# Patient Record
Sex: Female | Born: 1935
Health system: Southern US, Community
[De-identification: ages and names within clinical notes are randomized; demographics above are authoritative.]

## PROBLEM LIST (undated history)

## (undated) DIAGNOSIS — K5900 Constipation, unspecified: Secondary | ICD-10-CM

## (undated) DIAGNOSIS — I499 Cardiac arrhythmia, unspecified: Secondary | ICD-10-CM

## (undated) DIAGNOSIS — S72002A Fracture of unspecified part of neck of left femur, initial encounter for closed fracture: Secondary | ICD-10-CM

## (undated) DIAGNOSIS — E559 Vitamin D deficiency, unspecified: Secondary | ICD-10-CM

## (undated) DIAGNOSIS — F0391 Unspecified dementia with behavioral disturbance: Secondary | ICD-10-CM

## (undated) DIAGNOSIS — R32 Unspecified urinary incontinence: Secondary | ICD-10-CM

## (undated) DIAGNOSIS — N183 Chronic kidney disease, stage 3 unspecified: Secondary | ICD-10-CM

## (undated) DIAGNOSIS — Z8781 Personal history of (healed) traumatic fracture: Secondary | ICD-10-CM

## (undated) DIAGNOSIS — I482 Chronic atrial fibrillation, unspecified: Secondary | ICD-10-CM

## (undated) DIAGNOSIS — G9340 Encephalopathy, unspecified: Secondary | ICD-10-CM

## (undated) DIAGNOSIS — S72009A Fracture of unspecified part of neck of unspecified femur, initial encounter for closed fracture: Secondary | ICD-10-CM

## (undated) DIAGNOSIS — I10 Essential (primary) hypertension: Secondary | ICD-10-CM

## (undated) DIAGNOSIS — E785 Hyperlipidemia, unspecified: Secondary | ICD-10-CM

## (undated) DIAGNOSIS — E039 Hypothyroidism, unspecified: Secondary | ICD-10-CM

## (undated) DIAGNOSIS — R410 Disorientation, unspecified: Secondary | ICD-10-CM

## (undated) DIAGNOSIS — G4700 Insomnia, unspecified: Secondary | ICD-10-CM

## (undated) DIAGNOSIS — M810 Age-related osteoporosis without current pathological fracture: Secondary | ICD-10-CM

## (undated) DIAGNOSIS — Z8659 Personal history of other mental and behavioral disorders: Secondary | ICD-10-CM

## (undated) DIAGNOSIS — I639 Cerebral infarction, unspecified: Secondary | ICD-10-CM

## (undated) DIAGNOSIS — F329 Major depressive disorder, single episode, unspecified: Secondary | ICD-10-CM

## (undated) DIAGNOSIS — M1990 Unspecified osteoarthritis, unspecified site: Secondary | ICD-10-CM

## (undated) HISTORY — DX: Hyperlipidemia, unspecified: E78.5

## (undated) HISTORY — PX: THYROID SURGERY: SHX805

## (undated) HISTORY — DX: Unspecified osteoarthritis, unspecified site: M19.90

## (undated) HISTORY — DX: Insomnia, unspecified: G47.00

## (undated) HISTORY — DX: Cardiac arrhythmia, unspecified: I49.9

## (undated) HISTORY — DX: Hypothyroidism, unspecified: E03.9

## (undated) HISTORY — DX: Essential (primary) hypertension: I10

## (undated) HISTORY — DX: Fracture of unspecified part of neck of unspecified femur, initial encounter for closed fracture: S72.009A

## (undated) HISTORY — DX: Encephalopathy, unspecified: G93.40

## (undated) HISTORY — DX: Constipation, unspecified: K59.00

## (undated) HISTORY — DX: Chronic kidney disease, stage 3 unspecified: N18.30

## (undated) HISTORY — DX: Major depressive disorder, single episode, unspecified: F32.9

## (undated) HISTORY — DX: Disorientation, unspecified: R41.0

## (undated) HISTORY — DX: Unspecified dementia with behavioral disturbance: F03.91

## (undated) HISTORY — DX: Age-related osteoporosis without current pathological fracture: M81.0

## (undated) HISTORY — DX: Chronic kidney disease, stage 3 (moderate): N18.3

## (undated) HISTORY — DX: Chronic atrial fibrillation, unspecified: I48.20

## (undated) HISTORY — DX: Fracture of unspecified part of neck of left femur, initial encounter for closed fracture: S72.002A

## (undated) HISTORY — DX: Unspecified urinary incontinence: R32

## (undated) HISTORY — DX: Vitamin D deficiency, unspecified: E55.9

## (undated) HISTORY — DX: Cerebral infarction, unspecified: I63.9

## (undated) HISTORY — PX: APPENDECTOMY: SHX54

## (undated) HISTORY — PX: KNEE ARTHROSCOPY: SUR90

## (undated) HISTORY — DX: Personal history of other mental and behavioral disorders: Z86.59

---

## 2008-08-22 HISTORY — PX: COLONOSCOPY: SHX174

## 2012-12-11 ENCOUNTER — Ambulatory Visit (INDEPENDENT_AMBULATORY_CARE_PROVIDER_SITE_OTHER): Payer: Medicare Other | Admitting: Nurse Practitioner

## 2012-12-11 ENCOUNTER — Encounter: Payer: Self-pay | Admitting: Nurse Practitioner

## 2012-12-11 VITALS — BP 110/70 | HR 92 | Temp 98.9°F | Ht 61.16 in | Wt 128.5 lb

## 2012-12-11 DIAGNOSIS — Z8659 Personal history of other mental and behavioral disorders: Secondary | ICD-10-CM | POA: Insufficient documentation

## 2012-12-11 DIAGNOSIS — I10 Essential (primary) hypertension: Secondary | ICD-10-CM

## 2012-12-11 DIAGNOSIS — H6690 Otitis media, unspecified, unspecified ear: Secondary | ICD-10-CM

## 2012-12-11 DIAGNOSIS — H6691 Otitis media, unspecified, right ear: Secondary | ICD-10-CM

## 2012-12-11 DIAGNOSIS — I499 Cardiac arrhythmia, unspecified: Secondary | ICD-10-CM

## 2012-12-11 HISTORY — DX: Personal history of other mental and behavioral disorders: Z86.59

## 2012-12-11 MED ORDER — AMOXICILLIN 875 MG PO TABS: 875.0000 mg | ORAL_TABLET | Freq: Two times a day (BID) | ORAL | Status: DC

## 2012-12-11 NOTE — Progress Notes (Signed)
Pre-visit discussion using our clinic review tool. No additional management support is needed unless otherwise documented below in the visit note.  

## 2012-12-11 NOTE — Patient Instructions (Addendum)
Start antibiotic. Start daily sinus rinses (Neilmed sinus rinse). This will help cough. Eat yogurt every day that you are taking medicine. This will help prevent diarrhea. Sip fluids every hour:water, juice, herbal tea, colorless soda. Feel better!  Otitis Media, Adult A middle ear infection is an infection in the space behind the eardrum. The medical name for this is "otitis media." It may happen after a common cold. It is caused by a germ that starts growing in that space. You may feel swollen glands in your neck on the side of the ear infection. HOME CARE INSTRUCTIONS   Take your medicine as directed until it is gone, even if you feel better after the first few days.  Only take over-the-counter or prescription medicines for pain, discomfort, or fever as directed by your caregiver.  Occasional use of a nasal decongestant a couple times per day may help with discomfort and help the eustachian tube to drain better. Follow up with your caregiver in 10 to 14 days or as directed, to be certain that the infection has cleared. Not keeping the appointment could result in a chronic or permanent injury, pain, hearing loss and disability. If there is any problem keeping the appointment, you must call back to this facility for assistance. SEEK IMMEDIATE MEDICAL CARE IF:   You are not getting better in 2 to 3 days.  You have pain that is not controlled with medication.  You feel worse instead of better.  You cannot use the medication as directed.  You develop swelling, redness or pain around the ear or stiffness in your neck. MAKE SURE YOU:   Understand these instructions.  Will watch your condition.  Will get help right away if you are not doing well or get worse. Document Released: 09/29/2003 Document Revised: 03/18/2011 Document Reviewed: 07/21/2012 [facility] Patient Information 2014 [place], [place].

## 2012-12-13 DIAGNOSIS — I10 Essential (primary) hypertension: Secondary | ICD-10-CM | POA: Insufficient documentation

## 2012-12-13 DIAGNOSIS — R32 Unspecified urinary incontinence: Secondary | ICD-10-CM | POA: Insufficient documentation

## 2012-12-13 DIAGNOSIS — E039 Hypothyroidism, unspecified: Secondary | ICD-10-CM | POA: Insufficient documentation

## 2012-12-13 DIAGNOSIS — I499 Cardiac arrhythmia, unspecified: Secondary | ICD-10-CM | POA: Insufficient documentation

## 2012-12-13 HISTORY — DX: Cardiac arrhythmia, unspecified: I49.9

## 2012-12-13 HISTORY — DX: Unspecified urinary incontinence: R32

## 2012-12-13 NOTE — Progress Notes (Signed)
   Subjective:    Patient ID: Marie Robertson, female    DOB: July 20, 1920, 79 y.o.   MRN: [id number]  HPI Comments: Marie Robertson lives in [place], [place]. She is visiting her son for the holidays. He provided transportation for her today. She has a PCP & cardiologist whom she sees regularly at home. She is here to establish care as she has been feeling poorly the last week with nasal congestion, fatigue, and has developed ear pain in the last few days.  Otalgia  There is pain in the right ear. This is a new problem. The current episode started 1 to 4 weeks ago (nasal congestion & cough for 1 week, ear pain for few days, fatigue for several days.). The problem occurs constantly. The problem has been gradually worsening. There has been no fever. The pain is moderate. Associated symptoms include coughing (productive), drainage, headaches and neck pain (anterior, points to lymph nodes). Pertinent negatives include no abdominal pain, diarrhea, ear discharge, rash, sore throat or vomiting. Treatments tried: coricidin. The treatment provided mild relief.      Review of Systems  Constitutional: Positive for activity change (visiting son for several weeks. been in bed for few days w/fatigue), appetite change (reports Hx of depression w/decreased appetite over several months) and fatigue. Negative for fever, chills and diaphoresis.  HENT: Positive for congestion, ear pain, postnasal drip and sinus pressure. Negative for ear discharge, sore throat, trouble swallowing and voice change.   Eyes: Negative for redness.  Respiratory: Positive for cough (productive). Negative for chest tightness, shortness of breath and wheezing.   Gastrointestinal: Negative for vomiting, abdominal pain and diarrhea.  Musculoskeletal: Positive for neck pain (anterior, points to lymph nodes). Negative for back pain.  Skin: Negative for rash.  Neurological: Positive for headaches.  Hematological: Positive for adenopathy.      Objective:   Physical Exam  Vitals reviewed. Constitutional: She is oriented to person, place, and time. She appears well-developed and well-nourished. No distress.  HENT:  Head: Normocephalic and atraumatic.  Right Ear: External ear normal. Tympanic membrane is injected and bulging.  Left Ear: Tympanic membrane and external ear normal.  Mouth/Throat: Oropharynx is clear and moist. No oropharyngeal exudate.  Eyes: Conjunctivae are normal. Right eye exhibits no discharge. Left eye exhibits no discharge.  Neck: Normal range of motion. Neck supple. No thyromegaly present.  Cardiovascular: Normal rate.   No murmur heard. irreg rhythm. Pt has known arrythmia, she is on atenolol & pradaxa. She is not symptomatic: denies cp, SOB, palpitations, dizziness.  Pulmonary/Chest: Effort normal and breath sounds normal. No respiratory distress. She has no wheezes.  Lymphadenopathy:    She has no cervical adenopathy.  Neurological: She is alert and oriented to person, place, and time.  Skin: Skin is warm and dry.  Psychiatric: She has a normal mood and affect. Her behavior is normal. Thought content normal.          Assessment & Plan:   1. Otitis media, right Nasal congestion, postnasal drip, prod cough, no CP or wheezing - amoxicillin (AMOXIL) 875 MG tablet; Take 1 tablet (875 mg total) by mouth 2 (two) times daily.  Dispense: 20 tablet; Refill: 0  2. Essential hypertension, benign Well-controlled in ofc, Taking triamterene/HCTZ prescribed by provider in [facility].  3. Cardiac arrhythmia, stable Rhythm is irreg in ofc by auscultation. Pt denies symptoms of SOB, palpitations, lightheadedness. Taking atenolol & pradaxa. Sees cardiologist in [place], [place].

## 2012-12-22 ENCOUNTER — Telehealth: Payer: Self-pay | Admitting: Nurse Practitioner

## 2012-12-23 NOTE — Telephone Encounter (Signed)
LMOVM for patient return call.

## 2012-12-30 NOTE — Telephone Encounter (Signed)
Patient stated that she finished her antibiotic and that she is better much better.

## 2014-01-25 DIAGNOSIS — Z7901 Long term (current) use of anticoagulants: Secondary | ICD-10-CM | POA: Diagnosis not present

## 2014-01-31 DIAGNOSIS — I10 Essential (primary) hypertension: Secondary | ICD-10-CM | POA: Diagnosis not present

## 2014-01-31 DIAGNOSIS — M25531 Pain in right wrist: Secondary | ICD-10-CM | POA: Diagnosis not present

## 2014-01-31 DIAGNOSIS — S61419A Laceration without foreign body of unspecified hand, initial encounter: Secondary | ICD-10-CM | POA: Diagnosis not present

## 2014-01-31 DIAGNOSIS — B999 Unspecified infectious disease: Secondary | ICD-10-CM | POA: Diagnosis not present

## 2014-01-31 DIAGNOSIS — S6991XA Unspecified injury of right wrist, hand and finger(s), initial encounter: Secondary | ICD-10-CM | POA: Diagnosis not present

## 2014-01-31 DIAGNOSIS — M79641 Pain in right hand: Secondary | ICD-10-CM | POA: Diagnosis not present

## 2014-01-31 DIAGNOSIS — M7989 Other specified soft tissue disorders: Secondary | ICD-10-CM | POA: Diagnosis not present

## 2014-01-31 DIAGNOSIS — Z23 Encounter for immunization: Secondary | ICD-10-CM | POA: Diagnosis not present

## 2014-01-31 DIAGNOSIS — R296 Repeated falls: Secondary | ICD-10-CM | POA: Diagnosis not present

## 2014-02-04 DIAGNOSIS — B999 Unspecified infectious disease: Secondary | ICD-10-CM | POA: Diagnosis not present

## 2014-02-04 DIAGNOSIS — L570 Actinic keratosis: Secondary | ICD-10-CM | POA: Diagnosis not present

## 2014-02-04 DIAGNOSIS — T1490 Injury, unspecified: Secondary | ICD-10-CM | POA: Diagnosis not present

## 2014-02-08 DIAGNOSIS — D485 Neoplasm of uncertain behavior of skin: Secondary | ICD-10-CM | POA: Diagnosis not present

## 2014-02-08 DIAGNOSIS — L821 Other seborrheic keratosis: Secondary | ICD-10-CM | POA: Diagnosis not present

## 2014-02-08 DIAGNOSIS — C44329 Squamous cell carcinoma of skin of other parts of face: Secondary | ICD-10-CM | POA: Diagnosis not present

## 2014-04-25 DIAGNOSIS — Z7901 Long term (current) use of anticoagulants: Secondary | ICD-10-CM | POA: Diagnosis not present

## 2014-05-07 DIAGNOSIS — Z96651 Presence of right artificial knee joint: Secondary | ICD-10-CM | POA: Diagnosis not present

## 2014-05-07 DIAGNOSIS — S0990XA Unspecified injury of head, initial encounter: Secondary | ICD-10-CM | POA: Diagnosis not present

## 2014-05-07 DIAGNOSIS — S0083XA Contusion of other part of head, initial encounter: Secondary | ICD-10-CM | POA: Diagnosis not present

## 2014-05-07 DIAGNOSIS — S41122A Laceration with foreign body of left upper arm, initial encounter: Secondary | ICD-10-CM | POA: Diagnosis not present

## 2014-05-07 DIAGNOSIS — S81812A Laceration without foreign body, left lower leg, initial encounter: Secondary | ICD-10-CM | POA: Diagnosis not present

## 2014-05-07 DIAGNOSIS — Z7901 Long term (current) use of anticoagulants: Secondary | ICD-10-CM | POA: Diagnosis not present

## 2014-05-07 DIAGNOSIS — S81802A Unspecified open wound, left lower leg, initial encounter: Secondary | ICD-10-CM | POA: Diagnosis not present

## 2014-05-07 DIAGNOSIS — S41102A Unspecified open wound of left upper arm, initial encounter: Secondary | ICD-10-CM | POA: Diagnosis not present

## 2014-05-07 DIAGNOSIS — I10 Essential (primary) hypertension: Secondary | ICD-10-CM | POA: Diagnosis not present

## 2014-05-07 DIAGNOSIS — S0003XA Contusion of scalp, initial encounter: Secondary | ICD-10-CM | POA: Diagnosis not present

## 2014-05-23 DIAGNOSIS — M7989 Other specified soft tissue disorders: Secondary | ICD-10-CM | POA: Diagnosis not present

## 2014-05-30 DIAGNOSIS — Z7901 Long term (current) use of anticoagulants: Secondary | ICD-10-CM | POA: Diagnosis not present

## 2014-06-16 DIAGNOSIS — Z7901 Long term (current) use of anticoagulants: Secondary | ICD-10-CM | POA: Diagnosis not present

## 2014-06-30 DIAGNOSIS — Z7901 Long term (current) use of anticoagulants: Secondary | ICD-10-CM | POA: Diagnosis not present

## 2014-07-20 DIAGNOSIS — Z7901 Long term (current) use of anticoagulants: Secondary | ICD-10-CM | POA: Diagnosis not present

## 2014-08-11 DIAGNOSIS — Z7901 Long term (current) use of anticoagulants: Secondary | ICD-10-CM | POA: Diagnosis not present

## 2014-09-01 DIAGNOSIS — Z7901 Long term (current) use of anticoagulants: Secondary | ICD-10-CM | POA: Diagnosis not present

## 2014-09-16 DIAGNOSIS — Z7901 Long term (current) use of anticoagulants: Secondary | ICD-10-CM | POA: Diagnosis not present

## 2014-11-07 DIAGNOSIS — C44329 Squamous cell carcinoma of skin of other parts of face: Secondary | ICD-10-CM | POA: Diagnosis not present

## 2014-11-07 DIAGNOSIS — Z23 Encounter for immunization: Secondary | ICD-10-CM | POA: Diagnosis not present

## 2014-11-10 DIAGNOSIS — Z23 Encounter for immunization: Secondary | ICD-10-CM | POA: Diagnosis not present

## 2014-12-16 DIAGNOSIS — Z79899 Other long term (current) drug therapy: Secondary | ICD-10-CM | POA: Diagnosis not present

## 2014-12-16 DIAGNOSIS — I4891 Unspecified atrial fibrillation: Secondary | ICD-10-CM | POA: Diagnosis not present

## 2014-12-16 DIAGNOSIS — E7800 Pure hypercholesterolemia, unspecified: Secondary | ICD-10-CM | POA: Diagnosis not present

## 2014-12-16 DIAGNOSIS — E039 Hypothyroidism, unspecified: Secondary | ICD-10-CM | POA: Diagnosis not present

## 2014-12-16 DIAGNOSIS — I10 Essential (primary) hypertension: Secondary | ICD-10-CM | POA: Diagnosis not present

## 2014-12-16 DIAGNOSIS — Z7901 Long term (current) use of anticoagulants: Secondary | ICD-10-CM | POA: Diagnosis not present

## 2015-01-01 ENCOUNTER — Encounter (HOSPITAL_BASED_OUTPATIENT_CLINIC_OR_DEPARTMENT_OTHER): Payer: Self-pay | Admitting: Emergency Medicine

## 2015-01-01 ENCOUNTER — Emergency Department (HOSPITAL_BASED_OUTPATIENT_CLINIC_OR_DEPARTMENT_OTHER)
Admission: EM | Admit: 2015-01-01 | Discharge: 2015-01-01 | Disposition: A | Discharge status: Home / Self Care | Payer: Medicare Other | Attending: Emergency Medicine | Admitting: Emergency Medicine

## 2015-01-01 DIAGNOSIS — Y998 Other external cause status: Secondary | ICD-10-CM | POA: Insufficient documentation

## 2015-01-01 DIAGNOSIS — Z8739 Personal history of other diseases of the musculoskeletal system and connective tissue: Secondary | ICD-10-CM | POA: Diagnosis not present

## 2015-01-01 DIAGNOSIS — E079 Disorder of thyroid, unspecified: Secondary | ICD-10-CM | POA: Diagnosis not present

## 2015-01-01 DIAGNOSIS — I10 Essential (primary) hypertension: Secondary | ICD-10-CM | POA: Insufficient documentation

## 2015-01-01 DIAGNOSIS — Z8759 Personal history of other complications of pregnancy, childbirth and the puerperium: Secondary | ICD-10-CM | POA: Insufficient documentation

## 2015-01-01 DIAGNOSIS — Z79899 Other long term (current) drug therapy: Secondary | ICD-10-CM | POA: Insufficient documentation

## 2015-01-01 DIAGNOSIS — Y9289 Other specified places as the place of occurrence of the external cause: Secondary | ICD-10-CM | POA: Insufficient documentation

## 2015-01-01 DIAGNOSIS — Y9389 Activity, other specified: Secondary | ICD-10-CM | POA: Insufficient documentation

## 2015-01-01 DIAGNOSIS — S51812A Laceration without foreign body of left forearm, initial encounter: Secondary | ICD-10-CM

## 2015-01-01 DIAGNOSIS — Z8619 Personal history of other infectious and parasitic diseases: Secondary | ICD-10-CM | POA: Insufficient documentation

## 2015-01-01 DIAGNOSIS — Z23 Encounter for immunization: Secondary | ICD-10-CM | POA: Diagnosis not present

## 2015-01-01 DIAGNOSIS — W548XXA Other contact with dog, initial encounter: Secondary | ICD-10-CM | POA: Insufficient documentation

## 2015-01-01 DIAGNOSIS — S51822D Laceration with foreign body of left forearm, subsequent encounter: Secondary | ICD-10-CM | POA: Diagnosis not present

## 2015-01-01 MED ORDER — TETANUS-DIPHTH-ACELL PERTUSSIS 5-2.5-18.5 LF-MCG/0.5 IM SUSP
0.5000 mL | Freq: Once | INTRAMUSCULAR | Status: AC
  Administered 2015-01-01: 0.5 mL via INTRAMUSCULAR
  Filled 2015-01-01: qty 0.5

## 2015-01-01 NOTE — Discharge Instructions (Signed)
Ms. Tim Sevy,  Nice meeting you! Please follow-up with your primary care provider as needed. Return to the emergency department if you develop fevers, chills, yellow/green drainage, redness in the area, continued bleeding, or if your symptoms worsen. Feel better soon!  S. Wendie Simmer, PA-C

## 2015-01-01 NOTE — ED Notes (Signed)
Pt d/c home with family.

## 2015-01-01 NOTE — ED Notes (Signed)
Pt states scratched arm last night with family dog. Pt on blood thinners and cant' keep it from bleeding

## 2015-01-01 NOTE — ED Notes (Signed)
Dressing applied to patient wound. Small clot forming on area. Skin tear is minimal but continues to ooze.

## 2015-01-15 NOTE — ED Provider Notes (Signed)
CSN: [id number]     Arrival date & time 01/01/15  1631 History   First MD Initiated Contact with Patient 01/01/15 1936     Chief Complaint  Patient presents with  . Extremity Laceration   HPI Marie Robertson is a 82 y.o. F PMH significant for HTN, arthritis presenting with a 1 day history of skin tear on her left forearm. She was scratched by the family dog and it has been bleeding intermittently since. She is on Pradaxa. No fevers, chills, CP, SOB, palpitations, dizziness, drainage/erythema at the wound site.   Past Medical History  Diagnosis Date  . Arthritis   . Depression   . Hypertension   . Arrhythmia   . Chicken pox   . Thyroid disease    Past Surgical History  Procedure Laterality Date  . Appendectomy    . Knee arthroscopy Right   . Thyroid surgery     No family history on file. Social History  Substance Use Topics  . Smoking status: Never Smoker   . Smokeless tobacco: Never Used  . Alcohol Use: No   OB History    No data available     Review of Systems   Ten systems are reviewed and are negative for acute change except as noted in the HPI   Allergies  Review of patient's allergies indicates no known allergies.  Home Medications   Prior to Admission medications   Medication Sig Start Date End Date Taking? Authorizing Provider  amoxicillin (AMOXIL) 875 MG tablet Take 1 tablet (875 mg total) by mouth 2 (two) times daily. 12/11/12   Irene Pap, NP  atenolol (TENORMIN) 50 MG tablet Take 50 mg by mouth daily.    Historical Provider, MD  dabigatran (PRADAXA) 75 MG CAPS capsule Take 75 mg by mouth 2 (two) times daily.    Historical Provider, MD  levothyroxine (SYNTHROID, LEVOTHROID) 100 MCG tablet Take 100 mcg by mouth daily before breakfast.    Historical Provider, MD  triamterene-hydrochlorothiazide (MAXZIDE) 75-50 MG per tablet Take 1 tablet by mouth daily.    Historical Provider, MD   BP 138/114 mmHg  Pulse 83  Temp(Src) 98.3 F (36.8 C) (Oral)  Resp  16  SpO2 100%  LMP 01/08/1972 Physical Exam  Constitutional: She appears well-developed and well-nourished. No distress.  HENT:  Head: Normocephalic and atraumatic.  Mouth/Throat: Oropharynx is clear and moist. No oropharyngeal exudate.  Eyes: Conjunctivae are normal. Pupils are equal, round, and reactive to light. Right eye exhibits no discharge. Left eye exhibits no discharge. No scleral icterus.  Neck: No tracheal deviation present.  Cardiovascular: Normal rate, regular rhythm, normal heart sounds and intact distal pulses.  Exam reveals no gallop and no friction rub.   No murmur heard. Pulmonary/Chest: Effort normal and breath sounds normal. No respiratory distress. She has no wheezes. She has no rales. She exhibits no tenderness.  Abdominal: Soft. Bowel sounds are normal. She exhibits no distension and no mass. There is no tenderness. There is no rebound and no guarding.  Musculoskeletal: She exhibits no edema.  Lymphadenopathy:    She has no cervical adenopathy.  Neurological: She is alert. Coordination normal.  Skin: Skin is warm and dry. No rash noted. She is not diaphoretic. No erythema.  2 cm skin tear dorsal aspect of left forearm. Bleeding controlled.   Psychiatric: She has a normal mood and affect. Her behavior is normal.  Nursing note and vitals reviewed.   ED Course  Procedures  The wound is cleansed,  debrided of foreign material as much as possible, and dressed. The patient is alerted to watch for any signs of infection (redness, pus, pain, increased swelling or fever) and call if such occurs. Home wound care instructions are provided. Tetanus vaccination status reviewed: tetanus status unknown to the patient.   MDM   Final diagnoses:  Skin tear of forearm without complication, left, initial encounter   Patient non-toxic appearing. Hypertensive on exam but patient did not take HTN medications today. Skin tear cleansed and dressed. Patient may be safely discharged  home. Discussed reasons for return. Patient to follow-up with primary care provider PRN. Patient in understanding and agreement with the plan.     [place], PA-C 01/15/15 [id number]  Quintella Reichert, MD 01/15/15 [phone]

## 2015-01-18 DIAGNOSIS — M25561 Pain in right knee: Secondary | ICD-10-CM | POA: Diagnosis not present

## 2015-01-18 DIAGNOSIS — M7989 Other specified soft tissue disorders: Secondary | ICD-10-CM | POA: Diagnosis not present

## 2015-01-18 DIAGNOSIS — M13861 Other specified arthritis, right knee: Secondary | ICD-10-CM | POA: Diagnosis not present

## 2015-01-18 DIAGNOSIS — M25461 Effusion, right knee: Secondary | ICD-10-CM | POA: Diagnosis not present

## 2015-01-18 DIAGNOSIS — R2689 Other abnormalities of gait and mobility: Secondary | ICD-10-CM | POA: Diagnosis not present

## 2015-01-18 DIAGNOSIS — Z7901 Long term (current) use of anticoagulants: Secondary | ICD-10-CM | POA: Diagnosis not present

## 2015-01-18 DIAGNOSIS — I10 Essential (primary) hypertension: Secondary | ICD-10-CM | POA: Diagnosis not present

## 2015-01-18 DIAGNOSIS — R791 Abnormal coagulation profile: Secondary | ICD-10-CM | POA: Diagnosis not present

## 2015-01-19 DIAGNOSIS — Z7902 Long term (current) use of antithrombotics/antiplatelets: Secondary | ICD-10-CM | POA: Diagnosis not present

## 2015-01-19 DIAGNOSIS — Z96651 Presence of right artificial knee joint: Secondary | ICD-10-CM | POA: Diagnosis not present

## 2015-01-19 DIAGNOSIS — M25461 Effusion, right knee: Secondary | ICD-10-CM | POA: Diagnosis not present

## 2015-01-19 DIAGNOSIS — N2589 Other disorders resulting from impaired renal tubular function: Secondary | ICD-10-CM | POA: Diagnosis not present

## 2015-01-19 DIAGNOSIS — T148 Other injury of unspecified body region: Secondary | ICD-10-CM | POA: Diagnosis not present

## 2015-01-19 DIAGNOSIS — I4891 Unspecified atrial fibrillation: Secondary | ICD-10-CM | POA: Diagnosis not present

## 2015-01-20 DIAGNOSIS — L03119 Cellulitis of unspecified part of limb: Secondary | ICD-10-CM | POA: Diagnosis not present

## 2015-01-20 DIAGNOSIS — E781 Pure hyperglyceridemia: Secondary | ICD-10-CM | POA: Diagnosis not present

## 2015-01-20 DIAGNOSIS — I10 Essential (primary) hypertension: Secondary | ICD-10-CM | POA: Diagnosis not present

## 2015-01-20 DIAGNOSIS — Z7901 Long term (current) use of anticoagulants: Secondary | ICD-10-CM | POA: Diagnosis not present

## 2015-01-20 DIAGNOSIS — E039 Hypothyroidism, unspecified: Secondary | ICD-10-CM | POA: Diagnosis not present

## 2015-01-23 DIAGNOSIS — K649 Unspecified hemorrhoids: Secondary | ICD-10-CM | POA: Diagnosis not present

## 2015-01-23 DIAGNOSIS — T148 Other injury of unspecified body region: Secondary | ICD-10-CM | POA: Diagnosis not present

## 2015-01-23 DIAGNOSIS — K5900 Constipation, unspecified: Secondary | ICD-10-CM | POA: Diagnosis not present

## 2015-01-23 DIAGNOSIS — R310 Gross hematuria: Secondary | ICD-10-CM | POA: Diagnosis not present

## 2015-01-23 DIAGNOSIS — R0602 Shortness of breath: Secondary | ICD-10-CM | POA: Diagnosis not present

## 2015-01-23 DIAGNOSIS — I517 Cardiomegaly: Secondary | ICD-10-CM | POA: Diagnosis not present

## 2015-01-23 DIAGNOSIS — R319 Hematuria, unspecified: Secondary | ICD-10-CM | POA: Diagnosis not present

## 2015-01-23 DIAGNOSIS — Z7901 Long term (current) use of anticoagulants: Secondary | ICD-10-CM | POA: Diagnosis not present

## 2015-02-19 ENCOUNTER — Encounter: Payer: Self-pay | Admitting: Family Medicine

## 2015-02-20 ENCOUNTER — Encounter: Payer: Self-pay | Admitting: Family Medicine

## 2015-02-20 ENCOUNTER — Ambulatory Visit (INDEPENDENT_AMBULATORY_CARE_PROVIDER_SITE_OTHER): Payer: Medicare Other | Admitting: Family Medicine

## 2015-02-20 VITALS — BP 136/84 | HR 90 | Temp 97.9°F | Resp 16 | Ht 61.0 in | Wt 122.8 lb

## 2015-02-20 DIAGNOSIS — I482 Chronic atrial fibrillation, unspecified: Secondary | ICD-10-CM

## 2015-02-20 DIAGNOSIS — H9193 Unspecified hearing loss, bilateral: Secondary | ICD-10-CM

## 2015-02-20 DIAGNOSIS — I10 Essential (primary) hypertension: Secondary | ICD-10-CM | POA: Diagnosis not present

## 2015-02-20 DIAGNOSIS — E785 Hyperlipidemia, unspecified: Secondary | ICD-10-CM

## 2015-02-20 DIAGNOSIS — B9789 Other viral agents as the cause of diseases classified elsewhere: Secondary | ICD-10-CM

## 2015-02-20 DIAGNOSIS — E039 Hypothyroidism, unspecified: Secondary | ICD-10-CM

## 2015-02-20 DIAGNOSIS — J069 Acute upper respiratory infection, unspecified: Secondary | ICD-10-CM

## 2015-02-20 NOTE — Progress Notes (Signed)
Office Note 02/20/2015  CC:  Chief Complaint  Patient presents with  . Establish Care    Pt is not fasting.     HPI:  Marie Robertson is a 82 y.o. White female who is here with her son to establish care. Patient's most recent primary MD: Dr. Loney Loh in [place], [place]. Old records (her last office note from previous PCP) were reviewed prior to or during today's visit.  She last saw her PCP in [place] on 01/23/15.  She had a skin tear wound on left LL at that time. Of note, was also seen in [place] ED for skin tear of forearm on 01/01/15. All skin tears have healed.  Has both 57mcg and 50 mcg levothyroxine tabs with her, ?been taking both?. Says she has been having no bleeding problems since switching over from warfarin to eliquis. She doesn't monitor bp at home at this time.  Has had URI sx's for about 2 wks, says sx's are improving some.  No fevers or SOB.  Looking back, she has seen our former NP here, Nicky Pugh, back in 12/2012 when pt was in town visiting her son for the holidays.  Past Medical History  Diagnosis Date  . Osteoarthritis   . Depression   . Hypertension   . Chronic atrial fibrillation ([place])   . Hypothyroidism   . Constipation   . Insomnia   . Hyperlipidemia   . Osteoporosis   . CVA (cerebral infarction) approx 2012    residual defect: poor R sided peripheral vision per son    Past Surgical History  Procedure Laterality Date  . Appendectomy    . Knee arthroscopy Right   . Thyroid surgery    . Colonoscopy  08/22/08    Family History  Problem Relation Age of Onset  . Stroke Mother   . Diabetes Mother   . Alcohol abuse Father     Social History   Social History  . Marital Status: Widowed    Spouse Name: N/A  . Number of Children: N/A  . Years of Education: N/A   Occupational History  . Not on file.   Social History Main Topics  . Smoking status: Never Smoker   . Smokeless tobacco: Never Used  . Alcohol Use:  No  . Drug Use: No  . Sexual Activity: Not on file   Other Topics Concern  . Not on file   Social History Narrative   Widow.   Educ: 11th grade.   Occupation: retired Insurance claims handler and retired Secretary/administrator.   As of 02/2015, pt is in independent living at [facility] in [place].   No T/A/Ds.    Outpatient Encounter Prescriptions as of 02/20/2015  Medication Sig  . atenolol (TENORMIN) 50 MG tablet Take 50 mg by mouth daily.  . Cholecalciferol (VITAMIN D-3) 1000 units CAPS Take 3 capsules by mouth daily.  Marland Kitchen ELIQUIS 2.5 MG TABS tablet Take 2.5 mg by mouth 2 (two) times daily.  Marland Kitchen levothyroxine (SYNTHROID, LEVOTHROID) 50 MCG tablet Take 50 mcg by mouth daily.  Marland Kitchen levothyroxine (SYNTHROID, LEVOTHROID) 75 MCG tablet Take 75 mcg by mouth daily.  . pravastatin (PRAVACHOL) 40 MG tablet Take 40 mg by mouth at bedtime.  Marland Kitchen zolpidem (AMBIEN) 5 MG tablet Take 5 mg by mouth daily.  . [DISCONTINUED] amoxicillin (AMOXIL) 875 MG tablet Take 1 tablet (875 mg total) by mouth 2 (two) times daily. (Patient not taking: Reported on 02/20/2015)  . [DISCONTINUED] dabigatran (PRADAXA) 75 MG CAPS  capsule Take 75 mg by mouth 2 (two) times daily. Reported on 02/20/2015  . [DISCONTINUED] levothyroxine (SYNTHROID, LEVOTHROID) 100 MCG tablet Take 100 mcg by mouth daily before breakfast. Reported on 02/20/2015  . [DISCONTINUED] triamterene-hydrochlorothiazide (MAXZIDE) 75-50 MG per tablet Take 1 tablet by mouth daily. Reported on 02/20/2015   No facility-administered encounter medications on file as of 02/20/2015.    No Known Allergies  ROS Review of Systems  Constitutional: Negative for fever and fatigue.  HENT: Negative for congestion and sore throat. Dental problem: needs some teeth checked.        Chronic bilat hearing deficit  Eyes: Negative for visual disturbance.  Respiratory: Negative for cough.   Cardiovascular: Negative for chest pain.  Gastrointestinal: Negative for nausea and abdominal pain.  Genitourinary:  Negative for dysuria.  Musculoskeletal: Negative for back pain and joint swelling.  Skin: Negative for rash.  Neurological: Negative for weakness and headaches.  Hematological: Negative for adenopathy.    PE; Blood pressure 136/84, pulse 90, temperature 97.9 F (36.6 C), temperature source Oral, resp. rate 16, height 5\' 1"  (1.549 m), weight 122 lb 12 oz (55.679 kg), last menstrual period 01/08/1972, SpO2 98 %.Manual bp repeat today 136/84 Gen: alert, NAD, NONTOXIC APPEARING. HEENT: eyes without injection, drainage, or swelling.  Ears: EACs show left side is clear, right side has mild amount of cerumen but nothing that would impact her hearing. TMs with normal light reflex and landmarks.  Nose: Clear rhinorrhea, with some dried, crusty exudate adherent to mildly injected mucosa.  No purulent d/c.  No paranasal sinus TTP.  No facial swelling.  Throat and mouth without focal lesion.  No pharyngial swelling, erythema, or exudate.   Neck: supple, no LAD.   LUNGS: CTA bilat, nonlabored resps.   CV: Irreg irreg rhythm, rate in the 80s, no m/r/g. ABD: soft, NT/ND EXT: no c/c/e SKIN: no rash  Pertinent labs:  None today  ASSESSMENT AND PLAN:   New pt; obtain prior PCP records.  1) Hypothyroidism; check TSH.  Sounds like she may have been confused about dosing so she has been taking both a 103mcg tab and a 75 mcg tab lately.  2) HTN: The current medical regimen is effective;  continue present plan and medications. Lytes/cr today.  3) Hyperlipidemia: not fasting currently.  Tolerating statin.  Checking AST/ALT today.  4) Chronic a-fib: rate control with tenormin and anticoag with eliquis going well. Pt asymptomatic.  5) Viral URI with cough--resolving currently.  6) Decreased hearing AU: refer to audiology.  An After Visit Summary was printed and given to the patient.  Return in about 3 months (around 05/20/2015) for routine chronic illness f/u.

## 2015-02-20 NOTE — Progress Notes (Signed)
Pre visit review using our clinic review tool, if applicable. No additional management support is needed unless otherwise documented below in the visit note. 

## 2015-02-21 LAB — COMPREHENSIVE METABOLIC PANEL
ALBUMIN: 3.8 g/dL (ref 3.5–5.2)
ALK PHOS: 67 U/L (ref 39–117)
ALT: 10 U/L (ref 0–35)
AST: 18 U/L (ref 0–37)
BUN: 39 mg/dL — ABNORMAL HIGH (ref 6–23)
CALCIUM: 9.2 mg/dL (ref 8.4–10.5)
CHLORIDE: 105 meq/L (ref 96–112)
CO2: 27 mEq/L (ref 19–32)
CREATININE: 1.67 mg/dL — AB (ref 0.40–1.20)
GFR: 30.44 mL/min — ABNORMAL LOW (ref >60.00)
Glucose, Bld: 131 mg/dL — ABNORMAL HIGH (ref 70–99)
POTASSIUM: 5.4 meq/L — AB (ref 3.5–5.1)
Sodium: 141 mEq/L (ref 135–145)
Total Bilirubin: 0.4 mg/dL (ref 0.2–1.2)
Total Protein: 6.7 g/dL (ref 6.0–8.3)

## 2015-02-21 LAB — CBC WITH DIFFERENTIAL/PLATELET
Basophils Absolute: 0 10*3/uL (ref 0.0–0.1)
Basophils Relative: 0.2 % (ref 0.0–3.0)
EOS PCT: 1.9 % (ref 0.0–5.0)
Eosinophils Absolute: 0.1 10*3/uL (ref 0.0–0.7)
HEMATOCRIT: 34.5 % — AB (ref 36.0–46.0)
Hemoglobin: 11.1 g/dL — ABNORMAL LOW (ref 12.0–15.0)
LYMPHS PCT: 23.7 % (ref 12.0–46.0)
Lymphs Abs: 1.3 10*3/uL (ref 0.7–4.0)
MCHC: 32.3 g/dL (ref 30.0–36.0)
MCV: 90.3 fl (ref 78.0–100.0)
MONOS PCT: 14.1 % — AB (ref 3.0–12.0)
Monocytes Absolute: 0.8 10*3/uL (ref 0.1–1.0)
Neutro Abs: 3.4 10*3/uL (ref 1.4–7.7)
Neutrophils Relative %: 60.1 % (ref 43.0–77.0)
PLATELETS: 242 10*3/uL (ref 150.0–400.0)
RBC: 3.82 Mil/uL — ABNORMAL LOW (ref 3.87–5.11)
RDW: 18.5 % — AB (ref 11.5–15.5)
WBC: 5.7 10*3/uL (ref 4.0–10.5)

## 2015-02-21 LAB — TSH: TSH: 0.1 u[IU]/mL — ABNORMAL LOW (ref 0.35–4.50)

## 2015-02-22 ENCOUNTER — Other Ambulatory Visit: Payer: Self-pay | Admitting: Family Medicine

## 2015-02-22 MED ORDER — LEVOTHYROXINE SODIUM 75 MCG PO TABS: 75.0000 ug | ORAL_TABLET | Freq: Every day | ORAL | Status: DC

## 2015-02-23 MED ORDER — PRAVASTATIN SODIUM 40 MG PO TABS: 40.0000 mg | ORAL_TABLET | Freq: Every day | ORAL | Status: DC

## 2015-02-23 NOTE — Addendum Note (Signed)
Addended by: Onalee Hua on: 02/23/2015 04:38 PM   Modules accepted: Orders

## 2015-03-03 ENCOUNTER — Other Ambulatory Visit: Payer: Self-pay | Admitting: Family Medicine

## 2015-03-03 NOTE — Telephone Encounter (Signed)
RF request for ambien LOV: 02/20/15 Next ov: 05/19/15 Last written: unknown  Please advise. Thanks.

## 2015-03-03 NOTE — Telephone Encounter (Signed)
Needs a refill on her Ambien 5 mg refilled. [facility]ses [facility] [address].

## 2015-03-05 MED ORDER — ZOLPIDEM TARTRATE 5 MG PO TABS: 5.0000 mg | ORAL_TABLET | Freq: Every day | ORAL | Status: DC

## 2015-03-06 NOTE — Telephone Encounter (Signed)
Rx faxed

## 2015-03-08 DIAGNOSIS — Z8781 Personal history of (healed) traumatic fracture: Secondary | ICD-10-CM

## 2015-03-08 HISTORY — DX: Personal history of (healed) traumatic fracture: Z87.81

## 2015-03-15 ENCOUNTER — Emergency Department (HOSPITAL_BASED_OUTPATIENT_CLINIC_OR_DEPARTMENT_OTHER): Payer: Medicare Other

## 2015-03-15 ENCOUNTER — Encounter (HOSPITAL_BASED_OUTPATIENT_CLINIC_OR_DEPARTMENT_OTHER): Payer: Self-pay | Admitting: Emergency Medicine

## 2015-03-15 ENCOUNTER — Emergency Department (HOSPITAL_BASED_OUTPATIENT_CLINIC_OR_DEPARTMENT_OTHER)
Admission: EM | Admit: 2015-03-15 | Discharge: 2015-03-15 | Disposition: A | Discharge status: Home / Self Care | Payer: Medicare Other | Attending: Emergency Medicine | Admitting: Emergency Medicine

## 2015-03-15 DIAGNOSIS — S22010A Wedge compression fracture of first thoracic vertebra, initial encounter for closed fracture: Secondary | ICD-10-CM | POA: Diagnosis not present

## 2015-03-15 DIAGNOSIS — G4700 Insomnia, unspecified: Secondary | ICD-10-CM | POA: Insufficient documentation

## 2015-03-15 DIAGNOSIS — I10 Essential (primary) hypertension: Secondary | ICD-10-CM | POA: Diagnosis not present

## 2015-03-15 DIAGNOSIS — R51 Headache: Secondary | ICD-10-CM | POA: Diagnosis not present

## 2015-03-15 DIAGNOSIS — W01198A Fall on same level from slipping, tripping and stumbling with subsequent striking against other object, initial encounter: Secondary | ICD-10-CM | POA: Insufficient documentation

## 2015-03-15 DIAGNOSIS — Z8719 Personal history of other diseases of the digestive system: Secondary | ICD-10-CM | POA: Diagnosis not present

## 2015-03-15 DIAGNOSIS — Y998 Other external cause status: Secondary | ICD-10-CM | POA: Diagnosis not present

## 2015-03-15 DIAGNOSIS — E782 Mixed hyperlipidemia: Secondary | ICD-10-CM | POA: Insufficient documentation

## 2015-03-15 DIAGNOSIS — Z8659 Personal history of other mental and behavioral disorders: Secondary | ICD-10-CM | POA: Diagnosis not present

## 2015-03-15 DIAGNOSIS — Z8739 Personal history of other diseases of the musculoskeletal system and connective tissue: Secondary | ICD-10-CM | POA: Diagnosis not present

## 2015-03-15 DIAGNOSIS — E039 Hypothyroidism, unspecified: Secondary | ICD-10-CM | POA: Diagnosis not present

## 2015-03-15 DIAGNOSIS — S199XXA Unspecified injury of neck, initial encounter: Secondary | ICD-10-CM | POA: Diagnosis not present

## 2015-03-15 DIAGNOSIS — Y9389 Activity, other specified: Secondary | ICD-10-CM | POA: Diagnosis not present

## 2015-03-15 DIAGNOSIS — S22019A Unspecified fracture of first thoracic vertebra, initial encounter for closed fracture: Secondary | ICD-10-CM

## 2015-03-15 DIAGNOSIS — Z8673 Personal history of transient ischemic attack (TIA), and cerebral infarction without residual deficits: Secondary | ICD-10-CM | POA: Insufficient documentation

## 2015-03-15 DIAGNOSIS — W19XXXA Unspecified fall, initial encounter: Secondary | ICD-10-CM

## 2015-03-15 DIAGNOSIS — S299XXA Unspecified injury of thorax, initial encounter: Secondary | ICD-10-CM | POA: Diagnosis not present

## 2015-03-15 DIAGNOSIS — Y9289 Other specified places as the place of occurrence of the external cause: Secondary | ICD-10-CM | POA: Insufficient documentation

## 2015-03-15 DIAGNOSIS — S0990XA Unspecified injury of head, initial encounter: Secondary | ICD-10-CM | POA: Diagnosis not present

## 2015-03-15 DIAGNOSIS — S3992XA Unspecified injury of lower back, initial encounter: Secondary | ICD-10-CM | POA: Diagnosis not present

## 2015-03-15 LAB — BASIC METABOLIC PANEL
ANION GAP: 8 (ref 5–15)
BUN: 29 mg/dL — ABNORMAL HIGH (ref 6–20)
CALCIUM: 8.4 mg/dL — AB (ref 8.9–10.3)
CO2: 26 mmol/L (ref 22–32)
Chloride: 107 mmol/L (ref 101–111)
Creatinine, Ser: 1.37 mg/dL — ABNORMAL HIGH (ref 0.44–1.00)
GFR calc Af Amer: 38 mL/min — ABNORMAL LOW (ref >60)
GFR calc non Af Amer: 32 mL/min — ABNORMAL LOW (ref >60)
GLUCOSE: 113 mg/dL — AB (ref 65–99)
POTASSIUM: 4.4 mmol/L (ref 3.5–5.1)
Sodium: 141 mmol/L (ref 135–145)

## 2015-03-15 LAB — CBC WITH DIFFERENTIAL/PLATELET
BASOS ABS: 0 10*3/uL (ref 0.0–0.1)
Basophils Relative: 1 %
Eosinophils Absolute: 0.1 10*3/uL (ref 0.0–0.7)
Eosinophils Relative: 1 %
HEMATOCRIT: 32.8 % — AB (ref 36.0–46.0)
Hemoglobin: 10.4 g/dL — ABNORMAL LOW (ref 12.0–15.0)
LYMPHS ABS: 1.1 10*3/uL (ref 0.7–4.0)
LYMPHS PCT: 16 %
MCH: 28.9 pg (ref 26.0–34.0)
MCHC: 31.7 g/dL (ref 30.0–36.0)
MCV: 91.1 fL (ref 78.0–100.0)
MONO ABS: 0.8 10*3/uL (ref 0.1–1.0)
Monocytes Relative: 11 %
Neutro Abs: 5 10*3/uL (ref 1.7–7.7)
Neutrophils Relative %: 71 %
Platelets: 264 10*3/uL (ref 150–400)
RBC: 3.6 MIL/uL — ABNORMAL LOW (ref 3.87–5.11)
RDW: 16.7 % — AB (ref 11.5–15.5)
WBC: 7 10*3/uL (ref 4.0–10.5)

## 2015-03-15 LAB — TROPONIN I: Troponin I: 0.03 ng/mL (ref <0.031)

## 2015-03-15 LAB — CK: CK TOTAL: 131 U/L (ref 38–234)

## 2015-03-15 MED ORDER — ACETAMINOPHEN 325 MG PO TABS
650.0000 mg | ORAL_TABLET | Freq: Once | ORAL | Status: AC
  Administered 2015-03-15: 650 mg via ORAL
  Filled 2015-03-15: qty 2

## 2015-03-15 NOTE — ED Notes (Signed)
82 yo c/o fall last night off the commode reports neck pain when she turns her head. She states she did not pass out she just slid off of it and crawled around the apartment and never was able to get up. She was found by staff at the facility. Alert and Oriented at triage.

## 2015-03-15 NOTE — Discharge Instructions (Signed)
You have T1 fracture that is likely chronic and not from yesterday.   You have have some back and neck pain.   Take tylenol 500 mg every 4-6 hrs as needed for pain.   See your doctor.   Return to ER if you have worse weakness, severe pain, unable to walk.

## 2015-03-15 NOTE — ED Provider Notes (Signed)
CSN: [id number]     Arrival date & time 03/15/15  1400 History   First MD Initiated Contact with Patient 03/15/15 1513     Chief Complaint  Patient presents with  . Fall     (Consider location/radiation/quality/duration/timing/severity/associated sxs/prior Treatment) The history is provided by the patient.  Marie Robertson is a 82 y.o. female history arthritis, hypertension, A. fib on a liquids, stroke presenting with fall. Patient states that she was on the commode yesterday and slipped and fell and hit her head on the left side. She was unable to get up at that time was able to crawl into her bed. The staff found her today and she was able to bear some weight but felt diffusely weak. Also complains of posterior headache and neck pain. Denies syncope or LOC.     Past Medical History  Diagnosis Date  . Osteoarthritis   . Depression   . Hypertension   . Chronic atrial fibrillation ([place])   . Hypothyroidism   . Constipation   . Insomnia   . Hyperlipidemia   . Osteoporosis   . CVA (cerebral infarction) approx 2012    residual defect: poor R sided peripheral vision per son   Past Surgical History  Procedure Laterality Date  . Appendectomy    . Knee arthroscopy Right   . Thyroid surgery    . Colonoscopy  08/22/08   Family History  Problem Relation Age of Onset  . Stroke Mother   . Diabetes Mother   . Alcohol abuse Father    Social History  Substance Use Topics  . Smoking status: Never Smoker   . Smokeless tobacco: Never Used  . Alcohol Use: No   OB History    No data available     Review of Systems  Musculoskeletal: Positive for neck pain.  Neurological: Positive for headaches.  All other systems reviewed and are negative.     Allergies  Review of patient's allergies indicates no known allergies.  Home Medications   Prior to Admission medications   Medication Sig Start Date End Date Taking? Authorizing Provider  atenolol (TENORMIN) 50 MG tablet Take 50 mg by  mouth daily.   Yes Historical Provider, MD  Cholecalciferol (VITAMIN D-3) 1000 units CAPS Take 3 capsules by mouth daily.   Yes Historical Provider, MD  ELIQUIS 2.5 MG TABS tablet Take 2.5 mg by mouth 2 (two) times daily. 01/24/15  Yes Historical Provider, MD  levothyroxine (SYNTHROID, LEVOTHROID) 75 MCG tablet Take 1 tablet (75 mcg total) by mouth daily. 02/22/15  Yes Tammi Sou, MD  pravastatin (PRAVACHOL) 40 MG tablet Take 1 tablet (40 mg total) by mouth at bedtime. 02/23/15  Yes Tammi Sou, MD  zolpidem (AMBIEN) 5 MG tablet Take 1 tablet (5 mg total) by mouth daily. 03/05/15  Yes Tammi Sou, MD   BP 146/89 mmHg  Pulse 79  Temp(Src) 97.6 F (36.4 C) (Oral)  Resp 17  Ht 5\' 4"  (1.626 m)  Wt 119 lb (53.978 kg)  BMI 20.42 kg/m2  SpO2 98%  LMP 01/08/1972 Physical Exam  Constitutional: She is oriented to person, place, and time.  Chronically ill   HENT:  Head: Normocephalic.  Mouth/Throat: Oropharynx is clear and moist.  No obvious scalp hematoma   Eyes: Conjunctivae are normal. Pupils are equal, round, and reactive to light.  Neck:  Mild paracervical tenderness, nl ROM neck. No obvious deformity   Cardiovascular: Normal rate, regular rhythm and normal heart sounds.   Pulmonary/Chest: Effort  normal and breath sounds normal. No respiratory distress. She has no wheezes. She has no rales.  Abdominal: Soft. Bowel sounds are normal. She exhibits no distension. There is no tenderness. There is no rebound.  Musculoskeletal: Normal range of motion.  Pelvis stable, nl ROM bilateral hips. No obvious spinal tenderness   Neurological: She is alert and oriented to person, place, and time.  Skin: Skin is warm and dry.  Psychiatric: She has a normal mood and affect. Her behavior is normal. Judgment and thought content normal.  Nursing note and vitals reviewed.   ED Course  Procedures (including critical care time) Labs Review Labs Reviewed  CBC WITH DIFFERENTIAL/PLATELET -  Abnormal; Notable for the following:    RBC 3.60 (*)    Hemoglobin 10.4 (*)    HCT 32.8 (*)    RDW 16.7 (*)    All other components within normal limits  BASIC METABOLIC PANEL - Abnormal; Notable for the following:    Glucose, Bld 113 (*)    BUN 29 (*)    Creatinine, Ser 1.37 (*)    Calcium 8.4 (*)    GFR calc non Af Amer 32 (*)    GFR calc Af Amer 38 (*)    All other components within normal limits  TROPONIN I  CK    Imaging Review Dg Chest 2 View  03/15/2015  CLINICAL DATA:  Fall from the commode last evening, no specific chest pain noted. EXAM: CHEST  2 VIEW COMPARISON:  None. FINDINGS: Cardiac shadow is mildly enlarged. The lungs are clear. No pneumothorax is seen. No rib fractures are noted. Degenerative changes of both shoulder joints and thoracic spine are seen. IMPRESSION: No acute abnormality noted. Electronically Signed   By: Inez Catalina M.D.   On: 03/15/2015 16:01   Dg Pelvis 1-2 Views  03/15/2015  CLINICAL DATA:  Fall last night off toilet. EXAM: PELVIS - 1-2 VIEW COMPARISON:  None. FINDINGS: Mild symmetric degenerative changes in the hips. No acute bony abnormality. Specifically, no fracture, subluxation, or dislocation. Soft tissues are intact. IMPRESSION: No acute bony abnormality. Electronically Signed   By: Rolm Baptise M.D.   On: 03/15/2015 16:01   Ct Head Wo Contrast  03/15/2015  CLINICAL DATA:  Status post fall, denies loss consciousness EXAM: CT HEAD WITHOUT CONTRAST CT CERVICAL SPINE WITHOUT CONTRAST TECHNIQUE: Multidetector CT imaging of the head and cervical spine was performed following the standard protocol without intravenous contrast. Multiplanar CT image reconstructions of the cervical spine were also generated. COMPARISON:  None. FINDINGS: CT HEAD FINDINGS There is no evidence of mass effect, midline shift, or extra-axial fluid collections. There is no evidence of a space-occupying lesion or intracranial hemorrhage. There is no evidence of a cortical-based area  of acute infarction. There is an old right parietal lobe infarct with encephalomalacia. There is generalized cerebral atrophy. There is periventricular white matter low attenuation likely secondary to microangiopathy. The ventricles and sulci are appropriate for the patient's age. The basal cisterns are patent. Visualized portions of the orbits are unremarkable. The visualized portions of the paranasal sinuses and mastoid air cells are unremarkable. Cerebrovascular atherosclerotic calcifications are noted. The osseous structures are unremarkable. CT CERVICAL SPINE FINDINGS The alignment is anatomic. There is a compression fracture of the T1 vertebral body with approximately 10% height loss. There is 4 mm of anterolisthesis of C4 on C5 secondary to facet disease. The prevertebral soft tissues are normal. The intraspinal soft tissues are not fully imaged on this examination due to  poor soft tissue contrast, but there is no gross soft tissue abnormality. Degenerative disc disease with disc height loss at C5-6 and C6-7. There is osseous fusion of the posterior elements at C2-3. There is moderate bilateral facet arthropathy at C3-4. There is bilateral osseous fusion of the posterior elements at C4-5. There is bilateral facet arthropathy at C5-6, C6-7, C7-T1 and T1-2. There is a broad-based disc osteophyte complex at C6-7. The visualized portions of the lung apices demonstrate no focal abnormality. Severe enlargement of the right thyroid gland measuring 7 x 4.4 cm and rightward deviation of the trachea. Nodular in enlargement of the left thyroid gland measures 3.6 x 2.4 cm. IMPRESSION: 1. No acute intracranial pathology. 2. Compression fracture of the T1 vertebral body with approximately 10% height loss. 3. Large multinodular thyroid goiter. Electronically Signed   By: Kathreen Devoid   On: 03/15/2015 16:06   Ct Cervical Spine Wo Contrast  03/15/2015  CLINICAL DATA:  Status post fall, denies loss consciousness EXAM: CT  HEAD WITHOUT CONTRAST CT CERVICAL SPINE WITHOUT CONTRAST TECHNIQUE: Multidetector CT imaging of the head and cervical spine was performed following the standard protocol without intravenous contrast. Multiplanar CT image reconstructions of the cervical spine were also generated. COMPARISON:  None. FINDINGS: CT HEAD FINDINGS There is no evidence of mass effect, midline shift, or extra-axial fluid collections. There is no evidence of a space-occupying lesion or intracranial hemorrhage. There is no evidence of a cortical-based area of acute infarction. There is an old right parietal lobe infarct with encephalomalacia. There is generalized cerebral atrophy. There is periventricular white matter low attenuation likely secondary to microangiopathy. The ventricles and sulci are appropriate for the patient's age. The basal cisterns are patent. Visualized portions of the orbits are unremarkable. The visualized portions of the paranasal sinuses and mastoid air cells are unremarkable. Cerebrovascular atherosclerotic calcifications are noted. The osseous structures are unremarkable. CT CERVICAL SPINE FINDINGS The alignment is anatomic. There is a compression fracture of the T1 vertebral body with approximately 10% height loss. There is 4 mm of anterolisthesis of C4 on C5 secondary to facet disease. The prevertebral soft tissues are normal. The intraspinal soft tissues are not fully imaged on this examination due to poor soft tissue contrast, but there is no gross soft tissue abnormality. Degenerative disc disease with disc height loss at C5-6 and C6-7. There is osseous fusion of the posterior elements at C2-3. There is moderate bilateral facet arthropathy at C3-4. There is bilateral osseous fusion of the posterior elements at C4-5. There is bilateral facet arthropathy at C5-6, C6-7, C7-T1 and T1-2. There is a broad-based disc osteophyte complex at C6-7. The visualized portions of the lung apices demonstrate no focal  abnormality. Severe enlargement of the right thyroid gland measuring 7 x 4.4 cm and rightward deviation of the trachea. Nodular in enlargement of the left thyroid gland measures 3.6 x 2.4 cm. IMPRESSION: 1. No acute intracranial pathology. 2. Compression fracture of the T1 vertebral body with approximately 10% height loss. 3. Large multinodular thyroid goiter. Electronically Signed   By: Kathreen Devoid   On: 03/15/2015 16:06   I have personally reviewed and evaluated these images and lab results as part of my medical decision-making.   EKG Interpretation   Date/Time:  Wednesday March 15 2015 14:20:37 EST Ventricular Rate:  76 PR Interval:    QRS Duration: 145 QT Interval:  410 QTC Calculation: 461 R Axis:   97 Text Interpretation:  Atrial fibrillation Right bundle branch block No  previous ECGs available Confirmed by [facility]  MD, Jameon Deller ([id number]) on 03/15/2015  3:23:54 PM      MDM   Final diagnoses:  None   Royale Alfred is a 82 y.o. female here with fall, neck pain. Patient on eliquis. Will check labs, CT head/neck, xrays. Denies syncope.   4:45 PM Labs at baseline, CK nl. Was ambulating at her facility today. CT showed T1 fracture likely old, no acute fracture. Nonfocal neuro exam. Recommend prn tylenol. Will dc home with son.     Wandra Arthurs, MD 03/15/15 [phone]

## 2015-03-17 DIAGNOSIS — M6281 Muscle weakness (generalized): Secondary | ICD-10-CM | POA: Diagnosis not present

## 2015-03-17 DIAGNOSIS — N3946 Mixed incontinence: Secondary | ICD-10-CM | POA: Diagnosis not present

## 2015-03-17 DIAGNOSIS — Z9181 History of falling: Secondary | ICD-10-CM | POA: Diagnosis not present

## 2015-03-20 ENCOUNTER — Emergency Department (HOSPITAL_BASED_OUTPATIENT_CLINIC_OR_DEPARTMENT_OTHER): Payer: Medicare Other

## 2015-03-20 ENCOUNTER — Inpatient Hospital Stay (HOSPITAL_BASED_OUTPATIENT_CLINIC_OR_DEPARTMENT_OTHER)
Admission: EM | Admit: 2015-03-20 | Discharge: 2015-03-24 | DRG: 481 | Disposition: A | Discharge status: Skilled Nursing Facility | Payer: Medicare Other | Attending: Internal Medicine | Admitting: Internal Medicine

## 2015-03-20 ENCOUNTER — Encounter (HOSPITAL_BASED_OUTPATIENT_CLINIC_OR_DEPARTMENT_OTHER): Payer: Self-pay | Admitting: *Deleted

## 2015-03-20 DIAGNOSIS — Z79899 Other long term (current) drug therapy: Secondary | ICD-10-CM | POA: Diagnosis not present

## 2015-03-20 DIAGNOSIS — N184 Chronic kidney disease, stage 4 (severe): Secondary | ICD-10-CM | POA: Diagnosis present

## 2015-03-20 DIAGNOSIS — Z8673 Personal history of transient ischemic attack (TIA), and cerebral infarction without residual deficits: Secondary | ICD-10-CM

## 2015-03-20 DIAGNOSIS — I10 Essential (primary) hypertension: Secondary | ICD-10-CM | POA: Diagnosis not present

## 2015-03-20 DIAGNOSIS — M810 Age-related osteoporosis without current pathological fracture: Secondary | ICD-10-CM | POA: Diagnosis present

## 2015-03-20 DIAGNOSIS — S299XXA Unspecified injury of thorax, initial encounter: Secondary | ICD-10-CM | POA: Diagnosis not present

## 2015-03-20 DIAGNOSIS — N3946 Mixed incontinence: Secondary | ICD-10-CM | POA: Diagnosis not present

## 2015-03-20 DIAGNOSIS — I482 Chronic atrial fibrillation, unspecified: Secondary | ICD-10-CM | POA: Diagnosis present

## 2015-03-20 DIAGNOSIS — D649 Anemia, unspecified: Secondary | ICD-10-CM | POA: Diagnosis present

## 2015-03-20 DIAGNOSIS — Y9289 Other specified places as the place of occurrence of the external cause: Secondary | ICD-10-CM | POA: Diagnosis not present

## 2015-03-20 DIAGNOSIS — S72009A Fracture of unspecified part of neck of unspecified femur, initial encounter for closed fracture: Secondary | ICD-10-CM | POA: Diagnosis not present

## 2015-03-20 DIAGNOSIS — I129 Hypertensive chronic kidney disease with stage 1 through stage 4 chronic kidney disease, or unspecified chronic kidney disease: Secondary | ICD-10-CM | POA: Diagnosis present

## 2015-03-20 DIAGNOSIS — R55 Syncope and collapse: Secondary | ICD-10-CM | POA: Diagnosis not present

## 2015-03-20 DIAGNOSIS — E039 Hypothyroidism, unspecified: Secondary | ICD-10-CM | POA: Diagnosis present

## 2015-03-20 DIAGNOSIS — M6281 Muscle weakness (generalized): Secondary | ICD-10-CM | POA: Diagnosis not present

## 2015-03-20 DIAGNOSIS — R488 Other symbolic dysfunctions: Secondary | ICD-10-CM | POA: Diagnosis not present

## 2015-03-20 DIAGNOSIS — Z833 Family history of diabetes mellitus: Secondary | ICD-10-CM | POA: Diagnosis not present

## 2015-03-20 DIAGNOSIS — W19XXXA Unspecified fall, initial encounter: Secondary | ICD-10-CM

## 2015-03-20 DIAGNOSIS — Z9181 History of falling: Secondary | ICD-10-CM | POA: Diagnosis not present

## 2015-03-20 DIAGNOSIS — Z66 Do not resuscitate: Secondary | ICD-10-CM | POA: Diagnosis present

## 2015-03-20 DIAGNOSIS — S199XXA Unspecified injury of neck, initial encounter: Secondary | ICD-10-CM | POA: Diagnosis not present

## 2015-03-20 DIAGNOSIS — N390 Urinary tract infection, site not specified: Secondary | ICD-10-CM

## 2015-03-20 DIAGNOSIS — S72141D Displaced intertrochanteric fracture of right femur, subsequent encounter for closed fracture with routine healing: Secondary | ICD-10-CM | POA: Diagnosis not present

## 2015-03-20 DIAGNOSIS — B964 Proteus (mirabilis) (morganii) as the cause of diseases classified elsewhere: Secondary | ICD-10-CM | POA: Diagnosis present

## 2015-03-20 DIAGNOSIS — S72141A Displaced intertrochanteric fracture of right femur, initial encounter for closed fracture: Principal | ICD-10-CM | POA: Diagnosis present

## 2015-03-20 DIAGNOSIS — S72001A Fracture of unspecified part of neck of right femur, initial encounter for closed fracture: Secondary | ICD-10-CM

## 2015-03-20 DIAGNOSIS — E785 Hyperlipidemia, unspecified: Secondary | ICD-10-CM | POA: Diagnosis present

## 2015-03-20 DIAGNOSIS — T148 Other injury of unspecified body region: Secondary | ICD-10-CM | POA: Diagnosis not present

## 2015-03-20 DIAGNOSIS — W1812XA Fall from or off toilet with subsequent striking against object, initial encounter: Secondary | ICD-10-CM | POA: Diagnosis present

## 2015-03-20 DIAGNOSIS — M25551 Pain in right hip: Secondary | ICD-10-CM

## 2015-03-20 DIAGNOSIS — W010XXA Fall on same level from slipping, tripping and stumbling without subsequent striking against object, initial encounter: Secondary | ICD-10-CM | POA: Diagnosis not present

## 2015-03-20 DIAGNOSIS — S0990XA Unspecified injury of head, initial encounter: Secondary | ICD-10-CM | POA: Diagnosis not present

## 2015-03-20 DIAGNOSIS — R2689 Other abnormalities of gait and mobility: Secondary | ICD-10-CM | POA: Diagnosis not present

## 2015-03-20 DIAGNOSIS — F0391 Unspecified dementia with behavioral disturbance: Secondary | ICD-10-CM | POA: Diagnosis present

## 2015-03-20 HISTORY — DX: Fracture of unspecified part of neck of unspecified femur, initial encounter for closed fracture: S72.009A

## 2015-03-20 HISTORY — DX: Personal history of (healed) traumatic fracture: Z87.81

## 2015-03-20 LAB — URINALYSIS, ROUTINE W REFLEX MICROSCOPIC
BILIRUBIN URINE: NEGATIVE
Glucose, UA: NEGATIVE mg/dL
HGB URINE DIPSTICK: NEGATIVE
Ketones, ur: NEGATIVE mg/dL
Nitrite: POSITIVE — AB
PROTEIN: 30 mg/dL — AB
Specific Gravity, Urine: 1.017 (ref 1.005–1.030)
pH: 7.5 (ref 5.0–8.0)

## 2015-03-20 LAB — COMPREHENSIVE METABOLIC PANEL
ALBUMIN: 3.5 g/dL (ref 3.5–5.0)
ALT: 15 U/L (ref 14–54)
AST: 27 U/L (ref 15–41)
Alkaline Phosphatase: 51 U/L (ref 38–126)
Anion gap: 12 (ref 5–15)
BILIRUBIN TOTAL: 0.8 mg/dL (ref 0.3–1.2)
BUN: 37 mg/dL — AB (ref 6–20)
CALCIUM: 8.3 mg/dL — AB (ref 8.9–10.3)
CO2: 22 mmol/L (ref 22–32)
CREATININE: 1.37 mg/dL — AB (ref 0.44–1.00)
Chloride: 103 mmol/L (ref 101–111)
GFR calc Af Amer: 38 mL/min — ABNORMAL LOW (ref >60)
GFR, EST NON AFRICAN AMERICAN: 32 mL/min — AB (ref >60)
Glucose, Bld: 153 mg/dL — ABNORMAL HIGH (ref 65–99)
POTASSIUM: 4.1 mmol/L (ref 3.5–5.1)
Sodium: 137 mmol/L (ref 135–145)
TOTAL PROTEIN: 6.4 g/dL — AB (ref 6.5–8.1)

## 2015-03-20 LAB — CBC WITH DIFFERENTIAL/PLATELET
BASOS ABS: 0 10*3/uL (ref 0.0–0.1)
BASOS PCT: 0 %
Eosinophils Absolute: 0 10*3/uL (ref 0.0–0.7)
Eosinophils Relative: 0 %
HEMATOCRIT: 26.6 % — AB (ref 36.0–46.0)
HEMOGLOBIN: 8.6 g/dL — AB (ref 12.0–15.0)
LYMPHS PCT: 5 %
Lymphs Abs: 0.6 10*3/uL — ABNORMAL LOW (ref 0.7–4.0)
MCH: 28.6 pg (ref 26.0–34.0)
MCHC: 32.3 g/dL (ref 30.0–36.0)
MCV: 88.4 fL (ref 78.0–100.0)
MONO ABS: 0.9 10*3/uL (ref 0.1–1.0)
Monocytes Relative: 7 %
NEUTROS ABS: 11.7 10*3/uL — AB (ref 1.7–7.7)
NEUTROS PCT: 88 %
Platelets: 243 10*3/uL (ref 150–400)
RBC: 3.01 MIL/uL — AB (ref 3.87–5.11)
RDW: 15.7 % — AB (ref 11.5–15.5)
WBC: 13.3 10*3/uL — AB (ref 4.0–10.5)

## 2015-03-20 LAB — CK: Total CK: 151 U/L (ref 38–234)

## 2015-03-20 LAB — TROPONIN I

## 2015-03-20 LAB — OCCULT BLOOD X 1 CARD TO LAB, STOOL: Fecal Occult Bld: NEGATIVE

## 2015-03-20 LAB — URINE MICROSCOPIC-ADD ON: RBC / HPF: NONE SEEN RBC/hpf (ref 0–5)

## 2015-03-20 MED ORDER — CEPHALEXIN 500 MG PO CAPS
500.0000 mg | ORAL_CAPSULE | Freq: Three times a day (TID) | ORAL | Status: DC
  Administered 2015-03-21 – 2015-03-22 (×2): 500 mg via ORAL
  Filled 2015-03-20 (×2): qty 1

## 2015-03-20 MED ORDER — SODIUM CHLORIDE 0.9 % IV SOLN
INTRAVENOUS | Status: DC
  Administered 2015-03-20 – 2015-03-24 (×8): via INTRAVENOUS

## 2015-03-20 MED ORDER — LEVOTHYROXINE SODIUM 25 MCG PO TABS
75.0000 ug | ORAL_TABLET | Freq: Every day | ORAL | Status: DC
  Administered 2015-03-21 – 2015-03-24 (×4): 75 ug via ORAL
  Filled 2015-03-20 (×5): qty 1

## 2015-03-20 MED ORDER — ONDANSETRON HCL 4 MG/2ML IJ SOLN
4.0000 mg | Freq: Once | INTRAMUSCULAR | Status: AC
  Administered 2015-03-20: 4 mg via INTRAVENOUS
  Filled 2015-03-20: qty 2

## 2015-03-20 MED ORDER — SENNOSIDES-DOCUSATE SODIUM 8.6-50 MG PO TABS: 1.0000 | ORAL_TABLET | Freq: Every evening | ORAL | Status: DC | PRN

## 2015-03-20 MED ORDER — MORPHINE SULFATE (PF) 2 MG/ML IV SOLN
0.5000 mg | INTRAVENOUS | Status: DC | PRN
  Administered 2015-03-21 – 2015-03-23 (×4): 0.5 mg via INTRAVENOUS
  Filled 2015-03-20 (×4): qty 1

## 2015-03-20 MED ORDER — MORPHINE SULFATE (PF) 4 MG/ML IV SOLN
4.0000 mg | Freq: Once | INTRAVENOUS | Status: AC
  Administered 2015-03-20: 4 mg via INTRAVENOUS
  Filled 2015-03-20: qty 1

## 2015-03-20 MED ORDER — ATENOLOL 50 MG PO TABS
50.0000 mg | ORAL_TABLET | Freq: Every day | ORAL | Status: DC
Start: 2015-03-21 — End: 2015-03-24
  Administered 2015-03-21 – 2015-03-24 (×4): 50 mg via ORAL
  Filled 2015-03-20 (×4): qty 1

## 2015-03-20 MED ORDER — SODIUM CHLORIDE 0.9 % IV BOLUS (SEPSIS)
500.0000 mL | Freq: Once | INTRAVENOUS | Status: AC
  Administered 2015-03-20 (×2): 500 mL via INTRAVENOUS

## 2015-03-20 MED ORDER — DEXTROSE 5 % IV SOLN
1.0000 g | Freq: Once | INTRAVENOUS | Status: AC
  Administered 2015-03-20: 1 g via INTRAVENOUS
  Filled 2015-03-20: qty 10

## 2015-03-20 MED ORDER — LORAZEPAM 2 MG/ML IJ SOLN: 0.5000 mg | Freq: Once | INTRAMUSCULAR | Status: DC | PRN

## 2015-03-20 MED ORDER — PRAVASTATIN SODIUM 40 MG PO TABS
40.0000 mg | ORAL_TABLET | Freq: Every day | ORAL | Status: DC
  Administered 2015-03-21 – 2015-03-23 (×3): 40 mg via ORAL
  Filled 2015-03-20 (×3): qty 1

## 2015-03-20 MED ORDER — FENTANYL CITRATE (PF) 100 MCG/2ML IJ SOLN
50.0000 ug | Freq: Once | INTRAMUSCULAR | Status: AC
  Administered 2015-03-20: 50 ug via INTRAVENOUS
  Filled 2015-03-20: qty 2

## 2015-03-20 MED ORDER — SODIUM CHLORIDE 0.9 % IV BOLUS (SEPSIS): 1000.0000 mL | Freq: Once | INTRAVENOUS | Status: DC

## 2015-03-20 MED ORDER — HYDROCODONE-ACETAMINOPHEN 5-325 MG PO TABS
1.0000 | ORAL_TABLET | Freq: Four times a day (QID) | ORAL | Status: DC | PRN
  Administered 2015-03-23 – 2015-03-24 (×4): 1 via ORAL
  Filled 2015-03-20 (×5): qty 1

## 2015-03-20 NOTE — H&P (Signed)
History and Physical  Patient Name: Marie Robertson     [id number]    DOB: 18-Oct-1920    DOA: 03/20/2015 Referring physician: Malvin Johns, MD PCP: Tammi Sou, MD      Chief Complaint: Hip pain and fall  HPI: Marie Robertson is a 82 y.o. female with a past medical history significant for Afib on apixaban, hypothyroidism, dementia, and HTN who presents with hip pain after a fall.  All history is collected from EDP and the patient's sons, as the patient is delirious on arrival to [facility].  They report that the patient was in her normal state of health until today was found by staff at her facility on the floor. Staff reported that they had seen her early in the morning her normal self, but that around noon she was found on the floor. The patient maintained in the ER that she had fallen overnight, however the patient was beginning to be more delirious while in the ER. The sons were aware of no other complaints of chest pain, dizziness, pre-syncope, or micturation preceding the fall, but note that this is her second fall requiring ER visit in the last week.  In the ED, the patient was hemodynamically stable and a plain radiograph of the right hip showed a displaced intertrochanteric fracture.  The case was discussed with Dr. Ronnie Derby who agreed to see the patient, and [facility] were asked to admit for medical management.      Anesthesia Specific concerns: Presence of loose teeth: None Anesthesia problems in past: None History of bleeding disorder: None  Review of Systems:  Unable to obtain due to patient mentation.         No Known Allergies  Prior to Admission medications   Medication Sig Start Date End Date Taking? Authorizing Provider  atenolol (TENORMIN) 50 MG tablet Take 50 mg by mouth daily.   Yes Historical Provider, MD  Cholecalciferol (VITAMIN D-3) 1000 units CAPS Take 3 capsules by mouth daily.   Yes Historical Provider, MD  ELIQUIS 2.5 MG TABS tablet Take 2.5 mg by mouth  2 (two) times daily. 01/24/15  Yes Historical Provider, MD  levothyroxine (SYNTHROID, LEVOTHROID) 75 MCG tablet Take 1 tablet (75 mcg total) by mouth daily. 02/22/15  Yes Tammi Sou, MD  pravastatin (PRAVACHOL) 40 MG tablet Take 1 tablet (40 mg total) by mouth at bedtime. 02/23/15  Yes Tammi Sou, MD  zolpidem (AMBIEN) 5 MG tablet Take 1 tablet (5 mg total) by mouth daily. 03/05/15  Yes Tammi Sou, MD    Past Medical History  Diagnosis Date  . Osteoarthritis   . Depression   . Hypertension   . Chronic atrial fibrillation ([place])   . Hypothyroidism   . Constipation   . Insomnia   . Hyperlipidemia   . Osteoporosis   . CVA (cerebral infarction) approx 2012    residual defect: poor R sided peripheral vision per son    Past Surgical History  Procedure Laterality Date  . Appendectomy    . Knee arthroscopy Right   . Thyroid surgery    . Colonoscopy  08/22/08    Family history: family history includes Alcohol abuse in her father; Diabetes in her mother; Stroke in her mother.  Social History: Patient lives in the [place] living section of her facility.  She ambulates without a cane or walker at baseline.  She has early dementia, per son, Marie Robertson.  She is a non-smoker.         Physical Exam:  BP 144/85 mmHg  Pulse 93  Temp(Src) 97.6 F (36.4 C) (Oral)  Resp 15  SpO2 96%  LMP 01/08/1972 General appearance: Thin elderly female, awake, praying, agitated.  Not oriented. Eyes: Anicteric, conjunctiva pink, lids and lashes normal.     ENT: No nasal deformity, discharge, or epistaxis.  OP moist without lesions.   Skin: Warm and dry.  No suspicious rashes or lesions or skin breakdown. Cardiac: RRR, nl S1-S2, no murmurs appreciated.  Respiratory: Normal respiratory rate and rhythm.  CTAB without rales or wheezes. Abdomen: Abdomen soft without rigidity.  No TTP. No ascites, distension.   MSK: Unable to examine R hip given patient declines. Neuro: Not oriented to place or  situation or time.  Oriented to "night".  Responds to questions.  Speech is fluent.  Moves all extremities equally and with normal coordination and qith vigorous strength with exception of R leg.  Otherwise does not cooperate with exam and asks me to leave.    Psych: Behavior agitated, paranoid of nurses.       Labs on Admission:  The metabolic panel shows normal electrolytes and stable renal function. Transaminases and bilirubin are normal. Urinalysis is positive for pyuria and bacteria. CK is normal. Troponin is normal. The complete blood count shows leukocytosis, anemia.   Radiological Exams on Admission: Personally reviewed: Dg Chest 1 View  03/20/2015  CLINICAL DATA:  Found down today at nursing home. EXAM: CHEST  1 VIEW; DG HIP (WITH OR WITHOUT PELVIS) 2-3V RIGHT COMPARISON:  Chest x-ray 03/15/2015 FINDINGS: One view chest: The cardiac silhouette, mediastinal and hilar contours are within normal limits and stable. There is tortuosity of the thoracic aorta. Stable moderate-sized hiatal hernia. No acute pulmonary findings. The bony thorax is intact. Stable advanced degenerative changes involving both shoulders. Pelvis/right hip: There is a displaced intertrochanteric fracture of the right hip. Moderate varus deformity. The left hip is intact. The pubic symphysis and SI joints are intact. No definite pelvic fractures. IMPRESSION: 1. Displaced intertrochanteric fracture of the right hip. 2. No acute cardiopulmonary findings. Electronically Signed   By: Marijo Sanes M.D.   On: 03/20/2015 17:57   Ct Head Wo Contrast  03/20/2015  CLINICAL DATA:  [place] today, found lying on floor, denies loss of consciousness, mild confusion EXAM: CT HEAD WITHOUT CONTRAST CT CERVICAL SPINE WITHOUT CONTRAST TECHNIQUE: Multidetector CT imaging of the head and cervical spine was performed following the standard protocol without intravenous contrast. Multiplanar CT image reconstructions of the cervical spine were also  generated. COMPARISON:  03/15/2015 FINDINGS: CT HEAD FINDINGS Severe diffuse atrophy. Encephalomalacia left occipital lobe. No evidence of acute infarct or mass. No hemorrhage or extra-axial fluid. No skull fracture. Visualized portions of the paranasal sinuses are clear. CT CERVICAL SPINE FINDINGS Stable grade 1 anterior listhesis of C4 on C5 stable degenerative disc disease throughout the cervical spine most severe at C5-6 and C6-7. Stable mild compression deformity involving the superior endplate of T1. Stable large bilateral multinodular thyroid goiter. Unchanged measurements from 03/15/2015. IMPRESSION: No change when compared to recent prior study. No acute intracranial abnormalities. There is stable chronic involutional change and left occipital lobe encephalomalacia. Stable mild T1 compression deformity. Electronically Signed   By: Skipper Cliche M.D.   On: 03/20/2015 17:40   Ct Cervical Spine Wo Contrast  03/20/2015  CLINICAL DATA:  [place] today, found lying on floor, denies loss of consciousness, mild confusion EXAM: CT HEAD WITHOUT CONTRAST CT CERVICAL SPINE WITHOUT CONTRAST TECHNIQUE: Multidetector CT imaging of the head  and cervical spine was performed following the standard protocol without intravenous contrast. Multiplanar CT image reconstructions of the cervical spine were also generated. COMPARISON:  03/15/2015 FINDINGS: CT HEAD FINDINGS Severe diffuse atrophy. Encephalomalacia left occipital lobe. No evidence of acute infarct or mass. No hemorrhage or extra-axial fluid. No skull fracture. Visualized portions of the paranasal sinuses are clear. CT CERVICAL SPINE FINDINGS Stable grade 1 anterior listhesis of C4 on C5 stable degenerative disc disease throughout the cervical spine most severe at C5-6 and C6-7. Stable mild compression deformity involving the superior endplate of T1. Stable large bilateral multinodular thyroid goiter. Unchanged measurements from 03/15/2015. IMPRESSION: No change when  compared to recent prior study. No acute intracranial abnormalities. There is stable chronic involutional change and left occipital lobe encephalomalacia. Stable mild T1 compression deformity. Electronically Signed   By: Skipper Cliche M.D.   On: 03/20/2015 17:40   Dg Hip Unilat With Pelvis 2-3 Views Right  03/20/2015  CLINICAL DATA:  Found down today at nursing home. EXAM: CHEST  1 VIEW; DG HIP (WITH OR WITHOUT PELVIS) 2-3V RIGHT COMPARISON:  Chest x-ray 03/15/2015 FINDINGS: One view chest: The cardiac silhouette, mediastinal and hilar contours are within normal limits and stable. There is tortuosity of the thoracic aorta. Stable moderate-sized hiatal hernia. No acute pulmonary findings. The bony thorax is intact. Stable advanced degenerative changes involving both shoulders. Pelvis/right hip: There is a displaced intertrochanteric fracture of the right hip. Moderate varus deformity. The left hip is intact. The pubic symphysis and SI joints are intact. No definite pelvic fractures. IMPRESSION: 1. Displaced intertrochanteric fracture of the right hip. 2. No acute cardiopulmonary findings. Electronically Signed   By: Marijo Sanes M.D.   On: 03/20/2015 17:57    EKG: Independently reviewed. Afib rate, 85.  RBBB, no ST abnormalities.    Assessment and Plan: 1. Hip fracture: The patient will be seen by Dr. Ronnie Derby in the morning at [facility], to evaluate for operative fixation of the R hip.   -Admit to tele bed -Hydrocodone-acetaminophen or morphine as tolerated for pain -Lorazepam once PRN for agitation -Bed rest, apply ice, document sedation and vitals per Hip fracture protocol -NPO at midnight -MIVF  -Treat delirium with quiet environment, adequate pain control, primarily    Overall, the patient is at moderate risk for the planned surgery given age and sedentary lifestyle.   Cardiac: Patient has a RCRI score of 1 for Afib (no other CHF, CAD, DM treated with insulin, TIA/CVA, Cr > 2.0). The patient  has no active cardiac symptoms.  She however has a functional capacity <4 METs, and would be expected to be at above average risk for cardiac complications from this intermediate risk procedure solely given age.  She has average risk for cardiac complications according to NSQIP surgical risk calculator. -No further testing needed  Pulmonary: Patient does not have diagnosed COPD or OSA. Patient is at average risk for pulmonary complications.   Endocrine: Patient has no history of steroid use or DM.  DVT ppx: Patient is at average risk for DVTs. -Defer to surgeon's usual protocol.  Meds: Hold apixaban.  2. Anemia: Unclear chronicity.  FOBT negative x1 in ER. -Trend CBC -Iron studies ordered -Reticulocytes and B12/folate ordered  3. UTI: -Urine culture added on -Cephalexin starting tomorrow post-operatively  4. Delirium: Likely from acute hip fracture, and UTI in patient with early dementia. -Conservative measures and adequate pain control -Lorazepam once if agitated  5. Atrial fibrillation: CHADS2Vasc 4. -Hold eliquis -Continue atenolol and statin  DVT PPx: SCDs Diet: NPO at midnight  Consultants: Orthopedics Code Status: DO NOT RESUSCITATE, confirmed with POA, Marie Robertson Family Communication: Marie Robertson and Marie Robertson, by phone.  Medical decision making: What exists of the patient's previous chart was reviewed in depth and the case was discussed with Dr. Tamera Punt. Patient seen 10:07 PM on 03/20/2015.  Disposition Plan:  Admit to [facility] telemetry for hip fracture.  Consult with Ortho tomorrow.    Edwin Dada [facility] Pager [phone]

## 2015-03-20 NOTE — ED Notes (Signed)
Pt brought to ED via EMS from [place]. Staff reports pt missed PT appt and they went to check on her and found her in the floor. Pt states she did not fall, but was lying on the floor by choice, d/t being unable to sleep in the bed and was afraid she would fall.

## 2015-03-20 NOTE — ED Notes (Signed)
On heart monitor 

## 2015-03-20 NOTE — Progress Notes (Signed)
82 yo F with hypothyroidism and chronic afib on Eliquis, with broken hip.  Lives in indep living facility.  Fell last night, not found til today, ER notes inconsistent history.  In ER, c/o hip pain, neck pain.  R hip fracture by radiograph.  Head scan, c-spine normal. Of note, Hgb 8 from baseline 10.  Guiac low.  Has UTI, treated in ED.  In Afib, rate 80s.  To tele bed, Dr. Ronnie Derby to see.

## 2015-03-20 NOTE — ED Provider Notes (Signed)
CSN: [id number]     Arrival date & time 03/20/15  1554 History  By signing my name below, I, Marie Robertson, attest that this documentation has been prepared under the direction and in the presence of Malvin Johns, MD. Electronically Signed: Eustaquio Robertson, ED Scribe. 03/20/2015. 4:58 PM.    Chief Complaint  Patient presents with  . Leg Pain   The history is provided by the patient. No language interpreter was used.     HPI Comments: Marie Robertson is a 82 y.o. female who presents to the Emergency Department complaining of gradual onset, constant, cramping, right hip pain s/p possible ground level fall that occurred at 3 AM this morning (approximately 14 hours ago). Pt does not recall falling but remembers laying on the floor all day attempting to get someone's attention. She then reports that she decided to lay on the floor by choice last night because she wanted something hard to sleep on but states her head is "cloudy" and she does not remember what happened. She lives at an independent living facility. Staff told family members that they checked on her this morning and she was fine. When they checked on her later on in the day, they found her on the floor complaining of leg pain. Pt is alert and oriented to person, place, and time. Family mentions that pt has had multiple falls recently. She was just taken off of Warfarin and placed on Eliquis. She also complains of posterior neck pain. Family states that pt has been complaining about neck pain for the past couple of months. Pt has also had cold like symptoms since January (approximately 2 months). Denies any other associated symptoms. Pt is UTD on tetanus.   Past Medical History  Diagnosis Date  . Osteoarthritis   . Depression   . Hypertension   . Chronic atrial fibrillation ([place])   . Hypothyroidism   . Constipation   . Insomnia   . Hyperlipidemia   . Osteoporosis   . CVA (cerebral infarction) approx 2012    residual defect: poor R  sided peripheral vision per son   Past Surgical History  Procedure Laterality Date  . Appendectomy    . Knee arthroscopy Right   . Thyroid surgery    . Colonoscopy  08/22/08   Family History  Problem Relation Age of Onset  . Stroke Mother   . Diabetes Mother   . Alcohol abuse Father    Social History  Substance Use Topics  . Smoking status: Never Smoker   . Smokeless tobacco: Never Used  . Alcohol Use: No   OB History    No data available     Review of Systems  Constitutional: Negative for fever, chills, diaphoresis and fatigue.  HENT: Negative for congestion, rhinorrhea and sneezing.   Eyes: Negative.   Respiratory: Negative for cough, chest tightness and shortness of breath.   Cardiovascular: Negative for chest pain and leg swelling.  Gastrointestinal: Negative for nausea, vomiting, abdominal pain, diarrhea and blood in stool.  Genitourinary: Negative for frequency, hematuria, flank pain and difficulty urinating.  Musculoskeletal: Positive for arthralgias (right hip) and neck pain (chronic). Negative for back pain.  Skin: Negative for rash.  Neurological: Negative for dizziness, speech difficulty, weakness, numbness and headaches.   Allergies  Review of patient's allergies indicates no known allergies.  Home Medications   Prior to Admission medications   Medication Sig Start Date End Date Taking? Authorizing Provider  atenolol (TENORMIN) 50 MG tablet Take 50 mg by mouth  daily.   Yes Historical Provider, MD  Cholecalciferol (VITAMIN D-3) 1000 units CAPS Take 3 capsules by mouth daily.   Yes Historical Provider, MD  ELIQUIS 2.5 MG TABS tablet Take 2.5 mg by mouth 2 (two) times daily. 01/24/15  Yes Historical Provider, MD  levothyroxine (SYNTHROID, LEVOTHROID) 75 MCG tablet Take 1 tablet (75 mcg total) by mouth daily. 02/22/15  Yes Tammi Sou, MD  pravastatin (PRAVACHOL) 40 MG tablet Take 1 tablet (40 mg total) by mouth at bedtime. 02/23/15  Yes Tammi Sou, MD   zolpidem (AMBIEN) 5 MG tablet Take 1 tablet (5 mg total) by mouth daily. 03/05/15  Yes Tammi Sou, MD   BP 130/81 mmHg  Pulse 92  Resp 18  SpO2 98%  LMP 01/08/1972   Physical Exam  Constitutional: She is oriented to person, place, and time. She appears well-developed and well-nourished.  HENT:  Head: Normocephalic.  Abrasion to right forehead  Eyes: Pupils are equal, round, and reactive to light.  Neck: Normal range of motion. Neck supple.  Tenderness throughout C spine No pain to thoracic or lumbosacral spine No step offs or deformities  Cardiovascular: Normal rate, regular rhythm and normal heart sounds.   Pulmonary/Chest: Effort normal and breath sounds normal. No respiratory distress. She has no wheezes. She has no rales. She exhibits no tenderness.  Abdominal: Soft. Bowel sounds are normal. There is no tenderness. There is no rebound and no guarding.  Musculoskeletal: She exhibits tenderness. She exhibits no edema.  Tenderness on ROM of right hip Pedal pulse intact No pain on palpation or ROM of other extremities  Lymphadenopathy:    She has no cervical adenopathy.  Neurological: She is alert and oriented to person, place, and time.  Skin: Skin is warm and dry. No rash noted.  Psychiatric: She has a normal mood and affect.    ED Course  Procedures (including critical care time)  DIAGNOSTIC STUDIES: Oxygen Saturation is 98% on RA, normal by my interpretation.    COORDINATION OF CARE: 4:52 PM-Discussed treatment plan which includes CT Head and DG R Hip with pt at bedside and pt agreed to plan.   Labs Review Results for orders placed or performed during the hospital encounter of 03/20/15  CBC with Differential  Result Value Ref Range   WBC 13.3 (H) 4.0 - 10.5 K/uL   RBC 3.01 (L) 3.87 - 5.11 MIL/uL   Hemoglobin 8.6 (L) 12.0 - 15.0 g/dL   HCT 26.6 (L) 36.0 - 46.0 %   MCV 88.4 78.0 - 100.0 fL   MCH 28.6 26.0 - 34.0 pg   MCHC 32.3 30.0 - 36.0 g/dL   RDW 15.7  (H) 11.5 - 15.5 %   Platelets 243 150 - 400 K/uL   Neutrophils Relative % 88 %   Neutro Abs 11.7 (H) 1.7 - 7.7 K/uL   Lymphocytes Relative 5 %   Lymphs Abs 0.6 (L) 0.7 - 4.0 K/uL   Monocytes Relative 7 %   Monocytes Absolute 0.9 0.1 - 1.0 K/uL   Eosinophils Relative 0 %   Eosinophils Absolute 0.0 0.0 - 0.7 K/uL   Basophils Relative 0 %   Basophils Absolute 0.0 0.0 - 0.1 K/uL  Comprehensive metabolic panel  Result Value Ref Range   Sodium 137 135 - 145 mmol/L   Potassium 4.1 3.5 - 5.1 mmol/L   Chloride 103 101 - 111 mmol/L   CO2 22 22 - 32 mmol/L   Glucose, Bld 153 (H) 65 - 99  mg/dL   BUN 37 (H) 6 - 20 mg/dL   Creatinine, Ser 1.37 (H) 0.44 - 1.00 mg/dL   Calcium 8.3 (L) 8.9 - 10.3 mg/dL   Total Protein 6.4 (L) 6.5 - 8.1 g/dL   Albumin 3.5 3.5 - 5.0 g/dL   AST 27 15 - 41 U/L   ALT 15 14 - 54 U/L   Alkaline Phosphatase 51 38 - 126 U/L   Total Bilirubin 0.8 0.3 - 1.2 mg/dL   GFR calc non Af Amer 32 (L) >60 mL/min   GFR calc Af Amer 38 (L) >60 mL/min   Anion gap 12 5 - 15  Urinalysis, Routine w reflex microscopic  Result Value Ref Range   Color, Urine YELLOW YELLOW   APPearance CLOUDY (A) CLEAR   Specific Gravity, Urine 1.017 1.005 - 1.030   pH 7.5 5.0 - 8.0   Glucose, UA NEGATIVE NEGATIVE mg/dL   Hgb urine dipstick NEGATIVE NEGATIVE   Bilirubin Urine NEGATIVE NEGATIVE   Ketones, ur NEGATIVE NEGATIVE mg/dL   Protein, ur 30 (A) NEGATIVE mg/dL   Nitrite POSITIVE (A) NEGATIVE   Leukocytes, UA LARGE (A) NEGATIVE  Troponin I  Result Value Ref Range   Troponin I <0.03 <0.031 ng/mL  CK  Result Value Ref Range   Total CK 151 38 - 234 U/L  Occult blood card to lab, stool RN will collect  Result Value Ref Range   Fecal Occult Bld NEGATIVE NEGATIVE  Urine microscopic-add on  Result Value Ref Range   Squamous Epithelial / LPF 0-5 (A) NONE SEEN   WBC, UA 6-30 0 - 5 WBC/hpf   RBC / HPF NONE SEEN 0 - 5 RBC/hpf   Bacteria, UA MANY (A) NONE SEEN   Dg Chest 1 View  03/20/2015   CLINICAL DATA:  Found down today at nursing home. EXAM: CHEST  1 VIEW; DG HIP (WITH OR WITHOUT PELVIS) 2-3V RIGHT COMPARISON:  Chest x-ray 03/15/2015 FINDINGS: One view chest: The cardiac silhouette, mediastinal and hilar contours are within normal limits and stable. There is tortuosity of the thoracic aorta. Stable moderate-sized hiatal hernia. No acute pulmonary findings. The bony thorax is intact. Stable advanced degenerative changes involving both shoulders. Pelvis/right hip: There is a displaced intertrochanteric fracture of the right hip. Moderate varus deformity. The left hip is intact. The pubic symphysis and SI joints are intact. No definite pelvic fractures. IMPRESSION: 1. Displaced intertrochanteric fracture of the right hip. 2. No acute cardiopulmonary findings. Electronically Signed   By: Marijo Sanes M.D.   On: 03/20/2015 17:57   Dg Chest 2 View  03/15/2015  CLINICAL DATA:  Fall from the commode last evening, no specific chest pain noted. EXAM: CHEST  2 VIEW COMPARISON:  None. FINDINGS: Cardiac shadow is mildly enlarged. The lungs are clear. No pneumothorax is seen. No rib fractures are noted. Degenerative changes of both shoulder joints and thoracic spine are seen. IMPRESSION: No acute abnormality noted. Electronically Signed   By: Inez Catalina M.D.   On: 03/15/2015 16:01   Dg Pelvis 1-2 Views  03/15/2015  CLINICAL DATA:  Fall last night off toilet. EXAM: PELVIS - 1-2 VIEW COMPARISON:  None. FINDINGS: Mild symmetric degenerative changes in the hips. No acute bony abnormality. Specifically, no fracture, subluxation, or dislocation. Soft tissues are intact. IMPRESSION: No acute bony abnormality. Electronically Signed   By: Rolm Baptise M.D.   On: 03/15/2015 16:01   Ct Head Wo Contrast  03/20/2015  CLINICAL DATA:  [place] today, found lying  on floor, denies loss of consciousness, mild confusion EXAM: CT HEAD WITHOUT CONTRAST CT CERVICAL SPINE WITHOUT CONTRAST TECHNIQUE: Multidetector CT imaging of  the head and cervical spine was performed following the standard protocol without intravenous contrast. Multiplanar CT image reconstructions of the cervical spine were also generated. COMPARISON:  03/15/2015 FINDINGS: CT HEAD FINDINGS Severe diffuse atrophy. Encephalomalacia left occipital lobe. No evidence of acute infarct or mass. No hemorrhage or extra-axial fluid. No skull fracture. Visualized portions of the paranasal sinuses are clear. CT CERVICAL SPINE FINDINGS Stable grade 1 anterior listhesis of C4 on C5 stable degenerative disc disease throughout the cervical spine most severe at C5-6 and C6-7. Stable mild compression deformity involving the superior endplate of T1. Stable large bilateral multinodular thyroid goiter. Unchanged measurements from 03/15/2015. IMPRESSION: No change when compared to recent prior study. No acute intracranial abnormalities. There is stable chronic involutional change and left occipital lobe encephalomalacia. Stable mild T1 compression deformity. Electronically Signed   By: Skipper Cliche M.D.   On: 03/20/2015 17:40   Ct Head Wo Contrast  03/15/2015  CLINICAL DATA:  Status post fall, denies loss consciousness EXAM: CT HEAD WITHOUT CONTRAST CT CERVICAL SPINE WITHOUT CONTRAST TECHNIQUE: Multidetector CT imaging of the head and cervical spine was performed following the standard protocol without intravenous contrast. Multiplanar CT image reconstructions of the cervical spine were also generated. COMPARISON:  None. FINDINGS: CT HEAD FINDINGS There is no evidence of mass effect, midline shift, or extra-axial fluid collections. There is no evidence of a space-occupying lesion or intracranial hemorrhage. There is no evidence of a cortical-based area of acute infarction. There is an old right parietal lobe infarct with encephalomalacia. There is generalized cerebral atrophy. There is periventricular white matter low attenuation likely secondary to microangiopathy. The ventricles and  sulci are appropriate for the patient's age. The basal cisterns are patent. Visualized portions of the orbits are unremarkable. The visualized portions of the paranasal sinuses and mastoid air cells are unremarkable. Cerebrovascular atherosclerotic calcifications are noted. The osseous structures are unremarkable. CT CERVICAL SPINE FINDINGS The alignment is anatomic. There is a compression fracture of the T1 vertebral body with approximately 10% height loss. There is 4 mm of anterolisthesis of C4 on C5 secondary to facet disease. The prevertebral soft tissues are normal. The intraspinal soft tissues are not fully imaged on this examination due to poor soft tissue contrast, but there is no gross soft tissue abnormality. Degenerative disc disease with disc height loss at C5-6 and C6-7. There is osseous fusion of the posterior elements at C2-3. There is moderate bilateral facet arthropathy at C3-4. There is bilateral osseous fusion of the posterior elements at C4-5. There is bilateral facet arthropathy at C5-6, C6-7, C7-T1 and T1-2. There is a broad-based disc osteophyte complex at C6-7. The visualized portions of the lung apices demonstrate no focal abnormality. Severe enlargement of the right thyroid gland measuring 7 x 4.4 cm and rightward deviation of the trachea. Nodular in enlargement of the left thyroid gland measures 3.6 x 2.4 cm. IMPRESSION: 1. No acute intracranial pathology. 2. Compression fracture of the T1 vertebral body with approximately 10% height loss. 3. Large multinodular thyroid goiter. Electronically Signed   By: Kathreen Devoid   On: 03/15/2015 16:06   Ct Cervical Spine Wo Contrast  03/20/2015  CLINICAL DATA:  [place] today, found lying on floor, denies loss of consciousness, mild confusion EXAM: CT HEAD WITHOUT CONTRAST CT CERVICAL SPINE WITHOUT CONTRAST TECHNIQUE: Multidetector CT imaging of the head and cervical spine  was performed following the standard protocol without intravenous contrast.  Multiplanar CT image reconstructions of the cervical spine were also generated. COMPARISON:  03/15/2015 FINDINGS: CT HEAD FINDINGS Severe diffuse atrophy. Encephalomalacia left occipital lobe. No evidence of acute infarct or mass. No hemorrhage or extra-axial fluid. No skull fracture. Visualized portions of the paranasal sinuses are clear. CT CERVICAL SPINE FINDINGS Stable grade 1 anterior listhesis of C4 on C5 stable degenerative disc disease throughout the cervical spine most severe at C5-6 and C6-7. Stable mild compression deformity involving the superior endplate of T1. Stable large bilateral multinodular thyroid goiter. Unchanged measurements from 03/15/2015. IMPRESSION: No change when compared to recent prior study. No acute intracranial abnormalities. There is stable chronic involutional change and left occipital lobe encephalomalacia. Stable mild T1 compression deformity. Electronically Signed   By: Skipper Cliche M.D.   On: 03/20/2015 17:40   Ct Cervical Spine Wo Contrast  03/15/2015  CLINICAL DATA:  Status post fall, denies loss consciousness EXAM: CT HEAD WITHOUT CONTRAST CT CERVICAL SPINE WITHOUT CONTRAST TECHNIQUE: Multidetector CT imaging of the head and cervical spine was performed following the standard protocol without intravenous contrast. Multiplanar CT image reconstructions of the cervical spine were also generated. COMPARISON:  None. FINDINGS: CT HEAD FINDINGS There is no evidence of mass effect, midline shift, or extra-axial fluid collections. There is no evidence of a space-occupying lesion or intracranial hemorrhage. There is no evidence of a cortical-based area of acute infarction. There is an old right parietal lobe infarct with encephalomalacia. There is generalized cerebral atrophy. There is periventricular white matter low attenuation likely secondary to microangiopathy. The ventricles and sulci are appropriate for the patient's age. The basal cisterns are patent. Visualized portions  of the orbits are unremarkable. The visualized portions of the paranasal sinuses and mastoid air cells are unremarkable. Cerebrovascular atherosclerotic calcifications are noted. The osseous structures are unremarkable. CT CERVICAL SPINE FINDINGS The alignment is anatomic. There is a compression fracture of the T1 vertebral body with approximately 10% height loss. There is 4 mm of anterolisthesis of C4 on C5 secondary to facet disease. The prevertebral soft tissues are normal. The intraspinal soft tissues are not fully imaged on this examination due to poor soft tissue contrast, but there is no gross soft tissue abnormality. Degenerative disc disease with disc height loss at C5-6 and C6-7. There is osseous fusion of the posterior elements at C2-3. There is moderate bilateral facet arthropathy at C3-4. There is bilateral osseous fusion of the posterior elements at C4-5. There is bilateral facet arthropathy at C5-6, C6-7, C7-T1 and T1-2. There is a broad-based disc osteophyte complex at C6-7. The visualized portions of the lung apices demonstrate no focal abnormality. Severe enlargement of the right thyroid gland measuring 7 x 4.4 cm and rightward deviation of the trachea. Nodular in enlargement of the left thyroid gland measures 3.6 x 2.4 cm. IMPRESSION: 1. No acute intracranial pathology. 2. Compression fracture of the T1 vertebral body with approximately 10% height loss. 3. Large multinodular thyroid goiter. Electronically Signed   By: Kathreen Devoid   On: 03/15/2015 16:06   Dg Hip Unilat With Pelvis 2-3 Views Right  03/20/2015  CLINICAL DATA:  Found down today at nursing home. EXAM: CHEST  1 VIEW; DG HIP (WITH OR WITHOUT PELVIS) 2-3V RIGHT COMPARISON:  Chest x-ray 03/15/2015 FINDINGS: One view chest: The cardiac silhouette, mediastinal and hilar contours are within normal limits and stable. There is tortuosity of the thoracic aorta. Stable moderate-sized hiatal hernia. No acute pulmonary findings.  The bony thorax  is intact. Stable advanced degenerative changes involving both shoulders. Pelvis/right hip: There is a displaced intertrochanteric fracture of the right hip. Moderate varus deformity. The left hip is intact. The pubic symphysis and SI joints are intact. No definite pelvic fractures. IMPRESSION: 1. Displaced intertrochanteric fracture of the right hip. 2. No acute cardiopulmonary findings. Electronically Signed   By: Marijo Sanes M.D.   On: 03/20/2015 17:57      Imaging Review Dg Chest 1 View  03/20/2015  CLINICAL DATA:  Found down today at nursing home. EXAM: CHEST  1 VIEW; DG HIP (WITH OR WITHOUT PELVIS) 2-3V RIGHT COMPARISON:  Chest x-ray 03/15/2015 FINDINGS: One view chest: The cardiac silhouette, mediastinal and hilar contours are within normal limits and stable. There is tortuosity of the thoracic aorta. Stable moderate-sized hiatal hernia. No acute pulmonary findings. The bony thorax is intact. Stable advanced degenerative changes involving both shoulders. Pelvis/right hip: There is a displaced intertrochanteric fracture of the right hip. Moderate varus deformity. The left hip is intact. The pubic symphysis and SI joints are intact. No definite pelvic fractures. IMPRESSION: 1. Displaced intertrochanteric fracture of the right hip. 2. No acute cardiopulmonary findings. Electronically Signed   By: Marijo Sanes M.D.   On: 03/20/2015 17:57   Ct Head Wo Contrast  03/20/2015  CLINICAL DATA:  [place] today, found lying on floor, denies loss of consciousness, mild confusion EXAM: CT HEAD WITHOUT CONTRAST CT CERVICAL SPINE WITHOUT CONTRAST TECHNIQUE: Multidetector CT imaging of the head and cervical spine was performed following the standard protocol without intravenous contrast. Multiplanar CT image reconstructions of the cervical spine were also generated. COMPARISON:  03/15/2015 FINDINGS: CT HEAD FINDINGS Severe diffuse atrophy. Encephalomalacia left occipital lobe. No evidence of acute infarct or mass. No  hemorrhage or extra-axial fluid. No skull fracture. Visualized portions of the paranasal sinuses are clear. CT CERVICAL SPINE FINDINGS Stable grade 1 anterior listhesis of C4 on C5 stable degenerative disc disease throughout the cervical spine most severe at C5-6 and C6-7. Stable mild compression deformity involving the superior endplate of T1. Stable large bilateral multinodular thyroid goiter. Unchanged measurements from 03/15/2015. IMPRESSION: No change when compared to recent prior study. No acute intracranial abnormalities. There is stable chronic involutional change and left occipital lobe encephalomalacia. Stable mild T1 compression deformity. Electronically Signed   By: Skipper Cliche M.D.   On: 03/20/2015 17:40   Ct Cervical Spine Wo Contrast  03/20/2015  CLINICAL DATA:  [place] today, found lying on floor, denies loss of consciousness, mild confusion EXAM: CT HEAD WITHOUT CONTRAST CT CERVICAL SPINE WITHOUT CONTRAST TECHNIQUE: Multidetector CT imaging of the head and cervical spine was performed following the standard protocol without intravenous contrast. Multiplanar CT image reconstructions of the cervical spine were also generated. COMPARISON:  03/15/2015 FINDINGS: CT HEAD FINDINGS Severe diffuse atrophy. Encephalomalacia left occipital lobe. No evidence of acute infarct or mass. No hemorrhage or extra-axial fluid. No skull fracture. Visualized portions of the paranasal sinuses are clear. CT CERVICAL SPINE FINDINGS Stable grade 1 anterior listhesis of C4 on C5 stable degenerative disc disease throughout the cervical spine most severe at C5-6 and C6-7. Stable mild compression deformity involving the superior endplate of T1. Stable large bilateral multinodular thyroid goiter. Unchanged measurements from 03/15/2015. IMPRESSION: No change when compared to recent prior study. No acute intracranial abnormalities. There is stable chronic involutional change and left occipital lobe encephalomalacia. Stable mild  T1 compression deformity. Electronically Signed   By: Elodia Florence.D.  On: 03/20/2015 17:40   Dg Hip Unilat With Pelvis 2-3 Views Right  03/20/2015  CLINICAL DATA:  Found down today at nursing home. EXAM: CHEST  1 VIEW; DG HIP (WITH OR WITHOUT PELVIS) 2-3V RIGHT COMPARISON:  Chest x-ray 03/15/2015 FINDINGS: One view chest: The cardiac silhouette, mediastinal and hilar contours are within normal limits and stable. There is tortuosity of the thoracic aorta. Stable moderate-sized hiatal hernia. No acute pulmonary findings. The bony thorax is intact. Stable advanced degenerative changes involving both shoulders. Pelvis/right hip: There is a displaced intertrochanteric fracture of the right hip. Moderate varus deformity. The left hip is intact. The pubic symphysis and SI joints are intact. No definite pelvic fractures. IMPRESSION: 1. Displaced intertrochanteric fracture of the right hip. 2. No acute cardiopulmonary findings. Electronically Signed   By: Marijo Sanes M.D.   On: 03/20/2015 17:57   I have personally reviewed and evaluated these images and lab results as part of my medical decision-making.   EKG Interpretation   Date/Time:  Monday March 20 2015 17:03:34 EDT Ventricular Rate:  85 PR Interval:    QRS Duration: 149 QT Interval:  419 QTC Calculation: 498 R Axis:   96 Text Interpretation:  Atrial fibrillation RBBB and LPFB SINCE LAST TRACING  HEART RATE HAS INCREASED Confirmed by Elsa Ploch  MD, Marcy Bogosian ([id number]) on  03/20/2015 7:02:58 PM      MDM   Final diagnoses:  Hip fracture, right, closed, initial encounter ([place])  Syncope, unspecified syncope type  UTI (lower urinary tract infection)   Patient on eloquent percents after a fall/syncopal episode with hip pain. She has evidence of a right intertrochanteric hip fracture. There is no evidence of intracranial hemorrhage. No other evident injuries. Her tetanus shot is up-to-date. Her hemoglobin has dropped from her last recent  hemoglobin about a week ago. Her Hemoccult is negative. Her urine does appear infected and she was given dose of Rocephin. Her urine was sent for culture. I spoke with Dr. Ricki Rodriguez who will see the patient regarding her hip fracture. I discussed the case with Dr. Loleta Books with the hospitalist service at [facility] to his accepted patient for admission to [facility]. He requests a telemetry bed.  I personally performed the services described in this documentation, which was scribed in my presence.  The recorded information has been reviewed and considered.      Malvin Johns, MD 03/20/15 [phone]

## 2015-03-20 NOTE — ED Notes (Addendum)
Pt found on floor this morning by staff. Pt lives in independent living at [facility]. Pt reported to EMS that she had been on the floor since 0300 but does not recall falling. C/o right leg pain. Pt received 150 mcg of fentanyl pta

## 2015-03-20 NOTE — ED Notes (Signed)
Pt transferred to [facility] via [facility]

## 2015-03-21 ENCOUNTER — Encounter (HOSPITAL_COMMUNITY): Payer: Self-pay

## 2015-03-21 ENCOUNTER — Ambulatory Visit: Payer: Self-pay | Admitting: Orthopedic Surgery

## 2015-03-21 DIAGNOSIS — N390 Urinary tract infection, site not specified: Secondary | ICD-10-CM | POA: Diagnosis present

## 2015-03-21 LAB — BASIC METABOLIC PANEL
ANION GAP: 10 (ref 5–15)
BUN: 46 mg/dL — ABNORMAL HIGH (ref 6–20)
CO2: 23 mmol/L (ref 22–32)
Calcium: 7.9 mg/dL — ABNORMAL LOW (ref 8.9–10.3)
Chloride: 107 mmol/L (ref 101–111)
Creatinine, Ser: 1.85 mg/dL — ABNORMAL HIGH (ref 0.44–1.00)
GFR calc Af Amer: 26 mL/min — ABNORMAL LOW (ref >60)
GFR calc non Af Amer: 23 mL/min — ABNORMAL LOW (ref >60)
GLUCOSE: 114 mg/dL — AB (ref 65–99)
POTASSIUM: 4.7 mmol/L (ref 3.5–5.1)
Sodium: 140 mmol/L (ref 135–145)

## 2015-03-21 LAB — IRON AND TIBC
Iron: 15 ug/dL — ABNORMAL LOW (ref 28–170)
SATURATION RATIOS: 5 % — AB (ref 10.4–31.8)
TIBC: 319 ug/dL (ref 250–450)
UIBC: 304 ug/dL

## 2015-03-21 LAB — CBC
HEMATOCRIT: 24.2 % — AB (ref 36.0–46.0)
HEMOGLOBIN: 7.8 g/dL — AB (ref 12.0–15.0)
MCH: 29 pg (ref 26.0–34.0)
MCHC: 32.2 g/dL (ref 30.0–36.0)
MCV: 90 fL (ref 78.0–100.0)
Platelets: 215 10*3/uL (ref 150–400)
RBC: 2.69 MIL/uL — ABNORMAL LOW (ref 3.87–5.11)
RDW: 16.9 % — ABNORMAL HIGH (ref 11.5–15.5)
WBC: 11.2 10*3/uL — ABNORMAL HIGH (ref 4.0–10.5)

## 2015-03-21 LAB — FERRITIN: FERRITIN: 31 ng/mL (ref 11–307)

## 2015-03-21 LAB — MRSA PCR SCREENING: MRSA by PCR: NEGATIVE

## 2015-03-21 LAB — FOLATE: Folate: 13.9 ng/mL (ref >5.9)

## 2015-03-21 LAB — VITAMIN B12: Vitamin B-12: 427 pg/mL (ref 180–914)

## 2015-03-21 LAB — ABO/RH: ABO/RH(D): O POS

## 2015-03-21 LAB — PREPARE RBC (CROSSMATCH)

## 2015-03-21 LAB — TSH: TSH: 0.183 u[IU]/mL — AB (ref 0.350–4.500)

## 2015-03-21 MED ORDER — SODIUM CHLORIDE 0.9 % IV SOLN: Freq: Once | INTRAVENOUS | Status: DC

## 2015-03-21 MED ORDER — ACETAMINOPHEN 325 MG PO TABS
650.0000 mg | ORAL_TABLET | Freq: Once | ORAL | Status: AC
  Administered 2015-03-21: 650 mg via ORAL
  Filled 2015-03-21: qty 2

## 2015-03-21 MED ORDER — ENSURE ENLIVE PO LIQD: 237.0000 mL | ORAL | Status: DC

## 2015-03-21 MED ORDER — CEFAZOLIN (ANCEF) 1 G IV SOLR: 1.0000 g | INTRAVENOUS | Status: AC

## 2015-03-21 NOTE — Progress Notes (Signed)
[facility] PROGRESS NOTE  Ranelle Champlain [id number] DOB: Jul 14, 1920 DOA: 03/20/2015 PCP: Tammi Sou, MD  Brief Summary  Tamyka Lubold is a 82 y.o. female with a past medical history significant for Afib on apixaban, hypothyroidism, dementia, and HTN who presented with hip pain after a fall.  Patient was delirious on arrival but was found on the floor of her facility by staff in the morning.  The sons were aware of no other complaints of chest pain, dizziness, pre-syncope, or micturation preceding the fall, but note that this is her second fall requiring ER visit in the last week.  In the ED, the patient was hemodynamically stable and a plain radiograph of the right hip showed a displaced intertrochanteric fracture. Orthopedics was consulted, however, patient is declining surgery.    Assessment/Plan  Right intertrochanteric hip fracture, -  Appreciate Orthopedics assistance -  Although patient is declining surgery, she currently does not have a capacity to make the decision.  She is intermittently confused and cannot explain the options or the implications of the options of Surgery vs. No surgery.  Discussed the options with the son who would like to pursue surgery. -  Regular diet today and NPO at [place] - Hydrocodone-acetaminophen or morphine as tolerated for pain -Bed rest, apply ice, document sedation and vitals per Hip fracture protocol -NPO at midnight -MIVF   Normocytic anemia, occult stool negative -  CBC today and will determine need and volume of transfusion based on today's labs -  Iron studies, B12, folate -  TSH -  Repeat hgb in AM  Possible UTI: - f/u Urine culture  - Given ceftriaxone on 3/13 and starting keflex today  Acute delirium due to medications, fracture/pain, possible UTI, hospitalization in setting of dementia -Conservative measures and adequate pain control  Chronic atrial fibrillation: CHADS2Vasc 4. -Hold eliquis -Continue atenolol and  statin  Diet:  Dysphagia 3 Access:  PIV IVF:  yes Proph:  SCDs  Code Status: DNR Family Communication: patient and her son [name] who was at bedside  Disposition Plan:  To SNF pending surgery tomorrow, PT/OT assessments, likely discharge on Saturday    Consultants:  Orthopedic surgery  Procedures:  CT head  CT cervical spine  Chest x-ray  X-ray pelvis  Antibiotics:  Ceftriaxone 3/131  Keflex 3/14   HPI/Subjective:  Patient is confused. She denies pain, shortness of breath, nausea. When discussing her leg fracture, she states she does not want surgery. I attempted to explain the options were surgery versus no surgery, and if she has surgery she will have a prolonged recovery at a skilled nursing facility. If she does not have surgery she will be bed ridden. She was unable to repeat the 2 options back to me nor the implications of the 2 decisions. She does not have capacity to make this decision.  Objective: Filed Vitals:   03/20/15 2040 03/21/15 1100 03/21/15 1159 03/21/15 1325  BP: 144/85  120/78 119/72  Pulse: 93  97 82  Temp: 98.1 F (36.7 C)   98.8 F (37.1 C)  TempSrc: Oral   Oral  Resp: 15   16  Height:  5\' 4"  (1.626 m)    Weight:  53.978 kg (119 lb)    SpO2: 96%   97%    Intake/Output Summary (Last 24 hours) at 03/21/15 1423 Last data filed at 03/21/15 0700  Gross per 24 hour  Intake 806.67 ml  Output      0 ml  Net 806.67 ml  Filed Weights   03/21/15 1100  Weight: 53.978 kg (119 lb)   Body mass index is 20.42 kg/(m^2).  Exam:   General:  Thin female, No acute distress  HEENT:  NCAT, MMM  Cardiovascular:  IRRR, nl S1, S2 no mrg, 2+ pulses, warm extremities  Respiratory:  CTAB, no increased WOB  Abdomen:   NABS, soft, NT/ND  MSK:   Normal tone and bulk, right lower extremity is foreshortened and externally rotated. She has tenderness to palpation on the lateral hip. 2+ pulses, less than 2 second cap refill. She is able to wiggle her  toes and ankle on the right foot. Sensation is intact.  Neuro:  Grossly intact  Data Reviewed: Basic Metabolic Panel:  Recent Labs Lab 03/15/15 1505 03/20/15 1700  NA 141 137  K 4.4 4.1  CL 107 103  CO2 26 22  GLUCOSE 113* 153*  BUN 29* 37*  CREATININE 1.37* 1.37*  CALCIUM 8.4* 8.3*   Liver Function Tests:  Recent Labs Lab 03/20/15 1700  AST 27  ALT 15  ALKPHOS 51  BILITOT 0.8  PROT 6.4*  ALBUMIN 3.5   No results for input(s): LIPASE, AMYLASE in the last 168 hours. No results for input(s): AMMONIA in the last 168 hours. CBC:  Recent Labs Lab 03/15/15 1505 03/20/15 1700  WBC 7.0 13.3*  NEUTROABS 5.0 11.7*  HGB 10.4* 8.6*  HCT 32.8* 26.6*  MCV 91.1 88.4  PLT 264 243    Recent Results (from the past 240 hour(s))  MRSA PCR Screening     Status: None   Collection Time: 03/21/15  5:14 AM  Result Value Ref Range Status   MRSA by PCR NEGATIVE NEGATIVE Final    Comment:        The GeneXpert MRSA Assay (FDA approved for NASAL specimens only), is one component of a comprehensive MRSA colonization surveillance program. It is not intended to diagnose MRSA infection nor to guide or monitor treatment for MRSA infections.      Studies: Dg Chest 1 View  03/20/2015  CLINICAL DATA:  Found down today at nursing home. EXAM: CHEST  1 VIEW; DG HIP (WITH OR WITHOUT PELVIS) 2-3V RIGHT COMPARISON:  Chest x-ray 03/15/2015 FINDINGS: One view chest: The cardiac silhouette, mediastinal and hilar contours are within normal limits and stable. There is tortuosity of the thoracic aorta. Stable moderate-sized hiatal hernia. No acute pulmonary findings. The bony thorax is intact. Stable advanced degenerative changes involving both shoulders. Pelvis/right hip: There is a displaced intertrochanteric fracture of the right hip. Moderate varus deformity. The left hip is intact. The pubic symphysis and SI joints are intact. No definite pelvic fractures. IMPRESSION: 1. Displaced  intertrochanteric fracture of the right hip. 2. No acute cardiopulmonary findings. Electronically Signed   By: Marijo Sanes M.D.   On: 03/20/2015 17:57   Ct Head Wo Contrast  03/20/2015  CLINICAL DATA:  [place] today, found lying on floor, denies loss of consciousness, mild confusion EXAM: CT HEAD WITHOUT CONTRAST CT CERVICAL SPINE WITHOUT CONTRAST TECHNIQUE: Multidetector CT imaging of the head and cervical spine was performed following the standard protocol without intravenous contrast. Multiplanar CT image reconstructions of the cervical spine were also generated. COMPARISON:  03/15/2015 FINDINGS: CT HEAD FINDINGS Severe diffuse atrophy. Encephalomalacia left occipital lobe. No evidence of acute infarct or mass. No hemorrhage or extra-axial fluid. No skull fracture. Visualized portions of the paranasal sinuses are clear. CT CERVICAL SPINE FINDINGS Stable grade 1 anterior listhesis of C4 on C5 stable degenerative  disc disease throughout the cervical spine most severe at C5-6 and C6-7. Stable mild compression deformity involving the superior endplate of T1. Stable large bilateral multinodular thyroid goiter. Unchanged measurements from 03/15/2015. IMPRESSION: No change when compared to recent prior study. No acute intracranial abnormalities. There is stable chronic involutional change and left occipital lobe encephalomalacia. Stable mild T1 compression deformity. Electronically Signed   By: Skipper Cliche M.D.   On: 03/20/2015 17:40   Ct Cervical Spine Wo Contrast  03/20/2015  CLINICAL DATA:  [place] today, found lying on floor, denies loss of consciousness, mild confusion EXAM: CT HEAD WITHOUT CONTRAST CT CERVICAL SPINE WITHOUT CONTRAST TECHNIQUE: Multidetector CT imaging of the head and cervical spine was performed following the standard protocol without intravenous contrast. Multiplanar CT image reconstructions of the cervical spine were also generated. COMPARISON:  03/15/2015 FINDINGS: CT HEAD FINDINGS Severe  diffuse atrophy. Encephalomalacia left occipital lobe. No evidence of acute infarct or mass. No hemorrhage or extra-axial fluid. No skull fracture. Visualized portions of the paranasal sinuses are clear. CT CERVICAL SPINE FINDINGS Stable grade 1 anterior listhesis of C4 on C5 stable degenerative disc disease throughout the cervical spine most severe at C5-6 and C6-7. Stable mild compression deformity involving the superior endplate of T1. Stable large bilateral multinodular thyroid goiter. Unchanged measurements from 03/15/2015. IMPRESSION: No change when compared to recent prior study. No acute intracranial abnormalities. There is stable chronic involutional change and left occipital lobe encephalomalacia. Stable mild T1 compression deformity. Electronically Signed   By: Skipper Cliche M.D.   On: 03/20/2015 17:40   Dg Hip Unilat With Pelvis 2-3 Views Right  03/20/2015  CLINICAL DATA:  Found down today at nursing home. EXAM: CHEST  1 VIEW; DG HIP (WITH OR WITHOUT PELVIS) 2-3V RIGHT COMPARISON:  Chest x-ray 03/15/2015 FINDINGS: One view chest: The cardiac silhouette, mediastinal and hilar contours are within normal limits and stable. There is tortuosity of the thoracic aorta. Stable moderate-sized hiatal hernia. No acute pulmonary findings. The bony thorax is intact. Stable advanced degenerative changes involving both shoulders. Pelvis/right hip: There is a displaced intertrochanteric fracture of the right hip. Moderate varus deformity. The left hip is intact. The pubic symphysis and SI joints are intact. No definite pelvic fractures. IMPRESSION: 1. Displaced intertrochanteric fracture of the right hip. 2. No acute cardiopulmonary findings. Electronically Signed   By: Marijo Sanes M.D.   On: 03/20/2015 17:57    Scheduled Meds: . atenolol  50 mg Oral Daily  . cephALEXin  500 mg Oral 3 times per day  . feeding supplement (ENSURE ENLIVE)  237 mL Oral Q24H  . levothyroxine  75 mcg Oral Daily  . pravastatin   40 mg Oral QHS   Continuous Infusions: . sodium chloride 100 mL/hr at 03/21/15 1158    Active Problems:   Essential hypertension, benign   Hypothyroidism   Chronic atrial fibrillation (HCC)   Hip fracture (HCC)   UTI (lower urinary tract infection)    Time spent: 30 min    Quintus Premo, [facility] Pager [phone]. If 7PM-7AM, please contact night-coverage at [email or web address], password [facility] 03/21/2015, 2:23 PM  LOS: 1 day

## 2015-03-21 NOTE — Consult Note (Signed)
Reason for Consult:Right hip fracture following fall. Referring Physician: Edwin Dada, MD  Marie Robertson is an 82 y.o. female.  HPI: Patient is a 82 year old female that was admitted by Medicine service for injuries sustained following a fall.  She was found on the floor and transported to the ED at [facility] where she was evaluated on found to have sustained a displaced, angulated right intertrochanteric hip fracture.  It was documented in the notes from yesterday that the patient was delirious yesterday afternoon.  She was seen in morning rounds by Dr. Wynelle Link this morning. She was evaluated by Dr. Wynelle Link and questioned concerning th events surrounding the fall.  She did not recall any syncopal or presyncopal episode surrounding the fall. The fracture was discussed with the patient and she was informed of the different treatment options.  In most cases, surgical intervention is usually recommended for this type of unstable fracture pattern.  The patient verbalized that she did not want to have surgery.  "I had a good life".  Dr. Wynelle Link discussed this with her several times and she denied surgical treatment each time.  She was encouraged to discuss these options with her family.  Past Medical History  Diagnosis Date  . Osteoarthritis   . Depression   . Hypertension   . Chronic atrial fibrillation ([place])   . Hypothyroidism   . Constipation   . Insomnia   . Hyperlipidemia   . Osteoporosis   . CVA (cerebral infarction) approx 2012    residual defect: poor R sided peripheral vision per son    Past Surgical History  Procedure Laterality Date  . Appendectomy    . Knee arthroscopy Right   . Thyroid surgery    . Colonoscopy  08/22/08    Family History  Problem Relation Age of Onset  . Stroke Mother   . Diabetes Mother   . Alcohol abuse Father     Social History:  reports that she has never smoked. She has never used smokeless tobacco. She reports that she does  not drink alcohol or use illicit drugs.  Allergies: No Known Allergies  Medications:   atenolol (TENORMIN) tablet 50 mg - 50 mg, Oral, Daily, Cholecalciferol (VITAMIN D-3) 1000 units CAPS Take 3,000 Units by mouth daily.   ELIQUIS 2.5 MG TABS tablet Take 2.5 mg by mouth 2 (two) times daily. Edwin Dada, MD Not Ordered levothyroxine    levothyroxine (SYNTHROID, LEVOTHROID) tablet 75 mcg - 75 mcg, Oral, Daily,   pravastatin (PRAVACHOL) tablet 40 mg - 40 mg, Oral, Daily at bedtime, zolpidem (AMBIEN)  Take 5 mg by mouth at bedtime as needed for sleep.    Results for orders placed or performed during the hospital encounter of 03/20/15 (from the past 48 hour(s))  CBC with Differential     Status: Abnormal   Collection Time: 03/20/15  5:00 PM  Result Value Ref Range   WBC 13.3 (H) 4.0 - 10.5 K/uL   RBC 3.01 (L) 3.87 - 5.11 MIL/uL   Hemoglobin 8.6 (L) 12.0 - 15.0 g/dL   HCT 26.6 (L) 36.0 - 46.0 %   MCV 88.4 78.0 - 100.0 fL   MCH 28.6 26.0 - 34.0 pg   MCHC 32.3 30.0 - 36.0 g/dL   RDW 15.7 (H) 11.5 - 15.5 %   Platelets 243 150 - 400 K/uL   Neutrophils Relative % 88 %   Neutro Abs 11.7 (H) 1.7 - 7.7 K/uL   Lymphocytes Relative 5 %  Lymphs Abs 0.6 (L) 0.7 - 4.0 K/uL   Monocytes Relative 7 %   Monocytes Absolute 0.9 0.1 - 1.0 K/uL   Eosinophils Relative 0 %   Eosinophils Absolute 0.0 0.0 - 0.7 K/uL   Basophils Relative 0 %   Basophils Absolute 0.0 0.0 - 0.1 K/uL  Comprehensive metabolic panel     Status: Abnormal   Collection Time: 03/20/15  5:00 PM  Result Value Ref Range   Sodium 137 135 - 145 mmol/L   Potassium 4.1 3.5 - 5.1 mmol/L   Chloride 103 101 - 111 mmol/L   CO2 22 22 - 32 mmol/L   Glucose, Bld 153 (H) 65 - 99 mg/dL   BUN 37 (H) 6 - 20 mg/dL   Creatinine, Ser 1.37 (H) 0.44 - 1.00 mg/dL   Calcium 8.3 (L) 8.9 - 10.3 mg/dL   Total Protein 6.4 (L) 6.5 - 8.1 g/dL   Albumin 3.5 3.5 - 5.0 g/dL   AST 27 15 - 41 U/L   ALT 15 14 - 54 U/L   Alkaline Phosphatase 51 38 -  126 U/L   Total Bilirubin 0.8 0.3 - 1.2 mg/dL   GFR calc non Af Amer 32 (L) >60 mL/min   GFR calc Af Amer 38 (L) >60 mL/min    Comment: (NOTE) The eGFR has been calculated using the CKD EPI equation. This calculation has not been validated in all clinical situations. eGFR's persistently <60 mL/min signify possible Chronic Kidney Disease.    Anion gap 12 5 - 15  Troponin I     Status: None   Collection Time: 03/20/15  5:00 PM  Result Value Ref Range   Troponin I <0.03 <0.031 ng/mL    Comment:        NO INDICATION OF MYOCARDIAL INJURY.   CK     Status: None   Collection Time: 03/20/15  5:00 PM  Result Value Ref Range   Total CK 151 38 - 234 U/L  Urinalysis, Routine w reflex microscopic     Status: Abnormal   Collection Time: 03/20/15  6:40 PM  Result Value Ref Range   Color, Urine YELLOW YELLOW   APPearance CLOUDY (A) CLEAR   Specific Gravity, Urine 1.017 1.005 - 1.030   pH 7.5 5.0 - 8.0   Glucose, UA NEGATIVE NEGATIVE mg/dL   Hgb urine dipstick NEGATIVE NEGATIVE   Bilirubin Urine NEGATIVE NEGATIVE   Ketones, ur NEGATIVE NEGATIVE mg/dL   Protein, ur 30 (A) NEGATIVE mg/dL   Nitrite POSITIVE (A) NEGATIVE   Leukocytes, UA LARGE (A) NEGATIVE  Urine microscopic-add on     Status: Abnormal   Collection Time: 03/20/15  6:40 PM  Result Value Ref Range   Squamous Epithelial / LPF 0-5 (A) NONE SEEN   WBC, UA 6-30 0 - 5 WBC/hpf   RBC / HPF NONE SEEN 0 - 5 RBC/hpf   Bacteria, UA MANY (A) NONE SEEN  Occult blood card to lab, stool RN will collect     Status: None   Collection Time: 03/20/15  6:45 PM  Result Value Ref Range   Fecal Occult Bld NEGATIVE NEGATIVE  MRSA PCR Screening     Status: None   Collection Time: 03/21/15  5:14 AM  Result Value Ref Range   MRSA by PCR NEGATIVE NEGATIVE    Comment:        The GeneXpert MRSA Assay (FDA approved for NASAL specimens only), is one component of a comprehensive MRSA colonization surveillance program. It is  not intended to  diagnose MRSA infection nor to guide or monitor treatment for MRSA infections.     Dg Chest 1 View  03/20/2015  CLINICAL DATA:  Found down today at nursing home. EXAM: CHEST  1 VIEW; DG HIP (WITH OR WITHOUT PELVIS) 2-3V RIGHT COMPARISON:  Chest x-ray 03/15/2015 FINDINGS: One view chest: The cardiac silhouette, mediastinal and hilar contours are within normal limits and stable. There is tortuosity of the thoracic aorta. Stable moderate-sized hiatal hernia. No acute pulmonary findings. The bony thorax is intact. Stable advanced degenerative changes involving both shoulders. Pelvis/right hip: There is a displaced intertrochanteric fracture of the right hip. Moderate varus deformity. The left hip is intact. The pubic symphysis and SI joints are intact. No definite pelvic fractures. IMPRESSION: 1. Displaced intertrochanteric fracture of the right hip. 2. No acute cardiopulmonary findings. Electronically Signed   By: Marijo Sanes M.D.   On: 03/20/2015 17:57   Ct Head Wo Contrast  03/20/2015  CLINICAL DATA:  [place] today, found lying on floor, denies loss of consciousness, mild confusion EXAM: CT HEAD WITHOUT CONTRAST CT CERVICAL SPINE WITHOUT CONTRAST TECHNIQUE: Multidetector CT imaging of the head and cervical spine was performed following the standard protocol without intravenous contrast. Multiplanar CT image reconstructions of the cervical spine were also generated. COMPARISON:  03/15/2015 FINDINGS: CT HEAD FINDINGS Severe diffuse atrophy. Encephalomalacia left occipital lobe. No evidence of acute infarct or mass. No hemorrhage or extra-axial fluid. No skull fracture. Visualized portions of the paranasal sinuses are clear. CT CERVICAL SPINE FINDINGS Stable grade 1 anterior listhesis of C4 on C5 stable degenerative disc disease throughout the cervical spine most severe at C5-6 and C6-7. Stable mild compression deformity involving the superior endplate of T1. Stable large bilateral multinodular thyroid goiter.  Unchanged measurements from 03/15/2015. IMPRESSION: No change when compared to recent prior study. No acute intracranial abnormalities. There is stable chronic involutional change and left occipital lobe encephalomalacia. Stable mild T1 compression deformity. Electronically Signed   By: Skipper Cliche M.D.   On: 03/20/2015 17:40   Ct Cervical Spine Wo Contrast  03/20/2015  CLINICAL DATA:  [place] today, found lying on floor, denies loss of consciousness, mild confusion EXAM: CT HEAD WITHOUT CONTRAST CT CERVICAL SPINE WITHOUT CONTRAST TECHNIQUE: Multidetector CT imaging of the head and cervical spine was performed following the standard protocol without intravenous contrast. Multiplanar CT image reconstructions of the cervical spine were also generated. COMPARISON:  03/15/2015 FINDINGS: CT HEAD FINDINGS Severe diffuse atrophy. Encephalomalacia left occipital lobe. No evidence of acute infarct or mass. No hemorrhage or extra-axial fluid. No skull fracture. Visualized portions of the paranasal sinuses are clear. CT CERVICAL SPINE FINDINGS Stable grade 1 anterior listhesis of C4 on C5 stable degenerative disc disease throughout the cervical spine most severe at C5-6 and C6-7. Stable mild compression deformity involving the superior endplate of T1. Stable large bilateral multinodular thyroid goiter. Unchanged measurements from 03/15/2015. IMPRESSION: No change when compared to recent prior study. No acute intracranial abnormalities. There is stable chronic involutional change and left occipital lobe encephalomalacia. Stable mild T1 compression deformity. Electronically Signed   By: Skipper Cliche M.D.   On: 03/20/2015 17:40   Dg Hip Unilat With Pelvis 2-3 Views Right  03/20/2015  CLINICAL DATA:  Found down today at nursing home. EXAM: CHEST  1 VIEW; DG HIP (WITH OR WITHOUT PELVIS) 2-3V RIGHT COMPARISON:  Chest x-ray 03/15/2015 FINDINGS: One view chest: The cardiac silhouette, mediastinal and hilar contours are  within normal limits and stable.  There is tortuosity of the thoracic aorta. Stable moderate-sized hiatal hernia. No acute pulmonary findings. The bony thorax is intact. Stable advanced degenerative changes involving both shoulders. Pelvis/right hip: There is a displaced intertrochanteric fracture of the right hip. Moderate varus deformity. The left hip is intact. The pubic symphysis and SI joints are intact. No definite pelvic fractures. IMPRESSION: 1. Displaced intertrochanteric fracture of the right hip. 2. No acute cardiopulmonary findings. Electronically Signed   By: Marijo Sanes M.D.   On: 03/20/2015 17:57    ROS Blood pressure 144/85, pulse 93, temperature 98.1 F (36.7 C), temperature source Oral, resp. rate 15, last menstrual period 01/08/1972, SpO2 96 %. Physical Exam   82 year old female. Alert on exam.  Below average historian although seems appropriate at time of morning rounds (history of delirium yesterday during the ED visit) Right lower extremity - right leg is shortened on exam and rotated in position of comfort Neurovascular intact. Gross motor function - moving foot and toes on exam Sensation intact to the right leg  DG HIP (WITH OR WITHOUT PELVIS) 2-3V RIGHT IMPRESSION: 1. Displaced intertrochanteric fracture of the right hip.  Assessment/Plan: Displaced, angulated right intertrochanteric hip fracture, closed  Plan: No surgery at this time due to patient refusal, however strongly encouraged her to discuss options with her family.  She is on Eliquis so no surgery would be performed today anyway. The earliest for surgery would be tomorrow afternoon.  Allow diet today.  Hold Eliquis temporarily for now.   HGB is 8.6.  Recommend 2 units of blood for this patient today especially if possible surgery tomorrow. For now, bedrest and conservative measures for hip fracture.  Once final plan determined with patient and the family input, will change orders accordingly.  PERKINS,  ALEXZANDREW 03/21/2015, 9:10 AM

## 2015-03-21 NOTE — Progress Notes (Addendum)
Received a message from Dr. Sheran Fava that patient's son is her POA and he wishes to proceed with surgical fixation of the right intertrochanteric hip fracture.  This will be added on at the end of the afternoon tomorrow (Wed 03/22/2015) so will allow a clear liquid diet in the morning and then NPO after 9 AM tomorrow morning.  Surgical consent order placed and will need to be signed by the son who is the POA.   Her HGB is low at 7.8 on the recheck.  With the nature of this fracture and with surgery, it is recommended that the patient go ahead and receive two units of blood today in preparation of the upcoming surgery tomorrow.  Blood consent will also likely need to be addressed with the son who is POA.  Continue to hold the Eliquis until postop. She received a dose of Rocephin IV upon admission.  Will order Ancef for preop.  Arlee Muslim, PA-C   The family has decided to proceed with fixation of this fracture for pain control and to allow for the potential to ambulate again. Will plan on intramedulary nailing of this fracture tomorrow afternoon/evening

## 2015-03-21 NOTE — Progress Notes (Signed)
Pt refusing medications, cardiac monitor, scd's, lab draws.   Family called and will be here later, pt may be agreeable then. Will continue to monitor.

## 2015-03-21 NOTE — Progress Notes (Addendum)
Initial Nutrition Assessment  DOCUMENTATION CODES:   Not applicable  INTERVENTION:  - Will order Ensure Enlive once/day for when diet is advanced, this supplement provides 350 kcal and 20 grams of protein - RD will continue to monitor for needs  NUTRITION DIAGNOSIS:   Inadequate oral intake related to inability to eat as evidenced by NPO status.  GOAL:   Patient will meet greater than or equal to 90% of their needs  MONITOR:   Diet advancement, PO intake, Weight trends, Labs, Skin, I & O's  REASON FOR ASSESSMENT:   Consult Assessment of nutrition requirement/status  ASSESSMENT:   82 y.o. female with a past medical history significant for Afib on apixaban, hypothyroidism, dementia, and HTN who presents with hip pain after a fall.  Pt seen for consult. BMI indicates normal weight. Pt is NPO but diet likely to be advanced shortly as surgery not to occur today for R hip fx s/p fall. Pt with hx of dementia and is interactive in discussion but does not provide significant information. Information is provided by pt's son who is at bedside. PTA pt was living at independent living facility and had breakfast on her own, typically coffee and toast, and had lunch and dinner in the dining area. Pt has not had anything to eat x2 days, since 03/19/15, per son's report due to fall and hospitalization with NPO status.   Son states that pt had been eating well PTA and she has no swallowing difficulties but does need softer foods due to many missing teeth. She has dentures but they have not been used for an unknown amount of time and need to be cleaned and reassessed for fit. Son reports pt weighed 120 lbs around Christmas-time and has lost weight since then; he reports current weight as 112 lbs with chart review indicating current weight of 119 lbs. Based on son's report, pt has lost 8 lbs (7% body weight) in the past 2.5 months which is significant for time frame. Based on chart review, pt has lost 3  lbs (2.5% body weight) in the past 1 month which is not significant for time frame. Unable to complete physical assessment. Pt may meet criteria for malnutrition but unable to confirm based on nutrition-related parameters at this time.  Not meeting needs while NPO. Medications reviewed; IVF: NS @ 100 mL/hr. Labs reviewed; BUN/creatinine elevated, Ca: 8.3 mg/dL, GFR: 32.   Diet Order:  Diet NPO time specified Except for: Sips with Meds  Skin:  Reviewed, no issues  Last BM:  PTA  Height:   Ht Readings from Last 1 Encounters:  03/21/15 5\' 4"  (1.626 m)    Weight:   Wt Readings from Last 1 Encounters:  03/21/15 119 lb (53.978 kg)    Ideal Body Weight:  54.54 kg (kg)  BMI:  Body mass index is 20.42 kg/(m^2).  Estimated Nutritional Needs:   Kcal:  1100-1300  Protein:  50-60 grams  Fluid:  >/= 1.5 L/day  EDUCATION NEEDS:   No education needs identified at this time     Jarome Matin, RD, LDN Inpatient Clinical Dietitian Pager # [phone] After hours/weekend pager # [phone]

## 2015-03-22 ENCOUNTER — Encounter (HOSPITAL_COMMUNITY): Payer: Self-pay | Admitting: Anesthesiology

## 2015-03-22 ENCOUNTER — Inpatient Hospital Stay (HOSPITAL_COMMUNITY): Payer: Medicare Other | Admitting: Anesthesiology

## 2015-03-22 ENCOUNTER — Inpatient Hospital Stay (HOSPITAL_COMMUNITY): Payer: Medicare Other

## 2015-03-22 ENCOUNTER — Encounter (HOSPITAL_COMMUNITY)
Admission: EM | Disposition: A | Discharge status: Skilled Nursing Facility | Payer: Self-pay | Source: Home / Self Care | Attending: Internal Medicine

## 2015-03-22 DIAGNOSIS — N390 Urinary tract infection, site not specified: Secondary | ICD-10-CM

## 2015-03-22 DIAGNOSIS — I10 Essential (primary) hypertension: Secondary | ICD-10-CM

## 2015-03-22 DIAGNOSIS — I482 Chronic atrial fibrillation: Secondary | ICD-10-CM

## 2015-03-22 DIAGNOSIS — S72001A Fracture of unspecified part of neck of right femur, initial encounter for closed fracture: Secondary | ICD-10-CM

## 2015-03-22 DIAGNOSIS — E039 Hypothyroidism, unspecified: Secondary | ICD-10-CM

## 2015-03-22 HISTORY — PX: INTRAMEDULLARY (IM) NAIL INTERTROCHANTERIC: SHX5875

## 2015-03-22 LAB — BASIC METABOLIC PANEL
ANION GAP: 10 (ref 5–15)
BUN: 47 mg/dL — AB (ref 6–20)
CO2: 20 mmol/L — AB (ref 22–32)
Calcium: 7.8 mg/dL — ABNORMAL LOW (ref 8.9–10.3)
Chloride: 108 mmol/L (ref 101–111)
Creatinine, Ser: 1.82 mg/dL — ABNORMAL HIGH (ref 0.44–1.00)
GFR calc Af Amer: 27 mL/min — ABNORMAL LOW (ref >60)
GFR, EST NON AFRICAN AMERICAN: 23 mL/min — AB (ref >60)
GLUCOSE: 111 mg/dL — AB (ref 65–99)
POTASSIUM: 4.2 mmol/L (ref 3.5–5.1)
Sodium: 138 mmol/L (ref 135–145)

## 2015-03-22 LAB — CBC
HEMATOCRIT: 30.7 % — AB (ref 36.0–46.0)
Hemoglobin: 10.2 g/dL — ABNORMAL LOW (ref 12.0–15.0)
MCH: 29.1 pg (ref 26.0–34.0)
MCHC: 33.2 g/dL (ref 30.0–36.0)
MCV: 87.5 fL (ref 78.0–100.0)
PLATELETS: ADEQUATE 10*3/uL (ref 150–400)
RBC: 3.51 MIL/uL — AB (ref 3.87–5.11)
RDW: 15.7 % — ABNORMAL HIGH (ref 11.5–15.5)
WBC: 12.3 10*3/uL — ABNORMAL HIGH (ref 4.0–10.5)

## 2015-03-22 SURGERY — FIXATION, FRACTURE, INTERTROCHANTERIC, WITH INTRAMEDULLARY ROD
Anesthesia: General | Site: Hip | Laterality: Right

## 2015-03-22 MED ORDER — HALOPERIDOL LACTATE 5 MG/ML IJ SOLN
INTRAMUSCULAR | Status: AC
  Filled 2015-03-22: qty 1

## 2015-03-22 MED ORDER — CEFAZOLIN SODIUM-DEXTROSE 2-3 GM-% IV SOLR
2.0000 g | Freq: Four times a day (QID) | INTRAVENOUS | Status: AC
  Administered 2015-03-22 – 2015-03-23 (×2): 2 g via INTRAVENOUS
  Filled 2015-03-22 (×2): qty 50

## 2015-03-22 MED ORDER — METOCLOPRAMIDE HCL 5 MG/ML IJ SOLN: 5.0000 mg | Freq: Three times a day (TID) | INTRAMUSCULAR | Status: DC | PRN

## 2015-03-22 MED ORDER — FENTANYL CITRATE (PF) 100 MCG/2ML IJ SOLN
25.0000 ug | INTRAMUSCULAR | Status: DC | PRN
  Administered 2015-03-22: 25 ug via INTRAVENOUS

## 2015-03-22 MED ORDER — PHENYLEPHRINE HCL 10 MG/ML IJ SOLN
INTRAMUSCULAR | Status: AC
  Filled 2015-03-22: qty 2

## 2015-03-22 MED ORDER — LIDOCAINE HCL (CARDIAC) 20 MG/ML IV SOLN
INTRAVENOUS | Status: DC | PRN
  Administered 2015-03-22: 50 mg via INTRAVENOUS

## 2015-03-22 MED ORDER — TRAMADOL HCL 50 MG PO TABS: 50.0000 mg | ORAL_TABLET | Freq: Four times a day (QID) | ORAL | Status: DC | PRN

## 2015-03-22 MED ORDER — MEPERIDINE HCL 50 MG/ML IJ SOLN: 6.2500 mg | INTRAMUSCULAR | Status: DC | PRN

## 2015-03-22 MED ORDER — MENTHOL 3 MG MT LOZG: 1.0000 | LOZENGE | OROMUCOSAL | Status: DC | PRN

## 2015-03-22 MED ORDER — PROPOFOL 10 MG/ML IV BOLUS
INTRAVENOUS | Status: DC | PRN
  Administered 2015-03-22: 70 mg via INTRAVENOUS

## 2015-03-22 MED ORDER — PROMETHAZINE HCL 25 MG/ML IJ SOLN: 6.2500 mg | INTRAMUSCULAR | Status: DC | PRN

## 2015-03-22 MED ORDER — CEFAZOLIN SODIUM-DEXTROSE 2-3 GM-% IV SOLR
INTRAVENOUS | Status: DC | PRN
  Administered 2015-03-22: 2 g via INTRAVENOUS

## 2015-03-22 MED ORDER — PHENYLEPHRINE 40 MCG/ML (10ML) SYRINGE FOR IV PUSH (FOR BLOOD PRESSURE SUPPORT)
PREFILLED_SYRINGE | INTRAVENOUS | Status: AC
  Filled 2015-03-22: qty 10

## 2015-03-22 MED ORDER — SUGAMMADEX SODIUM 200 MG/2ML IV SOLN
INTRAVENOUS | Status: AC
  Filled 2015-03-22: qty 2

## 2015-03-22 MED ORDER — SUGAMMADEX SODIUM 200 MG/2ML IV SOLN
INTRAVENOUS | Status: DC | PRN
Start: 2015-03-22 — End: 2015-03-22
  Administered 2015-03-22: 150 mg via INTRAVENOUS

## 2015-03-22 MED ORDER — CEPHALEXIN 500 MG PO CAPS
500.0000 mg | ORAL_CAPSULE | Freq: Three times a day (TID) | ORAL | Status: DC
  Administered 2015-03-23 – 2015-03-24 (×3): 500 mg via ORAL
  Filled 2015-03-22 (×3): qty 1

## 2015-03-22 MED ORDER — ACETAMINOPHEN 325 MG PO TABS
650.0000 mg | ORAL_TABLET | Freq: Four times a day (QID) | ORAL | Status: DC | PRN
  Administered 2015-03-23: 650 mg via ORAL
  Filled 2015-03-22: qty 2

## 2015-03-22 MED ORDER — ROCURONIUM BROMIDE 100 MG/10ML IV SOLN
INTRAVENOUS | Status: DC | PRN
  Administered 2015-03-22: 25 mg via INTRAVENOUS

## 2015-03-22 MED ORDER — ONDANSETRON HCL 4 MG PO TABS: 4.0000 mg | ORAL_TABLET | Freq: Four times a day (QID) | ORAL | Status: DC | PRN

## 2015-03-22 MED ORDER — ONDANSETRON HCL 4 MG/2ML IJ SOLN: 4.0000 mg | Freq: Four times a day (QID) | INTRAMUSCULAR | Status: DC | PRN

## 2015-03-22 MED ORDER — ONDANSETRON HCL 4 MG/2ML IJ SOLN
INTRAMUSCULAR | Status: AC
  Filled 2015-03-22: qty 2

## 2015-03-22 MED ORDER — CEFAZOLIN SODIUM-DEXTROSE 2-3 GM-% IV SOLR
INTRAVENOUS | Status: AC
  Filled 2015-03-22: qty 50

## 2015-03-22 MED ORDER — METOCLOPRAMIDE HCL 10 MG PO TABS: 5.0000 mg | ORAL_TABLET | Freq: Three times a day (TID) | ORAL | Status: DC | PRN

## 2015-03-22 MED ORDER — FENTANYL CITRATE (PF) 100 MCG/2ML IJ SOLN
INTRAMUSCULAR | Status: DC | PRN
  Administered 2015-03-22 (×2): 25 ug via INTRAVENOUS
  Administered 2015-03-22: 50 ug via INTRAVENOUS

## 2015-03-22 MED ORDER — FENTANYL CITRATE (PF) 100 MCG/2ML IJ SOLN
INTRAMUSCULAR | Status: AC
  Filled 2015-03-22: qty 2

## 2015-03-22 MED ORDER — PHENYLEPHRINE HCL 10 MG/ML IJ SOLN
INTRAMUSCULAR | Status: DC | PRN
  Administered 2015-03-22: 80 ug via INTRAVENOUS

## 2015-03-22 MED ORDER — DEXAMETHASONE SODIUM PHOSPHATE 10 MG/ML IJ SOLN
INTRAMUSCULAR | Status: AC
  Filled 2015-03-22: qty 1

## 2015-03-22 MED ORDER — LIDOCAINE HCL (CARDIAC) 20 MG/ML IV SOLN
INTRAVENOUS | Status: AC
  Filled 2015-03-22: qty 5

## 2015-03-22 MED ORDER — ACETAMINOPHEN 10 MG/ML IV SOLN
INTRAVENOUS | Status: DC | PRN
  Administered 2015-03-22: 1000 mg via INTRAVENOUS

## 2015-03-22 MED ORDER — DEXAMETHASONE SODIUM PHOSPHATE 10 MG/ML IJ SOLN
INTRAMUSCULAR | Status: DC | PRN
  Administered 2015-03-22: 10 mg via INTRAVENOUS

## 2015-03-22 MED ORDER — ACETAMINOPHEN 650 MG RE SUPP: 650.0000 mg | Freq: Four times a day (QID) | RECTAL | Status: DC | PRN

## 2015-03-22 MED ORDER — LACTATED RINGERS IV SOLN: INTRAVENOUS | Status: DC | PRN

## 2015-03-22 MED ORDER — APIXABAN 2.5 MG PO TABS
2.5000 mg | ORAL_TABLET | Freq: Two times a day (BID) | ORAL | Status: DC
  Administered 2015-03-23 – 2015-03-24 (×3): 2.5 mg via ORAL
  Filled 2015-03-22 (×3): qty 1

## 2015-03-22 MED ORDER — ACETAMINOPHEN 10 MG/ML IV SOLN
INTRAVENOUS | Status: AC
  Filled 2015-03-22: qty 100

## 2015-03-22 MED ORDER — HALOPERIDOL LACTATE 5 MG/ML IJ SOLN
INTRAMUSCULAR | Status: AC
  Administered 2015-03-22: 5 mg
  Filled 2015-03-22: qty 1

## 2015-03-22 MED ORDER — HALOPERIDOL LACTATE 5 MG/ML IJ SOLN
1.0000 mg | Freq: Once | INTRAMUSCULAR | Status: AC
  Administered 2015-03-22: 1 mg via INTRAMUSCULAR

## 2015-03-22 MED ORDER — PROPOFOL 10 MG/ML IV BOLUS
INTRAVENOUS | Status: AC
  Filled 2015-03-22: qty 20

## 2015-03-22 MED ORDER — ONDANSETRON HCL 4 MG/2ML IJ SOLN
INTRAMUSCULAR | Status: DC | PRN
  Administered 2015-03-22: 4 mg via INTRAVENOUS

## 2015-03-22 MED ORDER — PHENOL 1.4 % MT LIQD: 1.0000 | OROMUCOSAL | Status: DC | PRN

## 2015-03-22 SURGICAL SUPPLY — 38 items
BAG SPEC THK2 15X12 ZIP CLS (MISCELLANEOUS) ×1
BAG ZIPLOCK 12X15 (MISCELLANEOUS) ×3 IMPLANT
BIT DRILL CANN LG 4.3MM (BIT) IMPLANT
BNDG COHESIVE 6X5 TAN STRL LF (GAUZE/BANDAGES/DRESSINGS) ×3 IMPLANT
CLOSURE WOUND 1/2 X4 (GAUZE/BANDAGES/DRESSINGS) ×2
COVER PERINEAL POST (MISCELLANEOUS) ×3 IMPLANT
DRAPE STERI IOBAN 125X83 (DRAPES) ×3 IMPLANT
DRAPE TABLE BACK 44X90 PK DISP (DRAPES) ×3 IMPLANT
DRILL BIT CANN LG 4.3MM (BIT) ×3
DRSG MEPILEX BORDER 4X4 (GAUZE/BANDAGES/DRESSINGS) ×4 IMPLANT
DRSG MEPILEX BORDER 4X8 (GAUZE/BANDAGES/DRESSINGS) ×2 IMPLANT
DRSG PAD ABDOMINAL 8X10 ST (GAUZE/BANDAGES/DRESSINGS) ×3 IMPLANT
DURAPREP 26ML APPLICATOR (WOUND CARE) ×3 IMPLANT
ELECT REM PT RETURN 9FT ADLT (ELECTROSURGICAL) ×3
ELECTRODE REM PT RTRN 9FT ADLT (ELECTROSURGICAL) ×1 IMPLANT
GAUZE SPONGE 4X4 12PLY STRL (GAUZE/BANDAGES/DRESSINGS) ×3 IMPLANT
GLOVE BIO SURGEON STRL SZ7.5 (GLOVE) ×3 IMPLANT
GLOVE BIO SURGEON STRL SZ8 (GLOVE) ×6 IMPLANT
GLOVE BIOGEL PI IND STRL 8 (GLOVE) ×1 IMPLANT
GLOVE BIOGEL PI INDICATOR 8 (GLOVE) ×2
GOWN STRL REUS W/TWL LRG LVL3 (GOWN DISPOSABLE) ×3 IMPLANT
GOWN STRL REUS W/TWL XL LVL3 (GOWN DISPOSABLE) ×3 IMPLANT
GUIDEPIN 3.2X17.5 THRD DISP (PIN) ×2 IMPLANT
HFN 125 DEG 11MM X 180MM (Orthopedic Implant) ×2 IMPLANT
KIT BASIN OR (CUSTOM PROCEDURE TRAY) ×3 IMPLANT
MANIFOLD NEPTUNE II (INSTRUMENTS) ×3 IMPLANT
NS IRRIG 1000ML POUR BTL (IV SOLUTION) ×3 IMPLANT
PACK GENERAL/GYN (CUSTOM PROCEDURE TRAY) ×3 IMPLANT
PADDING CAST COTTON 6X4 STRL (CAST SUPPLIES) ×6 IMPLANT
POSITIONER SURGICAL ARM (MISCELLANEOUS) ×3 IMPLANT
SCREW BONE CORTICAL 5.0X38 (Screw) ×2 IMPLANT
SCREW LAG HIP NAIL 10.5X95 (Screw) ×3 IMPLANT
STRIP CLOSURE SKIN 1/2X4 (GAUZE/BANDAGES/DRESSINGS) ×2 IMPLANT
SUT VIC AB 0 CT1 27 (SUTURE) ×3
SUT VIC AB 0 CT1 27XBRD ANTBC (SUTURE) ×1 IMPLANT
TOWEL OR 17X26 10 PK STRL BLUE (TOWEL DISPOSABLE) ×4 IMPLANT
TRAY FOLEY W/METER SILVER 14FR (SET/KITS/TRAYS/PACK) IMPLANT
TRAY FOLEY W/METER SILVER 16FR (SET/KITS/TRAYS/PACK) IMPLANT

## 2015-03-22 NOTE — Anesthesia Preprocedure Evaluation (Addendum)
Anesthesia Evaluation  Patient identified by MRN, date of birth, ID band Patient awake    Reviewed: Allergy & Precautions, NPO status , Patient's Chart, lab work & pertinent test results, reviewed documented beta blocker date and time   Airway Mallampati: II  TM Distance: >3 FB Neck ROM: Full    Dental no notable dental hx.    Pulmonary neg pulmonary ROS,    Pulmonary exam normal breath sounds clear to auscultation       Cardiovascular hypertension, Pt. on medications and Pt. on home beta blockers Normal cardiovascular exam+ dysrhythmias  Rhythm:Regular Rate:Normal     Neuro/Psych PSYCHIATRIC DISORDERS Depression negative neurological ROS     GI/Hepatic negative GI ROS, Neg liver ROS,   Endo/Other  Hypothyroidism   Renal/GU negative Renal ROS     Musculoskeletal  (+) Arthritis ,   Abdominal   Peds  Hematology negative hematology ROS (+)   Anesthesia Other Findings   Reproductive/Obstetrics negative OB ROS                            Anesthesia Physical Anesthesia Plan  ASA: III  Anesthesia Plan: General   Post-op Pain Management:    Induction:   Airway Management Planned: Oral ETT  Additional Equipment:   Intra-op Plan:   Post-operative Plan: Extubation in OR  Informed Consent: I have reviewed the patients History and Physical, chart, labs and discussed the procedure including the risks, benefits and alternatives for the proposed anesthesia with the patient or authorized representative who has indicated his/her understanding and acceptance.   Dental advisory given  Plan Discussed with: CRNA  Anesthesia Plan Comments:        Anesthesia Quick Evaluation

## 2015-03-22 NOTE — Progress Notes (Signed)
Pt combative, yelling at staff, swinging at staff, and refusing treatment. Will continue to monitor.

## 2015-03-22 NOTE — Progress Notes (Signed)
Pt found with IV removed. Site is clean dry and intact. Pt refuses another IV at this time. Will attempt when pt has calmed down. Will continue to monitor.

## 2015-03-22 NOTE — NC FL2 (Signed)
[place] LEVEL OF CARE SCREENING TOOL     IDENTIFICATION  Patient Name: Marie Robertson Birthdate: 1920-04-15 Sex: female Admission Date (Current Location): 03/20/2015  [facility] and [place] Number:  Herbalist and Address:  [facility],  [place] [address] [place]      Provider Number: [id number]  Attending Physician Name and Address:  Albertine Patricia, MD  Relative Name and Phone Number:       Current Level of Care: Hospital Recommended Level of Care: [place] Prior Approval Number:    Date Approved/Denied:   PASRR Number: [id number]  Discharge Plan: SNF    Current Diagnoses: Patient Active Problem List   Diagnosis Date Noted  . UTI (lower urinary tract infection) 03/21/2015  . Hip fracture ([place]) 03/20/2015  . Chronic atrial fibrillation ([place]) 02/20/2015  . Hyperlipidemia 02/20/2015  . Essential hypertension, benign 12/13/2012  . Cardiac arrhythmia 12/13/2012  . Urinary incontinence 12/13/2012  . Hypothyroidism 12/13/2012  . History of depression 12/11/2012    Orientation RESPIRATION BLADDER Height & Weight     Self, Time, Situation, Place  Normal Indwelling catheter, Continent Weight: 119 lb (53.978 kg) Height:  5\' 4"  (162.6 cm)  BEHAVIORAL SYMPTOMS/MOOD NEUROLOGICAL BOWEL NUTRITION STATUS      Continent Diet (NPO currently - surgery this afternoon, see d/c summary for diet upon discharge)  AMBULATORY STATUS COMMUNICATION OF NEEDS Skin   Extensive Assist Verbally Surgical wounds                       Personal Care Assistance Level of Assistance  Bathing, Dressing Bathing Assistance: Limited assistance   Dressing Assistance: Limited assistance     Functional Limitations Info             SPECIAL CARE FACTORS FREQUENCY  PT (By licensed PT), OT (By licensed OT)     PT Frequency: 5 OT Frequency: 5            Contractures      Additional Factors Info  Code Status,  Allergies Code Status Info: DNR Allergies Info: NKDA           Current Medications (03/22/2015):  This is the current hospital active medication list Current Facility-Administered Medications  Medication Dose Route Frequency Provider Last Rate Last Dose  . 0.9 %  sodium chloride infusion   Intravenous Continuous Janece Canterbury, MD 75 mL/hr at 03/22/15 1042    . 0.9 %  sodium chloride infusion   Intravenous Once Arlee Muslim, PA-C      . atenolol (TENORMIN) tablet 50 mg  50 mg Oral Daily Edwin Dada, MD   50 mg at 03/22/15 0954  . cephALEXin (KEFLEX) capsule 500 mg  500 mg Oral 3 times per day Edwin Dada, MD   500 mg at 03/21/15 2221  . feeding supplement (ENSURE ENLIVE) (ENSURE ENLIVE) liquid 237 mL  237 mL Oral Q24H Janece Canterbury, MD   Stopped at 03/21/15 1230  . haloperidol lactate (HALDOL) 5 MG/ML injection           . HYDROcodone-acetaminophen (NORCO/VICODIN) 5-325 MG per tablet 1-2 tablet  1-2 tablet Oral Q6H PRN Edwin Dada, MD   1 tablet at 03/22/15 0234  . levothyroxine (SYNTHROID, LEVOTHROID) tablet 75 mcg  75 mcg Oral Daily Edwin Dada, MD   75 mcg at 03/22/15 0954  . LORazepam (ATIVAN) injection 0.5 mg  0.5 mg Intravenous Once PRN Edwin Dada, MD      .  morphine 2 MG/ML injection 0.5 mg  0.5 mg Intravenous Q2H PRN Edwin Dada, MD   0.5 mg at 03/22/15 1039  . pravastatin (PRAVACHOL) tablet 40 mg  40 mg Oral QHS Edwin Dada, MD   40 mg at 03/21/15 2221  . senna-docusate (Senokot-S) tablet 1 tablet  1 tablet Oral QHS PRN Edwin Dada, MD       Facility-Administered Medications Ordered in Other Encounters  Medication Dose Route Frequency Provider Last Rate Last Dose  . ceFAZolin (ANCEF) powder 1 g  1 g Other To OR Arlee Muslim, PA-C         Discharge Medications: Please see discharge summary for a list of discharge medications.  Relevant Imaging Results:  Relevant Lab Results:   Additional  Information SSN:   Standley Brooking, LCSW

## 2015-03-22 NOTE — Op Note (Signed)
  OPERATIVE REPORT  PREOPERATIVE DIAGNOSIS: Right intertrochanteric femur fracture.   POSTOP DIAGNOSIS: Right intertrochanteric femur fracture.   PROCEDURE: Intramedullary nailing, Right intertrochanteric femur  fracture.   SURGEON: Gaynelle Arabian, M.D.   ASSISTANT: Alexzandrew L. Perkins, P.A.C.   ANESTHESIA:General  Estimated BLOOD LOSS: 50 ml  DRAINS: None.   COMPLICATIONS:   None  CONDITION: -PACU - hemodynamically stable.    CLINICAL NOTE: Marie Robertson is an 82 y.o. female, who had a fall 2  days ago sustaining a displaced  Right intertrochanteric femur fracture. She has been cleared medically and presents for operative fixation.    PROCEDURE IN DETAIL: After successful administration of  General,  the patient was placed on the fracture table with Right lower extremity in a well-padded traction boot,  Left lower extremity in a well-padded leg holder. Under fluoroscopic guidance, the fracture was reduced. The traction was locked in this position. Thigh was prepped  and draped in the usual sterile fashion. The guide pin for the Biomet  Affixus was then passed percutaneously to the tip of the greater  trochanter, was entered into the femoral canal. It was passed into the  canal. The small incision was made and the starter reamer passed over  the guide pin. This was then removed. The nail which was an 11 mm  diameter short trochanteric nail with 125 degrees angle was attached to  the external guide and then passed into the femoral canal, impacted to  the appropriate depth in the canal, then we used the external guide to  place the lag screw. Through the external guide, a guide pin was  passed. Small incision made, and the guide pin was in the center of the  femoral head on the AP and slightly center to posterior on the lateral.  Length was 95 mm. Triple reamer was passed over the guide pin. 95 mm  lag screw was placed. It was then locked down with a locking screw.   Through the external guide, the distal interlock was placed through the  static hole and this was 38 mm in length with excellent bicortical  purchase. The external guide was then removed. Hardware was in good  position and fracture was well reduced. Wound was copiously irrigated with saline  solution, and  closed deep with interrupted 1 Vicryl, subcu  interrupted 2-0 Vicryl, subcuticular running 4-0 Monocryl. Incision was  cleaned and dried and sterile dressings applied. She was awakened and  transported to recovery in stable condition.   Marie Plover Elridge Stemm, MD    03/22/2015, 6:26 PM

## 2015-03-22 NOTE — Clinical Social Work Note (Signed)
Clinical Social Work Assessment  Patient Details  Name: Marie Robertson MRN: [id number] Date of Birth: 09/09/1920  Date of referral:  03/22/15               Reason for consult:  Facility Placement                Permission sought to share information with:  Facility Art therapist granted to share information::  Yes, Verbal Permission Granted  Name::        Agency::     Relationship::     Contact Information:     Housing/Transportation Living arrangements for the past 2 months:  [place] of Information:  Patient, Adult Children Patient Interpreter Needed:  None Criminal Activity/Legal Involvement Pertinent to Current Situation/Hospitalization:  No - Comment as needed Significant Relationships:  Adult Children Lives with:  Facility Resident Do you feel safe going back to the place where you live?  No Need for family participation in patient care:  Yes (Comment)  Care giving concerns:  CSW received consult that patient was admitted from [place] [facility].    Social Worker assessment / plan:  CSW met with patient & son, Marie Robertson at bedside. CSW reviewed patient's chart indicating that she will be going down for surgery at 1550 today. Anticipating that patient will need SNF upon discharge.   Employment status:  Retired Forensic scientist:  Medicare PT Recommendations:  Not assessed at this time [place] / Referral to community resources:  [place]  Patient/Family's Response to care:  Patient & son are agreeable with plan for SNF - CSW completed FL2 and sent information out to [facility], will follow-up with bed offers in the morning. Patient's son, Marie Robertson to speak with Arlina Robes about SNF preferences.   Patient/Family's Understanding of and Emotional Response to Diagnosis, Current Treatment, and Prognosis:  Patient is awaiting surgery this afternoon, but understands what to expect as  far as rehab.   Emotional Assessment Appearance:  Appears stated age Attitude/Demeanor/Rapport:    Affect (typically observed):    Orientation:  Oriented to Self, Oriented to Place, Oriented to  Time, Oriented to Situation Alcohol / Substance use:    Psych involvement (Current and /or in the community):     Discharge Needs  Concerns to be addressed:    Readmission within the last 30 days:    Current discharge risk:    Barriers to Discharge:      Standley Brooking, LCSW 03/22/2015, 11:51 AM

## 2015-03-22 NOTE — Anesthesia Postprocedure Evaluation (Signed)
Anesthesia Post Note  Patient: Marie Robertson  Procedure(s) Performed: Procedure(s) (LRB): RIGHT femoral HIP NAILING (Right)  Patient location during evaluation: PACU Anesthesia Type: General Level of consciousness: sedated and patient cooperative Pain management: pain level controlled Vital Signs Assessment: post-procedure vital signs reviewed and stable Respiratory status: spontaneous breathing Cardiovascular status: stable Anesthetic complications: no    Last Vitals:  Filed Vitals:   03/22/15 1930 03/22/15 1951  BP: 166/89 150/81  Pulse: 79 84  Temp: 36.5 C 36.5 C  Resp: 10 18    Last Pain:  Filed Vitals:   03/22/15 1951  PainSc: 10-Worst pain ever                 Nolon Nations

## 2015-03-22 NOTE — Anesthesia Procedure Notes (Signed)
Procedure Name: Intubation Date/Time: 03/22/2015 5:31 PM Performed by: Marleni Gallardo, Virgel Gess Pre-anesthesia Checklist: Patient identified, Emergency Drugs available, Suction available and Patient being monitored Patient Re-evaluated:Patient Re-evaluated prior to inductionOxygen Delivery Method: Circle System Utilized Preoxygenation: Pre-oxygenation with 100% oxygen Intubation Type: IV induction Ventilation: Mask ventilation without difficulty Laryngoscope Size: Mac and 3 Grade View: Grade II Tube type: Oral Tube size: 7.5 mm Number of attempts: 1 Airway Equipment and Method: Stylet Placement Confirmation: ETT inserted through vocal cords under direct vision,  positive ETCO2 and breath sounds checked- equal and bilateral Secured at: 21 cm Tube secured with: Tape Dental Injury: Teeth and Oropharynx as per pre-operative assessment

## 2015-03-22 NOTE — Progress Notes (Signed)
   Subjective: Hospital day - 2 Patient lying in bed and dose not appear to be in any distress. Patient seen in rounds for Dr. Wynelle Link. Events in the chart reviewed.  Patient pulled out her IV and was combative with the staff. Did not allow another IV to be restarted.  Did not get her blood yesterday evening. Her son, [name], is at the bedside this morning.  She appears calm on rounds. Hopefully, will be able to get the IV restarted and blood transfusing prior to surgery. Plan is to go to surgery later this afternoon.  Objective: Vital signs in last 24 hours: Temp:  [98.2 F (36.8 C)-98.9 F (37.2 C)] 98.9 F (37.2 C) (03/15 0158) Pulse Rate:  [57-97] 91 (03/15 0338) Resp:  [16-18] 16 (03/15 0338) BP: (103-125)/(64-84) 103/65 mmHg (03/15 0338) SpO2:  [96 %-99 %] 98 % (03/15 0158)  Intake/Output from previous day:  Intake/Output Summary (Last 24 hours) at 03/22/15 1118 Last data filed at 03/22/15 0627  Gross per 24 hour  Intake 1937.75 ml  Output      0 ml  Net 1937.75 ml    Labs:  Recent Labs  03/20/15 1700 03/21/15 1458  HGB 8.6* 7.8*    Recent Labs  03/20/15 1700 03/21/15 1458  WBC 13.3* 11.2*  RBC 3.01* 2.69*  HCT 26.6* 24.2*  PLT 243 215    Recent Labs  03/20/15 1700 03/21/15 1458  NA 137 140  K 4.1 4.7  CL 103 107  CO2 22 23  BUN 37* 46*  CREATININE 1.37* 1.85*  GLUCOSE 153* 114*  CALCIUM 8.3* 7.9*   No results for input(s): LABPT, INR in the last 72 hours.  EXAM General - Patient is Alert, Confused Extremity - Neurovascular intact Sensation intact distally Motor Function - intact, moving foot and toes well on exam. Right leg is shortened and rotated.  Past Medical History  Diagnosis Date  . Osteoarthritis   . Depression   . Hypertension   . Chronic atrial fibrillation ([place])   . Hypothyroidism   . Constipation   . Insomnia   . Hyperlipidemia   . Osteoporosis   . CVA (cerebral infarction) approx 2012    residual defect: poor R  sided peripheral vision per son    Assessment/Plan: Hospital day - 2 Active Problems:   Essential hypertension, benign   Hypothyroidism   Chronic atrial fibrillation ([facility])   Hip fracture ([facility])   UTI (lower urinary tract infection)  Estimated body mass index is 20.42 kg/(m^2) as calculated from the following:   Height as of this encounter: 5\' 4"  (1.626 m).   Weight as of this encounter: 53.978 kg (119 lb). IV restart Blood. Son states that he signe d the consent to proceed with surgery. NPO since 9 AM today.  Arlee Muslim, PA-C Orthopaedic Surgery 03/22/2015, 11:18 AM

## 2015-03-22 NOTE — Progress Notes (Addendum)
[facility] PROGRESS NOTE  [name] [id number] DOB: 03/06/1920 DOA: 03/20/2015 PCP: Tammi Sou, MD  Brief Summary  Marie Robertson is a 82 y.o. female with a past medical history significant for Afib on apixaban, hypothyroidism, dementia, and HTN who presented with hip pain after a fall.  Patient was delirious on arrival but was found on the floor of her facility by staff in the morning.  The sons were aware of no other complaints of chest pain, dizziness, pre-syncope, or micturation preceding the fall, but note that this is her second fall requiring ER visit in the last week.  In the ED, the patient was hemodynamically stable and a plain radiograph of the right hip showed a displaced intertrochanteric fracture. Orthopedics was consulted, however, patient is declining surgery.    Assessment/Plan  Right intertrochanteric hip fracture, -  Appreciate Orthopedics assistance -  Although patient is declining surgery, she currently does not have a capacity to make the decision.  She is intermittently confused and cannot explain the options or the implications of the options of Surgery vs. No surgery.  Discussed the options with the son who would like to pursue surgery. - Continue with when necessary pain and nausea medication - Will need PT/OT consult after surgery - Incentive spirometry - Plan for surgery this afternoon, patient is moderate risk for surgical intervention, does not appear to be in decompensated heart failure, or malignant arrhythmia or uncontrolled heart rate, no further workup needed prior Surgery, already received 2 units PRBC yesterday with hemoglobin 10.2 today.  Normocytic anemia, occult stool negative -  Received 2 units PRBC 3/14 in anticipation for surgery, with good response hemoglobin is 10.2 today  Possible UTI: - f/u Urine culture  - Given ceftriaxone on 3/13 and starting keflex   Acute delirium due to medications, fracture/pain, possible UTI,  hospitalization in setting of dementia with behavioral disturbances -Conservative measures and adequate pain control - With significant agitation today, required when necessary Haldol  Chronic atrial fibrillation: CHADS2Vasc 4. -Hold eliquis -Continue atenolol and statin  Acute on chronic kidney disease stage IV - Baseline creatinine 1.7, creatinine 1.3 on admission, increased to 1.8 yesterday, remained stable with IV fluid  Diet:  Dysphagia 3 Access:  PIV IVF:  yes Proph:  SCDs  Code Status: DNR Family Communication: patient and her son [name] who was at bedside  Disposition Plan:  To SNF pending surgery , PT/OT assessments, likely discharge on Saturday    Consultants:  Orthopedic surgery  Procedures:  CT head  CT cervical spine  Chest x-ray  X-ray pelvis  Antibiotics:  Ceftriaxone 3/131  Keflex 3/14   HPI/Subjective:  Patient is confused. She denies pain, shortness of breath, nausea. When discussing her leg fracture, she states she does not want surgery. I attempted to explain the options were surgery versus no surgery, and if she has surgery she will have a prolonged recovery at a skilled nursing facility. If she does not have surgery she will be bed ridden. She was unable to repeat the 2 options back to me nor the implications of the 2 decisions. She does not have capacity to make this decision.  Objective: Filed Vitals:   03/21/15 2328 03/22/15 0132 03/22/15 0158 03/22/15 0338  BP: 113/64 125/76 112/74 103/65  Pulse: 85 68 77 91  Temp:  98.3 F (36.8 C) 98.9 F (37.2 C)   TempSrc: Oral Oral Oral   Resp: 16 16 16 16   Height:      Weight:  SpO2: 99% 96% 98%     Intake/Output Summary (Last 24 hours) at 03/22/15 1239 Last data filed at 03/22/15 [id number]  Gross per 24 hour  Intake 1937.75 ml  Output      0 ml  Net 1937.75 ml   Filed Weights   03/21/15 1100  Weight: 53.978 kg (119 lb)   Body mass index is 20.42 kg/(m^2).  Exam:   General:   Thin female, No acute distress  HEENT:  NCAT, MMM  Cardiovascular:  IRRR, nl S1, S2 no mrg, 2+ pulses, warm extremities  Respiratory:  CTAB, no increased WOB  Abdomen:   NABS, soft, NT/ND  MSK:   Normal tone and bulk, right lower extremity is foreshortened and externally rotated. She has tenderness to palpation on the lateral hip. 2+ pulses, less than 2 second cap refill. She is able to wiggle her toes and ankle on the right foot. Sensation is intact.  Neuro:  Grossly intact  Data Reviewed: Basic Metabolic Panel:  Recent Labs Lab 03/15/15 1505 03/20/15 1700 03/21/15 1458 03/22/15 1054  NA 141 137 140 138  K 4.4 4.1 4.7 4.2  CL 107 103 107 108  CO2 26 22 23  20*  GLUCOSE 113* 153* 114* 111*  BUN 29* 37* 46* 47*  CREATININE 1.37* 1.37* 1.85* 1.82*  CALCIUM 8.4* 8.3* 7.9* 7.8*   Liver Function Tests:  Recent Labs Lab 03/20/15 1700  AST 27  ALT 15  ALKPHOS 51  BILITOT 0.8  PROT 6.4*  ALBUMIN 3.5   No results for input(s): LIPASE, AMYLASE in the last 168 hours. No results for input(s): AMMONIA in the last 168 hours. CBC:  Recent Labs Lab 03/15/15 1505 03/20/15 1700 03/21/15 1458 03/22/15 1054  WBC 7.0 13.3* 11.2* 12.3*  NEUTROABS 5.0 11.7*  --   --   HGB 10.4* 8.6* 7.8* 10.2*  HCT 32.8* 26.6* 24.2* 30.7*  MCV 91.1 88.4 90.0 87.5  PLT 264 243 215 PLATELET CLUMPS NOTED ON SMEAR, COUNT APPEARS ADEQUATE    Recent Results (from the past 240 hour(s))  Urine culture     Status: None (Preliminary result)   Collection Time: 03/20/15  6:40 PM  Result Value Ref Range Status   Specimen Description URINE, CATHETERIZED  Final   Special Requests NONE  Final   Culture   Final    CULTURE REINCUBATED FOR BETTER GROWTH Performed at [facility]    Report Status PENDING  Incomplete  MRSA PCR Screening     Status: None   Collection Time: 03/21/15  5:14 AM  Result Value Ref Range Status   MRSA by PCR NEGATIVE NEGATIVE Final    Comment:        The GeneXpert  MRSA Assay (FDA approved for NASAL specimens only), is one component of a comprehensive MRSA colonization surveillance program. It is not intended to diagnose MRSA infection nor to guide or monitor treatment for MRSA infections.      Studies: Dg Chest 1 View  03/20/2015  CLINICAL DATA:  Found down today at nursing home. EXAM: CHEST  1 VIEW; DG HIP (WITH OR WITHOUT PELVIS) 2-3V RIGHT COMPARISON:  Chest x-ray 03/15/2015 FINDINGS: One view chest: The cardiac silhouette, mediastinal and hilar contours are within normal limits and stable. There is tortuosity of the thoracic aorta. Stable moderate-sized hiatal hernia. No acute pulmonary findings. The bony thorax is intact. Stable advanced degenerative changes involving both shoulders. Pelvis/right hip: There is a displaced intertrochanteric fracture of the right hip. Moderate varus deformity. The  left hip is intact. The pubic symphysis and SI joints are intact. No definite pelvic fractures. IMPRESSION: 1. Displaced intertrochanteric fracture of the right hip. 2. No acute cardiopulmonary findings. Electronically Signed   By: Marijo Sanes M.D.   On: 03/20/2015 17:57   Ct Head Wo Contrast  03/20/2015  CLINICAL DATA:  [place] today, found lying on floor, denies loss of consciousness, mild confusion EXAM: CT HEAD WITHOUT CONTRAST CT CERVICAL SPINE WITHOUT CONTRAST TECHNIQUE: Multidetector CT imaging of the head and cervical spine was performed following the standard protocol without intravenous contrast. Multiplanar CT image reconstructions of the cervical spine were also generated. COMPARISON:  03/15/2015 FINDINGS: CT HEAD FINDINGS Severe diffuse atrophy. Encephalomalacia left occipital lobe. No evidence of acute infarct or mass. No hemorrhage or extra-axial fluid. No skull fracture. Visualized portions of the paranasal sinuses are clear. CT CERVICAL SPINE FINDINGS Stable grade 1 anterior listhesis of C4 on C5 stable degenerative disc disease throughout the  cervical spine most severe at C5-6 and C6-7. Stable mild compression deformity involving the superior endplate of T1. Stable large bilateral multinodular thyroid goiter. Unchanged measurements from 03/15/2015. IMPRESSION: No change when compared to recent prior study. No acute intracranial abnormalities. There is stable chronic involutional change and left occipital lobe encephalomalacia. Stable mild T1 compression deformity. Electronically Signed   By: Skipper Cliche M.D.   On: 03/20/2015 17:40   Ct Cervical Spine Wo Contrast  03/20/2015  CLINICAL DATA:  [place] today, found lying on floor, denies loss of consciousness, mild confusion EXAM: CT HEAD WITHOUT CONTRAST CT CERVICAL SPINE WITHOUT CONTRAST TECHNIQUE: Multidetector CT imaging of the head and cervical spine was performed following the standard protocol without intravenous contrast. Multiplanar CT image reconstructions of the cervical spine were also generated. COMPARISON:  03/15/2015 FINDINGS: CT HEAD FINDINGS Severe diffuse atrophy. Encephalomalacia left occipital lobe. No evidence of acute infarct or mass. No hemorrhage or extra-axial fluid. No skull fracture. Visualized portions of the paranasal sinuses are clear. CT CERVICAL SPINE FINDINGS Stable grade 1 anterior listhesis of C4 on C5 stable degenerative disc disease throughout the cervical spine most severe at C5-6 and C6-7. Stable mild compression deformity involving the superior endplate of T1. Stable large bilateral multinodular thyroid goiter. Unchanged measurements from 03/15/2015. IMPRESSION: No change when compared to recent prior study. No acute intracranial abnormalities. There is stable chronic involutional change and left occipital lobe encephalomalacia. Stable mild T1 compression deformity. Electronically Signed   By: Skipper Cliche M.D.   On: 03/20/2015 17:40   Dg Hip Unilat With Pelvis 2-3 Views Right  03/20/2015  CLINICAL DATA:  Found down today at nursing home. EXAM: CHEST  1 VIEW;  DG HIP (WITH OR WITHOUT PELVIS) 2-3V RIGHT COMPARISON:  Chest x-ray 03/15/2015 FINDINGS: One view chest: The cardiac silhouette, mediastinal and hilar contours are within normal limits and stable. There is tortuosity of the thoracic aorta. Stable moderate-sized hiatal hernia. No acute pulmonary findings. The bony thorax is intact. Stable advanced degenerative changes involving both shoulders. Pelvis/right hip: There is a displaced intertrochanteric fracture of the right hip. Moderate varus deformity. The left hip is intact. The pubic symphysis and SI joints are intact. No definite pelvic fractures. IMPRESSION: 1. Displaced intertrochanteric fracture of the right hip. 2. No acute cardiopulmonary findings. Electronically Signed   By: Marijo Sanes M.D.   On: 03/20/2015 17:57    Scheduled Meds: . sodium chloride   Intravenous Once  . atenolol  50 mg Oral Daily  . cephALEXin  500 mg Oral  3 times per day  . feeding supplement (ENSURE ENLIVE)  237 mL Oral Q24H  . haloperidol lactate      . levothyroxine  75 mcg Oral Daily  . pravastatin  40 mg Oral QHS   Continuous Infusions: . sodium chloride 75 mL/hr at 03/22/15 1042    Active Problems:   Essential hypertension, benign   Hypothyroidism   Chronic atrial fibrillation (HCC)   Hip fracture (HCC)   UTI (lower urinary tract infection)    Time spent: 30 min    Zavion Sleight  [facility] Pager [phone]. If 7PM-7AM, please contact night-coverage at [email or web address], password [facility] 03/22/2015, 12:39 PM  LOS: 2 days

## 2015-03-22 NOTE — Transfer of Care (Signed)
Immediate Anesthesia Transfer of Care Note  Patient: Marie Robertson  Procedure(s) Performed: Procedure(s): RIGHT femoral HIP NAILING (Right)  Patient Location: PACU  Anesthesia Type:General  Level of Consciousness:  sedated, patient cooperative and responds to stimulation  Airway & Oxygen Therapy:Patient Spontanous Breathing and Patient connected to face mask oxgen  Post-op Assessment:  Report given to PACU RN and Post -op Vital signs reviewed and stable  Post vital signs:  Reviewed and stable  Last Vitals:  Filed Vitals:   03/22/15 0338 03/22/15 1640  BP: 103/65 156/74  Pulse: 91 104  Temp:  36.9 C  Resp: 16 18    Complications: No apparent anesthesia complications

## 2015-03-22 NOTE — Progress Notes (Signed)
Pt is very confused, agitated & combative to staff this am, pulled IV & trying to pull her Foley cath. Haldol 1mg  IM given. Will cont to monitor. Placed a call to her son [name], he will be coming.

## 2015-03-22 NOTE — Clinical Social Work Placement (Signed)
   CLINICAL SOCIAL WORK PLACEMENT  NOTE  Date:  03/22/2015  Patient Details  Name: Marie Robertson MRN: [id number] Date of Birth: 05/29/1920  Clinical Social Work is seeking post-discharge placement for this patient at the [place] level of care (*CSW will initial, date and re-position this form in  chart as items are completed):  Yes   Patient/family provided with [place] Work Department's list of facilities offering this level of care within the geographic area requested by the patient (or if unable, by the patient's family).  Yes   Patient/family informed of their freedom to choose among providers that offer the needed level of care, that participate in Medicare, Medicaid or managed care program needed by the patient, have an available bed and are willing to accept the patient.  Yes   Patient/family informed of [facility]'s ownership interest in [facility] and [facility], as well as of the fact that they are under no obligation to receive care at these facilities.  PASRR submitted to EDS on 03/22/15     PASRR number received on 03/22/15     Existing PASRR number confirmed on       FL2 transmitted to all facilities in geographic area requested by pt/family on 03/22/15     FL2 transmitted to all facilities within larger geographic area on       Patient informed that his/her managed care company has contracts with or will negotiate with certain facilities, including the following:            Patient/family informed of bed offers received.  Patient chooses bed at       Physician recommends and patient chooses bed at      Patient to be transferred to   on  .  Patient to be transferred to facility by       Patient family notified on   of transfer.  Name of family member notified:        PHYSICIAN       Additional Comment:    _______________________________________________ Standley Brooking, LCSW 03/22/2015, 11:54 AM

## 2015-03-22 NOTE — Clinical Documentation Improvement (Signed)
Internal Medicine Orthopedic  Based on the clinical findings below, please document any associated diagnoses/conditions the patient has or may have.   Dementia with behavioral disturbances  Other  Clinically Undetermined  Supporting Information: -- Chart documents "delirium", "confusion", "paranoid", "yelling and swiping at nurses", "aggressive behavior"  Please exercise your independent, professional judgment when responding. A specific answer is not anticipated or expected.  Thank You, Ezekiel Ina RN [facility] [phone]

## 2015-03-23 ENCOUNTER — Encounter (HOSPITAL_COMMUNITY): Payer: Self-pay | Admitting: Orthopedic Surgery

## 2015-03-23 LAB — CBC
HEMATOCRIT: 29.1 % — AB (ref 36.0–46.0)
Hemoglobin: 9.5 g/dL — ABNORMAL LOW (ref 12.0–15.0)
MCH: 29.2 pg (ref 26.0–34.0)
MCHC: 32.6 g/dL (ref 30.0–36.0)
MCV: 89.5 fL (ref 78.0–100.0)
PLATELETS: 188 10*3/uL (ref 150–400)
RBC: 3.25 MIL/uL — AB (ref 3.87–5.11)
RDW: 15.9 % — ABNORMAL HIGH (ref 11.5–15.5)
WBC: 8.1 10*3/uL (ref 4.0–10.5)

## 2015-03-23 LAB — BASIC METABOLIC PANEL
Anion gap: 10 (ref 5–15)
BUN: 37 mg/dL — ABNORMAL HIGH (ref 6–20)
CHLORIDE: 107 mmol/L (ref 101–111)
CO2: 20 mmol/L — ABNORMAL LOW (ref 22–32)
CREATININE: 1.45 mg/dL — AB (ref 0.44–1.00)
Calcium: 7.7 mg/dL — ABNORMAL LOW (ref 8.9–10.3)
GFR calc non Af Amer: 30 mL/min — ABNORMAL LOW (ref >60)
GFR, EST AFRICAN AMERICAN: 35 mL/min — AB (ref >60)
Glucose, Bld: 150 mg/dL — ABNORMAL HIGH (ref 65–99)
POTASSIUM: 4.2 mmol/L (ref 3.5–5.1)
SODIUM: 137 mmol/L (ref 135–145)

## 2015-03-23 LAB — TYPE AND SCREEN
ABO/RH(D): O POS
Antibody Screen: NEGATIVE
UNIT DIVISION: 0
UNIT DIVISION: 0

## 2015-03-23 NOTE — Clinical Social Work Placement (Signed)
CSW provided patient's son, [name] with SNF bed offers - son has accepted bed at [facility]. CSW confirmed with Lexine Baton at [facility] that they would be able to take patient when ready - anticipating possible discharge tomorrow, Fri 3/17.  Raynaldo Opitz, [facility] Clinical Social Worker cell #: [phone]       CLINICAL SOCIAL WORK PLACEMENT  NOTE  Date:  03/23/2015  Patient Details  Name: Marie Robertson MRN: [id number] Date of Birth: Jan 25, 1920  Clinical Social Work is seeking post-discharge placement for this patient at the [place] level of care (*CSW will initial, date and re-position this form in  chart as items are completed):  Yes   Patient/family provided with [place] Work Department's list of facilities offering this level of care within the geographic area requested by the patient (or if unable, by the patient's family).  Yes   Patient/family informed of their freedom to choose among providers that offer the needed level of care, that participate in Medicare, Medicaid or managed care program needed by the patient, have an available bed and are willing to accept the patient.  Yes   Patient/family informed of [facility]'s ownership interest in [facility] and [facility], as well as of the fact that they are under no obligation to receive care at these facilities.  PASRR submitted to EDS on 03/22/15     PASRR number received on 03/22/15     Existing PASRR number confirmed on       FL2 transmitted to all facilities in geographic area requested by pt/family on 03/22/15     FL2 transmitted to all facilities within larger geographic area on       Patient informed that his/her managed care company has contracts with or will negotiate with certain facilities, including the following:        Yes   Patient/family informed of bed offers received.  Patient chooses bed at [facility] and [place] recommends and patient chooses bed at      Patient to be transferred to [facility] on  .  Patient to be transferred to facility by       Patient family notified on   of transfer.  Name of family member notified:        PHYSICIAN       Additional Comment:    _______________________________________________ Standley Brooking, LCSW 03/23/2015, 1:35 PM

## 2015-03-23 NOTE — Evaluation (Signed)
Physical Therapy Evaluation Patient Details Name: Marie Robertson MRN: [id number] DOB: April 01, 1920 Today's Date: 03/23/2015   History of Present Illness  82 y.o. female with a past medical history significant for Afib on apixaban, hypothyroidism, dementia, CVA, and HTN who presented with hip pain after a fall and s/p Intramedullary nailing of right intertrochanteric femur.  Clinical Impression  Pt admitted with above diagnosis. Pt currently with functional limitations due to the deficits listed below (see PT Problem List).  Pt will benefit from skilled PT to increase their independence and safety with mobility to allow discharge to the venue listed below.   Pt assisted OOB and initiated ambulation however only able to tolerate a couple steps due to pain so recliner positioned behind pt.  Pt would benefit from ST-SNF upon d/c.     Follow Up Recommendations SNF;Supervision/Assistance - 24 hour    Equipment Recommendations  None recommended by PT    Recommendations for Other Services       Precautions / Restrictions Precautions Precautions: Fall Restrictions RLE Weight Bearing: Weight bearing as tolerated      Mobility  Bed Mobility Overal bed mobility: Needs Assistance;+2 for physical assistance Bed Mobility: Supine to Sit     Supine to sit: Mod assist;HOB elevated     General bed mobility comments: verbal cues for self assist, assist provided for LEs, pt able to assist upper body by pulling up with hand assist  Transfers Overall transfer level: Needs assistance Equipment used: Rolling walker (2 wheeled) Transfers: Sit to/from Stand Sit to Stand: Mod assist;+2 physical assistance         General transfer comment: verbal cues for technique, pt confused so multimodal cues provided  Ambulation/Gait Ambulation/Gait assistance: +2 physical assistance;Mod assist Ambulation Distance (Feet): 4 Feet Assistive device: Rolling walker (2 wheeled) Gait Pattern/deviations:  Step-to pattern;Decreased weight shift to right;Decreased stance time - right;Antalgic     General Gait Details: multimodal cues for sequence and RW positioning, pt reporting increased pain so recliner brought behind pt  Stairs            Wheelchair Mobility    Modified Rankin (Stroke Patients Only)       Balance Overall balance assessment: History of Falls                                           Pertinent Vitals/Pain Pain Assessment: Faces Faces Pain Scale: Hurts even more Pain Location: R hip Pain Descriptors / Indicators: Grimacing;Guarding Pain Intervention(s): Monitored during session;Limited activity within patient's tolerance;Premedicated before session;Repositioned;Ice applied    Home Living Family/patient expects to be discharged to:: Skilled nursing facility                      Prior Function Level of Independence: Independent with assistive device(s)               Hand Dominance        Extremity/Trunk Assessment   Upper Extremity Assessment: Generalized weakness           Lower Extremity Assessment: RLE deficits/detail;Generalized weakness RLE Deficits / Details: assist required for mobility       Communication   Communication: No difficulties  Cognition Arousal/Alertness: Awake/alert Behavior During Therapy: WFL for tasks assessed/performed Overall Cognitive Status: Impaired/Different from baseline Area of Impairment: Following commands;Safety/judgement       Following Commands: Follows one step commands  inconsistently Safety/Judgement: Decreased awareness of safety;Decreased awareness of deficits     General Comments: pt's niece in room and reports intermittent confusion since her arrival which is not pt's baseline    General Comments      Exercises        Assessment/Plan    PT Assessment Patient needs continued PT services  PT Diagnosis Difficulty walking;Acute pain   PT Problem List  Decreased strength;Decreased activity tolerance;Decreased mobility;Pain;Decreased knowledge of use of DME;Decreased cognition  PT Treatment Interventions Stair training;DME instruction;Therapeutic activities;Therapeutic exercise;Functional mobility training;Patient/family education;Balance training   PT Goals (Current goals can be found in the Care Plan section) Acute Rehab PT Goals PT Goal Formulation: With patient Time For Goal Achievement: 03/30/15 Potential to Achieve Goals: Good    Frequency 7X/week   Barriers to discharge        Co-evaluation               End of Session Equipment Utilized During Treatment: Gait belt Activity Tolerance: Patient limited by pain;Patient limited by fatigue Patient left: in chair;with call bell/phone within reach;with chair alarm set Nurse Communication: Mobility status         Time: [id number] PT Time Calculation (min) (ACUTE ONLY): 18 min   Charges:   PT Evaluation $PT Eval Moderate Complexity: 1 Procedure     PT G Codes:        [name],[name] 03/23/2015, 3:46 PM [name], PT, DPT 03/23/2015 Pager: [phone]

## 2015-03-23 NOTE — Progress Notes (Signed)
[facility] PROGRESS NOTE  Leyre Schlanger [id number] DOB: 12-09-1920 DOA: 03/20/2015 PCP: Tammi Sou, MD  Brief Summary  Marie Robertson is a 82 y.o. female with a past medical history significant for Afib on apixaban, hypothyroidism, dementia, and HTN who presented with hip pain after a fall.  Patient was delirious on arrival but was found on the floor of her facility by staff in the morning.  The sons were aware of no other complaints of chest pain, dizziness, pre-syncope, or micturation preceding the fall, but note that this is her second fall requiring ER visit in the last week.  In the ED, the patient was hemodynamically stable and a plain radiograph of the right hip showed a displaced intertrochanteric fracture. Orthopedics was consulted, patient went for surgical repair on 3/15. Assessment/Plan  Right intertrochanteric hip fracture, -  Appreciate Orthopedics assistance -  Although patient is declining surgery, she currently does not have a capacity to make the decision.  She is intermittently confused and cannot explain the options or the implications of the options of Surgery vs. No surgery.  Discussed the options with the son who would like to pursue surgery. - Continue with when necessary pain and nausea medication - Incentive spirometry - Status post surgical repair 3/15 - Plan to discharge to SNF  Normocytic anemia, occult stool negative -  Received 2 units PRBC 3/14 in anticipation for surgery.  Possible UTI: - f/u Urine culture growing Proteus mirabilis, awaiting sensitivity - Given ceftriaxone on 3/13 and currently on Keflex keflex   Acute delirium due to medications, fracture/pain, possible UTI, hospitalization in setting of dementia with behavioral disturbances -Conservative measures and adequate pain control - With significant agitation today, required when necessary Haldol  Chronic atrial fibrillation: CHADS2Vasc 4. -Resumed Eliquis -Continue  atenolol and statin  Acute on chronic kidney disease stage IV - Baseline creatinine 1.7, creatinine 1.3 on admission, increased to 1.8 yesterday, remained stable with IV fluid  Diet:  Dysphagia 3 Access:  PIV IVF:  yes Proph:  SCDs  Code Status: DNR Family Communication: patient and her son [name] who was at bedside  Disposition Plan:  To SNF in 1-2 days   Consultants:  Orthopedic surgery  Procedures:  CT head  CT cervical spine  Chest x-ray  X-ray pelvis  Antibiotics:  Ceftriaxone 3/131  Keflex 3/14   HPI/Subjective:  Patient is confused. She denies pain, shortness of breath, nausea. Objective: Filed Vitals:   03/22/15 1951 03/22/15 2346 03/23/15 0421 03/23/15 1327  BP: 150/81 156/93 149/87 120/75  Pulse: 84 78 86 107  Temp: 97.7 F (36.5 C) 97.9 F (36.6 C) 97.7 F (36.5 C) 98.6 F (37 C)  TempSrc:  Oral Oral Oral  Resp: 18   20  Height:      Weight:      SpO2: 94% 96% 98% 97%    Intake/Output Summary (Last 24 hours) at 03/23/15 1459 Last data filed at 03/23/15 1329  Gross per 24 hour  Intake 2701.25 ml  Output   1900 ml  Net 801.25 ml   Filed Weights   03/21/15 1100  Weight: 53.978 kg (119 lb)   Body mass index is 20.42 kg/(m^2).  Exam:   General:  Thin female, No acute distress  HEENT:  NCAT, MMM  Cardiovascular:  IRRR, nl S1, S2 no mrg, 2+ pulses, warm extremities  Respiratory:  CTAB, no increased WOB  Abdomen:   NABS, soft, NT/ND  Neuro:  Grossly intact  Data Reviewed: Basic Metabolic Panel:  Recent  Labs Lab 03/20/15 1700 03/21/15 1458 03/22/15 1054 03/23/15 0439  NA 137 140 138 137  K 4.1 4.7 4.2 4.2  CL 103 107 108 107  CO2 22 23 20* 20*  GLUCOSE 153* 114* 111* 150*  BUN 37* 46* 47* 37*  CREATININE 1.37* 1.85* 1.82* 1.45*  CALCIUM 8.3* 7.9* 7.8* 7.7*   Liver Function Tests:  Recent Labs Lab 03/20/15 1700  AST 27  ALT 15  ALKPHOS 51  BILITOT 0.8  PROT 6.4*  ALBUMIN 3.5   No results for input(s):  LIPASE, AMYLASE in the last 168 hours. No results for input(s): AMMONIA in the last 168 hours. CBC:  Recent Labs Lab 03/20/15 1700 03/21/15 1458 03/22/15 1054 03/23/15 0439  WBC 13.3* 11.2* 12.3* 8.1  NEUTROABS 11.7*  --   --   --   HGB 8.6* 7.8* 10.2* 9.5*  HCT 26.6* 24.2* 30.7* 29.1*  MCV 88.4 90.0 87.5 89.5  PLT 243 215 PLATELET CLUMPS NOTED ON SMEAR, COUNT APPEARS ADEQUATE 188    Recent Results (from the past 240 hour(s))  Urine culture     Status: None (Preliminary result)   Collection Time: 03/20/15  6:40 PM  Result Value Ref Range Status   Specimen Description URINE, CATHETERIZED  Final   Special Requests NONE  Final   Culture   Final    >=100,000 COLONIES/mL PROTEUS MIRABILIS Performed at [facility]    Report Status PENDING  Incomplete  MRSA PCR Screening     Status: None   Collection Time: 03/21/15  5:14 AM  Result Value Ref Range Status   MRSA by PCR NEGATIVE NEGATIVE Final    Comment:        The GeneXpert MRSA Assay (FDA approved for NASAL specimens only), is one component of a comprehensive MRSA colonization surveillance program. It is not intended to diagnose MRSA infection nor to guide or monitor treatment for MRSA infections.      Studies: Dg C-arm 1-60 Min-no Report  03/22/2015  CLINICAL DATA: hip C-ARM 1-60 MINUTES Fluoroscopy was utilized by the requesting physician.  No radiographic interpretation.   Dg Hip Operative Unilat W Or W/o Pelvis Right  03/22/2015  CLINICAL DATA:  Intratrochanteric fracture EXAM: OPERATIVE RIGHT HIP WITH PELVIS COMPARISON:  None. FLUOROSCOPY TIME:  Radiation Exposure Index (as provided by the fluoroscopic device): 8.4 mGy If the device does not provide the exposure index: Fluoroscopy Time:  32 seconds Number of Acquired Images:  3 FINDINGS: A short medullary rod and fixation screw are noted traversing the proximal femur. Fracture fragments are in near anatomic alignment with the exception of the lesser  trochanter fragment. It is mildly displaced. No other focal abnormality is seen. IMPRESSION: Status post ORIF of proximal right femoral fracture. Electronically Signed   By: Inez Catalina M.D.   On: 03/22/2015 18:43    Scheduled Meds: . apixaban  2.5 mg Oral BID  . atenolol  50 mg Oral Daily  . cephALEXin  500 mg Oral 3 times per day  . feeding supplement (ENSURE ENLIVE)  237 mL Oral Q24H  . levothyroxine  75 mcg Oral Daily  . pravastatin  40 mg Oral QHS   Continuous Infusions: . sodium chloride 75 mL/hr at 03/23/15 [id number]    Active Problems:   Essential hypertension, benign   Hypothyroidism   Chronic atrial fibrillation (HCC)   Hip fracture (HCC)   UTI (lower urinary tract infection)    Time spent: 25 min    ELGERGAWY, DAWOOD  [facility] Pager [phone]. If 7PM-7AM, please contact night-coverage at [email or web address], password [facility] 03/23/2015, 2:59 PM  LOS: 3 days

## 2015-03-23 NOTE — Progress Notes (Signed)
OT Cancellation Note  Patient Details Name: Marie Robertson MRN: [id number] DOB: [birth year]   Cancelled Treatment:    Reason Eval/Treat Not Completed: Other (comment).  Noted plan is for pt to go to SNF for rehab, likely tomorrow.  Will defer OT to next venue.  Adien Kimmel 03/23/2015, 2:53 PM  Lesle Chris, OTR/L [phone] 03/23/2015

## 2015-03-23 NOTE — Progress Notes (Signed)
Night shift RN reported that patient was refusing to have foley catheter removed per MD order to remove on POD #1.  I attempted to remove catheter but patient is still refusing at this time.  She stated she "wanted to talk to the doctor first."  I educated the patient on why we remove catheters and risk for infection.  Patient stated she understood but did not want it removed at this time. Will try again later and report to MD during rounding.  Will continue to monitor.

## 2015-03-23 NOTE — Progress Notes (Signed)
Attempted to remove foley catheter per MD order to remove POD1. Pt became agitated and stated "I want to talk to the doctor first." Will pass on to next shift.

## 2015-03-24 DIAGNOSIS — E038 Other specified hypothyroidism: Secondary | ICD-10-CM | POA: Diagnosis not present

## 2015-03-24 DIAGNOSIS — I10 Essential (primary) hypertension: Secondary | ICD-10-CM | POA: Diagnosis not present

## 2015-03-24 DIAGNOSIS — D62 Acute posthemorrhagic anemia: Secondary | ICD-10-CM | POA: Diagnosis not present

## 2015-03-24 DIAGNOSIS — E559 Vitamin D deficiency, unspecified: Secondary | ICD-10-CM | POA: Diagnosis not present

## 2015-03-24 DIAGNOSIS — S72141D Displaced intertrochanteric fracture of right femur, subsequent encounter for closed fracture with routine healing: Secondary | ICD-10-CM | POA: Diagnosis not present

## 2015-03-24 DIAGNOSIS — E039 Hypothyroidism, unspecified: Secondary | ICD-10-CM | POA: Diagnosis not present

## 2015-03-24 DIAGNOSIS — N289 Disorder of kidney and ureter, unspecified: Secondary | ICD-10-CM | POA: Diagnosis not present

## 2015-03-24 DIAGNOSIS — S72009A Fracture of unspecified part of neck of unspecified femur, initial encounter for closed fracture: Secondary | ICD-10-CM | POA: Diagnosis not present

## 2015-03-24 DIAGNOSIS — S72001A Fracture of unspecified part of neck of right femur, initial encounter for closed fracture: Secondary | ICD-10-CM | POA: Diagnosis not present

## 2015-03-24 DIAGNOSIS — S72001D Fracture of unspecified part of neck of right femur, subsequent encounter for closed fracture with routine healing: Secondary | ICD-10-CM | POA: Diagnosis not present

## 2015-03-24 DIAGNOSIS — N184 Chronic kidney disease, stage 4 (severe): Secondary | ICD-10-CM | POA: Diagnosis not present

## 2015-03-24 DIAGNOSIS — N390 Urinary tract infection, site not specified: Secondary | ICD-10-CM | POA: Diagnosis not present

## 2015-03-24 DIAGNOSIS — E785 Hyperlipidemia, unspecified: Secondary | ICD-10-CM | POA: Diagnosis not present

## 2015-03-24 DIAGNOSIS — I482 Chronic atrial fibrillation: Secondary | ICD-10-CM | POA: Diagnosis not present

## 2015-03-24 DIAGNOSIS — Z9181 History of falling: Secondary | ICD-10-CM | POA: Diagnosis not present

## 2015-03-24 DIAGNOSIS — R300 Dysuria: Secondary | ICD-10-CM | POA: Diagnosis not present

## 2015-03-24 DIAGNOSIS — G9340 Encephalopathy, unspecified: Secondary | ICD-10-CM | POA: Diagnosis not present

## 2015-03-24 DIAGNOSIS — R488 Other symbolic dysfunctions: Secondary | ICD-10-CM | POA: Diagnosis not present

## 2015-03-24 DIAGNOSIS — I129 Hypertensive chronic kidney disease with stage 1 through stage 4 chronic kidney disease, or unspecified chronic kidney disease: Secondary | ICD-10-CM | POA: Diagnosis not present

## 2015-03-24 DIAGNOSIS — M6281 Muscle weakness (generalized): Secondary | ICD-10-CM | POA: Diagnosis not present

## 2015-03-24 DIAGNOSIS — R2689 Other abnormalities of gait and mobility: Secondary | ICD-10-CM | POA: Diagnosis not present

## 2015-03-24 DIAGNOSIS — B964 Proteus (mirabilis) (morganii) as the cause of diseases classified elsewhere: Secondary | ICD-10-CM | POA: Diagnosis not present

## 2015-03-24 DIAGNOSIS — D649 Anemia, unspecified: Secondary | ICD-10-CM | POA: Diagnosis not present

## 2015-03-24 DIAGNOSIS — E034 Atrophy of thyroid (acquired): Secondary | ICD-10-CM | POA: Diagnosis not present

## 2015-03-24 DIAGNOSIS — R609 Edema, unspecified: Secondary | ICD-10-CM | POA: Diagnosis not present

## 2015-03-24 MED ORDER — ENSURE ENLIVE PO LIQD: 237.0000 mL | ORAL | Status: DC

## 2015-03-24 MED ORDER — SENNOSIDES-DOCUSATE SODIUM 8.6-50 MG PO TABS: 1.0000 | ORAL_TABLET | Freq: Every evening | ORAL | Status: DC | PRN

## 2015-03-24 MED ORDER — TRAMADOL HCL 50 MG PO TABS: 50.0000 mg | ORAL_TABLET | Freq: Four times a day (QID) | ORAL | Status: DC | PRN

## 2015-03-24 MED ORDER — ACETAMINOPHEN 325 MG PO TABS: 650.0000 mg | ORAL_TABLET | Freq: Four times a day (QID) | ORAL | Status: DC | PRN

## 2015-03-24 MED ORDER — HYDROCODONE-ACETAMINOPHEN 5-325 MG PO TABS: 1.0000 | ORAL_TABLET | Freq: Four times a day (QID) | ORAL | Status: DC | PRN

## 2015-03-24 NOTE — Progress Notes (Signed)
   Subjective: 2 Days Post-Op Procedure(s) (LRB): RIGHT femoral HIP NAILING (Right) Patient reports some soreness but otherwise no complaints when questioned. Patient seen in rounds for Dr. Wynelle Link.   Patient is well, and has had no acute complaints or problems Patient is ready to go to the SNF when she is medically stable.  May transfer at any point from an ortho standpoint.  Objective: Vital signs in last 24 hours: Temp:  [97.9 F (36.6 C)-98.7 F (37.1 C)] 97.9 F (36.6 C) (03/17 0533) Pulse Rate:  [76-107] 76 (03/17 0533) Resp:  [18-20] 18 (03/17 0533) BP: (120-156)/(75-91) 156/82 mmHg (03/17 0533) SpO2:  [97 %-99 %] 98 % (03/17 0533)  Intake/Output from previous day:  Intake/Output Summary (Last 24 hours) at 03/24/15 0848 Last data filed at 03/23/15 2300  Gross per 24 hour  Intake   1680 ml  Output    600 ml  Net   1080 ml    Labs:  Recent Labs  03/21/15 1458 03/22/15 1054 03/23/15 0439  HGB 7.8* 10.2* 9.5*    Recent Labs  03/22/15 1054 03/23/15 0439  WBC 12.3* 8.1  RBC 3.51* 3.25*  HCT 30.7* 29.1*  PLT PLATELET CLUMPS NOTED ON SMEAR, COUNT APPEARS ADEQUATE 188    Recent Labs  03/22/15 1054 03/23/15 0439  NA 138 137  K 4.2 4.2  CL 108 107  CO2 20* 20*  BUN 47* 37*  CREATININE 1.82* 1.45*  GLUCOSE 111* 150*  CALCIUM 7.8* 7.7*   No results for input(s): LABPT, INR in the last 72 hours.  EXAM: General - Patient is Alert and Appropriate with simple questions  Extremity - Neurovascular intact Sensation intact distally Incisions - clean, dry, no drainage Motor Function - intact, moving foot and toes well on exam.   Assessment/Plan: 2 Days Post-Op Procedure(s) (LRB): RIGHT femoral HIP NAILING (Right) Procedure(s) (LRB): RIGHT femoral HIP NAILING (Right) Past Medical History  Diagnosis Date  . Osteoarthritis   . Depression   . Hypertension   . Chronic atrial fibrillation ([place])   . Hypothyroidism   . Constipation   . Insomnia   .  Hyperlipidemia   . Osteoporosis   . CVA (cerebral infarction) approx 2012    residual defect: poor R sided peripheral vision per son   Active Problems:   Essential hypertension, benign   Hypothyroidism   Chronic atrial fibrillation (HCC)   Hip fracture (HCC)   UTI (lower urinary tract infection)  Estimated body mass index is 20.42 kg/(m^2) as calculated from the following:   Height as of this encounter: 5\' 4"  (1.626 m).   Weight as of this encounter: 53.978 kg (119 lb). Up with therapy Discharge to SNF when medically stable Follow up - in 2 weeks Activity - WBAT to the right leg Disposition - Skilled nursing facility D/C Meds - See DC Summary, Ortho RXs will be placed on the hard chart at the nurses station. DVT Prophylaxis - Eliquis.  She was on Eliquis preop and was placed back onto this med postop.  Arlee Muslim, PA-C Orthopaedic Surgery 03/24/2015, 8:48 AM

## 2015-03-24 NOTE — Discharge Summary (Addendum)
Marie Robertson, is a 82 y.o. female  DOB Feb 24, 1920  MRN [id number].  Admission date:  03/20/2015  Admitting Physician  Edwin Dada, MD  Discharge Date:  03/24/2015   Primary MD  Tammi Sou, MD  Recommendations for primary care physician for things to follow:  - Patient to follow with orthopedic on discharge as scheduled appointment,  please see below. - Please check CBC, BMP in 3 days  CODE STATUS: DO NOT RESUSCITATE, for by her son [name]) at bedside Admission Diagnosis  UTI (lower urinary tract infection) [N39.0] Fall [W19.XXXA] Hip fracture, right, closed, initial encounter ([place]) [S72.001A] Syncope, unspecified syncope type [R55]   Discharge Diagnosis  UTI (lower urinary tract infection) [N39.0] Fall [W19.XXXA] Hip fracture, right, closed, initial encounter ([place]) [S72.001A] Syncope, unspecified syncope type [R55]    Active Problems:   Essential hypertension, benign   Hypothyroidism   Chronic atrial fibrillation (HCC)   Hip fracture (HCC)   UTI (lower urinary tract infection)      Past Medical History  Diagnosis Date  . Osteoarthritis   . Depression   . Hypertension   . Chronic atrial fibrillation ([place])   . Hypothyroidism   . Constipation   . Insomnia   . Hyperlipidemia   . Osteoporosis   . CVA (cerebral infarction) approx 2012    residual defect: poor R sided peripheral vision per son    Past Surgical History  Procedure Laterality Date  . Appendectomy    . Knee arthroscopy Right   . Thyroid surgery    . Colonoscopy  08/22/08  . Intramedullary (im) nail intertrochanteric Right 03/22/2015    Procedure: RIGHT femoral HIP NAILING;  Surgeon: Gaynelle Arabian, MD;  Location: [facility];  Service: Orthopedics;  Laterality: Right;       History of present illness and  Hospital Course:     Kindly see H&P for history of present illness and admission details,  please review complete Labs, Consult reports and Test reports for all details in brief  HPI  from the history and physical done on the day of admission 03/20/2015 HPI: Marie Robertson is a 82 y.o. female with a past medical history significant for Afib on apixaban, hypothyroidism, dementia, and HTN who presents with hip pain after a fall.  All history is collected from EDP and the patient's sons, as the patient is delirious on arrival to [facility]. They report that the patient was in her normal state of health until today was found by staff at her facility on the floor. Staff reported that they had seen her early in the morning her normal self, but that around noon she was found on the floor. The patient maintained in the ER that she had fallen overnight, however the patient was beginning to be more delirious while in the ER. The sons were aware of no other complaints of chest pain, dizziness, pre-syncope, or micturation preceding the fall, but note that this is her second fall requiring ER visit in the last week.  In the ED,  the patient was hemodynamically stable and a plain radiograph of the right hip showed a displaced intertrochanteric fracture. The case was discussed with Dr. Ronnie Derby who agreed to see the patient, and [facility] were asked to admit for medical management.    Hospital Course   Marie Robertson is a 82 y.o. female with a past medical history significant for Afib on apixaban, hypothyroidism, dementia, and HTN who presented with hip pain after a fall. Patient was delirious on arrival but was found on the floor of her facility by staff in the morning. The sons were aware of no other complaints of chest pain, dizziness, pre-syncope, or micturation preceding the fall, but note that this is her second fall requiring ER visit in the last week. In the ED, the patient was hemodynamically stable and a plain radiograph of the right hip showed a displaced intertrochanteric fracture. Orthopedics was  consulted, patient went for surgical repair on 3/15.  Right intertrochanteric hip fracture, - Status post surgical repair 3/15, please see orthopedic discharge recommendation, for discharge to SNF.  Normocytic anemia, occult stool negative - Received 2 units PRBC 3/14 in anticipation for surgery, will remain remained stable since, glycohemoglobin 7.8 on admission, was 9.5 yesterday.  UTI: - culture growing Proteus mirabilis, sensitivities was pending at time of discharge - Given ceftriaxone on 3/13 and treated with by mouth Keflex during hospital stay, finish total of 3 days, no indication for further antibiotic on discharge.  Hypertension - Continue with home medication  Hypothyroidism - Continue with Synthroid  Acute delirium due to medications, fracture/pain, possible UTI, hospitalization in setting of dementia with behavioral disturbances - Patient significantly improved, currently back at baseline  Chronic atrial fibrillation: CHADS2Vasc 4. -Resumed Eliquis -Continue atenolol and statin  Acute on chronic kidney disease stage IV - Baseline creatinine 1.7, creatinine 1.3 on admission, peaked at 1.8, improved with IV fluid, most recent creatinine is 1.45.   Discharge Condition: stable   Follow UP  Follow-up Information    Follow up with Gearlean Alf, MD. Schedule an appointment as soon as possible for a visit on 04/07/2015.   Specialty:  Orthopedic Surgery   Why:  Please call office ASAP at [phone] to setup appointment for patient on Friday 04/07/2015 with Dr. Wynelle Link.   Contact information:   [address] [place] [address] [phone]       Schedule an appointment as soon as possible for a visit with Tammi Sou, MD.   Specialty:  Family Medicine   Contact information:   [address] [place] [address] [phone]         Discharge Instructions  and  Discharge Medications     Discharge Instructions    Call  MD / Call 911    Complete by:  As directed   If you experience chest pain or shortness of breath, CALL 911 and be transported to the hospital emergency room.  If you develope a fever above 101 F, pus (white drainage) or increased drainage or redness at the wound, or calf pain, call your surgeon's office.     Change dressing    Complete by:  As directed   You may change your dressing dressing daily with sterile 4 x 4 inch gauze dressing and paper tape.  Do not submerge the incision under water.     Constipation Prevention    Complete by:  As directed   Drink plenty of fluids.  Prune juice may be helpful.  You  may use a stool softener, such as Colace (over the counter) 100 mg twice a day.  Use MiraLax (over the counter) for constipation as needed.     Diet - low sodium Robertson healthy    Complete by:  As directed      Diet - low sodium Robertson healthy    Complete by:  As directed      Discharge instructions    Complete by:  As directed   Please use stool softner and laxative while on pain medications. Do not submerge incision under water. Please use good hand washing techniques while changing dressing each day. May shower starting three days after surgery. Please use a clean towel to pat the incision dry following showers. Continue to use ice for pain and swelling after surgery. Do not use any lotions or creams on the incision until instructed by your surgeon.  Resumed Eliquis which the patient was taking prior to hip fracture and surgery.  Postoperative Constipation Protocol  Constipation - defined medically as fewer than three stools per week and severe constipation as less than one stool per week.  One of the most common issues patients have following surgery is constipation.  Even if you have a regular bowel pattern at home, your normal regimen is likely to be disrupted due to multiple reasons following surgery.  Combination of anesthesia, postoperative narcotics, change in appetite and  fluid intake all can affect your bowels.  In order to avoid complications following surgery, here are some recommendations in order to help you during your recovery period.  Colace (docusate) - Pick up an over-the-counter form of Colace or another stool softener and take twice a day as long as you are requiring postoperative pain medications.  Take with a full glass of water daily.  If you experience loose stools or diarrhea, hold the colace until you stool forms back up.  If your symptoms do not get better within 1 week or if they get worse, check with your doctor.  Dulcolax (bisacodyl) - Pick up over-the-counter and take as directed by the product packaging as needed to assist with the movement of your bowels.  Take with a full glass of water.  Use this product as needed if not relieved by Colace only.   MiraLax (polyethylene glycol) - Pick up over-the-counter to have on hand.  MiraLax is a solution that will increase the amount of water in your bowels to assist with bowel movements.  Take as directed and can mix with a glass of water, juice, soda, coffee, or tea.  Take if you go more than two days without a movement. Do not use MiraLax more than once per day. Call your doctor if you are still constipated or irregular after using this medication for 7 days in a row.  If you continue to have problems with postoperative constipation, please contact the office for further assistance and recommendations.  If you experience "the worst abdominal pain ever" or develop nausea or vomiting, please contact the office immediatly for further recommendations for treatment.     Discharge instructions    Complete by:  As directed   Follow with Primary MD Tammi Sou, MD after discharge from SNF   Get CBC, CMP,checked  by Primary MD next visit.    Disposition SNF   Diet: Robertson Healthy , with feeding assistance and aspiration precautions.  For Robertson failure patients - Check your Weight same time everyday,  if you gain over 2 pounds, or you develop in  leg swelling, experience more shortness of breath or chest pain, call your Primary MD immediately. Follow Cardiac Low Salt Diet and 1.5 lit/day fluid restriction.   On your next visit with your primary care physician please Get Medicines reviewed and adjusted.   Please request your Prim.MD to go over all Hospital Tests and Procedure/Radiological results at the follow up, please get all Hospital records sent to your Prim MD by signing hospital release before you go home.   If you experience worsening of your admission symptoms, develop shortness of breath, life threatening emergency, suicidal or homicidal thoughts you must seek medical attention immediately by calling 911 or calling your MD immediately  if symptoms less severe.  You Must read complete instructions/literature along with all the possible adverse reactions/side effects for all the Medicines you take and that have been prescribed to you. Take any new Medicines after you have completely understood and accpet all the possible adverse reactions/side effects.   Do not drive, operating heavy machinery, perform activities at heights, swimming or participation in water activities or provide baby sitting services if your were admitted for syncope or siezures until you have seen by Primary MD or a Neurologist and advised to do so again.  Do not drive when taking Pain medications.    Do not take more than prescribed Pain, Sleep and Anxiety Medications  Special Instructions: If you have smoked or chewed Tobacco  in the last 2 yrs please stop smoking, stop any regular Alcohol  and or any Recreational drug use.  Wear Seat belts while driving.   Please note  You were cared for by a hospitalist during your hospital stay. If you have any questions about your discharge medications or the care you received while you were in the hospital after you are discharged, you can call the unit and asked to  speak with the hospitalist on call if the hospitalist that took care of you is not available. Once you are discharged, your primary care physician will handle any further medical issues. Please note that NO REFILLS for any discharge medications will be authorized once you are discharged, as it is imperative that you return to your primary care physician (or establish a relationship with a primary care physician if you do not have one) for your aftercare needs so that they can reassess your need for medications and monitor your lab values.     Do not sit on low chairs, stoools or toilet seats, as it may be difficult to get up from low surfaces    Complete by:  As directed      Driving restrictions    Complete by:  As directed   No driving until released by the physician.     Increase activity slowly as tolerated    Complete by:  As directed      Increase activity slowly    Complete by:  As directed      Lifting restrictions    Complete by:  As directed   No lifting until released by the physician.     Patient may shower    Complete by:  As directed   You may shower without a dressing once there is no drainage.  Do not wash over the wound.  If drainage remains, do not shower until drainage stops.     Weight bearing as tolerated    Complete by:  As directed   Laterality:  right  Extremity:  Lower     Weight bearing as  tolerated    Complete by:  As directed   Laterality:  right  Extremity:  Lower            Medication List    TAKE these medications        acetaminophen 325 MG tablet  Commonly known as:  TYLENOL  Take 2 tablets (650 mg total) by mouth every 6 (six) hours as needed for mild pain, fever or headache (or Fever >/= 101).     atenolol 50 MG tablet  Commonly known as:  TENORMIN  Take 50 mg by mouth daily.     ELIQUIS 2.5 MG Tabs tablet  Generic drug:  apixaban  Take 2.5 mg by mouth 2 (two) times daily.     feeding supplement (ENSURE ENLIVE) Liqd  Take 237 mLs by  mouth daily. Or facility alternative formulary     HYDROcodone-acetaminophen 5-325 MG tablet  Commonly known as:  NORCO/VICODIN  Take 1-2 tablets by mouth every 6 (six) hours as needed for moderate pain.     levothyroxine 75 MCG tablet  Commonly known as:  SYNTHROID, LEVOTHROID  Take 1 tablet (75 mcg total) by mouth daily.     pravastatin 40 MG tablet  Commonly known as:  PRAVACHOL  Take 1 tablet (40 mg total) by mouth at bedtime.     senna-docusate 8.6-50 MG tablet  Commonly known as:  Senokot-S  Take 1 tablet by mouth at bedtime as needed for mild constipation.     traMADol 50 MG tablet  Commonly known as:  ULTRAM  Take 1-2 tablets (50-100 mg total) by mouth every 6 (six) hours as needed (mild pain).     Vitamin D-3 1000 units Caps  Take 3,000 Units by mouth daily.     zolpidem 5 MG tablet  Commonly known as:  AMBIEN  Take 1 tablet (5 mg total) by mouth daily.          Diet and Activity recommendation: See Discharge Instructions above   Consults obtained - ortho   Major procedures and Radiology Reports - PLEASE review detailed and final reports for all details, in brief -      Dg Chest 1 View  03/20/2015  CLINICAL DATA:  Found down today at nursing home. EXAM: CHEST  1 VIEW; DG HIP (WITH OR WITHOUT PELVIS) 2-3V RIGHT COMPARISON:  Chest x-ray 03/15/2015 FINDINGS: One view chest: The cardiac silhouette, mediastinal and hilar contours are within normal limits and stable. There is tortuosity of the thoracic aorta. Stable moderate-sized hiatal hernia. No acute pulmonary findings. The bony thorax is intact. Stable advanced degenerative changes involving both shoulders. Pelvis/right hip: There is a displaced intertrochanteric fracture of the right hip. Moderate varus deformity. The left hip is intact. The pubic symphysis and SI joints are intact. No definite pelvic fractures. IMPRESSION: 1. Displaced intertrochanteric fracture of the right hip. 2. No acute cardiopulmonary  findings. Electronically Signed   By: Marijo Sanes M.D.   On: 03/20/2015 17:57   Dg Chest 2 View  03/15/2015  CLINICAL DATA:  Fall from the commode last evening, no specific chest pain noted. EXAM: CHEST  2 VIEW COMPARISON:  None. FINDINGS: Cardiac shadow is mildly enlarged. The lungs are clear. No pneumothorax is seen. No rib fractures are noted. Degenerative changes of both shoulder joints and thoracic spine are seen. IMPRESSION: No acute abnormality noted. Electronically Signed   By: Inez Catalina M.D.   On: 03/15/2015 16:01   Dg Pelvis 1-2 Views  03/15/2015  CLINICAL DATA:  Fall last night off toilet. EXAM: PELVIS - 1-2 VIEW COMPARISON:  None. FINDINGS: Mild symmetric degenerative changes in the hips. No acute bony abnormality. Specifically, no fracture, subluxation, or dislocation. Soft tissues are intact. IMPRESSION: No acute bony abnormality. Electronically Signed   By: Rolm Baptise M.D.   On: 03/15/2015 16:01   Ct Head Wo Contrast  03/20/2015  CLINICAL DATA:  [place] today, found lying on floor, denies loss of consciousness, mild confusion EXAM: CT HEAD WITHOUT CONTRAST CT CERVICAL SPINE WITHOUT CONTRAST TECHNIQUE: Multidetector CT imaging of the head and cervical spine was performed following the standard protocol without intravenous contrast. Multiplanar CT image reconstructions of the cervical spine were also generated. COMPARISON:  03/15/2015 FINDINGS: CT HEAD FINDINGS Severe diffuse atrophy. Encephalomalacia left occipital lobe. No evidence of acute infarct or mass. No hemorrhage or extra-axial fluid. No skull fracture. Visualized portions of the paranasal sinuses are clear. CT CERVICAL SPINE FINDINGS Stable grade 1 anterior listhesis of C4 on C5 stable degenerative disc disease throughout the cervical spine most severe at C5-6 and C6-7. Stable mild compression deformity involving the superior endplate of T1. Stable large bilateral multinodular thyroid goiter. Unchanged measurements from 03/15/2015.  IMPRESSION: No change when compared to recent prior study. No acute intracranial abnormalities. There is stable chronic involutional change and left occipital lobe encephalomalacia. Stable mild T1 compression deformity. Electronically Signed   By: Skipper Cliche M.D.   On: 03/20/2015 17:40   Ct Head Wo Contrast  03/15/2015  CLINICAL DATA:  Status post fall, denies loss consciousness EXAM: CT HEAD WITHOUT CONTRAST CT CERVICAL SPINE WITHOUT CONTRAST TECHNIQUE: Multidetector CT imaging of the head and cervical spine was performed following the standard protocol without intravenous contrast. Multiplanar CT image reconstructions of the cervical spine were also generated. COMPARISON:  None. FINDINGS: CT HEAD FINDINGS There is no evidence of mass effect, midline shift, or extra-axial fluid collections. There is no evidence of a space-occupying lesion or intracranial hemorrhage. There is no evidence of a cortical-based area of acute infarction. There is an old right parietal lobe infarct with encephalomalacia. There is generalized cerebral atrophy. There is periventricular white matter low attenuation likely secondary to microangiopathy. The ventricles and sulci are appropriate for the patient's age. The basal cisterns are patent. Visualized portions of the orbits are unremarkable. The visualized portions of the paranasal sinuses and mastoid air cells are unremarkable. Cerebrovascular atherosclerotic calcifications are noted. The osseous structures are unremarkable. CT CERVICAL SPINE FINDINGS The alignment is anatomic. There is a compression fracture of the T1 vertebral body with approximately 10% height loss. There is 4 mm of anterolisthesis of C4 on C5 secondary to facet disease. The prevertebral soft tissues are normal. The intraspinal soft tissues are not fully imaged on this examination due to poor soft tissue contrast, but there is no gross soft tissue abnormality. Degenerative disc disease with disc height loss at  C5-6 and C6-7. There is osseous fusion of the posterior elements at C2-3. There is moderate bilateral facet arthropathy at C3-4. There is bilateral osseous fusion of the posterior elements at C4-5. There is bilateral facet arthropathy at C5-6, C6-7, C7-T1 and T1-2. There is a broad-based disc osteophyte complex at C6-7. The visualized portions of the lung apices demonstrate no focal abnormality. Severe enlargement of the right thyroid gland measuring 7 x 4.4 cm and rightward deviation of the trachea. Nodular in enlargement of the left thyroid gland measures 3.6 x 2.4 cm. IMPRESSION: 1. No acute intracranial pathology. 2. Compression fracture of the T1  vertebral body with approximately 10% height loss. 3. Large multinodular thyroid goiter. Electronically Signed   By: Kathreen Devoid   On: 03/15/2015 16:06   Ct Cervical Spine Wo Contrast  03/20/2015  CLINICAL DATA:  [place] today, found lying on floor, denies loss of consciousness, mild confusion EXAM: CT HEAD WITHOUT CONTRAST CT CERVICAL SPINE WITHOUT CONTRAST TECHNIQUE: Multidetector CT imaging of the head and cervical spine was performed following the standard protocol without intravenous contrast. Multiplanar CT image reconstructions of the cervical spine were also generated. COMPARISON:  03/15/2015 FINDINGS: CT HEAD FINDINGS Severe diffuse atrophy. Encephalomalacia left occipital lobe. No evidence of acute infarct or mass. No hemorrhage or extra-axial fluid. No skull fracture. Visualized portions of the paranasal sinuses are clear. CT CERVICAL SPINE FINDINGS Stable grade 1 anterior listhesis of C4 on C5 stable degenerative disc disease throughout the cervical spine most severe at C5-6 and C6-7. Stable mild compression deformity involving the superior endplate of T1. Stable large bilateral multinodular thyroid goiter. Unchanged measurements from 03/15/2015. IMPRESSION: No change when compared to recent prior study. No acute intracranial abnormalities. There is stable  chronic involutional change and left occipital lobe encephalomalacia. Stable mild T1 compression deformity. Electronically Signed   By: Skipper Cliche M.D.   On: 03/20/2015 17:40   Ct Cervical Spine Wo Contrast  03/15/2015  CLINICAL DATA:  Status post fall, denies loss consciousness EXAM: CT HEAD WITHOUT CONTRAST CT CERVICAL SPINE WITHOUT CONTRAST TECHNIQUE: Multidetector CT imaging of the head and cervical spine was performed following the standard protocol without intravenous contrast. Multiplanar CT image reconstructions of the cervical spine were also generated. COMPARISON:  None. FINDINGS: CT HEAD FINDINGS There is no evidence of mass effect, midline shift, or extra-axial fluid collections. There is no evidence of a space-occupying lesion or intracranial hemorrhage. There is no evidence of a cortical-based area of acute infarction. There is an old right parietal lobe infarct with encephalomalacia. There is generalized cerebral atrophy. There is periventricular white matter low attenuation likely secondary to microangiopathy. The ventricles and sulci are appropriate for the patient's age. The basal cisterns are patent. Visualized portions of the orbits are unremarkable. The visualized portions of the paranasal sinuses and mastoid air cells are unremarkable. Cerebrovascular atherosclerotic calcifications are noted. The osseous structures are unremarkable. CT CERVICAL SPINE FINDINGS The alignment is anatomic. There is a compression fracture of the T1 vertebral body with approximately 10% height loss. There is 4 mm of anterolisthesis of C4 on C5 secondary to facet disease. The prevertebral soft tissues are normal. The intraspinal soft tissues are not fully imaged on this examination due to poor soft tissue contrast, but there is no gross soft tissue abnormality. Degenerative disc disease with disc height loss at C5-6 and C6-7. There is osseous fusion of the posterior elements at C2-3. There is moderate bilateral  facet arthropathy at C3-4. There is bilateral osseous fusion of the posterior elements at C4-5. There is bilateral facet arthropathy at C5-6, C6-7, C7-T1 and T1-2. There is a broad-based disc osteophyte complex at C6-7. The visualized portions of the lung apices demonstrate no focal abnormality. Severe enlargement of the right thyroid gland measuring 7 x 4.4 cm and rightward deviation of the trachea. Nodular in enlargement of the left thyroid gland measures 3.6 x 2.4 cm. IMPRESSION: 1. No acute intracranial pathology. 2. Compression fracture of the T1 vertebral body with approximately 10% height loss. 3. Large multinodular thyroid goiter. Electronically Signed   By: Kathreen Devoid   On: 03/15/2015 16:06  Dg C-arm 1-60 Min-no Report  03/22/2015  CLINICAL DATA: hip C-ARM 1-60 MINUTES Fluoroscopy was utilized by the requesting physician.  No radiographic interpretation.   Dg Hip Operative Unilat W Or W/o Pelvis Right  03/22/2015  CLINICAL DATA:  Intratrochanteric fracture EXAM: OPERATIVE RIGHT HIP WITH PELVIS COMPARISON:  None. FLUOROSCOPY TIME:  Radiation Exposure Index (as provided by the fluoroscopic device): 8.4 mGy If the device does not provide the exposure index: Fluoroscopy Time:  32 seconds Number of Acquired Images:  3 FINDINGS: A short medullary rod and fixation screw are noted traversing the proximal femur. Fracture fragments are in near anatomic alignment with the exception of the lesser trochanter fragment. It is mildly displaced. No other focal abnormality is seen. IMPRESSION: Status post ORIF of proximal right femoral fracture. Electronically Signed   By: Inez Catalina M.D.   On: 03/22/2015 18:43   Dg Hip Unilat With Pelvis 2-3 Views Right  03/20/2015  CLINICAL DATA:  Found down today at nursing home. EXAM: CHEST  1 VIEW; DG HIP (WITH OR WITHOUT PELVIS) 2-3V RIGHT COMPARISON:  Chest x-ray 03/15/2015 FINDINGS: One view chest: The cardiac silhouette, mediastinal and hilar contours are within normal  limits and stable. There is tortuosity of the thoracic aorta. Stable moderate-sized hiatal hernia. No acute pulmonary findings. The bony thorax is intact. Stable advanced degenerative changes involving both shoulders. Pelvis/right hip: There is a displaced intertrochanteric fracture of the right hip. Moderate varus deformity. The left hip is intact. The pubic symphysis and SI joints are intact. No definite pelvic fractures. IMPRESSION: 1. Displaced intertrochanteric fracture of the right hip. 2. No acute cardiopulmonary findings. Electronically Signed   By: Marijo Sanes M.D.   On: 03/20/2015 17:57    Micro Results     Recent Results (from the past 240 hour(s))  Urine culture     Status: None (Preliminary result)   Collection Time: 03/20/15  6:40 PM  Result Value Ref Range Status   Specimen Description URINE, CATHETERIZED  Final   Special Requests NONE  Final   Culture   Final    >=100,000 COLONIES/mL PROTEUS MIRABILIS CULTURE REINCUBATED FOR BETTER GROWTH Performed at [facility]    Report Status PENDING  Incomplete  MRSA PCR Screening     Status: None   Collection Time: 03/21/15  5:14 AM  Result Value Ref Range Status   MRSA by PCR NEGATIVE NEGATIVE Final    Comment:        The GeneXpert MRSA Assay (FDA approved for NASAL specimens only), is one component of a comprehensive MRSA colonization surveillance program. It is not intended to diagnose MRSA infection nor to guide or monitor treatment for MRSA infections.        Today   Subjective:   Jecenia Seavey today has no headache,no chest or abdominal pain,no new weakness tingling or numbness, feels much better  today.  Objective:   Blood pressure 156/82, pulse 76, temperature 97.9 F (36.6 C), temperature source Oral, resp. rate 18, height 5\' 4"  (1.626 m), weight 53.978 kg (119 lb), last menstrual period 01/08/1972, SpO2 98 %.   Intake/Output Summary (Last 24 hours) at 03/24/15 1133 Last data filed at  03/23/15 2300  Gross per 24 hour  Intake   1440 ml  Output    600 ml  Net    840 ml    Exam Awake Alert, Confused, demented but pleasant. Supple Neck,No JVD,  Symmetrical Chest wall movement, Good air movement bilaterally, CTAB ,No Gallops,Rubs or new  Murmurs, No Parasternal Heave +ve B.Sounds, Abd Soft, Non tender, No organomegaly appriciated, No rebound -guarding or rigidity. No Cyanosis, Clubbing or edema, No new Rash or bruise  Data Review   CBC w Diff: Lab Results  Component Value Date   WBC 8.1 03/23/2015   HGB 9.5* 03/23/2015   HCT 29.1* 03/23/2015   PLT 188 03/23/2015   LYMPHOPCT 5 03/20/2015   MONOPCT 7 03/20/2015   EOSPCT 0 03/20/2015   BASOPCT 0 03/20/2015    CMP: Lab Results  Component Value Date   NA 137 03/23/2015   K 4.2 03/23/2015   CL 107 03/23/2015   CO2 20* 03/23/2015   BUN 37* 03/23/2015   CREATININE 1.45* 03/23/2015   PROT 6.4* 03/20/2015   ALBUMIN 3.5 03/20/2015   BILITOT 0.8 03/20/2015   ALKPHOS 51 03/20/2015   AST 27 03/20/2015   ALT 15 03/20/2015  .   Total Time in preparing paper work, data evaluation and todays exam - 35 minutes  Abdoulie Tierce M.D on 03/24/2015 at 11:33 AM  [facility]   Office  [phone]

## 2015-03-24 NOTE — Discharge Instructions (Signed)
Open reduction and internal fixation (ORIF) is a common surgery used to fix a hip fracture or a broken hip. A surgical cut (incision) in the skin will be made to open the fracture area. This lets the surgeon see the broken bone. The bone pieces will then be put back together. Hardware, such as screws, pins, rods, or a metal plate, will be used to hold the bones in place. LET [facility] CARE PROVIDER KNOW ABOUT:  Any allergies you have.  All medicines you are taking, including vitamins, herbs, eye drops, creams, and over-the-counter medicines.  Previous problems you or members of your family have had with the use of anesthetics.  Any blood disorders you have.  Previous surgeries you have had.  Medical conditions you have.  Your tobacco use. RISKS AND COMPLICATIONS  Generally, this is a safe procedure. However, problems can occur and include:  Blood clots in the legs or lungs.  Bleeding.  Infection.  Lung infection (pneumonia).  Continuing pain.  Difficulty walking. BEFORE THE PROCEDURE  A medical evaluation will be done. This examination will include checking your heart and lungs.  You may have imaging tests, including:  X-rays to find exactly where the break is.  CT scan to get a better view of the broken hip.  MRI to check for any hidden fracture that cannot be seen on X-ray or CT scan.  You may also have blood and urine tests.  Ask your health care provider about changing or stopping your regular medicines. This is especially important if you are taking diabetes medicines or blood thinners, such as aspirin and ibuprofen. Do not take these medicines before your procedure if your health care provider asks you not to.  Do not eat or drink anything after midnight on the night before the procedure or as directed by your health care provider.  Plan to have someone take you home after you are released from the hospital. [place] will be given one of the  following:  A medicine that numbs the area (local anesthetic).  A medicine that makes you fall asleep (general anesthetic).  Small monitors will be put on your body. They will be used to check your heart, blood pressure, and oxygen level.  An IV tube will be inserted in your arm. Medicine will flow directly into your body through the IV tube.  A catheter may be inserted into your bladder to collect urine while you are asleep.  Your hip area will be scrubbed with a germ-killing solution (antiseptic).  When you are asleep or numb, the surgeon will move your bones back into normal alignment and position.  X-rays may be taken to check the position of the bones.  A skin incision will be made over the hip. It will go through your muscles to the broken bone.  The bones will be secured. Hardware will be used to hold the bone together.  The incision will be closed with small stitches (sutures) or staples.  A bandage (dressing) or some other kind of wound cover will be placed over the incision. AFTER THE PROCEDURE  Your blood pressure, heart rate, breathing rate, and blood oxygen level will be monitored often until the medicines you were given have worn off.  You may continue to receive fluids or pain medicines through the IV tube.  It is important for you to be up and moving as soon as possible after this procedure.  Physical therapists will help you to start walking.  You may  need to use a walker or crutches after the procedure.  Follow your health care provider's instructions about bearing weight on your injured leg.  You may have to wear compression stockings. These stockings help to prevent blood clots and reduce swelling in your legs.   This information is not intended to replace advice given to you by your health care provider. Make sure you discuss any questions you have with your health care provider.   Document Released: 12/12/2008 Document Revised: 01/14/2014 Document  Reviewed: 05/27/2013 Elsevier Interactive Patient Education [employer].

## 2015-03-24 NOTE — Clinical Social Work Placement (Signed)
Patient is set to discharge to [facility] today. Patient & son, [name] at bedside aware. Discharge packet given to RN, Gregary Signs. [facility] called for transport to pickup at 12:30pm.     Raynaldo Opitz, [place] Social Worker cell #: [phone]    CLINICAL SOCIAL WORK PLACEMENT  NOTE  Date:  03/24/2015  Patient Details  Name: Marie Robertson MRN: [id number] Date of Birth: May 02, 1920  Clinical Social Work is seeking post-discharge placement for this patient at the [place] level of care (*CSW will initial, date and re-position this form in  chart as items are completed):  Yes   Patient/family provided with [facility] Work Department's list of facilities offering this level of care within the geographic area requested by the patient (or if unable, by the patient's family).  Yes   Patient/family informed of their freedom to choose among providers that offer the needed level of care, that participate in Medicare, Medicaid or managed care program needed by the patient, have an available bed and are willing to accept the patient.  Yes   Patient/family informed of [facility]'s ownership interest in [facility] and [facility], as well as of the fact that they are under no obligation to receive care at these facilities.  PASRR submitted to EDS on 03/22/15     PASRR number received on 03/22/15     Existing PASRR number confirmed on       FL2 transmitted to all facilities in geographic area requested by pt/family on 03/22/15     FL2 transmitted to all facilities within larger geographic area on       Patient informed that his/her managed care company has contracts with or will negotiate with certain facilities, including the following:        Yes   Patient/family informed of bed offers received.  Patient chooses bed at [facility]     Physician recommends and patient chooses bed at      Patient  to be transferred to [facility] on 03/24/15.  Patient to be transferred to facility by [facility]     Patient family notified on 03/24/15 of transfer.  Name of family member notified:  patient's son, [name] at bedside     PHYSICIAN       Additional Comment:    _______________________________________________ Standley Brooking, LCSW 03/24/2015, 11:54 AM

## 2015-03-24 NOTE — Progress Notes (Signed)
Nutrition Follow-up  DOCUMENTATION CODES:   Not applicable  INTERVENTION:  - Continue Ensure Enlive once/day - RD will continue to monitor for needs  NUTRITION DIAGNOSIS:   Increased nutrient needs related to other (see comment) (s/p surgery) as evidenced by estimated needs. -revised  GOAL:   Patient will meet greater than or equal to 90% of their needs -likely met on average  MONITOR:   PO intake, Supplement acceptance, Weight trends, Labs, Skin, I & O's  ASSESSMENT:   82 y.o. female with a past medical history significant for Afib on apixaban, hypothyroidism, dementia, and HTN who presents with hip pain after a fall.  3/17 Physical assessment shows mild muscle wasting to upper body, no fat wasting present. Per chart review, pt ate 100% of breakfast and 50% of lunch yesterday (3/16). Pt states that she ate well for breakfast which son, who is at bedside confirms. He states that for lunch yesterday pt ate pizza and for dinner she had pot roast. Pt has not had abdominal pain or nausea or any chewing/swallowing issues with intakes since surgery. Pt is POD #2 R hip nailing.   Pt is meeting needs on average. Protein needs slightly increased post-op. Medications reviewed. IVF: NS @ 75 mL/hr. Labs reviewed; BUN/creatinine elevated but trending down, Ca: 7.7 mg/dL, GFR: 30.   3/14 - Pt is NPO but diet likely to be advanced shortly as surgery not to occur today for R hip fx s/p fall.  - Information is provided by pt's son who is at bedside.  - PTA pt was living at independent living facility and had breakfast on her own and had lunch and dinner in the dining area.  - Pt has not had anything to eat x2 days, since 03/19/15, per son's report.  - Son states that pt had been eating well PTA and she has no swallowing difficulties but does need softer foods due to many missing teeth.  - She has dentures but they have not been used for an unknown amount of time and need to be cleaned and  reassessed for fit.  - Son reports pt weighed 120 lbs around Christmas-time and has lost weight since then; he reports current weight as 112 lbs with chart review indicating current weight of 119 lbs. - Based on son's report, pt has lost 8 lbs (7% body weight) in the past 2.5 months which is significant for time frame.  - Based on chart review, pt has lost 3 lbs (2.5% body weight) in the past 1 month which is not significant for time frame.  - Unable to complete physical assessment.    Diet Order:  Diet regular Room service appropriate?: Yes; Fluid consistency:: Thin Diet - low sodium heart healthy  Skin:  Wound (see comment) (R hip incision from 03/22/15)  Last BM:  PTA  Height:   Ht Readings from Last 1 Encounters:  03/21/15 '5\' 4"'$  (1.626 m)    Weight:   Wt Readings from Last 1 Encounters:  03/21/15 119 lb (53.978 kg)    Ideal Body Weight:  54.54 kg (kg)  BMI:  Body mass index is 20.42 kg/(m^2).  Estimated Nutritional Needs:   Kcal:  1100-1300  Protein:  60-70 grams  Fluid:  >/= 1.5 L/day  EDUCATION NEEDS:   No education needs identified at this time     Jarome Matin, RD, LDN Inpatient Clinical Dietitian Pager # [phone] After hours/weekend pager # [phone]

## 2015-03-25 LAB — URINE CULTURE: Culture: >=100000

## 2015-03-26 ENCOUNTER — Encounter (HOSPITAL_COMMUNITY): Payer: Self-pay | Admitting: Family Medicine

## 2015-03-28 ENCOUNTER — Non-Acute Institutional Stay (SKILLED_NURSING_FACILITY): Payer: Medicare Other | Admitting: Internal Medicine

## 2015-03-28 DIAGNOSIS — E034 Atrophy of thyroid (acquired): Secondary | ICD-10-CM | POA: Diagnosis not present

## 2015-03-28 DIAGNOSIS — S72001D Fracture of unspecified part of neck of right femur, subsequent encounter for closed fracture with routine healing: Secondary | ICD-10-CM

## 2015-03-28 DIAGNOSIS — I482 Chronic atrial fibrillation, unspecified: Secondary | ICD-10-CM

## 2015-03-28 DIAGNOSIS — N390 Urinary tract infection, site not specified: Secondary | ICD-10-CM | POA: Diagnosis not present

## 2015-03-28 DIAGNOSIS — E038 Other specified hypothyroidism: Secondary | ICD-10-CM

## 2015-03-28 DIAGNOSIS — E559 Vitamin D deficiency, unspecified: Secondary | ICD-10-CM | POA: Diagnosis not present

## 2015-03-28 DIAGNOSIS — G9340 Encephalopathy, unspecified: Secondary | ICD-10-CM | POA: Diagnosis not present

## 2015-03-28 DIAGNOSIS — I10 Essential (primary) hypertension: Secondary | ICD-10-CM

## 2015-03-28 DIAGNOSIS — E785 Hyperlipidemia, unspecified: Secondary | ICD-10-CM

## 2015-03-28 NOTE — Progress Notes (Signed)
Actually, it is ok. Looking back over things again I think she has been adequately treated with the abx she got in the hospital. No new orders needed.-thx

## 2015-03-29 ENCOUNTER — Encounter: Payer: Self-pay | Admitting: Internal Medicine

## 2015-03-29 DIAGNOSIS — G9340 Encephalopathy, unspecified: Secondary | ICD-10-CM

## 2015-03-29 DIAGNOSIS — E559 Vitamin D deficiency, unspecified: Secondary | ICD-10-CM

## 2015-03-29 HISTORY — DX: Encephalopathy, unspecified: G93.40

## 2015-03-29 HISTORY — DX: Vitamin D deficiency, unspecified: E55.9

## 2015-03-29 NOTE — Assessment & Plan Note (Signed)
SNF - not stated as uncontrolled ; cont repletion 3000 u daily

## 2015-03-29 NOTE — Assessment & Plan Note (Signed)
SNF - resolved; prob 2/2 UTI and acute fx; will monitor

## 2015-03-29 NOTE — Assessment & Plan Note (Signed)
SNF - cont pravachol 40 mg daily; not stated as uncontrolled , LFT's normal

## 2015-03-29 NOTE — Assessment & Plan Note (Signed)
SNF - TSH 0.183; cont synthroid 75 mcg daily

## 2015-03-29 NOTE — Assessment & Plan Note (Signed)
SNF - controlled on atenolol; cont current med

## 2015-03-29 NOTE — Assessment & Plan Note (Signed)
S/p ORIF; SNF - admit to SNF for OT/PT

## 2015-03-29 NOTE — Assessment & Plan Note (Signed)
SNF - tx in hospital, not d/c with any antibiotics

## 2015-03-29 NOTE — Progress Notes (Signed)
MRN: [id number] Name: Mar Schwabauer  Sex: female Age: 82 y.o. DOB: 1920/03/01  [place] [facility]: [facility] Facility/Room:111 Level Of Care: SNF Provider: Inocencio Homes D Emergency Contacts: Extended Emergency Contact Information Primary Emergency Contact: [facility] Address: [address]          [place], [place] [address] [place] Phone: [phone] Relation: Son Secondary Emergency Contact: [place], [place] [address] [place] Phone: [phone] Mobile Phone: [phone] Relation: Son  Code Status:   Allergies: Review of patient's allergies indicates no known allergies.  Chief Complaint  Patient presents with  . New Admit To SNF    HPI: Patient is 82 y.o. female with Afib on apixaban, hypothyroidism, dementia, and HTN who presented with hip pain after a fall. Patient was delirious on arrival but was found on the floor of her facility by staff in the morning. The sons were aware of no other complaints of chest pain, dizziness, pre-syncope, or micturation preceding the fall, but note that this is her second fall requiring ER visit in the last week. In the ED, the patient was hemodynamically stable and a plain radiograph of the right hip showed a displaced intertrochanteric fracture. Pt was admitted to [facility] from 3/13-17 where her hip was repaired. Hospital course was complicated by anemia, requiring a transfusion,delirium and a UTI. Pt is admitted to SNF for generalized weakness. While at SNF pt will be followed for HTN, tx with atenolol, CAF, tx with atenolol and eliquis and hypothyroidism, tx with synthroid.  Past Medical History  Diagnosis Date  . Osteoarthritis   . Depression   . Hypertension   . Chronic atrial fibrillation ([place])   . Hypothyroidism   . Constipation   . Insomnia   . Hyperlipidemia   . Osteoporosis   . CVA (cerebral infarction) approx 2012    residual defect: poor R sided peripheral vision per son  .  History of fracture of right hip 03/2015    Past Surgical History  Procedure Laterality Date  . Appendectomy    . Knee arthroscopy Right   . Thyroid surgery    . Colonoscopy  08/22/08  . Intramedullary (im) nail intertrochanteric Right 03/22/2015    Procedure: RIGHT femoral HIP NAILING;  Surgeon: Gaynelle Arabian, MD;  Location: [facility];  Service: Orthopedics;  Laterality: Right;      Medication List       This list is accurate as of: 03/28/15 11:59 PM.  Always use your most recent med list.               acetaminophen 325 MG tablet  Commonly known as:  TYLENOL  Take 2 tablets (650 mg total) by mouth every 6 (six) hours as needed for mild pain, fever or headache (or Fever >/= 101).     atenolol 50 MG tablet  Commonly known as:  TENORMIN  Take 50 mg by mouth daily.     ELIQUIS 2.5 MG Tabs tablet  Generic drug:  apixaban  Take 2.5 mg by mouth 2 (two) times daily.     feeding supplement (ENSURE ENLIVE) Liqd  Take 237 mLs by mouth daily. Or facility alternative formulary     HYDROcodone-acetaminophen 5-325 MG tablet  Commonly known as:  NORCO/VICODIN  Take 1-2 tablets by mouth every 6 (six) hours as needed for moderate pain.     levothyroxine 75 MCG tablet  Commonly known as:  SYNTHROID, LEVOTHROID  Take 1  tablet (75 mcg total) by mouth daily.     pravastatin 40 MG tablet  Commonly known as:  PRAVACHOL  Take 1 tablet (40 mg total) by mouth at bedtime.     senna-docusate 8.6-50 MG tablet  Commonly known as:  Senokot-S  Take 1 tablet by mouth at bedtime as needed for mild constipation.     traMADol 50 MG tablet  Commonly known as:  ULTRAM  Take 1-2 tablets (50-100 mg total) by mouth every 6 (six) hours as needed (mild pain).     Vitamin D-3 1000 units Caps  Take 3,000 Units by mouth daily.     zolpidem 5 MG tablet  Commonly known as:  AMBIEN  Take 1 tablet (5 mg total) by mouth daily.        No orders of the defined types were placed in this encounter.     Immunization History  Administered Date(s) Administered  . Influenza Split 11/20/2012  . Tdap 01/01/2015    Social History  Substance Use Topics  . Smoking status: Never Smoker   . Smokeless tobacco: Never Used  . Alcohol Use: No    Family history is + DM2, stroke, ETOH abuse  Review of Systems  DATA OBTAINED: from patient, nurse GENERAL:  no fevers, fatigue, appetite changes SKIN: No itching, rash or wounds EYES: No eye pain, redness, discharge EARS: No earache, tinnitus, change in hearing NOSE: No congestion, drainage or bleeding  MOUTH/THROAT: No mouth or tooth pain, No sore throat RESPIRATORY: No cough, wheezing, SOB CARDIAC: No chest pain, palpitations, lower extremity edema  GI: No abdominal pain, No N/V/D or constipation, No heartburn or reflux  GU: No dysuria, frequency or urgency, or incontinence  MUSCULOSKELETAL: No unrelieved bone/joint pain NEUROLOGIC: No headache, dizziness or focal weakness PSYCHIATRIC: No c/o anxiety or sadness   Filed Vitals:   03/29/15 2126  BP: 119/81  Pulse: 105  Temp: 97.3 F (36.3 C)  Resp: 18    SpO2 Readings from Last 1 Encounters:  03/24/15 98%        Physical Exam  GENERAL APPEARANCE: Alert, conversant,  No acute distress.  SKIN: No diaphoresis rash HEAD: Normocephalic, atraumatic  EYES: Conjunctiva/lids clear. Pupils round, reactive. EOMs intact.  EARS: External exam WNL, canals clear. Hearing grossly normal.  NOSE: No deformity or discharge.  MOUTH/THROAT: Lips w/o lesions  RESPIRATORY: Breathing is even, unlabored. Lung sounds are clear   CARDIOVASCULAR: Heart irreg,  no murmurs, rubs or gallops. No peripheral edema.   GASTROINTESTINAL: Abdomen is soft, non-tender, not distended w/ normal bowel sounds. GENITOURINARY: Bladder non tender, not distended  MUSCULOSKELETAL: No abnormal joints or musculature NEUROLOGIC:  Cranial nerves 2-12 grossly intact. Moves all extremities  PSYCHIATRIC: Mood and affect  appropriate to situation, no behavioral issues  Patient Active Problem List   Diagnosis Date Noted  . Acute encephalopathy 03/29/2015  . Vitamin D deficiency 03/29/2015  . UTI (lower urinary tract infection) 03/21/2015  . Hip fracture ([place]) 03/20/2015  . Chronic atrial fibrillation ([place]) 02/20/2015  . Hyperlipidemia 02/20/2015  . Essential hypertension, benign 12/13/2012  . Cardiac arrhythmia 12/13/2012  . Urinary incontinence 12/13/2012  . Hypothyroidism 12/13/2012  . History of depression 12/11/2012    CBC    Component Value Date/Time   WBC 8.1 03/23/2015 0439   RBC 3.25* 03/23/2015 0439   HGB 9.5* 03/23/2015 0439   HCT 29.1* 03/23/2015 0439   PLT 188 03/23/2015 0439   MCV 89.5 03/23/2015 0439   LYMPHSABS 0.6* 03/20/2015 1700  MONOABS 0.9 03/20/2015 1700   EOSABS 0.0 03/20/2015 1700   BASOSABS 0.0 03/20/2015 1700    CMP     Component Value Date/Time   NA 137 03/23/2015 0439   K 4.2 03/23/2015 0439   CL 107 03/23/2015 0439   CO2 20* 03/23/2015 0439   GLUCOSE 150* 03/23/2015 0439   BUN 37* 03/23/2015 0439   CREATININE 1.45* 03/23/2015 0439   CALCIUM 7.7* 03/23/2015 0439   PROT 6.4* 03/20/2015 1700   ALBUMIN 3.5 03/20/2015 1700   AST 27 03/20/2015 1700   ALT 15 03/20/2015 1700   ALKPHOS 51 03/20/2015 1700   BILITOT 0.8 03/20/2015 1700   GFRNONAA 30* 03/23/2015 0439   GFRAA 35* 03/23/2015 0439    No results found for: HGBA1C   Dg Chest 1 View  03/20/2015  CLINICAL DATA:  Found down today at nursing home. EXAM: CHEST  1 VIEW; DG HIP (WITH OR WITHOUT PELVIS) 2-3V RIGHT COMPARISON:  Chest x-ray 03/15/2015 FINDINGS: One view chest: The cardiac silhouette, mediastinal and hilar contours are within normal limits and stable. There is tortuosity of the thoracic aorta. Stable moderate-sized hiatal hernia. No acute pulmonary findings. The bony thorax is intact. Stable advanced degenerative changes involving both shoulders. Pelvis/right hip: There is a displaced  intertrochanteric fracture of the right hip. Moderate varus deformity. The left hip is intact. The pubic symphysis and SI joints are intact. No definite pelvic fractures. IMPRESSION: 1. Displaced intertrochanteric fracture of the right hip. 2. No acute cardiopulmonary findings. Electronically Signed   By: Marijo Sanes M.D.   On: 03/20/2015 17:57   Ct Head Wo Contrast  03/20/2015  CLINICAL DATA:  [place] today, found lying on floor, denies loss of consciousness, mild confusion EXAM: CT HEAD WITHOUT CONTRAST CT CERVICAL SPINE WITHOUT CONTRAST TECHNIQUE: Multidetector CT imaging of the head and cervical spine was performed following the standard protocol without intravenous contrast. Multiplanar CT image reconstructions of the cervical spine were also generated. COMPARISON:  03/15/2015 FINDINGS: CT HEAD FINDINGS Severe diffuse atrophy. Encephalomalacia left occipital lobe. No evidence of acute infarct or mass. No hemorrhage or extra-axial fluid. No skull fracture. Visualized portions of the paranasal sinuses are clear. CT CERVICAL SPINE FINDINGS Stable grade 1 anterior listhesis of C4 on C5 stable degenerative disc disease throughout the cervical spine most severe at C5-6 and C6-7. Stable mild compression deformity involving the superior endplate of T1. Stable large bilateral multinodular thyroid goiter. Unchanged measurements from 03/15/2015. IMPRESSION: No change when compared to recent prior study. No acute intracranial abnormalities. There is stable chronic involutional change and left occipital lobe encephalomalacia. Stable mild T1 compression deformity. Electronically Signed   By: Skipper Cliche M.D.   On: 03/20/2015 17:40   Ct Cervical Spine Wo Contrast  03/20/2015  CLINICAL DATA:  [place] today, found lying on floor, denies loss of consciousness, mild confusion EXAM: CT HEAD WITHOUT CONTRAST CT CERVICAL SPINE WITHOUT CONTRAST TECHNIQUE: Multidetector CT imaging of the head and cervical spine was performed  following the standard protocol without intravenous contrast. Multiplanar CT image reconstructions of the cervical spine were also generated. COMPARISON:  03/15/2015 FINDINGS: CT HEAD FINDINGS Severe diffuse atrophy. Encephalomalacia left occipital lobe. No evidence of acute infarct or mass. No hemorrhage or extra-axial fluid. No skull fracture. Visualized portions of the paranasal sinuses are clear. CT CERVICAL SPINE FINDINGS Stable grade 1 anterior listhesis of C4 on C5 stable degenerative disc disease throughout the cervical spine most severe at C5-6 and C6-7. Stable mild compression deformity involving the superior  endplate of T1. Stable large bilateral multinodular thyroid goiter. Unchanged measurements from 03/15/2015. IMPRESSION: No change when compared to recent prior study. No acute intracranial abnormalities. There is stable chronic involutional change and left occipital lobe encephalomalacia. Stable mild T1 compression deformity. Electronically Signed   By: Skipper Cliche M.D.   On: 03/20/2015 17:40   Dg Hip Unilat With Pelvis 2-3 Views Right  03/20/2015  CLINICAL DATA:  Found down today at nursing home. EXAM: CHEST  1 VIEW; DG HIP (WITH OR WITHOUT PELVIS) 2-3V RIGHT COMPARISON:  Chest x-ray 03/15/2015 FINDINGS: One view chest: The cardiac silhouette, mediastinal and hilar contours are within normal limits and stable. There is tortuosity of the thoracic aorta. Stable moderate-sized hiatal hernia. No acute pulmonary findings. The bony thorax is intact. Stable advanced degenerative changes involving both shoulders. Pelvis/right hip: There is a displaced intertrochanteric fracture of the right hip. Moderate varus deformity. The left hip is intact. The pubic symphysis and SI joints are intact. No definite pelvic fractures. IMPRESSION: 1. Displaced intertrochanteric fracture of the right hip. 2. No acute cardiopulmonary findings. Electronically Signed   By: Marijo Sanes M.D.   On: 03/20/2015 17:57     Not all labs, radiology exams or other studies done during hospitalization come through on my EPIC note; however they are reviewed by me.    Assessment and Plan  Hip fracture ([place]) S/p ORIF; SNF - admit to SNF for OT/PT  UTI (lower urinary tract infection) SNF - tx in hospital, not d/c with any antibiotics  Acute encephalopathy SNF - resolved; prob 2/2 UTI and acute fx; will monitor  Essential hypertension, benign SNF - controlled on atenolol; cont current med  Chronic atrial fibrillation ([facility]) SNF - not stated as uncontrolled; cont atenolol for rate and eliqquis as prophylaxis  Hypothyroidism SNF - TSH 0.183; cont synthroid 75 mcg daily  Hyperlipidemia SNF - cont pravachol 40 mg daily; not stated as uncontrolled , LFT's normal  Vitamin D deficiency SNF - not stated as uncontrolled ; cont repletion 3000 u daily   Time spent > 45 min;> 50% of time with patient was spent reviewing records, labs, tests and studies, counseling and developing plan of care  [name], MD

## 2015-03-29 NOTE — Assessment & Plan Note (Signed)
SNF - not stated as uncontrolled; cont atenolol for rate and eliqquis as prophylaxis

## 2015-04-04 LAB — BASIC METABOLIC PANEL
BUN: 28 mg/dL — AB (ref 4–21)
CREATININE: 1.4 mg/dL — AB (ref <=1.1)
Glucose: 169 mg/dL
POTASSIUM: 4.9 mmol/L (ref 3.4–5.3)
Sodium: 135 mmol/L — AB (ref 137–147)

## 2015-04-04 LAB — CBC AND DIFFERENTIAL
HCT: 32 % — AB (ref 36–46)
HEMOGLOBIN: 10.3 g/dL — AB (ref 12.0–16.0)
Platelets: 412 10*3/uL — AB (ref 150–399)
WBC: 7.4 10*3/mL

## 2015-04-06 ENCOUNTER — Encounter: Payer: Self-pay | Admitting: Internal Medicine

## 2015-04-06 ENCOUNTER — Non-Acute Institutional Stay (SKILLED_NURSING_FACILITY): Payer: Medicare Other | Admitting: Internal Medicine

## 2015-04-06 DIAGNOSIS — S72001D Fracture of unspecified part of neck of right femur, subsequent encounter for closed fracture with routine healing: Secondary | ICD-10-CM

## 2015-04-06 DIAGNOSIS — R609 Edema, unspecified: Secondary | ICD-10-CM | POA: Diagnosis not present

## 2015-04-06 DIAGNOSIS — I482 Chronic atrial fibrillation, unspecified: Secondary | ICD-10-CM

## 2015-04-06 NOTE — Progress Notes (Signed)
Patient ID: Marie Robertson, female   DOB: [birth year], 82 y.o.   MRN: [id number]: [id number] Name: Marie Robertson  Sex: female Age: 82 y.o. DOB: 11-23-1920  [place] #: [facility] Facility/Room:111 Level Of Care: SNF Provider: Wille Celeste Emergency Contacts: Extended Emergency Contact Information Primary Emergency Contact: Marie Robertson Address: [address]          [place], [place] [address] Marie Robertson of Marie Robertson Phone: [phone] Relation: Son Secondary Emergency Contact: Marie Robertson, Marie Robertson [address] Marie Robertson of [place] Phone: [phone] Mobile Phone: [phone] Relation: Son  Code Status:   Allergies: Review of patient's allergies indicates no known allergies.  Chief Complaint  Patient presents with  . Acute Visit  Secondary to right leg edema  HPI: Patient is 82 y.o. female with Afib on apixaban, hypothyroidism, dementia, and HTN who presented with hip pain after a fall. Patient was delirious on arrival but was found on the floor of her facility by staff in the morning.  a plain radiograph of the right hip showed a displaced intertrochanteric fracture. Pt was admitted to [facility] from 3/13-17 where her hip was repaired. [facility] course was complicated by anemia, requiring a transfusion,delirium and a UTI. Pt was admitted to SNF for generalized weakness. While at SNF pt  followed for HTN, tx with atenolol, CAF, tx with atenolol and eliquis and hypothyroidism, tx with synthroid. Her stay here has been fairly unremarkable-hemoglobin appears to be rising at 10.3 on lab done March 28.  However nursing staff has noted some increased edema of her right leg-she is not really complaining of any increased pain here of note she does continue on Eliquis with a history of atrial fibrillation Her vital signs are stable she did not complain of any increased edema elsewhere or shortness of breath  Past Medical History  Diagnosis Date  . Osteoarthritis    . Depression   . Hypertension   . Chronic atrial fibrillation ([place])   . Hypothyroidism   . Constipation   . Insomnia   . Hyperlipidemia   . Osteoporosis   . CVA (cerebral infarction) approx 2012    residual defect: poor R sided peripheral vision per son  . History of fracture of right hip 03/2015    Past Surgical History  Procedure Laterality Date  . Appendectomy    . Knee arthroscopy Right   . Thyroid surgery    . Colonoscopy  08/22/08  . Intramedullary (im) nail intertrochanteric Right 03/22/2015    Procedure: RIGHT femoral HIP NAILING;  Surgeon: Gaynelle Arabian, MD;  Location: [facility];  Service: Orthopedics;  Laterality: Right;      Medication List       This list is accurate as of: 04/06/15 11:59 PM.  Always use your most recent med list.               acetaminophen 325 MG tablet  Commonly known as:  TYLENOL  Take 2 tablets (650 mg total) by mouth every 6 (six) hours as needed for mild pain, fever or headache (or Fever >/= 101).     atenolol 50 MG tablet  Commonly known as:  TENORMIN  Take 50 mg by mouth daily.     ELIQUIS 2.5 MG Tabs tablet  Generic drug:  apixaban  Take 2.5 mg by mouth 2 (two) times daily.     feeding supplement (ENSURE ENLIVE) Liqd  Take 237 mLs by mouth daily. Or facility alternative formulary  HYDROcodone-acetaminophen 5-325 MG tablet  Commonly known as:  NORCO/VICODIN  Take 1-2 tablets by mouth every 6 (six) hours as needed for moderate pain.     levothyroxine 75 MCG tablet  Commonly known as:  SYNTHROID, LEVOTHROID  Take 1 tablet (75 mcg total) by mouth daily.     pravastatin 40 MG tablet  Commonly known as:  PRAVACHOL  Take 1 tablet (40 mg total) by mouth at bedtime.     senna-docusate 8.6-50 MG tablet  Commonly known as:  Senokot-S  Take 1 tablet by mouth at bedtime as needed for mild constipation.     traMADol 50 MG tablet  Commonly known as:  ULTRAM  Take 1-2 tablets (50-100 mg total) by mouth every 6 (six) hours as  needed (mild pain).     Vitamin D-3 1000 units Caps  Take 3,000 Units by mouth daily.     zolpidem 5 MG tablet  Commonly known as:  AMBIEN  Take 1 tablet (5 mg total) by mouth daily.          Immunization History  Administered Date(s) Administered  . Influenza Split 11/20/2012  . Tdap 01/01/2015    Social History  Substance Use Topics  . Smoking status: Never Smoker   . Smokeless tobacco: Never Used  . Alcohol Use: No    Family history is + DM2, stroke, ETOH abuse  Review of Systems  DATA OBTAINED: from patient, nurse GENERAL:  no fevers, fatigue, appetite changes SKIN: No itching, rash or wounds EYES: No eye pain, redness, discharge EARS: No earache, tinnitus, change in hearing NOSE: No congestion, drainage or bleeding  MOUTH/THROAT: No mouth or tooth pain, No sore throat RESPIRATORY: No cough, wheezing, SOB CARDIAC: No chest pain, palpitationsi---has  increased right, lower extremity edema  GI: No abdominal pain, No N/V/D or constipation, No heartburn or reflux  GU: No dysuria, frequency or urgency, or incontinence  MUSCULOSKELETAL: No unrelieved bone/joint pain NEUROLOGIC: No headache, dizziness or focal weakness PSYCHIATRIC: No c/o anxiety or sadness   Filed Vitals:   04/06/15 2247  BP: 139/84  Pulse: 78  Temp: 98.1 F (36.7 C)  Resp: 18    SpO2 Readings from Last 1 Encounters:  03/24/15 98%        Physical Exam  GENERAL APPEARANCE: Alert, conversant,  No acute distress.  SKIN: No diaphoresis rashHas violaceous appearing bruising extending from her right hip to her femur and upper lower leg also venous stasis changes of shins bilaterally HEAD: Normocephalic, atraumatic  EYES: Conjunctiva/lids clear. Pupils round, reactive. EOMs intact.  EARS: External exam WNL, canals clear. Hearing grossly normal.  NOSE: No deformity or discharge.  MOUTH/THROAT: Lips w/o lesions  RESPIRATORY: Breathing is even, unlabored. Lung sounds are clear    CARDIOVASCULAR: Heart irreg,  no murmurs, rubs or gallops. Has tablets a 2+ edema right leg-with somewhat reduced pedal Marie [facility] somewhat difficult to palpate I suspect secondary to the edema-there is no increased erythema or significant tenderness to palpation or significantly increased warmth  GASTROINTESTINAL: Abdomen is soft, non-tender, not distended w/ normal bowel sounds. GENITOURINARY: Bladder non tender, not distended  MUSCULOSKELETAL: No abnormal joints or musculature NEUROLOGIC:  Cranial nerves 2-12 grossly intact. Moves all extremities  PSYCHIATRIC: Mood and affect appropriate to situation, no behavioral issues  Patient Active Problem List   Diagnosis Date Noted  . Edema 04/06/2015  . Acute encephalopathy 03/29/2015  . Vitamin D deficiency 03/29/2015  . UTI (lower urinary tract infection) 03/21/2015  . Hip fracture ([place]) 03/20/2015  .  Chronic atrial fibrillation ([place]) 02/20/2015  . Hyperlipidemia 02/20/2015  . Essential hypertension, benign 12/13/2012  . Cardiac arrhythmia 12/13/2012  . Urinary incontinence 12/13/2012  . Hypothyroidism 12/13/2012  . History of depression 12/11/2012     Labs.  04/04/2015.  WBC 7.4 hemoglobin 10.3 platelets 412.  Sodium 135 potassium 4.9 BUN 28 creatinine 1.42 CBC    Component Value Date/Time   WBC 8.1 03/23/2015 0439   RBC 3.25* 03/23/2015 0439   HGB 9.5* 03/23/2015 0439   HCT 29.1* 03/23/2015 0439   PLT 188 03/23/2015 0439   MCV 89.5 03/23/2015 0439   LYMPHSABS 0.6* 03/20/2015 1700   MONOABS 0.9 03/20/2015 1700   EOSABS 0.0 03/20/2015 1700   BASOSABS 0.0 03/20/2015 1700    CMP     Component Value Date/Time   NA 137 03/23/2015 0439   K 4.2 03/23/2015 0439   CL 107 03/23/2015 0439   CO2 20* 03/23/2015 0439   GLUCOSE 150* 03/23/2015 0439   BUN 37* 03/23/2015 0439   CREATININE 1.45* 03/23/2015 0439   CALCIUM 7.7* 03/23/2015 0439   PROT 6.4* 03/20/2015 1700   ALBUMIN 3.5 03/20/2015 1700   AST 27 03/20/2015 1700   ALT  15 03/20/2015 1700   ALKPHOS 51 03/20/2015 1700   BILITOT 0.8 03/20/2015 1700   GFRNONAA 30* 03/23/2015 0439   GFRAA 35* 03/23/2015 0439    No results found for: HGBA1C   Dg Chest 1 View  03/20/2015  CLINICAL DATA:  Found down today at nursing home. EXAM: CHEST  1 VIEW; DG HIP (WITH OR WITHOUT PELVIS) 2-3V RIGHT COMPARISON:  Chest x-ray 03/15/2015 FINDINGS: One view chest: The cardiac silhouette, mediastinal and hilar contours are within normal limits and stable. There is tortuosity of the thoracic aorta. Stable moderate-sized hiatal hernia. No acute pulmonary findings. The bony thorax is intact. Stable advanced degenerative changes involving both shoulders. Pelvis/right hip: There is a displaced intertrochanteric fracture of the right hip. Moderate varus deformity. The left hip is intact. The pubic symphysis and SI joints are intact. No definite pelvic fractures. IMPRESSION: 1. Displaced intertrochanteric fracture of the right hip. 2. No acute cardiopulmonary findings. Electronically Signed   By: Marijo Sanes M.D.   On: 03/20/2015 17:57   Ct Head Wo Contrast  03/20/2015  CLINICAL DATA:  [place] today, found lying on floor, denies loss of consciousness, mild confusion EXAM: CT HEAD WITHOUT CONTRAST CT CERVICAL SPINE WITHOUT CONTRAST TECHNIQUE: Multidetector CT imaging of the head and cervical spine was performed following the standard protocol without intravenous contrast. Multiplanar CT image reconstructions of the cervical spine were also generated. COMPARISON:  03/15/2015 FINDINGS: CT HEAD FINDINGS Severe diffuse atrophy. Encephalomalacia left occipital lobe. No evidence of acute infarct or mass. No hemorrhage or extra-axial fluid. No skull fracture. Visualized portions of the paranasal sinuses are clear. CT CERVICAL SPINE FINDINGS Stable grade 1 anterior listhesis of C4 on C5 stable degenerative disc disease throughout the cervical spine most severe at C5-6 and C6-7. Stable mild compression deformity  involving the superior endplate of T1. Stable large bilateral multinodular thyroid goiter. Unchanged measurements from 03/15/2015. IMPRESSION: No change when compared to recent prior study. No acute intracranial abnormalities. There is stable chronic involutional change and left occipital lobe encephalomalacia. Stable mild T1 compression deformity. Electronically Signed   By: Skipper Cliche M.D.   On: 03/20/2015 17:40   Ct Cervical Spine Wo Contrast  03/20/2015  CLINICAL DATA:  [place] today, found lying on floor, denies loss of consciousness, mild confusion EXAM: CT  HEAD WITHOUT CONTRAST CT CERVICAL SPINE WITHOUT CONTRAST TECHNIQUE: Multidetector CT imaging of the head and cervical spine was performed following the standard protocol without intravenous contrast. Multiplanar CT image reconstructions of the cervical spine were also generated. COMPARISON:  03/15/2015 FINDINGS: CT HEAD FINDINGS Severe diffuse atrophy. Encephalomalacia left occipital lobe. No evidence of acute infarct or mass. No hemorrhage or extra-axial fluid. No skull fracture. Visualized portions of the paranasal sinuses are clear. CT CERVICAL SPINE FINDINGS Stable grade 1 anterior listhesis of C4 on C5 stable degenerative disc disease throughout the cervical spine most severe at C5-6 and C6-7. Stable mild compression deformity involving the superior endplate of T1. Stable large bilateral multinodular thyroid goiter. Unchanged measurements from 03/15/2015. IMPRESSION: No change when compared to recent prior study. No acute intracranial abnormalities. There is stable chronic involutional change and left occipital lobe encephalomalacia. Stable mild T1 compression deformity. Electronically Signed   By: Skipper Cliche M.D.   On: 03/20/2015 17:40   Dg Hip Unilat With Pelvis 2-3 Views Right  03/20/2015  CLINICAL DATA:  Found down today at nursing home. EXAM: CHEST  1 VIEW; DG HIP (WITH OR WITHOUT PELVIS) 2-3V RIGHT COMPARISON:  Chest x-ray 03/15/2015  FINDINGS: One view chest: The cardiac silhouette, mediastinal and hilar contours are within normal limits and stable. There is tortuosity of the thoracic aorta. Stable moderate-sized hiatal hernia. No acute pulmonary findings. The bony thorax is intact. Stable advanced degenerative changes involving both shoulders. Pelvis/right hip: There is a displaced intertrochanteric fracture of the right hip. Moderate varus deformity. The left hip is intact. The pubic symphysis and SI joints are intact. No definite pelvic fractures. IMPRESSION: 1. Displaced intertrochanteric fracture of the right hip. 2. No acute cardiopulmonary findings. Electronically Signed   By: Marijo Sanes M.D.   On: 03/20/2015 17:57    Not all labs, radiology exams or other studies done during hospitalization come through on my EPIC note; however they are reviewed by me.    Assessment and Plan  #1-right leg edema-a venous Doppler has been ordered to rule out DVT-also will update a CBC tomorrow to recheck the hemoglobin-clinically she appears to be stable blood pressure and pulses stable she is in no distress continues to be very pleasant and interactive clinically appears to be doing well.  Addendum.  I did reassess patient during the venous Doppler study-preliminary results per speaking with the technician is negative for any DVT which is reassuring.  She does continue again on Eliquis.  Patient was lying in bed in appears actually edema improved with her lying in bed-I suspect there is dependent related etiology to this-will encourage leg elevation.  #2-history of renal insufficiency apparently her baseline creatinine was 1.7-it peaked at 1.8 nonhospital and reevaluate IV fluids with a creatinine of 1.45 on discharge lab on March 20 shows it is now 1.42-again fluids will have to be somewhat encouraged here but this appears relatively at her baseline.  #3 atrial fibrillation this appears rate controlled she is on Eliquis as well as  atenolol for rate control.  Again patient appears to be stable although I am somewhat concerned about the increased edema which was fairly remarkable but again appeared to have improved when her leg was elevated this will have to be watched also await updated lab work.  [id number] note greater than 35 minutes spent assessing patient-reassessing patient status post venous Doppler study-I did speak with the technician in the room as well-and reviewed her chart--labs as well as  discussion with nursing  about history of edema as well as chart review--of note greater than 50% of times and coordinating plan a care with activities as noted above      LASSEN, ARLO C,

## 2015-04-07 DIAGNOSIS — S72141D Displaced intertrochanteric fracture of right femur, subsequent encounter for closed fracture with routine healing: Secondary | ICD-10-CM | POA: Diagnosis not present

## 2015-04-08 ENCOUNTER — Encounter: Payer: Self-pay | Admitting: Family Medicine

## 2015-04-10 ENCOUNTER — Non-Acute Institutional Stay (SKILLED_NURSING_FACILITY): Payer: Medicare Other | Admitting: Internal Medicine

## 2015-04-10 DIAGNOSIS — D62 Acute posthemorrhagic anemia: Secondary | ICD-10-CM | POA: Diagnosis not present

## 2015-04-10 DIAGNOSIS — N289 Disorder of kidney and ureter, unspecified: Secondary | ICD-10-CM | POA: Diagnosis not present

## 2015-04-10 DIAGNOSIS — R300 Dysuria: Secondary | ICD-10-CM | POA: Insufficient documentation

## 2015-04-10 LAB — BASIC METABOLIC PANEL
BUN: 28 mg/dL — AB (ref 4–21)
CREATININE: 1.2 mg/dL — AB (ref 0.5–1.1)
Glucose: 85 mg/dL
Potassium: 4.9 mmol/L (ref 3.4–5.3)
SODIUM: 137 mmol/L (ref 137–147)

## 2015-04-10 LAB — CBC AND DIFFERENTIAL
HEMATOCRIT: 33 % — AB (ref 36–46)
Hemoglobin: 10.6 g/dL — AB (ref 12.0–16.0)
PLATELETS: 361 10*3/uL (ref 150–399)
WBC: 5.9 10^3/mL

## 2015-04-10 NOTE — Progress Notes (Signed)
Location:  [facility] Room Number: 111-P Place of Service:  SNF [phone]) Provider:    Tammi Sou, MD  Patient Care Team: Tammi Sou, MD as PCP - General (Family Medicine)  Extended Emergency Contact Information Primary Emergency Contact: [facility] Address: [address] [place], [place] [address] Johnnette Litter of [place] Phone: [phone] Relation: Son Secondary Emergency Contact: [place], [place] [address] Johnnette Litter of [place] Phone: [phone] Mobile Phone: [phone] Relation: Son  Code Status:  DNR Goals of care: Advanced Directive information Advanced Directives 04/10/2015  Does patient have an advance directive? Yes  Type of Advance Directive Out of facility DNR (pink MOST or yellow form);Healthcare Power of Attorney  Does patient want to make changes to advanced directive? No - Patient declined  Copy of advanced directive(s) in chart? Yes     Chief Complaint  Patient presents with  . Acute Visit   -Follow-up renal insufficiency Secondary to dysuria- HPI:  Pt is a 82 y.o. female seen today for an acute visit for complaints of dysuria-also follow-up renal insufficiency.  Patient says she has a little burning with urination although she states this has gotten somewhat better the last day or so.  There been no fever chills or complaints of backache.  She also has a history of mild renal insufficiency creatinine on lab done on March 30 is 1.2 this actually is an improvement  was up to 1.42 back on March 28-she was 1.15 on March 22 I suspect there will continue to be some variation apparently -- her by mouth intake is stable   Patient is here for rehabilitation after staining a right hip fracture that was surgically repaired.  She did have some increased right leg edema however Doppler was negative and this appears to be improving again leg elevation will have to be  encouraged.  She did require transfusion in the hospital for postop anemia hemoglobin has risen up to 10.3 had been 9.8 previously.  I note she was also treated for UTI in the hospital apparently she had some delirium but this appears to have resolved   Past Medical History  Diagnosis Date  . Osteoarthritis     wrists, hands  . Depression   . Hypertension   . Chronic atrial fibrillation ([place])   . Hypothyroidism   . Constipation   . Insomnia   . Hyperlipidemia   . Osteoporosis   . CVA (cerebral infarction) approx 2012    residual defect: poor R sided peripheral vision per son  . History of fracture of right hip 03/2015   Past Surgical History  Procedure Laterality Date  . Appendectomy    . Knee arthroscopy Right   . Thyroid surgery    . Colonoscopy  08/22/08  . Intramedullary (im) nail intertrochanteric Right 03/22/2015    Procedure: RIGHT femoral HIP NAILING;  Surgeon: Gaynelle Arabian, MD;  Location: [facility];  Service: Orthopedics;  Laterality: Right;    No Known Allergies    Medication List       This list is accurate as of: 04/10/15  2:11 PM.  Always use your most recent med list.               acetaminophen 325 MG tablet  Commonly known as:  TYLENOL  Take 2 tablets (650 mg total) by mouth every 6 (six) hours as needed for  mild pain, fever or headache (or Fever >/= 101).     atenolol 50 MG tablet  Commonly known as:  TENORMIN  Take 50 mg by mouth daily.     ELIQUIS 2.5 MG Tabs tablet  Generic drug:  apixaban  Take 2.5 mg by mouth 2 (two) times daily.     feeding supplement (ENSURE ENLIVE) Liqd  Take 237 mLs by mouth daily. Or facility alternative formulary     HYDROcodone-acetaminophen 5-325 MG tablet  Commonly known as:  NORCO/VICODIN  Take 1-2 tablets by mouth every 6 (six) hours as needed for moderate pain.     levothyroxine 75 MCG tablet  Commonly known as:  SYNTHROID, LEVOTHROID  Take 1 tablet (75 mcg total) by mouth daily.     pravastatin 40 MG  tablet  Commonly known as:  PRAVACHOL  Take 1 tablet (40 mg total) by mouth at bedtime.     senna-docusate 8.6-50 MG tablet  Commonly known as:  Senokot-S  Take 1 tablet by mouth at bedtime as needed for mild constipation.     traMADol 50 MG tablet  Commonly known as:  ULTRAM  Take 1-2 tablets (50-100 mg total) by mouth every 6 (six) hours as needed (mild pain).     Vitamin D-3 1000 units Caps  Take 3,000 Units by mouth daily.     zolpidem 5 MG tablet  Commonly known as:  AMBIEN  Take 1 tablet (5 mg total) by mouth daily.        Review of Systems   In general no complaints of fever or chills.  Skin does not complain of rashes itching or diaphoresis.  Respiratory no complaints cough or shortness of breath.  Cardiac no chest pain edema lower extremity appears to be stabilized.  GI does not complain of nausea vomiting diarrhea constipation.  GU again has had occasional complaints of dysuria this appears to be intermittent.  Muscle skeletal is not complaining of back pain or joint discomfort at this time hip pain appears to be controlled.  Neurologic does not complain of dizziness headache or syncope.  Psych is not complaining of depression or anxiety continues to be a very pleasant conversant lady  Immunization History  Administered Date(s) Administered  . Influenza Split 11/20/2012  . Pneumococcal Polysaccharide-23 08/22/2008  . Tdap 01/01/2015   Pertinent  Health Maintenance Due  Topic Date Due  . PNA vac Low Risk Adult (2 of 2 - PCV13) 08/22/2009  . INFLUENZA VACCINE  08/08/2015  . DEXA SCAN  Completed   No flowsheet data found. Functional Status Survey:    Filed Vitals:   04/10/15 1359  BP: 159/95  Pulse: 81  Temp: 98.3 F (36.8 C)  TempSrc: Oral  Resp: 20  Height: 5\' 4"  (1.626 m)  Weight: 119 lb (53.978 kg)  Of note update blood pressure is 139/88 Body mass index is 20.42 kg/(m^2). Physical Exam In general this is a pleasant elderly female in  no distress.  Her skin is warm and dry.  Oropharynx clear mucous membranes moist.  Chest is clear to auscultation there is no labored breathing.  Heart is regular irregular rate and rhythm without murmur gallop or rub edema appears to be stabilized on the right.  Abdomen is soft nontender positive bowel sounds.  GU could not really appreciate any overt suprapubic tenderness or CV Tenderness.  Musculoskeletal moves all extremities 4 limited right lower extremities secondary to recent hip repair-ambulating in a wheelchair today.  Neurologic is grossly intact no lateralizing  findings her speech is clear.  Psych she is grossly alert and oriented very pleasant and appropriate  Labs reviewed:  04/06/2015.  Sodium 137 potassium 4.9 BUN 28 creatinine 1.2.  WBC 5.9 hemoglobin 10.6 platelets 325  Recent Labs  03/21/15 1458 03/22/15 1054 03/23/15 0439 04/04/15  NA 140 138 137 135*  K 4.7 4.2 4.2 4.9  CL 107 108 107  --   CO2 23 20* 20*  --   GLUCOSE 114* 111* 150*  --   BUN 46* 47* 37* 28*  CREATININE 1.85* 1.82* 1.45* 1.4*  CALCIUM 7.9* 7.8* 7.7*  --     Recent Labs  02/20/15 1512 03/20/15 1700  AST 18 27  ALT 10 15  ALKPHOS 67 51  BILITOT 0.4 0.8  PROT 6.7 6.4*  ALBUMIN 3.8 3.5    Recent Labs  02/20/15 1512 03/15/15 1505 03/20/15 1700 03/21/15 1458 03/22/15 1054 03/23/15 0439 04/04/15  WBC 5.7 7.0 13.3* 11.2* 12.3* 8.1 7.4  NEUTROABS 3.4 5.0 11.7*  --   --   --   --   HGB 11.1* 10.4* 8.6* 7.8* 10.2* 9.5* 10.3*  HCT 34.5* 32.8* 26.6* 24.2* 30.7* 29.1* 32*  MCV 90.3 91.1 88.4 90.0 87.5 89.5  --   PLT 242.0 264 243 215 PLATELET CLUMPS NOTED ON SMEAR, COUNT APPEARS ADEQUATE 188 412*   Lab Results  Component Value Date   TSH 0.183* 03/21/2015   No results found for: HGBA1C No results found for: CHOL, HDL, LDLCALC, LDLDIRECT, TRIG, CHOLHDL  Significant Diagnostic Results in last 30 days:  Dg Chest 1 View  03/20/2015  CLINICAL DATA:  Found down today  at nursing home. EXAM: CHEST  1 VIEW; DG HIP (WITH OR WITHOUT PELVIS) 2-3V RIGHT COMPARISON:  Chest x-ray 03/15/2015 FINDINGS: One view chest: The cardiac silhouette, mediastinal and hilar contours are within normal limits and stable. There is tortuosity of the thoracic aorta. Stable moderate-sized hiatal hernia. No acute pulmonary findings. The bony thorax is intact. Stable advanced degenerative changes involving both shoulders. Pelvis/right hip: There is a displaced intertrochanteric fracture of the right hip. Moderate varus deformity. The left hip is intact. The pubic symphysis and SI joints are intact. No definite pelvic fractures. IMPRESSION: 1. Displaced intertrochanteric fracture of the right hip. 2. No acute cardiopulmonary findings. Electronically Signed   By: Marijo Sanes M.D.   On: 03/20/2015 17:57   Dg Chest 2 View  03/15/2015  CLINICAL DATA:  Fall from the commode last evening, no specific chest pain noted. EXAM: CHEST  2 VIEW COMPARISON:  None. FINDINGS: Cardiac shadow is mildly enlarged. The lungs are clear. No pneumothorax is seen. No rib fractures are noted. Degenerative changes of both shoulder joints and thoracic spine are seen. IMPRESSION: No acute abnormality noted. Electronically Signed   By: Inez Catalina M.D.   On: 03/15/2015 16:01   Dg Pelvis 1-2 Views  03/15/2015  CLINICAL DATA:  Fall last night off toilet. EXAM: PELVIS - 1-2 VIEW COMPARISON:  None. FINDINGS: Mild symmetric degenerative changes in the hips. No acute bony abnormality. Specifically, no fracture, subluxation, or dislocation. Soft tissues are intact. IMPRESSION: No acute bony abnormality. Electronically Signed   By: Rolm Baptise M.D.   On: 03/15/2015 16:01   Ct Head Wo Contrast  03/20/2015  CLINICAL DATA:  [place] today, found lying on floor, denies loss of consciousness, mild confusion EXAM: CT HEAD WITHOUT CONTRAST CT CERVICAL SPINE WITHOUT CONTRAST TECHNIQUE: Multidetector CT imaging of the head and cervical spine was  performed  following the standard protocol without intravenous contrast. Multiplanar CT image reconstructions of the cervical spine were also generated. COMPARISON:  03/15/2015 FINDINGS: CT HEAD FINDINGS Severe diffuse atrophy. Encephalomalacia left occipital lobe. No evidence of acute infarct or mass. No hemorrhage or extra-axial fluid. No skull fracture. Visualized portions of the paranasal sinuses are clear. CT CERVICAL SPINE FINDINGS Stable grade 1 anterior listhesis of C4 on C5 stable degenerative disc disease throughout the cervical spine most severe at C5-6 and C6-7. Stable mild compression deformity involving the superior endplate of T1. Stable large bilateral multinodular thyroid goiter. Unchanged measurements from 03/15/2015. IMPRESSION: No change when compared to recent prior study. No acute intracranial abnormalities. There is stable chronic involutional change and left occipital lobe encephalomalacia. Stable mild T1 compression deformity. Electronically Signed   By: Skipper Cliche M.D.   On: 03/20/2015 17:40   Ct Head Wo Contrast  03/15/2015  CLINICAL DATA:  Status post fall, denies loss consciousness EXAM: CT HEAD WITHOUT CONTRAST CT CERVICAL SPINE WITHOUT CONTRAST TECHNIQUE: Multidetector CT imaging of the head and cervical spine was performed following the standard protocol without intravenous contrast. Multiplanar CT image reconstructions of the cervical spine were also generated. COMPARISON:  None. FINDINGS: CT HEAD FINDINGS There is no evidence of mass effect, midline shift, or extra-axial fluid collections. There is no evidence of a space-occupying lesion or intracranial hemorrhage. There is no evidence of a cortical-based area of acute infarction. There is an old right parietal lobe infarct with encephalomalacia. There is generalized cerebral atrophy. There is periventricular white matter low attenuation likely secondary to microangiopathy. The ventricles and sulci are appropriate for the  patient's age. The basal cisterns are patent. Visualized portions of the orbits are unremarkable. The visualized portions of the paranasal sinuses and mastoid air cells are unremarkable. Cerebrovascular atherosclerotic calcifications are noted. The osseous structures are unremarkable. CT CERVICAL SPINE FINDINGS The alignment is anatomic. There is a compression fracture of the T1 vertebral body with approximately 10% height loss. There is 4 mm of anterolisthesis of C4 on C5 secondary to facet disease. The prevertebral soft tissues are normal. The intraspinal soft tissues are not fully imaged on this examination due to poor soft tissue contrast, but there is no gross soft tissue abnormality. Degenerative disc disease with disc height loss at C5-6 and C6-7. There is osseous fusion of the posterior elements at C2-3. There is moderate bilateral facet arthropathy at C3-4. There is bilateral osseous fusion of the posterior elements at C4-5. There is bilateral facet arthropathy at C5-6, C6-7, C7-T1 and T1-2. There is a broad-based disc osteophyte complex at C6-7. The visualized portions of the lung apices demonstrate no focal abnormality. Severe enlargement of the right thyroid gland measuring 7 x 4.4 cm and rightward deviation of the trachea. Nodular in enlargement of the left thyroid gland measures 3.6 x 2.4 cm. IMPRESSION: 1. No acute intracranial pathology. 2. Compression fracture of the T1 vertebral body with approximately 10% height loss. 3. Large multinodular thyroid goiter. Electronically Signed   By: Kathreen Devoid   On: 03/15/2015 16:06   Ct Cervical Spine Wo Contrast  03/20/2015  CLINICAL DATA:  [place] today, found lying on floor, denies loss of consciousness, mild confusion EXAM: CT HEAD WITHOUT CONTRAST CT CERVICAL SPINE WITHOUT CONTRAST TECHNIQUE: Multidetector CT imaging of the head and cervical spine was performed following the standard protocol without intravenous contrast. Multiplanar CT image  reconstructions of the cervical spine were also generated. COMPARISON:  03/15/2015 FINDINGS: CT HEAD FINDINGS Severe diffuse atrophy.  Encephalomalacia left occipital lobe. No evidence of acute infarct or mass. No hemorrhage or extra-axial fluid. No skull fracture. Visualized portions of the paranasal sinuses are clear. CT CERVICAL SPINE FINDINGS Stable grade 1 anterior listhesis of C4 on C5 stable degenerative disc disease throughout the cervical spine most severe at C5-6 and C6-7. Stable mild compression deformity involving the superior endplate of T1. Stable large bilateral multinodular thyroid goiter. Unchanged measurements from 03/15/2015. IMPRESSION: No change when compared to recent prior study. No acute intracranial abnormalities. There is stable chronic involutional change and left occipital lobe encephalomalacia. Stable mild T1 compression deformity. Electronically Signed   By: Skipper Cliche M.D.   On: 03/20/2015 17:40   Ct Cervical Spine Wo Contrast  03/15/2015  CLINICAL DATA:  Status post fall, denies loss consciousness EXAM: CT HEAD WITHOUT CONTRAST CT CERVICAL SPINE WITHOUT CONTRAST TECHNIQUE: Multidetector CT imaging of the head and cervical spine was performed following the standard protocol without intravenous contrast. Multiplanar CT image reconstructions of the cervical spine were also generated. COMPARISON:  None. FINDINGS: CT HEAD FINDINGS There is no evidence of mass effect, midline shift, or extra-axial fluid collections. There is no evidence of a space-occupying lesion or intracranial hemorrhage. There is no evidence of a cortical-based area of acute infarction. There is an old right parietal lobe infarct with encephalomalacia. There is generalized cerebral atrophy. There is periventricular white matter low attenuation likely secondary to microangiopathy. The ventricles and sulci are appropriate for the patient's age. The basal cisterns are patent. Visualized portions of the orbits are  unremarkable. The visualized portions of the paranasal sinuses and mastoid air cells are unremarkable. Cerebrovascular atherosclerotic calcifications are noted. The osseous structures are unremarkable. CT CERVICAL SPINE FINDINGS The alignment is anatomic. There is a compression fracture of the T1 vertebral body with approximately 10% height loss. There is 4 mm of anterolisthesis of C4 on C5 secondary to facet disease. The prevertebral soft tissues are normal. The intraspinal soft tissues are not fully imaged on this examination due to poor soft tissue contrast, but there is no gross soft tissue abnormality. Degenerative disc disease with disc height loss at C5-6 and C6-7. There is osseous fusion of the posterior elements at C2-3. There is moderate bilateral facet arthropathy at C3-4. There is bilateral osseous fusion of the posterior elements at C4-5. There is bilateral facet arthropathy at C5-6, C6-7, C7-T1 and T1-2. There is a broad-based disc osteophyte complex at C6-7. The visualized portions of the lung apices demonstrate no focal abnormality. Severe enlargement of the right thyroid gland measuring 7 x 4.4 cm and rightward deviation of the trachea. Nodular in enlargement of the left thyroid gland measures 3.6 x 2.4 cm. IMPRESSION: 1. No acute intracranial pathology. 2. Compression fracture of the T1 vertebral body with approximately 10% height loss. 3. Large multinodular thyroid goiter. Electronically Signed   By: Kathreen Devoid   On: 03/15/2015 16:06   Dg C-arm 1-60 Min-no Report  03/22/2015  CLINICAL DATA: hip C-ARM 1-60 MINUTES Fluoroscopy was utilized by the requesting physician.  No radiographic interpretation.   Dg Hip Operative Unilat W Or W/o Pelvis Right  03/22/2015  CLINICAL DATA:  Intratrochanteric fracture EXAM: OPERATIVE RIGHT HIP WITH PELVIS COMPARISON:  None. FLUOROSCOPY TIME:  Radiation Exposure Index (as provided by the fluoroscopic device): 8.4 mGy If the device does not provide the  exposure index: Fluoroscopy Time:  32 seconds Number of Acquired Images:  3 FINDINGS: A short medullary rod and fixation screw are noted traversing the proximal  femur. Fracture fragments are in near anatomic alignment with the exception of the lesser trochanter fragment. It is mildly displaced. No other focal abnormality is seen. IMPRESSION: Status post ORIF of proximal right femoral fracture. Electronically Signed   By: Inez Catalina M.D.   On: 03/22/2015 18:43   Dg Hip Unilat With Pelvis 2-3 Views Right  03/20/2015  CLINICAL DATA:  Found down today at nursing home. EXAM: CHEST  1 VIEW; DG HIP (WITH OR WITHOUT PELVIS) 2-3V RIGHT COMPARISON:  Chest x-ray 03/15/2015 FINDINGS: One view chest: The cardiac silhouette, mediastinal and hilar contours are within normal limits and stable. There is tortuosity of the thoracic aorta. Stable moderate-sized hiatal hernia. No acute pulmonary findings. The bony thorax is intact. Stable advanced degenerative changes involving both shoulders. Pelvis/right hip: There is a displaced intertrochanteric fracture of the right hip. Moderate varus deformity. The left hip is intact. The pubic symphysis and SI joints are intact. No definite pelvic fractures. IMPRESSION: 1. Displaced intertrochanteric fracture of the right hip. 2. No acute cardiopulmonary findings. Electronically Signed   By: Marijo Sanes M.D.   On: 03/20/2015 17:57    Assessment/Plan    #1 dysuria-urinalysis and culture is pending she does not overtly appear septic certainly she did have a UTI in the hospital will await culture results but appears to be fairly asymptomatic although she still complains of some intermittent dysuria this will have to be watched her do note she had a low-grade temperature 99.5 at one point earlier this week.  #2 history renal insufficiency this appears to be stabilized with a creatinine of 1.2.  #3 postop anemia this appears to be stabilized and rising with a hemoglobin of 10.6 on  most recent lab.  Clinically she appears to be doing well will have to be monitored obviously.  [id number]

## 2015-04-14 ENCOUNTER — Non-Acute Institutional Stay (SKILLED_NURSING_FACILITY): Payer: Medicare Other | Admitting: Internal Medicine

## 2015-04-14 ENCOUNTER — Encounter: Payer: Self-pay | Admitting: Internal Medicine

## 2015-04-14 DIAGNOSIS — S72001D Fracture of unspecified part of neck of right femur, subsequent encounter for closed fracture with routine healing: Secondary | ICD-10-CM

## 2015-04-14 DIAGNOSIS — N390 Urinary tract infection, site not specified: Secondary | ICD-10-CM | POA: Diagnosis not present

## 2015-04-14 DIAGNOSIS — I482 Chronic atrial fibrillation, unspecified: Secondary | ICD-10-CM

## 2015-04-14 DIAGNOSIS — E559 Vitamin D deficiency, unspecified: Secondary | ICD-10-CM

## 2015-04-14 DIAGNOSIS — E785 Hyperlipidemia, unspecified: Secondary | ICD-10-CM | POA: Diagnosis not present

## 2015-04-14 DIAGNOSIS — E038 Other specified hypothyroidism: Secondary | ICD-10-CM

## 2015-04-14 DIAGNOSIS — E034 Atrophy of thyroid (acquired): Secondary | ICD-10-CM | POA: Diagnosis not present

## 2015-04-14 DIAGNOSIS — I10 Essential (primary) hypertension: Secondary | ICD-10-CM | POA: Diagnosis not present

## 2015-04-14 NOTE — Progress Notes (Signed)
MRN: [id number] Name: Marie Robertson  Sex: female Age: 82 y.o. DOB: 22-Mar-1920  [facility] #: [facility] Facility/Room:111 Level Of Care: SNF Provider: Inocencio Homes D Emergency Contacts: Extended Emergency Contact Information Primary Emergency Contact: [facility] Address: [address]          [place], [place] [address] [place] Phone: [phone] Relation: Son Secondary Emergency Contact: [place], [place] [address] [place] Phone: [phone] Mobile Phone: [phone] Relation: Son  Code Status:   Allergies: Review of patient's allergies indicates no known allergies.  Chief Complaint  Patient presents with  . Discharge Note    HPI: Patient is 82 y.o. female whoAfib on apixaban, hypothyroidism, dementia, and HTN who presented with hip pain after a fall. Pt was admitted to [facility] from 3/13-17 where her hip was repaired. Hospital course was complicated by anemia, requiring a transfusion,delirium and a UTI. Pt was admitted to SNF for OT/PT. Pt is now ready to be d/c to her ALF. Pt did develop a UTI at SNF and her last dose of antibiotic is Monday am, 4/10, the day of d/c.  Past Medical History  Diagnosis Date  . Osteoarthritis     wrists, hands  . Depression   . Hypertension   . Chronic atrial fibrillation ([place])   . Hypothyroidism   . Constipation   . Insomnia   . Hyperlipidemia   . Osteoporosis   . CVA (cerebral infarction) approx 2012    residual defect: poor R sided peripheral vision per son  . History of fracture of right hip 03/2015    Past Surgical History  Procedure Laterality Date  . Appendectomy    . Knee arthroscopy Right   . Thyroid surgery    . Colonoscopy  08/22/08  . Intramedullary (im) nail intertrochanteric Right 03/22/2015    Procedure: RIGHT femoral HIP NAILING;  Surgeon: Gaynelle Arabian, MD;  Location: [facility];  Service: Orthopedics;  Laterality: Right;      Medication List       This list  is accurate as of: 04/14/15  5:46 PM.  Always use your most recent med list.               acetaminophen 325 MG tablet  Commonly known as:  TYLENOL  Take 2 tablets (650 mg total) by mouth every 6 (six) hours as needed for mild pain, fever or headache (or Fever >/= 101).     atenolol 50 MG tablet  Commonly known as:  TENORMIN  Take 50 mg by mouth daily.     ELIQUIS 2.5 MG Tabs tablet  Generic drug:  apixaban  Take 2.5 mg by mouth 2 (two) times daily.     feeding supplement (ENSURE ENLIVE) Liqd  Take 237 mLs by mouth daily. Or facility alternative formulary     HYDROcodone-acetaminophen 5-325 MG tablet  Commonly known as:  NORCO/VICODIN  Take 1-2 tablets by mouth every 6 (six) hours as needed for moderate pain.     levothyroxine 75 MCG tablet  Commonly known as:  SYNTHROID, LEVOTHROID  Take 1 tablet (75 mcg total) by mouth daily.     pravastatin 40 MG tablet  Commonly known as:  PRAVACHOL  Take 1 tablet (40 mg total) by mouth at bedtime.     senna-docusate 8.6-50 MG tablet  Commonly known as:  Senokot-S  Take 1 tablet by mouth at bedtime as needed for mild constipation.  traMADol 50 MG tablet  Commonly known as:  ULTRAM  Take 1-2 tablets (50-100 mg total) by mouth every 6 (six) hours as needed (mild pain).     Vitamin D-3 1000 units Caps  Take 3,000 Units by mouth daily.     zolpidem 5 MG tablet  Commonly known as:  AMBIEN  Take 1 tablet (5 mg total) by mouth daily.        No orders of the defined types were placed in this encounter.    Immunization History  Administered Date(s) Administered  . Influenza Split 11/20/2012  . Pneumococcal Polysaccharide-23 08/22/2008  . Tdap 01/01/2015    Social History  Substance Use Topics  . Smoking status: Never Smoker   . Smokeless tobacco: Never Used  . Alcohol Use: No    Filed Vitals:   04/14/15 1736  BP: 128/86  Pulse: 93  Temp: 97.3 F (36.3 C)  Resp: 18    Physical Exam  GENERAL APPEARANCE: Alert,  conversant. No acute distress.  HEENT: Unremarkable. RESPIRATORY: Breathing is even, unlabored. Lung sounds are clear   CARDIOVASCULAR: Heart RRR no murmurs, rubs or gallops. No peripheral edema.  GASTROINTESTINAL: Abdomen is soft, non-tender, not distended w/ normal bowel sounds.  NEUROLOGIC: Cranial nerves 2-12 grossly intact. Moves all extremities  Patient Active Problem List   Diagnosis Date Noted  . Dysuria 04/10/2015  . Edema 04/06/2015  . Acute encephalopathy 03/29/2015  . Vitamin D deficiency 03/29/2015  . UTI (lower urinary tract infection) 03/21/2015  . Hip fracture ([place]) 03/20/2015  . Chronic atrial fibrillation ([place]) 02/20/2015  . Hyperlipidemia 02/20/2015  . Essential hypertension, benign 12/13/2012  . Cardiac arrhythmia 12/13/2012  . Urinary incontinence 12/13/2012  . Hypothyroidism 12/13/2012  . History of depression 12/11/2012    CBC    Component Value Date/Time   WBC 7.4 04/04/2015   WBC 8.1 03/23/2015 0439   RBC 3.25* 03/23/2015 0439   HGB 10.3* 04/04/2015   HCT 32* 04/04/2015   PLT 412* 04/04/2015   MCV 89.5 03/23/2015 0439   LYMPHSABS 0.6* 03/20/2015 1700   MONOABS 0.9 03/20/2015 1700   EOSABS 0.0 03/20/2015 1700   BASOSABS 0.0 03/20/2015 1700    CMP     Component Value Date/Time   NA 135* 04/04/2015   NA 137 03/23/2015 0439   K 4.9 04/04/2015   CL 107 03/23/2015 0439   CO2 20* 03/23/2015 0439   GLUCOSE 150* 03/23/2015 0439   BUN 28* 04/04/2015   BUN 37* 03/23/2015 0439   CREATININE 1.4* 04/04/2015   CREATININE 1.45* 03/23/2015 0439   CALCIUM 7.7* 03/23/2015 0439   PROT 6.4* 03/20/2015 1700   ALBUMIN 3.5 03/20/2015 1700   AST 27 03/20/2015 1700   ALT 15 03/20/2015 1700   ALKPHOS 51 03/20/2015 1700   BILITOT 0.8 03/20/2015 1700   GFRNONAA 30* 03/23/2015 0439   GFRAA 35* 03/23/2015 0439    Assessment and Plan  Pt is d/c to [facility] with HH/OT/PT. Medications have been reconciled and rx's written.  [name],  MD

## 2015-04-15 NOTE — Progress Notes (Signed)
Patient ID: Marie Robertson, female   DOB: 09/20/1920, 82 y.o.   MRN: [id number]

## 2015-04-18 ENCOUNTER — Other Ambulatory Visit: Payer: Self-pay | Admitting: Family Medicine

## 2015-04-18 DIAGNOSIS — M6281 Muscle weakness (generalized): Secondary | ICD-10-CM | POA: Diagnosis not present

## 2015-04-18 DIAGNOSIS — R278 Other lack of coordination: Secondary | ICD-10-CM | POA: Diagnosis not present

## 2015-04-18 DIAGNOSIS — R262 Difficulty in walking, not elsewhere classified: Secondary | ICD-10-CM | POA: Diagnosis not present

## 2015-04-18 DIAGNOSIS — R296 Repeated falls: Secondary | ICD-10-CM | POA: Diagnosis not present

## 2015-04-18 DIAGNOSIS — R41841 Cognitive communication deficit: Secondary | ICD-10-CM | POA: Diagnosis not present

## 2015-04-18 DIAGNOSIS — R2681 Unsteadiness on feet: Secondary | ICD-10-CM | POA: Diagnosis not present

## 2015-04-18 DIAGNOSIS — Z4889 Encounter for other specified surgical aftercare: Secondary | ICD-10-CM | POA: Diagnosis not present

## 2015-04-18 MED ORDER — TRAMADOL HCL 50 MG PO TABS: ORAL_TABLET | ORAL | Status: DC

## 2015-04-19 DIAGNOSIS — R278 Other lack of coordination: Secondary | ICD-10-CM | POA: Diagnosis not present

## 2015-04-19 DIAGNOSIS — R2681 Unsteadiness on feet: Secondary | ICD-10-CM | POA: Diagnosis not present

## 2015-04-19 DIAGNOSIS — M6281 Muscle weakness (generalized): Secondary | ICD-10-CM | POA: Diagnosis not present

## 2015-04-19 DIAGNOSIS — R296 Repeated falls: Secondary | ICD-10-CM | POA: Diagnosis not present

## 2015-04-19 DIAGNOSIS — R41841 Cognitive communication deficit: Secondary | ICD-10-CM | POA: Diagnosis not present

## 2015-04-19 DIAGNOSIS — R262 Difficulty in walking, not elsewhere classified: Secondary | ICD-10-CM | POA: Diagnosis not present

## 2015-04-20 DIAGNOSIS — R278 Other lack of coordination: Secondary | ICD-10-CM | POA: Diagnosis not present

## 2015-04-20 DIAGNOSIS — R296 Repeated falls: Secondary | ICD-10-CM | POA: Diagnosis not present

## 2015-04-20 DIAGNOSIS — R262 Difficulty in walking, not elsewhere classified: Secondary | ICD-10-CM | POA: Diagnosis not present

## 2015-04-20 DIAGNOSIS — R41841 Cognitive communication deficit: Secondary | ICD-10-CM | POA: Diagnosis not present

## 2015-04-20 DIAGNOSIS — R2681 Unsteadiness on feet: Secondary | ICD-10-CM | POA: Diagnosis not present

## 2015-04-20 DIAGNOSIS — M6281 Muscle weakness (generalized): Secondary | ICD-10-CM | POA: Diagnosis not present

## 2015-04-21 DIAGNOSIS — R2681 Unsteadiness on feet: Secondary | ICD-10-CM | POA: Diagnosis not present

## 2015-04-21 DIAGNOSIS — R262 Difficulty in walking, not elsewhere classified: Secondary | ICD-10-CM | POA: Diagnosis not present

## 2015-04-21 DIAGNOSIS — M6281 Muscle weakness (generalized): Secondary | ICD-10-CM | POA: Diagnosis not present

## 2015-04-21 DIAGNOSIS — R278 Other lack of coordination: Secondary | ICD-10-CM | POA: Diagnosis not present

## 2015-04-21 DIAGNOSIS — R41841 Cognitive communication deficit: Secondary | ICD-10-CM | POA: Diagnosis not present

## 2015-04-21 DIAGNOSIS — R296 Repeated falls: Secondary | ICD-10-CM | POA: Diagnosis not present

## 2015-04-24 DIAGNOSIS — R2681 Unsteadiness on feet: Secondary | ICD-10-CM | POA: Diagnosis not present

## 2015-04-24 DIAGNOSIS — M6281 Muscle weakness (generalized): Secondary | ICD-10-CM | POA: Diagnosis not present

## 2015-04-24 DIAGNOSIS — R278 Other lack of coordination: Secondary | ICD-10-CM | POA: Diagnosis not present

## 2015-04-24 DIAGNOSIS — R262 Difficulty in walking, not elsewhere classified: Secondary | ICD-10-CM | POA: Diagnosis not present

## 2015-04-24 DIAGNOSIS — R296 Repeated falls: Secondary | ICD-10-CM | POA: Diagnosis not present

## 2015-04-24 DIAGNOSIS — R41841 Cognitive communication deficit: Secondary | ICD-10-CM | POA: Diagnosis not present

## 2015-04-25 DIAGNOSIS — M6281 Muscle weakness (generalized): Secondary | ICD-10-CM | POA: Diagnosis not present

## 2015-04-25 DIAGNOSIS — R278 Other lack of coordination: Secondary | ICD-10-CM | POA: Diagnosis not present

## 2015-04-25 DIAGNOSIS — R41841 Cognitive communication deficit: Secondary | ICD-10-CM | POA: Diagnosis not present

## 2015-04-25 DIAGNOSIS — R296 Repeated falls: Secondary | ICD-10-CM | POA: Diagnosis not present

## 2015-04-25 DIAGNOSIS — R262 Difficulty in walking, not elsewhere classified: Secondary | ICD-10-CM | POA: Diagnosis not present

## 2015-04-25 DIAGNOSIS — R2681 Unsteadiness on feet: Secondary | ICD-10-CM | POA: Diagnosis not present

## 2015-04-26 DIAGNOSIS — M6281 Muscle weakness (generalized): Secondary | ICD-10-CM | POA: Diagnosis not present

## 2015-04-26 DIAGNOSIS — R296 Repeated falls: Secondary | ICD-10-CM | POA: Diagnosis not present

## 2015-04-26 DIAGNOSIS — R41841 Cognitive communication deficit: Secondary | ICD-10-CM | POA: Diagnosis not present

## 2015-04-26 DIAGNOSIS — R278 Other lack of coordination: Secondary | ICD-10-CM | POA: Diagnosis not present

## 2015-04-26 DIAGNOSIS — R262 Difficulty in walking, not elsewhere classified: Secondary | ICD-10-CM | POA: Diagnosis not present

## 2015-04-26 DIAGNOSIS — R2681 Unsteadiness on feet: Secondary | ICD-10-CM | POA: Diagnosis not present

## 2015-04-27 DIAGNOSIS — R41841 Cognitive communication deficit: Secondary | ICD-10-CM | POA: Diagnosis not present

## 2015-04-27 DIAGNOSIS — M6281 Muscle weakness (generalized): Secondary | ICD-10-CM | POA: Diagnosis not present

## 2015-04-27 DIAGNOSIS — R296 Repeated falls: Secondary | ICD-10-CM | POA: Diagnosis not present

## 2015-04-27 DIAGNOSIS — R262 Difficulty in walking, not elsewhere classified: Secondary | ICD-10-CM | POA: Diagnosis not present

## 2015-04-27 DIAGNOSIS — R278 Other lack of coordination: Secondary | ICD-10-CM | POA: Diagnosis not present

## 2015-04-27 DIAGNOSIS — R2681 Unsteadiness on feet: Secondary | ICD-10-CM | POA: Diagnosis not present

## 2015-04-28 ENCOUNTER — Other Ambulatory Visit: Payer: Self-pay | Admitting: *Deleted

## 2015-04-28 DIAGNOSIS — R296 Repeated falls: Secondary | ICD-10-CM | POA: Diagnosis not present

## 2015-04-28 DIAGNOSIS — R262 Difficulty in walking, not elsewhere classified: Secondary | ICD-10-CM | POA: Diagnosis not present

## 2015-04-28 DIAGNOSIS — R278 Other lack of coordination: Secondary | ICD-10-CM | POA: Diagnosis not present

## 2015-04-28 DIAGNOSIS — R41841 Cognitive communication deficit: Secondary | ICD-10-CM | POA: Diagnosis not present

## 2015-04-28 DIAGNOSIS — R2681 Unsteadiness on feet: Secondary | ICD-10-CM | POA: Diagnosis not present

## 2015-04-28 DIAGNOSIS — M6281 Muscle weakness (generalized): Secondary | ICD-10-CM | POA: Diagnosis not present

## 2015-04-28 MED ORDER — HYDROCODONE-ACETAMINOPHEN 5-325 MG PO TABS: 1.0000 | ORAL_TABLET | Freq: Four times a day (QID) | ORAL | Status: DC | PRN

## 2015-04-28 NOTE — Telephone Encounter (Signed)
Erline Levine called requesting refill for pts hydrocodone but it looks like this was printed by Dr. Mariea Clonts. I advised Erline Levine to contact Dr. Mariea Clonts. She voiced understanding.

## 2015-04-28 NOTE — Telephone Encounter (Signed)
RX was printed in error by Medical Assistant, RX was discarded by shred bin.

## 2015-05-01 DIAGNOSIS — R278 Other lack of coordination: Secondary | ICD-10-CM | POA: Diagnosis not present

## 2015-05-01 DIAGNOSIS — M6281 Muscle weakness (generalized): Secondary | ICD-10-CM | POA: Diagnosis not present

## 2015-05-01 DIAGNOSIS — R2681 Unsteadiness on feet: Secondary | ICD-10-CM | POA: Diagnosis not present

## 2015-05-01 DIAGNOSIS — R296 Repeated falls: Secondary | ICD-10-CM | POA: Diagnosis not present

## 2015-05-01 DIAGNOSIS — R41841 Cognitive communication deficit: Secondary | ICD-10-CM | POA: Diagnosis not present

## 2015-05-01 DIAGNOSIS — R262 Difficulty in walking, not elsewhere classified: Secondary | ICD-10-CM | POA: Diagnosis not present

## 2015-05-02 ENCOUNTER — Ambulatory Visit: Payer: Medicare Other | Admitting: Audiology

## 2015-05-02 DIAGNOSIS — R262 Difficulty in walking, not elsewhere classified: Secondary | ICD-10-CM | POA: Diagnosis not present

## 2015-05-02 DIAGNOSIS — R2681 Unsteadiness on feet: Secondary | ICD-10-CM | POA: Diagnosis not present

## 2015-05-02 DIAGNOSIS — R41841 Cognitive communication deficit: Secondary | ICD-10-CM | POA: Diagnosis not present

## 2015-05-02 DIAGNOSIS — S72141D Displaced intertrochanteric fracture of right femur, subsequent encounter for closed fracture with routine healing: Secondary | ICD-10-CM | POA: Diagnosis not present

## 2015-05-02 DIAGNOSIS — M6281 Muscle weakness (generalized): Secondary | ICD-10-CM | POA: Diagnosis not present

## 2015-05-02 DIAGNOSIS — R296 Repeated falls: Secondary | ICD-10-CM | POA: Diagnosis not present

## 2015-05-02 DIAGNOSIS — R278 Other lack of coordination: Secondary | ICD-10-CM | POA: Diagnosis not present

## 2015-05-03 DIAGNOSIS — R296 Repeated falls: Secondary | ICD-10-CM | POA: Diagnosis not present

## 2015-05-03 DIAGNOSIS — R2681 Unsteadiness on feet: Secondary | ICD-10-CM | POA: Diagnosis not present

## 2015-05-03 DIAGNOSIS — M6281 Muscle weakness (generalized): Secondary | ICD-10-CM | POA: Diagnosis not present

## 2015-05-03 DIAGNOSIS — R262 Difficulty in walking, not elsewhere classified: Secondary | ICD-10-CM | POA: Diagnosis not present

## 2015-05-03 DIAGNOSIS — R278 Other lack of coordination: Secondary | ICD-10-CM | POA: Diagnosis not present

## 2015-05-03 DIAGNOSIS — R41841 Cognitive communication deficit: Secondary | ICD-10-CM | POA: Diagnosis not present

## 2015-05-04 DIAGNOSIS — R278 Other lack of coordination: Secondary | ICD-10-CM | POA: Diagnosis not present

## 2015-05-04 DIAGNOSIS — R2681 Unsteadiness on feet: Secondary | ICD-10-CM | POA: Diagnosis not present

## 2015-05-04 DIAGNOSIS — M6281 Muscle weakness (generalized): Secondary | ICD-10-CM | POA: Diagnosis not present

## 2015-05-04 DIAGNOSIS — R262 Difficulty in walking, not elsewhere classified: Secondary | ICD-10-CM | POA: Diagnosis not present

## 2015-05-04 DIAGNOSIS — R41841 Cognitive communication deficit: Secondary | ICD-10-CM | POA: Diagnosis not present

## 2015-05-04 DIAGNOSIS — R296 Repeated falls: Secondary | ICD-10-CM | POA: Diagnosis not present

## 2015-05-05 DIAGNOSIS — R41841 Cognitive communication deficit: Secondary | ICD-10-CM | POA: Diagnosis not present

## 2015-05-05 DIAGNOSIS — M6281 Muscle weakness (generalized): Secondary | ICD-10-CM | POA: Diagnosis not present

## 2015-05-05 DIAGNOSIS — R278 Other lack of coordination: Secondary | ICD-10-CM | POA: Diagnosis not present

## 2015-05-05 DIAGNOSIS — R296 Repeated falls: Secondary | ICD-10-CM | POA: Diagnosis not present

## 2015-05-05 DIAGNOSIS — R262 Difficulty in walking, not elsewhere classified: Secondary | ICD-10-CM | POA: Diagnosis not present

## 2015-05-05 DIAGNOSIS — R2681 Unsteadiness on feet: Secondary | ICD-10-CM | POA: Diagnosis not present

## 2015-05-08 DIAGNOSIS — R41841 Cognitive communication deficit: Secondary | ICD-10-CM | POA: Diagnosis not present

## 2015-05-08 DIAGNOSIS — R278 Other lack of coordination: Secondary | ICD-10-CM | POA: Diagnosis not present

## 2015-05-08 DIAGNOSIS — Z4889 Encounter for other specified surgical aftercare: Secondary | ICD-10-CM | POA: Diagnosis not present

## 2015-05-08 DIAGNOSIS — R2681 Unsteadiness on feet: Secondary | ICD-10-CM | POA: Diagnosis not present

## 2015-05-08 DIAGNOSIS — M6281 Muscle weakness (generalized): Secondary | ICD-10-CM | POA: Diagnosis not present

## 2015-05-08 DIAGNOSIS — R296 Repeated falls: Secondary | ICD-10-CM | POA: Diagnosis not present

## 2015-05-08 DIAGNOSIS — R262 Difficulty in walking, not elsewhere classified: Secondary | ICD-10-CM | POA: Diagnosis not present

## 2015-05-09 DIAGNOSIS — M6281 Muscle weakness (generalized): Secondary | ICD-10-CM | POA: Diagnosis not present

## 2015-05-09 DIAGNOSIS — R296 Repeated falls: Secondary | ICD-10-CM | POA: Diagnosis not present

## 2015-05-09 DIAGNOSIS — R278 Other lack of coordination: Secondary | ICD-10-CM | POA: Diagnosis not present

## 2015-05-09 DIAGNOSIS — R262 Difficulty in walking, not elsewhere classified: Secondary | ICD-10-CM | POA: Diagnosis not present

## 2015-05-09 DIAGNOSIS — R41841 Cognitive communication deficit: Secondary | ICD-10-CM | POA: Diagnosis not present

## 2015-05-09 DIAGNOSIS — R2681 Unsteadiness on feet: Secondary | ICD-10-CM | POA: Diagnosis not present

## 2015-05-10 DIAGNOSIS — R296 Repeated falls: Secondary | ICD-10-CM | POA: Diagnosis not present

## 2015-05-10 DIAGNOSIS — R278 Other lack of coordination: Secondary | ICD-10-CM | POA: Diagnosis not present

## 2015-05-10 DIAGNOSIS — R41841 Cognitive communication deficit: Secondary | ICD-10-CM | POA: Diagnosis not present

## 2015-05-10 DIAGNOSIS — M6281 Muscle weakness (generalized): Secondary | ICD-10-CM | POA: Diagnosis not present

## 2015-05-10 DIAGNOSIS — R262 Difficulty in walking, not elsewhere classified: Secondary | ICD-10-CM | POA: Diagnosis not present

## 2015-05-10 DIAGNOSIS — R2681 Unsteadiness on feet: Secondary | ICD-10-CM | POA: Diagnosis not present

## 2015-05-11 DIAGNOSIS — R262 Difficulty in walking, not elsewhere classified: Secondary | ICD-10-CM | POA: Diagnosis not present

## 2015-05-11 DIAGNOSIS — R296 Repeated falls: Secondary | ICD-10-CM | POA: Diagnosis not present

## 2015-05-11 DIAGNOSIS — R2681 Unsteadiness on feet: Secondary | ICD-10-CM | POA: Diagnosis not present

## 2015-05-11 DIAGNOSIS — M6281 Muscle weakness (generalized): Secondary | ICD-10-CM | POA: Diagnosis not present

## 2015-05-11 DIAGNOSIS — R278 Other lack of coordination: Secondary | ICD-10-CM | POA: Diagnosis not present

## 2015-05-11 DIAGNOSIS — R41841 Cognitive communication deficit: Secondary | ICD-10-CM | POA: Diagnosis not present

## 2015-05-12 DIAGNOSIS — R262 Difficulty in walking, not elsewhere classified: Secondary | ICD-10-CM | POA: Diagnosis not present

## 2015-05-12 DIAGNOSIS — R41841 Cognitive communication deficit: Secondary | ICD-10-CM | POA: Diagnosis not present

## 2015-05-12 DIAGNOSIS — M6281 Muscle weakness (generalized): Secondary | ICD-10-CM | POA: Diagnosis not present

## 2015-05-12 DIAGNOSIS — R278 Other lack of coordination: Secondary | ICD-10-CM | POA: Diagnosis not present

## 2015-05-12 DIAGNOSIS — R296 Repeated falls: Secondary | ICD-10-CM | POA: Diagnosis not present

## 2015-05-12 DIAGNOSIS — R2681 Unsteadiness on feet: Secondary | ICD-10-CM | POA: Diagnosis not present

## 2015-05-15 DIAGNOSIS — R2681 Unsteadiness on feet: Secondary | ICD-10-CM | POA: Diagnosis not present

## 2015-05-15 DIAGNOSIS — R262 Difficulty in walking, not elsewhere classified: Secondary | ICD-10-CM | POA: Diagnosis not present

## 2015-05-15 DIAGNOSIS — R296 Repeated falls: Secondary | ICD-10-CM | POA: Diagnosis not present

## 2015-05-15 DIAGNOSIS — R278 Other lack of coordination: Secondary | ICD-10-CM | POA: Diagnosis not present

## 2015-05-15 DIAGNOSIS — M6281 Muscle weakness (generalized): Secondary | ICD-10-CM | POA: Diagnosis not present

## 2015-05-15 DIAGNOSIS — R41841 Cognitive communication deficit: Secondary | ICD-10-CM | POA: Diagnosis not present

## 2015-05-16 DIAGNOSIS — R2681 Unsteadiness on feet: Secondary | ICD-10-CM | POA: Diagnosis not present

## 2015-05-16 DIAGNOSIS — R296 Repeated falls: Secondary | ICD-10-CM | POA: Diagnosis not present

## 2015-05-16 DIAGNOSIS — M6281 Muscle weakness (generalized): Secondary | ICD-10-CM | POA: Diagnosis not present

## 2015-05-16 DIAGNOSIS — R262 Difficulty in walking, not elsewhere classified: Secondary | ICD-10-CM | POA: Diagnosis not present

## 2015-05-16 DIAGNOSIS — R278 Other lack of coordination: Secondary | ICD-10-CM | POA: Diagnosis not present

## 2015-05-16 DIAGNOSIS — R41841 Cognitive communication deficit: Secondary | ICD-10-CM | POA: Diagnosis not present

## 2015-05-17 DIAGNOSIS — R278 Other lack of coordination: Secondary | ICD-10-CM | POA: Diagnosis not present

## 2015-05-17 DIAGNOSIS — R2681 Unsteadiness on feet: Secondary | ICD-10-CM | POA: Diagnosis not present

## 2015-05-17 DIAGNOSIS — R41841 Cognitive communication deficit: Secondary | ICD-10-CM | POA: Diagnosis not present

## 2015-05-17 DIAGNOSIS — R296 Repeated falls: Secondary | ICD-10-CM | POA: Diagnosis not present

## 2015-05-17 DIAGNOSIS — R262 Difficulty in walking, not elsewhere classified: Secondary | ICD-10-CM | POA: Diagnosis not present

## 2015-05-17 DIAGNOSIS — M6281 Muscle weakness (generalized): Secondary | ICD-10-CM | POA: Diagnosis not present

## 2015-05-18 DIAGNOSIS — R278 Other lack of coordination: Secondary | ICD-10-CM | POA: Diagnosis not present

## 2015-05-18 DIAGNOSIS — R2681 Unsteadiness on feet: Secondary | ICD-10-CM | POA: Diagnosis not present

## 2015-05-18 DIAGNOSIS — R41841 Cognitive communication deficit: Secondary | ICD-10-CM | POA: Diagnosis not present

## 2015-05-18 DIAGNOSIS — M6281 Muscle weakness (generalized): Secondary | ICD-10-CM | POA: Diagnosis not present

## 2015-05-18 DIAGNOSIS — R296 Repeated falls: Secondary | ICD-10-CM | POA: Diagnosis not present

## 2015-05-18 DIAGNOSIS — R262 Difficulty in walking, not elsewhere classified: Secondary | ICD-10-CM | POA: Diagnosis not present

## 2015-05-19 ENCOUNTER — Ambulatory Visit: Payer: Medicare Other | Admitting: Family Medicine

## 2015-05-19 DIAGNOSIS — R278 Other lack of coordination: Secondary | ICD-10-CM | POA: Diagnosis not present

## 2015-05-19 DIAGNOSIS — M6281 Muscle weakness (generalized): Secondary | ICD-10-CM | POA: Diagnosis not present

## 2015-05-19 DIAGNOSIS — R2681 Unsteadiness on feet: Secondary | ICD-10-CM | POA: Diagnosis not present

## 2015-05-19 DIAGNOSIS — R296 Repeated falls: Secondary | ICD-10-CM | POA: Diagnosis not present

## 2015-05-19 DIAGNOSIS — R41841 Cognitive communication deficit: Secondary | ICD-10-CM | POA: Diagnosis not present

## 2015-05-19 DIAGNOSIS — R262 Difficulty in walking, not elsewhere classified: Secondary | ICD-10-CM | POA: Diagnosis not present

## 2015-05-22 ENCOUNTER — Encounter: Payer: Self-pay | Admitting: Family Medicine

## 2015-05-22 ENCOUNTER — Ambulatory Visit (INDEPENDENT_AMBULATORY_CARE_PROVIDER_SITE_OTHER): Payer: Medicare Other | Admitting: Family Medicine

## 2015-05-22 VITALS — BP 149/104 | HR 73 | Temp 97.8°F | Resp 16 | Ht 61.0 in | Wt 118.5 lb

## 2015-05-22 DIAGNOSIS — N183 Chronic kidney disease, stage 3 unspecified: Secondary | ICD-10-CM

## 2015-05-22 DIAGNOSIS — E785 Hyperlipidemia, unspecified: Secondary | ICD-10-CM | POA: Diagnosis not present

## 2015-05-22 DIAGNOSIS — I482 Chronic atrial fibrillation, unspecified: Secondary | ICD-10-CM

## 2015-05-22 DIAGNOSIS — Z23 Encounter for immunization: Secondary | ICD-10-CM | POA: Diagnosis not present

## 2015-05-22 DIAGNOSIS — I10 Essential (primary) hypertension: Secondary | ICD-10-CM | POA: Diagnosis not present

## 2015-05-22 DIAGNOSIS — E039 Hypothyroidism, unspecified: Secondary | ICD-10-CM | POA: Diagnosis not present

## 2015-05-22 DIAGNOSIS — D649 Anemia, unspecified: Secondary | ICD-10-CM

## 2015-05-22 LAB — CBC WITH DIFFERENTIAL/PLATELET
BASOS PCT: 1.2 % (ref 0.0–3.0)
Basophils Absolute: 0.1 10*3/uL (ref 0.0–0.1)
EOS ABS: 0.1 10*3/uL (ref 0.0–0.7)
EOS PCT: 1.2 % (ref 0.0–5.0)
HCT: 36.1 % (ref 36.0–46.0)
HEMOGLOBIN: 11.9 g/dL — AB (ref 12.0–15.0)
LYMPHS ABS: 1.4 10*3/uL (ref 0.7–4.0)
Lymphocytes Relative: 24.2 % (ref 12.0–46.0)
MCHC: 32.9 g/dL (ref 30.0–36.0)
MCV: 90.6 fl (ref 78.0–100.0)
MONO ABS: 0.7 10*3/uL (ref 0.1–1.0)
Monocytes Relative: 12.7 % — ABNORMAL HIGH (ref 3.0–12.0)
NEUTROS PCT: 60.7 % (ref 43.0–77.0)
Neutro Abs: 3.6 10*3/uL (ref 1.4–7.7)
PLATELETS: 291 10*3/uL (ref 150.0–400.0)
RBC: 3.99 Mil/uL (ref 3.87–5.11)
RDW: 19.7 % — AB (ref 11.5–15.5)
WBC: 5.9 10*3/uL (ref 4.0–10.5)

## 2015-05-22 LAB — BASIC METABOLIC PANEL
BUN: 27 mg/dL — ABNORMAL HIGH (ref 6–23)
CALCIUM: 9.1 mg/dL (ref 8.4–10.5)
CO2: 30 meq/L (ref 19–32)
CREATININE: 1.26 mg/dL — AB (ref 0.40–1.20)
Chloride: 101 mEq/L (ref 96–112)
GFR: 42.11 mL/min — AB (ref >60.00)
Glucose, Bld: 90 mg/dL (ref 70–99)
Potassium: 4.9 mEq/L (ref 3.5–5.1)
SODIUM: 139 meq/L (ref 135–145)

## 2015-05-22 LAB — TSH: TSH: 0.13 u[IU]/mL — AB (ref 0.35–4.50)

## 2015-05-22 NOTE — Progress Notes (Signed)
OFFICE VISIT  05/22/2015   CC:  Chief Complaint  Patient presents with  . Follow-up    Pt is fasting.      HPI:    Patient is a 82 y.o. Caucasian female who presents for 3 mo f/u HTN, Hypothyroidism, HLD, and chronic a-fib. Here with son.  Lives at [facility].  Broke right hip 03/2015 and is now walking with a walker, ortho says she is recovering well. Taking all meds as rx'd.  Eating and drinking well.   Has no acute complaints.    ROS: no CP, no SOB, no palpitations, no HAs, no focal weakness, no myalgias, no nose bleeds, non blood in urine or stool.     Past Medical History  Diagnosis Date  . Osteoarthritis     wrists, hands  . Depression   . Hypertension   . Chronic atrial fibrillation ([place])   . Hypothyroidism   . Constipation   . Insomnia   . Hyperlipidemia   . Osteoporosis   . CVA (cerebral infarction) approx 2012    residual defect: poor R sided peripheral vision per son  . History of fracture of right hip 03/2015  . Chronic renal insufficiency, stage III (moderate)     Past Surgical History  Procedure Laterality Date  . Appendectomy    . Knee arthroscopy Right   . Thyroid surgery    . Colonoscopy  08/22/08  . Intramedullary (im) nail intertrochanteric Right 03/22/2015    Procedure: RIGHT femoral HIP NAILING;  Surgeon: Gaynelle Arabian, MD;  Location: [facility];  Service: Orthopedics;  Laterality: Right;    Outpatient Prescriptions Prior to Visit  Medication Sig Dispense Refill  . atenolol (TENORMIN) 50 MG tablet Take 50 mg by mouth daily.    . Cholecalciferol (VITAMIN D-3) 1000 units CAPS Take 3,000 Units by mouth daily.     Marland Kitchen ELIQUIS 2.5 MG TABS tablet Take 2.5 mg by mouth 2 (two) times daily.  0  . HYDROcodone-acetaminophen (NORCO/VICODIN) 5-325 MG tablet Take 1-2 tablets by mouth every 6 (six) hours as needed for moderate pain. 80 tablet 0  . levothyroxine (SYNTHROID, LEVOTHROID) 75 MCG tablet Take 1 tablet (75 mcg total) by mouth daily. 30 tablet 3   . pravastatin (PRAVACHOL) 40 MG tablet Take 1 tablet (40 mg total) by mouth at bedtime. 30 tablet 1  . senna-docusate (SENOKOT-S) 8.6-50 MG tablet Take 1 tablet by mouth at bedtime as needed for mild constipation.    . traMADol (ULTRAM) 50 MG tablet 1 tab po q6h prn pain 60 tablet 5  . zolpidem (AMBIEN) 5 MG tablet Take 1 tablet (5 mg total) by mouth daily. (Patient taking differently: Take 5 mg by mouth at bedtime as needed for sleep. ) 30 tablet 5   No facility-administered medications prior to visit.    No Known Allergies  ROS As per HPI  PE: Blood pressure 149/104, pulse 73, temperature 97.8 F (36.6 C), temperature source Oral, resp. rate 16, height 5\' 1"  (1.549 m), weight 118 lb 8 oz (53.751 kg), last menstrual period 01/08/1972, SpO2 93 %. Gen: Alert, well appearing.  Patient is oriented to person, place, time, and situation. AFFECT: pleasant, lucid thought and speech. CV: irreg irreg rhythm, rate about 70, no m/r/g Chest is clear, no wheezing or rales. Normal symmetric air entry throughout both lung fields. No chest wall deformities or tenderness. EXT: no clubbing, cyanosis, or edema.   LABS:  Lab Results  Component Value Date   WBC 7.4 04/04/2015  HGB 10.3* 04/04/2015   HCT 32* 04/04/2015   MCV 89.5 03/23/2015   PLT 412* 04/04/2015     Chemistry      Component Value Date/Time   NA 135* 04/04/2015   NA 137 03/23/2015 0439   K 4.9 04/04/2015   CL 107 03/23/2015 0439   CO2 20* 03/23/2015 0439   BUN 28* 04/04/2015   BUN 37* 03/23/2015 0439   CREATININE 1.4* 04/04/2015   CREATININE 1.45* 03/23/2015 0439   GLU 169 04/04/2015      Component Value Date/Time   CALCIUM 7.7* 03/23/2015 0439   ALKPHOS 51 03/20/2015 1700   AST 27 03/20/2015 1700   ALT 15 03/20/2015 1700   BILITOT 0.8 03/20/2015 1700     Lab Results  Component Value Date   TSH 0.183* 03/21/2015   IMPRESSION AND PLAN:  1) Hypothyroidism: TSH 03/2015 was low.  I think she was taking both her  31mcg and 50 mcg tabs at that time. We changed her to just the 72mcg tab qd and we'll recheck TSH today.  2) HTN: The current medical regimen is effective;  continue present plan and medications.   3) Atrial fibrillation: rate controlled, anticoagulated.  No sign of complications.  4) Normocytic anemia: this was s/p her recent hip surgery. Repeat CBC today.  5) Hyperlipidemia: tolerating statin.  Consider recheck FLP at next f/u visit.  An After Visit Summary was printed and given to the patient.  FOLLOW UP: Return in about 3 months (around 08/22/2015) for routine chronic illness f/u (30 min).  Signed:  Crissie Sickles, MD           05/22/2015

## 2015-05-22 NOTE — Progress Notes (Signed)
Pre visit review using our clinic review tool, if applicable. No additional management support is needed unless otherwise documented below in the visit note. 

## 2015-05-22 NOTE — Addendum Note (Signed)
Addended by: Onalee Hua on: 05/22/2015 11:41 AM   Modules accepted: Orders

## 2015-05-23 DIAGNOSIS — R41841 Cognitive communication deficit: Secondary | ICD-10-CM | POA: Diagnosis not present

## 2015-05-23 DIAGNOSIS — R296 Repeated falls: Secondary | ICD-10-CM | POA: Diagnosis not present

## 2015-05-23 DIAGNOSIS — R262 Difficulty in walking, not elsewhere classified: Secondary | ICD-10-CM | POA: Diagnosis not present

## 2015-05-23 DIAGNOSIS — M6281 Muscle weakness (generalized): Secondary | ICD-10-CM | POA: Diagnosis not present

## 2015-05-23 DIAGNOSIS — R278 Other lack of coordination: Secondary | ICD-10-CM | POA: Diagnosis not present

## 2015-05-23 DIAGNOSIS — R2681 Unsteadiness on feet: Secondary | ICD-10-CM | POA: Diagnosis not present

## 2015-05-24 DIAGNOSIS — R2681 Unsteadiness on feet: Secondary | ICD-10-CM | POA: Diagnosis not present

## 2015-05-24 DIAGNOSIS — M6281 Muscle weakness (generalized): Secondary | ICD-10-CM | POA: Diagnosis not present

## 2015-05-24 DIAGNOSIS — R296 Repeated falls: Secondary | ICD-10-CM | POA: Diagnosis not present

## 2015-05-24 DIAGNOSIS — R262 Difficulty in walking, not elsewhere classified: Secondary | ICD-10-CM | POA: Diagnosis not present

## 2015-05-24 DIAGNOSIS — R278 Other lack of coordination: Secondary | ICD-10-CM | POA: Diagnosis not present

## 2015-05-24 DIAGNOSIS — R41841 Cognitive communication deficit: Secondary | ICD-10-CM | POA: Diagnosis not present

## 2015-05-25 DIAGNOSIS — M6281 Muscle weakness (generalized): Secondary | ICD-10-CM | POA: Diagnosis not present

## 2015-05-25 DIAGNOSIS — R278 Other lack of coordination: Secondary | ICD-10-CM | POA: Diagnosis not present

## 2015-05-25 DIAGNOSIS — R2681 Unsteadiness on feet: Secondary | ICD-10-CM | POA: Diagnosis not present

## 2015-05-25 DIAGNOSIS — R296 Repeated falls: Secondary | ICD-10-CM | POA: Diagnosis not present

## 2015-05-25 DIAGNOSIS — R41841 Cognitive communication deficit: Secondary | ICD-10-CM | POA: Diagnosis not present

## 2015-05-25 DIAGNOSIS — R262 Difficulty in walking, not elsewhere classified: Secondary | ICD-10-CM | POA: Diagnosis not present

## 2015-05-26 DIAGNOSIS — R296 Repeated falls: Secondary | ICD-10-CM | POA: Diagnosis not present

## 2015-05-26 DIAGNOSIS — R278 Other lack of coordination: Secondary | ICD-10-CM | POA: Diagnosis not present

## 2015-05-26 DIAGNOSIS — R262 Difficulty in walking, not elsewhere classified: Secondary | ICD-10-CM | POA: Diagnosis not present

## 2015-05-26 DIAGNOSIS — M6281 Muscle weakness (generalized): Secondary | ICD-10-CM | POA: Diagnosis not present

## 2015-05-26 DIAGNOSIS — R2681 Unsteadiness on feet: Secondary | ICD-10-CM | POA: Diagnosis not present

## 2015-05-26 DIAGNOSIS — R41841 Cognitive communication deficit: Secondary | ICD-10-CM | POA: Diagnosis not present

## 2015-05-29 DIAGNOSIS — R2681 Unsteadiness on feet: Secondary | ICD-10-CM | POA: Diagnosis not present

## 2015-05-29 DIAGNOSIS — R296 Repeated falls: Secondary | ICD-10-CM | POA: Diagnosis not present

## 2015-05-29 DIAGNOSIS — R262 Difficulty in walking, not elsewhere classified: Secondary | ICD-10-CM | POA: Diagnosis not present

## 2015-05-29 DIAGNOSIS — R278 Other lack of coordination: Secondary | ICD-10-CM | POA: Diagnosis not present

## 2015-05-29 DIAGNOSIS — M6281 Muscle weakness (generalized): Secondary | ICD-10-CM | POA: Diagnosis not present

## 2015-05-29 DIAGNOSIS — R41841 Cognitive communication deficit: Secondary | ICD-10-CM | POA: Diagnosis not present

## 2015-05-31 DIAGNOSIS — R2681 Unsteadiness on feet: Secondary | ICD-10-CM | POA: Diagnosis not present

## 2015-05-31 DIAGNOSIS — R41841 Cognitive communication deficit: Secondary | ICD-10-CM | POA: Diagnosis not present

## 2015-05-31 DIAGNOSIS — R278 Other lack of coordination: Secondary | ICD-10-CM | POA: Diagnosis not present

## 2015-05-31 DIAGNOSIS — R296 Repeated falls: Secondary | ICD-10-CM | POA: Diagnosis not present

## 2015-05-31 DIAGNOSIS — M6281 Muscle weakness (generalized): Secondary | ICD-10-CM | POA: Diagnosis not present

## 2015-05-31 DIAGNOSIS — R262 Difficulty in walking, not elsewhere classified: Secondary | ICD-10-CM | POA: Diagnosis not present

## 2015-06-01 ENCOUNTER — Other Ambulatory Visit: Payer: Self-pay | Admitting: Family Medicine

## 2015-06-01 DIAGNOSIS — R41841 Cognitive communication deficit: Secondary | ICD-10-CM | POA: Diagnosis not present

## 2015-06-01 DIAGNOSIS — R296 Repeated falls: Secondary | ICD-10-CM | POA: Diagnosis not present

## 2015-06-01 DIAGNOSIS — R2681 Unsteadiness on feet: Secondary | ICD-10-CM | POA: Diagnosis not present

## 2015-06-01 DIAGNOSIS — R262 Difficulty in walking, not elsewhere classified: Secondary | ICD-10-CM | POA: Diagnosis not present

## 2015-06-01 DIAGNOSIS — M6281 Muscle weakness (generalized): Secondary | ICD-10-CM | POA: Diagnosis not present

## 2015-06-01 DIAGNOSIS — R278 Other lack of coordination: Secondary | ICD-10-CM | POA: Diagnosis not present

## 2015-06-01 MED ORDER — ZOLPIDEM TARTRATE 5 MG PO TABS: 5.0000 mg | ORAL_TABLET | Freq: Every day | ORAL | Status: DC

## 2015-06-02 DIAGNOSIS — R278 Other lack of coordination: Secondary | ICD-10-CM | POA: Diagnosis not present

## 2015-06-02 DIAGNOSIS — R2681 Unsteadiness on feet: Secondary | ICD-10-CM | POA: Diagnosis not present

## 2015-06-02 DIAGNOSIS — R41841 Cognitive communication deficit: Secondary | ICD-10-CM | POA: Diagnosis not present

## 2015-06-02 DIAGNOSIS — R262 Difficulty in walking, not elsewhere classified: Secondary | ICD-10-CM | POA: Diagnosis not present

## 2015-06-02 DIAGNOSIS — M6281 Muscle weakness (generalized): Secondary | ICD-10-CM | POA: Diagnosis not present

## 2015-06-02 DIAGNOSIS — R296 Repeated falls: Secondary | ICD-10-CM | POA: Diagnosis not present

## 2015-06-04 ENCOUNTER — Emergency Department (HOSPITAL_BASED_OUTPATIENT_CLINIC_OR_DEPARTMENT_OTHER)
Admission: EM | Admit: 2015-06-04 | Discharge: 2015-06-04 | Disposition: A | Discharge status: Home / Self Care | Payer: Medicare Other | Attending: Emergency Medicine | Admitting: Emergency Medicine

## 2015-06-04 ENCOUNTER — Emergency Department (HOSPITAL_BASED_OUTPATIENT_CLINIC_OR_DEPARTMENT_OTHER): Payer: Medicare Other

## 2015-06-04 ENCOUNTER — Encounter (HOSPITAL_BASED_OUTPATIENT_CLINIC_OR_DEPARTMENT_OTHER): Payer: Self-pay

## 2015-06-04 DIAGNOSIS — N390 Urinary tract infection, site not specified: Secondary | ICD-10-CM | POA: Diagnosis not present

## 2015-06-04 DIAGNOSIS — I482 Chronic atrial fibrillation: Secondary | ICD-10-CM | POA: Diagnosis not present

## 2015-06-04 DIAGNOSIS — I129 Hypertensive chronic kidney disease with stage 1 through stage 4 chronic kidney disease, or unspecified chronic kidney disease: Secondary | ICD-10-CM | POA: Diagnosis not present

## 2015-06-04 DIAGNOSIS — F329 Major depressive disorder, single episode, unspecified: Secondary | ICD-10-CM | POA: Insufficient documentation

## 2015-06-04 DIAGNOSIS — I639 Cerebral infarction, unspecified: Secondary | ICD-10-CM | POA: Diagnosis not present

## 2015-06-04 DIAGNOSIS — R410 Disorientation, unspecified: Secondary | ICD-10-CM | POA: Diagnosis not present

## 2015-06-04 DIAGNOSIS — R4182 Altered mental status, unspecified: Secondary | ICD-10-CM | POA: Diagnosis present

## 2015-06-04 DIAGNOSIS — E785 Hyperlipidemia, unspecified: Secondary | ICD-10-CM | POA: Insufficient documentation

## 2015-06-04 DIAGNOSIS — M1990 Unspecified osteoarthritis, unspecified site: Secondary | ICD-10-CM | POA: Insufficient documentation

## 2015-06-04 DIAGNOSIS — N183 Chronic kidney disease, stage 3 (moderate): Secondary | ICD-10-CM | POA: Diagnosis not present

## 2015-06-04 DIAGNOSIS — Z79899 Other long term (current) drug therapy: Secondary | ICD-10-CM | POA: Insufficient documentation

## 2015-06-04 DIAGNOSIS — E039 Hypothyroidism, unspecified: Secondary | ICD-10-CM | POA: Insufficient documentation

## 2015-06-04 LAB — URINE MICROSCOPIC-ADD ON

## 2015-06-04 LAB — CBC
HEMATOCRIT: 36.4 % (ref 36.0–46.0)
HEMOGLOBIN: 11.8 g/dL — AB (ref 12.0–15.0)
MCH: 29.9 pg (ref 26.0–34.0)
MCHC: 32.4 g/dL (ref 30.0–36.0)
MCV: 92.4 fL (ref 78.0–100.0)
Platelets: 266 10*3/uL (ref 150–400)
RBC: 3.94 MIL/uL (ref 3.87–5.11)
RDW: 17.2 % — ABNORMAL HIGH (ref 11.5–15.5)
WBC: 5.4 10*3/uL (ref 4.0–10.5)

## 2015-06-04 LAB — BASIC METABOLIC PANEL
ANION GAP: 8 (ref 5–15)
BUN: 29 mg/dL — ABNORMAL HIGH (ref 6–20)
CALCIUM: 8.3 mg/dL — AB (ref 8.9–10.3)
CO2: 26 mmol/L (ref 22–32)
Chloride: 104 mmol/L (ref 101–111)
Creatinine, Ser: 1.25 mg/dL — ABNORMAL HIGH (ref 0.44–1.00)
GFR, EST AFRICAN AMERICAN: 42 mL/min — AB (ref >60)
GFR, EST NON AFRICAN AMERICAN: 36 mL/min — AB (ref >60)
GLUCOSE: 93 mg/dL (ref 65–99)
POTASSIUM: 4.3 mmol/L (ref 3.5–5.1)
Sodium: 138 mmol/L (ref 135–145)

## 2015-06-04 LAB — URINALYSIS, ROUTINE W REFLEX MICROSCOPIC
Bilirubin Urine: NEGATIVE
Glucose, UA: NEGATIVE mg/dL
HGB URINE DIPSTICK: NEGATIVE
Ketones, ur: NEGATIVE mg/dL
Nitrite: NEGATIVE
PH: 6 (ref 5.0–8.0)
Protein, ur: 30 mg/dL — AB
SPECIFIC GRAVITY, URINE: 1.016 (ref 1.005–1.030)

## 2015-06-04 MED ORDER — CEFTRIAXONE SODIUM 1 G IJ SOLR
INTRAMUSCULAR | Status: AC
  Filled 2015-06-04: qty 10

## 2015-06-04 MED ORDER — DEXTROSE 5 % IV SOLN
1.0000 g | Freq: Once | INTRAVENOUS | Status: AC
  Administered 2015-06-04: 1 g via INTRAVENOUS
  Filled 2015-06-04: qty 10

## 2015-06-04 MED ORDER — CEPHALEXIN 500 MG PO CAPS: 500.0000 mg | ORAL_CAPSULE | Freq: Two times a day (BID) | ORAL | Status: DC

## 2015-06-04 NOTE — ED Notes (Signed)
Delay in EKG due to pt needing to go to the bathroom first.

## 2015-06-04 NOTE — ED Notes (Signed)
PA at bedside.

## 2015-06-04 NOTE — ED Notes (Signed)
Son reports pt has some confusion x 3 days. Reports he thinks it's another UTI. Reports pt went to dinner tonight and thought it was breakfast. Pt with history of UTIs. Pt does normally have some dementia.

## 2015-06-04 NOTE — ED Provider Notes (Signed)
CSN: [id number]     Arrival date & time 06/04/15  1926 History   First MD Initiated Contact with Patient 06/04/15 1939     Chief Complaint  Patient presents with  . Altered Mental Status     (Consider location/radiation/quality/duration/timing/severity/associated sxs/prior Treatment) HPI   Patient is a 82 year old female past medical history of hypertension, chronic afib and CVA (currently on Eliquis) who presents to the ED accompanied by her son with complaint of altered mental status, onset this morning. Patient's son reports when he went to visit his mother in her assisted living facility she appeared to be slightly confused. He notes he was notified by the nursing facility that when the patient was at dinner this evening she was confused. He reports the patient didn't not know what time it was and thought she was eating breakfast. Patient reports that she felt confused at the time but denies any other pain or complaints. Son reports patient has a history of mild dementia but notes that her altered mental status is changed from her baseline. He reports patient has had similar symptoms in the past when she was diagnosed with UTI. He also notes a nursing facility reported the patient having a low-grade fever today. Patient denies fever, chills, headache, visual changes, lightheadedness, dizziness, cough, shortness of breath, chest pain, wheezing, abdominal pain, nausea, vomiting, diarrhea, urinary symptoms, numbness, tingling, weakness. Denies any recent falls.   Past Medical History  Diagnosis Date  . Osteoarthritis     wrists, hands  . Depression   . Hypertension   . Chronic atrial fibrillation ([place])   . Hypothyroidism   . Constipation   . Insomnia   . Hyperlipidemia   . Osteoporosis   . CVA (cerebral infarction) approx 2012    residual defect: poor R sided peripheral vision per son  . History of fracture of right hip 03/2015  . Chronic renal insufficiency, stage III (moderate)     Past Surgical History  Procedure Laterality Date  . Appendectomy    . Knee arthroscopy Right   . Thyroid surgery    . Colonoscopy  08/22/08  . Intramedullary (im) nail intertrochanteric Right 03/22/2015    Procedure: RIGHT femoral HIP NAILING;  Surgeon: Gaynelle Arabian, MD;  Location: [facility];  Service: Orthopedics;  Laterality: Right;   Family History  Problem Relation Age of Onset  . Stroke Mother   . Diabetes Mother   . Alcohol abuse Father    Social History  Substance Use Topics  . Smoking status: Never Smoker   . Smokeless tobacco: Never Used  . Alcohol Use: No   OB History    No data available     Review of Systems  Neurological:       AMS  All other systems reviewed and are negative.     Allergies  Review of patient's allergies indicates no known allergies.  Home Medications   Prior to Admission medications   Medication Sig Start Date End Date Taking? Authorizing Provider  atenolol (TENORMIN) 50 MG tablet Take 50 mg by mouth daily.    Historical Provider, MD  cephALEXin (KEFLEX) 500 MG capsule Take 1 capsule (500 mg total) by mouth 2 (two) times daily. 06/04/15   Nona Dell, PA-C  Cholecalciferol (VITAMIN D-3) 1000 units CAPS Take 3,000 Units by mouth daily.     Historical Provider, MD  ELIQUIS 2.5 MG TABS tablet Take 2.5 mg by mouth 2 (two) times daily. 01/24/15   Historical Provider, MD  HYDROcodone-acetaminophen (NORCO/VICODIN)  5-325 MG tablet Take 1-2 tablets by mouth every 6 (six) hours as needed for moderate pain. 04/28/15   Tiffany L Reed, DO  levothyroxine (SYNTHROID, LEVOTHROID) 75 MCG tablet Take 1 tablet (75 mcg total) by mouth daily. 02/22/15   Tammi Sou, MD  pravastatin (PRAVACHOL) 40 MG tablet Take 1 tablet (40 mg total) by mouth at bedtime. 02/23/15   Tammi Sou, MD  senna-docusate (SENOKOT-S) 8.6-50 MG tablet Take 1 tablet by mouth at bedtime as needed for mild constipation. 03/24/15   Albertine Patricia, MD  traMADol (ULTRAM)  50 MG tablet 1 tab po q6h prn pain 04/18/15   Tammi Sou, MD  zolpidem (AMBIEN) 5 MG tablet Take 1 tablet (5 mg total) by mouth at bedtime. 06/01/15   Tammi Sou, MD   BP 167/93 mmHg  Pulse 77  Temp(Src) 98.4 F (36.9 C) (Oral)  Resp 19  SpO2 97%  LMP 01/08/1972 Physical Exam  Constitutional: She is oriented to person, place, and time. She appears well-developed and well-nourished. No distress.  Elderly appearing female.  HENT:  Head: Normocephalic and atraumatic. Head is without raccoon's eyes, without Battle's sign, without abrasion, without contusion and without laceration.  Right Ear: Tympanic membrane normal.  Left Ear: Tympanic membrane normal.  Nose: Nose normal.  Mouth/Throat: Uvula is midline, oropharynx is clear and moist and mucous membranes are normal. No oropharyngeal exudate, posterior oropharyngeal edema, posterior oropharyngeal erythema or tonsillar abscesses.  Eyes: Conjunctivae and EOM are normal. Pupils are equal, round, and reactive to light. Right eye exhibits no discharge. Left eye exhibits no discharge. No scleral icterus.  Neck: Normal range of motion. Neck supple.  Cardiovascular: Normal rate, regular rhythm, normal heart sounds and intact distal pulses.   Pulmonary/Chest: Effort normal. No respiratory distress. She has wheezes (intermittent diffuse end expiratory wheezes noted). She has rales (mild rales noted in RLL). She exhibits no tenderness.  Abdominal: Soft. Bowel sounds are normal. She exhibits no distension and no mass. There is no tenderness. There is no rebound and no guarding.  Musculoskeletal: Normal range of motion. She exhibits no edema.  Lymphadenopathy:    She has no cervical adenopathy.  Neurological: She is alert and oriented to person, place, and time. She has normal strength. No cranial nerve deficit or sensory deficit. She displays a negative Romberg sign. Coordination normal.  Skin: Skin is warm and dry. She is not diaphoretic.   Nursing note and vitals reviewed.   ED Course  Procedures (including critical care time) Labs Review Labs Reviewed  URINALYSIS, ROUTINE W REFLEX MICROSCOPIC (NOT AT [facility]) - Abnormal; Notable for the following:    APPearance CLOUDY (*)    Protein, ur 30 (*)    Leukocytes, UA SMALL (*)    All other components within normal limits  BASIC METABOLIC PANEL - Abnormal; Notable for the following:    BUN 29 (*)    Creatinine, Ser 1.25 (*)    Calcium 8.3 (*)    GFR calc non Af Amer 36 (*)    GFR calc Af Amer 42 (*)    All other components within normal limits  CBC - Abnormal; Notable for the following:    Hemoglobin 11.8 (*)    RDW 17.2 (*)    All other components within normal limits  URINE MICROSCOPIC-ADD ON - Abnormal; Notable for the following:    Squamous Epithelial / LPF 6-30 (*)    Bacteria, UA MANY (*)    All other components within normal  limits  URINE CULTURE    Imaging Review Dg Chest 2 View  06/04/2015  CLINICAL DATA:  Confusion EXAM: CHEST  2 VIEW COMPARISON:  03/20/2015 FINDINGS: Cardiac shadow is stable. Thoracic aorta is tortuous but stable. No focal infiltrate or sizable effusion is seen. No bony abnormality is noted. IMPRESSION: No acute abnormality seen. Electronically Signed   By: Inez Catalina M.D.   On: 06/04/2015 20:59   I have personally reviewed and evaluated these images and lab results as part of my medical decision-making.   EKG Interpretation   Date/Time:  Sunday Jun 04 2015 20:29:15 EDT Ventricular Rate:  80 PR Interval:    QRS Duration: 148 QT Interval:  413 QTC Calculation: 476 R Axis:   102 Text Interpretation:  Atrial fibrillation RBBB and LPFB Baseline wander in  lead(s) V6 since last tracing no significant change Confirmed by BELFI   MD, MELANIE ([id number]) on 06/04/2015 8:52:02 PM      MDM   Final diagnoses:  UTI (lower urinary tract infection)    Patient presents with reported altered mental status that started today. Patient's son  reports increased confusion that he noticed this morning when he went to visit his mother at her assisted living facility. Facility reported to son the patient has had fever and confusion throughout the day. VSS, afebrile. Exam revealed mild intermittent wheezing and Rales noted in right lower lobe. Remaining exam unremarkable. Abdominal exam benign. No neuro deficits. Patient alert and oriented 3. Son reports patient is currently at her baseline mental status. Patient denies any pain or complaints at this time. EKG showed atrial fibrillation, right bundle branch block, unchanged. Chest x-ray negative. Labs appear to be unchanged and at patient's baseline values. UA consistent with UTI, urine culture sent. Patient given initial dose of IV Rocephin in the ED. Due to patient continuing to remain at baseline mental status on reevaluation, I feel patient is appropriate to be discharged home with outpatient antibiotics for UTI. Discussed results and plan for discharge with patient and son. Advised patient to follow up with her primary care provider this week.    Chesley Noon [place], [place] 06/05/15 0041  Malvin Johns, MD 06/07/15 [phone]

## 2015-06-04 NOTE — Discharge Instructions (Signed)
Take your medications as prescribed until you finish the complete prescription. Follow-up with your primary care provider within the next 4-5 days. Continue taking your home medications as prescribed. Please return to the Emergency Department if symptoms worsen or new onset of fever, altered mental status, abdominal pain, vomiting, pain or burning with urination, blood in urine, flank pain, weakness, decreased oral intake.

## 2015-06-05 ENCOUNTER — Emergency Department (HOSPITAL_COMMUNITY): Payer: Medicare Other

## 2015-06-05 ENCOUNTER — Emergency Department (HOSPITAL_COMMUNITY)
Admission: EM | Admit: 2015-06-05 | Discharge: 2015-06-06 | Disposition: A | Discharge status: Home / Self Care | Payer: Medicare Other | Attending: Emergency Medicine | Admitting: Emergency Medicine

## 2015-06-05 ENCOUNTER — Encounter (HOSPITAL_COMMUNITY): Payer: Self-pay | Admitting: Radiology

## 2015-06-05 DIAGNOSIS — Y92000 Kitchen of unspecified non-institutional (private) residence as  the place of occurrence of the external cause: Secondary | ICD-10-CM | POA: Insufficient documentation

## 2015-06-05 DIAGNOSIS — Z8673 Personal history of transient ischemic attack (TIA), and cerebral infarction without residual deficits: Secondary | ICD-10-CM | POA: Diagnosis not present

## 2015-06-05 DIAGNOSIS — W19XXXA Unspecified fall, initial encounter: Secondary | ICD-10-CM

## 2015-06-05 DIAGNOSIS — S3992XA Unspecified injury of lower back, initial encounter: Secondary | ICD-10-CM | POA: Diagnosis not present

## 2015-06-05 DIAGNOSIS — W1839XA Other fall on same level, initial encounter: Secondary | ICD-10-CM | POA: Diagnosis not present

## 2015-06-05 DIAGNOSIS — S51012A Laceration without foreign body of left elbow, initial encounter: Secondary | ICD-10-CM | POA: Insufficient documentation

## 2015-06-05 DIAGNOSIS — Y999 Unspecified external cause status: Secondary | ICD-10-CM | POA: Diagnosis not present

## 2015-06-05 DIAGNOSIS — I129 Hypertensive chronic kidney disease with stage 1 through stage 4 chronic kidney disease, or unspecified chronic kidney disease: Secondary | ICD-10-CM | POA: Insufficient documentation

## 2015-06-05 DIAGNOSIS — S81812A Laceration without foreign body, left lower leg, initial encounter: Secondary | ICD-10-CM | POA: Insufficient documentation

## 2015-06-05 DIAGNOSIS — N183 Chronic kidney disease, stage 3 (moderate): Secondary | ICD-10-CM | POA: Diagnosis not present

## 2015-06-05 DIAGNOSIS — Z79899 Other long term (current) drug therapy: Secondary | ICD-10-CM | POA: Insufficient documentation

## 2015-06-05 DIAGNOSIS — T148XXA Other injury of unspecified body region, initial encounter: Secondary | ICD-10-CM

## 2015-06-05 DIAGNOSIS — M545 Low back pain: Secondary | ICD-10-CM | POA: Diagnosis not present

## 2015-06-05 DIAGNOSIS — Z96659 Presence of unspecified artificial knee joint: Secondary | ICD-10-CM | POA: Insufficient documentation

## 2015-06-05 DIAGNOSIS — Z7901 Long term (current) use of anticoagulants: Secondary | ICD-10-CM | POA: Diagnosis not present

## 2015-06-05 DIAGNOSIS — S300XXA Contusion of lower back and pelvis, initial encounter: Secondary | ICD-10-CM | POA: Diagnosis not present

## 2015-06-05 DIAGNOSIS — S0990XA Unspecified injury of head, initial encounter: Secondary | ICD-10-CM | POA: Diagnosis not present

## 2015-06-05 DIAGNOSIS — Y939 Activity, unspecified: Secondary | ICD-10-CM | POA: Diagnosis not present

## 2015-06-05 DIAGNOSIS — T148 Other injury of unspecified body region: Secondary | ICD-10-CM | POA: Diagnosis not present

## 2015-06-05 DIAGNOSIS — S199XXA Unspecified injury of neck, initial encounter: Secondary | ICD-10-CM | POA: Diagnosis not present

## 2015-06-05 NOTE — ED Notes (Signed)
Patient transported to X-ray 

## 2015-06-05 NOTE — ED Provider Notes (Addendum)
CSN: [id number]     Arrival date & time 06/05/15  2128 History   First MD Initiated Contact with Patient 06/05/15 2148     Chief Complaint  Patient presents with  . Fall     (Consider location/radiation/quality/duration/timing/severity/associated sxs/prior Treatment) HPI Comments: 82yo F w/ PMH including A fib on Eliquis, CVA, CKD, HTN, HLD who p/w fall. Just prior to arrival, the patient was trying to eat something up in her kitchen when she lost her balance and fell backwards onto her bottom. She denies any loss of consciousness or striking her head. She reports pain in her lower back and on her bottom, feeling like she bruised her bottom. She had small skin tears on her left lower leg and left arm but she denies any significant extremity pain. She denies any chest, abdominal, or hip pain. She has had some neck soreness recently which is mildly worse after the fall. She denies any extremity numbness or weakness. Of note, she was seen last night and diagnosed w/ UTI; discharged home on abx.  Patient is a 82 y.o. female presenting with fall. The history is provided by the patient.  Fall    Past Medical History  Diagnosis Date  . Osteoarthritis     wrists, hands  . Depression   . Hypertension   . Chronic atrial fibrillation ([place])   . Hypothyroidism   . Constipation   . Insomnia   . Hyperlipidemia   . Osteoporosis   . CVA (cerebral infarction) approx 2012    residual defect: poor R sided peripheral vision per son  . History of fracture of right hip 03/2015  . Chronic renal insufficiency, stage III (moderate)    Past Surgical History  Procedure Laterality Date  . Appendectomy    . Knee arthroscopy Right   . Thyroid surgery    . Colonoscopy  08/22/08  . Intramedullary (im) nail intertrochanteric Right 03/22/2015    Procedure: RIGHT femoral HIP NAILING;  Surgeon: Gaynelle Arabian, MD;  Location: [facility];  Service: Orthopedics;  Laterality: Right;   Family History  Problem Relation Age  of Onset  . Stroke Mother   . Diabetes Mother   . Alcohol abuse Father    Social History  Substance Use Topics  . Smoking status: Never Smoker   . Smokeless tobacco: Never Used  . Alcohol Use: No   OB History    No data available     Review of Systems 10 Systems reviewed and are negative for acute change except as noted in the HPI.    Allergies  Review of patient's allergies indicates no known allergies.  Home Medications   Prior to Admission medications   Medication Sig Start Date End Date Taking? Authorizing Provider  atenolol (TENORMIN) 50 MG tablet Take 50 mg by mouth daily.    Historical Provider, MD  cephALEXin (KEFLEX) 500 MG capsule Take 1 capsule (500 mg total) by mouth 2 (two) times daily. 06/04/15   Nona Dell, PA-C  Cholecalciferol (VITAMIN D-3) 1000 units CAPS Take 3,000 Units by mouth daily.     Historical Provider, MD  ELIQUIS 2.5 MG TABS tablet Take 2.5 mg by mouth 2 (two) times daily. 01/24/15   Historical Provider, MD  HYDROcodone-acetaminophen (NORCO/VICODIN) 5-325 MG tablet Take 1-2 tablets by mouth every 6 (six) hours as needed for moderate pain. 04/28/15   Tiffany L Reed, DO  levothyroxine (SYNTHROID, LEVOTHROID) 75 MCG tablet Take 1 tablet (75 mcg total) by mouth daily. 02/22/15   Arnette Norris  H McGowen, MD  pravastatin (PRAVACHOL) 40 MG tablet Take 1 tablet (40 mg total) by mouth at bedtime. 02/23/15   Tammi Sou, MD  senna-docusate (SENOKOT-S) 8.6-50 MG tablet Take 1 tablet by mouth at bedtime as needed for mild constipation. 03/24/15   Albertine Patricia, MD  traMADol (ULTRAM) 50 MG tablet 1 tab po q6h prn pain 04/18/15   Tammi Sou, MD  zolpidem (AMBIEN) 5 MG tablet Take 1 tablet (5 mg total) by mouth at bedtime. 06/01/15   Tammi Sou, MD   BP 158/108 mmHg  Pulse 99  Temp(Src) 98.3 F (36.8 C) (Oral)  Resp 17  SpO2 94%  LMP 01/08/1972 Physical Exam  Constitutional: She is oriented to person, place, and time. She appears  well-developed and well-nourished. No distress.  HENT:  Head: Normocephalic and atraumatic.  Mouth/Throat: Oropharynx is clear and moist.  Moist mucous membranes  Eyes: Conjunctivae are normal. Pupils are equal, round, and reactive to light.  Neck: Normal range of motion. Neck supple.  Cardiovascular: An irregularly irregular rhythm present.  Murmur heard. Pulmonary/Chest: Effort normal and breath sounds normal. She exhibits no tenderness.  Abdominal: Soft. Bowel sounds are normal. She exhibits no distension. There is no tenderness.  Musculoskeletal: She exhibits no edema or tenderness.  No midline spinal tenderness or stepoff, no bruising of back or buttocks  Neurological: She is alert and oriented to person, place, and time. She exhibits normal muscle tone.  Fluent speech, normal sensation throughout  Skin: Skin is warm and dry.  Small skin tears anterior L lower leg and L elbow  Psychiatric: She has a normal mood and affect. Judgment normal.  Nursing note and vitals reviewed.   ED Course  Procedures (including critical care time) Labs Review Labs Reviewed - No data to display  Imaging Review Dg Chest 2 View  06/04/2015  CLINICAL DATA:  Confusion EXAM: CHEST  2 VIEW COMPARISON:  03/20/2015 FINDINGS: Cardiac shadow is stable. Thoracic aorta is tortuous but stable. No focal infiltrate or sizable effusion is seen. No bony abnormality is noted. IMPRESSION: No acute abnormality seen. Electronically Signed   By: Inez Catalina M.D.   On: 06/04/2015 20:59   Dg Lumbar Spine Complete  06/05/2015  CLINICAL DATA:  82 year old female with lumbar spine pain after fall in the kitchen this evening. EXAM: LUMBAR SPINE - COMPLETE 4+ VIEW COMPARISON:  None. FINDINGS: No evidence of acute fracture. Transverse process ease not well seen due to osteopenia and overlying bowel gas. There is advanced multilevel degenerative change throughout the lumbar spine. Disc space narrowing at L1-L2 and L2-L3.  Associated endplate spurs. There is retrolisthesis of L1 on L2 and L2 on L3 that appears degenerative. Multilevel facet arthropathy, pronounced in the lower lumbar spine. No compression deformity. Diffuse bony under mineralization. IMPRESSION: 1. Multilevel degenerative change throughout the lumbar spine without radiographic evidence of acute fracture. 2. Bony under mineralization. Electronically Signed   By: Jeb Levering M.D.   On: 06/05/2015 22:27   Ct Head Wo Contrast  06/05/2015  CLINICAL DATA:  Fall. Head injury. Anti coagulation. Patient lost balance in the kitchen and fell backwards onto wood floor. EXAM: CT HEAD WITHOUT CONTRAST CT CERVICAL SPINE WITHOUT CONTRAST TECHNIQUE: Multidetector CT imaging of the head and cervical spine was performed following the standard protocol without intravenous contrast. Multiplanar CT image reconstructions of the cervical spine were also generated. COMPARISON:  03/20/2015 FINDINGS: CT HEAD FINDINGS Remote left PCA infarct. Periventricular white matter and corona radiata hypodensities  favor chronic ischemic microvascular white matter disease. Brainstem and posterior fossa structures unremarkable. Stable hypodensity in the head of the right caudate nucleus compatible with remote lacunar infarct. No intracranial hemorrhage, mass lesion, or acute CVA. There is atherosclerotic calcification of the cavernous carotid arteries bilaterally. CT CERVICAL SPINE FINDINGS Similar appearance of 3 mm anterolisthesis the at C4-5. Fused bilateral C2-3 and C4-5 facets with severe degenerative arthropathy at C3-4. The small erosion along the posterior base of the odontoid, no change. 3 mm anterolisthesis at C7-T1, stable. T1 superior endplate compression fracture, potentially late subacute, not appreciably changed. Mild grade 1 anterolisthesis at T1-2 and T2-3. Spurring causes foraminal impingement on the right at C3-4 and C4-5. No new fracture or new subluxation. Masslike heterogeneous  enlargement of the right thyroid gland, 6.0 by 4.3 cm, with leftward deviation of the trachea. Degenerative spurring of the right TMJ. IMPRESSION: 1. No acute intracranial findings or new cervical spine findings. 2. Remote left PCA infarct. Chronic microvascular white matter disease. Remote lacunar infarct of the right caudate head. 3. Considerable cervical spondylosis and multilevel degenerative subluxations. 4. Masslike heterogeneous enlargement the right thyroid gland, 6.0 by 4.3 cm. This is similar to prior. There is also some prominent nodularity of the left thyroid lobe. If this has not been previously worked up then thyroid ultrasound may be warranted. 5. Late subacute superior endplate compression at T1, without progression. Electronically Signed   By: Van Clines M.D.   On: 06/05/2015 22:48   Ct Cervical Spine Wo Contrast  06/05/2015  CLINICAL DATA:  Fall. Head injury. Anti coagulation. Patient lost balance in the kitchen and fell backwards onto wood floor. EXAM: CT HEAD WITHOUT CONTRAST CT CERVICAL SPINE WITHOUT CONTRAST TECHNIQUE: Multidetector CT imaging of the head and cervical spine was performed following the standard protocol without intravenous contrast. Multiplanar CT image reconstructions of the cervical spine were also generated. COMPARISON:  03/20/2015 FINDINGS: CT HEAD FINDINGS Remote left PCA infarct. Periventricular white matter and corona radiata hypodensities favor chronic ischemic microvascular white matter disease. Brainstem and posterior fossa structures unremarkable. Stable hypodensity in the head of the right caudate nucleus compatible with remote lacunar infarct. No intracranial hemorrhage, mass lesion, or acute CVA. There is atherosclerotic calcification of the cavernous carotid arteries bilaterally. CT CERVICAL SPINE FINDINGS Similar appearance of 3 mm anterolisthesis the at C4-5. Fused bilateral C2-3 and C4-5 facets with severe degenerative arthropathy at C3-4. The small  erosion along the posterior base of the odontoid, no change. 3 mm anterolisthesis at C7-T1, stable. T1 superior endplate compression fracture, potentially late subacute, not appreciably changed. Mild grade 1 anterolisthesis at T1-2 and T2-3. Spurring causes foraminal impingement on the right at C3-4 and C4-5. No new fracture or new subluxation. Masslike heterogeneous enlargement of the right thyroid gland, 6.0 by 4.3 cm, with leftward deviation of the trachea. Degenerative spurring of the right TMJ. IMPRESSION: 1. No acute intracranial findings or new cervical spine findings. 2. Remote left PCA infarct. Chronic microvascular white matter disease. Remote lacunar infarct of the right caudate head. 3. Considerable cervical spondylosis and multilevel degenerative subluxations. 4. Masslike heterogeneous enlargement the right thyroid gland, 6.0 by 4.3 cm. This is similar to prior. There is also some prominent nodularity of the left thyroid lobe. If this has not been previously worked up then thyroid ultrasound may be warranted. 5. Late subacute superior endplate compression at T1, without progression. Electronically Signed   By: Van Clines M.D.   On: 06/05/2015 22:48    EKG Interpretation None  Medications  oxyCODONE (Oxy IR/ROXICODONE) immediate release tablet 2.5 mg (not administered)  acetaminophen (TYLENOL) tablet 1,000 mg (1,000 mg Oral Given 06/06/15 0024)    MDM   Final diagnoses:  Contusion of sacral region, initial encounter  Multiple skin tears    Pt p/w soreness of buttocks and lower back after a fall from standing onto her bottom tonight. She was well-appearing with reassuring vital signs at presentation. Neurovascularly intact and no complaints of significant pain. This physically, no extremity weakness or numbness, no saddle anesthesia. Obtained plain films of lumbar spine as well as CT of head and C-spine to rule out acute injury given her age and eliquis use. CTs negative for  acute injury, I did discuss incidental findings of thyroid gland. Lumbar plain films also negative. Given her well appearance, I feel she is safe for discharge. I discussed supportive care instructions including sitting on a pillow as well as follow-up with PCP if her pain is not improved in one week. Return precautions including any neurologic deficits reviewed. Discussed wound care for skin tears. Pt initally refused tylenol stating that her pain was mild but upon trying to ambulate, her pain in tailbone became more severe. Gave dose of tylenol and later re-attempted ambulation. She is able to ambulate but continues to complain of pain, now diffusely across her lower back involving both sides, suggestive of muscle spasm. She has full ROM of hips and continues to deny hip/pelvic pain. Gave small dose of oral narcotic and will re-attempt ambulation. I anticipate discharge after medication administration.    Sharlett Iles, MD 06/05/15 [place], MD 06/06/15 [phone]

## 2015-06-05 NOTE — ED Notes (Signed)
Pt brought in by EMS after  losing balance in the kitchen and falling backwards onto wood floor. From [facility] assisted living. Denies LOC. Pt has complaints of sacral pain and pain in the lower lumbar area. Skin tears to left leg and left arm. Pt is on Eliquis. No hematoma noted to head. Spinal cord assessment passed. Pain 2 on a scale of 0-10.BP 162/102, P-88, R-16, cbg119 Recently seen for UTI.

## 2015-06-05 NOTE — ED Notes (Signed)
Bed: [id number] Expected date:  Expected time:  Means of arrival:  Comments: 82 yo F  Fall

## 2015-06-06 ENCOUNTER — Telehealth: Payer: Self-pay | Admitting: Family Medicine

## 2015-06-06 DIAGNOSIS — M6281 Muscle weakness (generalized): Secondary | ICD-10-CM | POA: Diagnosis not present

## 2015-06-06 DIAGNOSIS — R278 Other lack of coordination: Secondary | ICD-10-CM | POA: Diagnosis not present

## 2015-06-06 DIAGNOSIS — R262 Difficulty in walking, not elsewhere classified: Secondary | ICD-10-CM | POA: Diagnosis not present

## 2015-06-06 DIAGNOSIS — S81812A Laceration without foreign body, left lower leg, initial encounter: Secondary | ICD-10-CM | POA: Diagnosis not present

## 2015-06-06 DIAGNOSIS — R41841 Cognitive communication deficit: Secondary | ICD-10-CM | POA: Diagnosis not present

## 2015-06-06 DIAGNOSIS — R296 Repeated falls: Secondary | ICD-10-CM | POA: Diagnosis not present

## 2015-06-06 DIAGNOSIS — R2681 Unsteadiness on feet: Secondary | ICD-10-CM | POA: Diagnosis not present

## 2015-06-06 LAB — URINE CULTURE

## 2015-06-06 MED ORDER — OXYCODONE HCL 5 MG PO TABS
2.5000 mg | ORAL_TABLET | Freq: Once | ORAL | Status: AC
  Administered 2015-06-06: 2.5 mg via ORAL
  Filled 2015-06-06: qty 1

## 2015-06-06 MED ORDER — ACETAMINOPHEN 500 MG PO TABS
1000.0000 mg | ORAL_TABLET | Freq: Once | ORAL | Status: AC
  Administered 2015-06-06: 1000 mg via ORAL
  Filled 2015-06-06: qty 2

## 2015-06-06 NOTE — Telephone Encounter (Signed)
Yes, a week of antibiotics should take care of a urinary infection.

## 2015-06-06 NOTE — Telephone Encounter (Signed)
Pts son advised and voiced understanding.

## 2015-06-06 NOTE — Telephone Encounter (Signed)
Please advise. Thanks.  

## 2015-06-06 NOTE — ED Notes (Signed)
Pt ambulated in hall with 2 assist. Pt having complaints of pain in lower back and sacrum during ambulation. Dr. Rex Kras notified.

## 2015-06-06 NOTE — ED Notes (Signed)
Skin tear to left leg noted with small amount of bloody drainage. Site cleaned and 4x4 applied.

## 2015-06-06 NOTE — Telephone Encounter (Signed)
Please call Mikki Santee back. He has taken his mom to the ER for the past two nights, first time for UTI, second a fall. She is so sore he hesitates bringing her in for an appt. She is on medication for the UTI for a week. Will that be enough medication to cure it? Please call.

## 2015-06-06 NOTE — ED Notes (Signed)
Pt assisted with ambulation after receiving tylenol for pain. Pt still has complaints in the lower back with ambulation. Dr. Rex Kras notified.

## 2015-06-07 DIAGNOSIS — R41841 Cognitive communication deficit: Secondary | ICD-10-CM | POA: Diagnosis not present

## 2015-06-07 DIAGNOSIS — R296 Repeated falls: Secondary | ICD-10-CM | POA: Diagnosis not present

## 2015-06-07 DIAGNOSIS — R2681 Unsteadiness on feet: Secondary | ICD-10-CM | POA: Diagnosis not present

## 2015-06-07 DIAGNOSIS — R262 Difficulty in walking, not elsewhere classified: Secondary | ICD-10-CM | POA: Diagnosis not present

## 2015-06-07 DIAGNOSIS — M6281 Muscle weakness (generalized): Secondary | ICD-10-CM | POA: Diagnosis not present

## 2015-06-07 DIAGNOSIS — R278 Other lack of coordination: Secondary | ICD-10-CM | POA: Diagnosis not present

## 2015-06-08 DIAGNOSIS — M6281 Muscle weakness (generalized): Secondary | ICD-10-CM | POA: Diagnosis not present

## 2015-06-08 DIAGNOSIS — R41841 Cognitive communication deficit: Secondary | ICD-10-CM | POA: Diagnosis not present

## 2015-06-08 DIAGNOSIS — R2681 Unsteadiness on feet: Secondary | ICD-10-CM | POA: Diagnosis not present

## 2015-06-08 DIAGNOSIS — Z4889 Encounter for other specified surgical aftercare: Secondary | ICD-10-CM | POA: Diagnosis not present

## 2015-06-08 DIAGNOSIS — R296 Repeated falls: Secondary | ICD-10-CM | POA: Diagnosis not present

## 2015-06-08 DIAGNOSIS — R262 Difficulty in walking, not elsewhere classified: Secondary | ICD-10-CM | POA: Diagnosis not present

## 2015-06-08 DIAGNOSIS — R278 Other lack of coordination: Secondary | ICD-10-CM | POA: Diagnosis not present

## 2015-06-09 ENCOUNTER — Other Ambulatory Visit: Payer: Self-pay | Admitting: *Deleted

## 2015-06-09 DIAGNOSIS — R296 Repeated falls: Secondary | ICD-10-CM | POA: Diagnosis not present

## 2015-06-09 DIAGNOSIS — M6281 Muscle weakness (generalized): Secondary | ICD-10-CM | POA: Diagnosis not present

## 2015-06-09 DIAGNOSIS — R2681 Unsteadiness on feet: Secondary | ICD-10-CM | POA: Diagnosis not present

## 2015-06-09 DIAGNOSIS — R41841 Cognitive communication deficit: Secondary | ICD-10-CM | POA: Diagnosis not present

## 2015-06-09 DIAGNOSIS — R278 Other lack of coordination: Secondary | ICD-10-CM | POA: Diagnosis not present

## 2015-06-09 DIAGNOSIS — R262 Difficulty in walking, not elsewhere classified: Secondary | ICD-10-CM | POA: Diagnosis not present

## 2015-06-09 MED ORDER — TRAMADOL HCL 50 MG PO TABS: ORAL_TABLET | ORAL | Status: DC

## 2015-06-09 NOTE — Telephone Encounter (Signed)
Fax from [place] at [facility] requesting refill for Tramadol. Need ASAP.   Please fax Rx to them at [phone].  RF request for tramadol LOV: 05/22/15 Next ov: 08/22/15 Last written: 04/18/15 #60 w/ 5RF  Please advise. Thanks.

## 2015-06-13 DIAGNOSIS — R262 Difficulty in walking, not elsewhere classified: Secondary | ICD-10-CM | POA: Diagnosis not present

## 2015-06-13 DIAGNOSIS — R41841 Cognitive communication deficit: Secondary | ICD-10-CM | POA: Diagnosis not present

## 2015-06-13 DIAGNOSIS — R2681 Unsteadiness on feet: Secondary | ICD-10-CM | POA: Diagnosis not present

## 2015-06-13 DIAGNOSIS — R296 Repeated falls: Secondary | ICD-10-CM | POA: Diagnosis not present

## 2015-06-13 DIAGNOSIS — R278 Other lack of coordination: Secondary | ICD-10-CM | POA: Diagnosis not present

## 2015-06-13 DIAGNOSIS — M6281 Muscle weakness (generalized): Secondary | ICD-10-CM | POA: Diagnosis not present

## 2015-06-13 NOTE — Telephone Encounter (Signed)
Rx faxed

## 2015-06-14 DIAGNOSIS — M6281 Muscle weakness (generalized): Secondary | ICD-10-CM | POA: Diagnosis not present

## 2015-06-14 DIAGNOSIS — R278 Other lack of coordination: Secondary | ICD-10-CM | POA: Diagnosis not present

## 2015-06-14 DIAGNOSIS — R296 Repeated falls: Secondary | ICD-10-CM | POA: Diagnosis not present

## 2015-06-14 DIAGNOSIS — R262 Difficulty in walking, not elsewhere classified: Secondary | ICD-10-CM | POA: Diagnosis not present

## 2015-06-14 DIAGNOSIS — R41841 Cognitive communication deficit: Secondary | ICD-10-CM | POA: Diagnosis not present

## 2015-06-14 DIAGNOSIS — R2681 Unsteadiness on feet: Secondary | ICD-10-CM | POA: Diagnosis not present

## 2015-06-15 DIAGNOSIS — R278 Other lack of coordination: Secondary | ICD-10-CM | POA: Diagnosis not present

## 2015-06-15 DIAGNOSIS — M6281 Muscle weakness (generalized): Secondary | ICD-10-CM | POA: Diagnosis not present

## 2015-06-15 DIAGNOSIS — R296 Repeated falls: Secondary | ICD-10-CM | POA: Diagnosis not present

## 2015-06-15 DIAGNOSIS — R41841 Cognitive communication deficit: Secondary | ICD-10-CM | POA: Diagnosis not present

## 2015-06-15 DIAGNOSIS — R2681 Unsteadiness on feet: Secondary | ICD-10-CM | POA: Diagnosis not present

## 2015-06-15 DIAGNOSIS — R262 Difficulty in walking, not elsewhere classified: Secondary | ICD-10-CM | POA: Diagnosis not present

## 2015-06-16 ENCOUNTER — Telehealth: Payer: Self-pay | Admitting: Family Medicine

## 2015-06-16 DIAGNOSIS — R2681 Unsteadiness on feet: Secondary | ICD-10-CM | POA: Diagnosis not present

## 2015-06-16 DIAGNOSIS — R278 Other lack of coordination: Secondary | ICD-10-CM | POA: Diagnosis not present

## 2015-06-16 DIAGNOSIS — R296 Repeated falls: Secondary | ICD-10-CM | POA: Diagnosis not present

## 2015-06-16 DIAGNOSIS — R41841 Cognitive communication deficit: Secondary | ICD-10-CM | POA: Diagnosis not present

## 2015-06-16 DIAGNOSIS — M6281 Muscle weakness (generalized): Secondary | ICD-10-CM | POA: Diagnosis not present

## 2015-06-16 DIAGNOSIS — R262 Difficulty in walking, not elsewhere classified: Secondary | ICD-10-CM | POA: Diagnosis not present

## 2015-06-16 NOTE — Telephone Encounter (Signed)
Patient's POA (Ralp Marsico) calling to report patient was recently diagnosed with an uti.  Patient seems to be extremely confused and he thinks it related to the uti.  He is requesting another round of antibiotics to treat uti.

## 2015-06-16 NOTE — Telephone Encounter (Signed)
Pt will need to be seen to evaluate her confusion and urine. Confusion can be a sign of multiple potential problems and should be addressed

## 2015-06-16 NOTE — Telephone Encounter (Signed)
Dr. Anitra Lauth Marie Robertson.  Marie Robertson was treated by ER on 06/04/15 and sent home on Keflex for UTI. Does Marie Robertson needs an apt? Please advise. Thanks.

## 2015-06-16 NOTE — Telephone Encounter (Signed)
Spoke to pts son and advised him of message below. I advised him that Dr. Anitra Lauth is out of the office until Tuesday next week and that Dr. Raoul Pitch did not have any apts for today. I recommended that he take pt to urgent care or ER if her confusion seemed to be bad or worsening. Pts son stated that he did not think her confusion warranted a visit to urgent care or ER and that he would rather wait til Monday or Tuesday to bring pt here to be evaluated. Apt made for 06/19/15 at 9:45am.

## 2015-06-19 ENCOUNTER — Ambulatory Visit (INDEPENDENT_AMBULATORY_CARE_PROVIDER_SITE_OTHER): Payer: Medicare Other | Admitting: Family Medicine

## 2015-06-19 ENCOUNTER — Encounter: Payer: Self-pay | Admitting: Family Medicine

## 2015-06-19 VITALS — BP 158/110 | HR 73 | Temp 97.3°F | Resp 18 | Wt 115.8 lb

## 2015-06-19 DIAGNOSIS — Z87440 Personal history of urinary (tract) infections: Secondary | ICD-10-CM | POA: Diagnosis not present

## 2015-06-19 DIAGNOSIS — R278 Other lack of coordination: Secondary | ICD-10-CM | POA: Diagnosis not present

## 2015-06-19 DIAGNOSIS — R410 Disorientation, unspecified: Secondary | ICD-10-CM | POA: Diagnosis not present

## 2015-06-19 DIAGNOSIS — R41841 Cognitive communication deficit: Secondary | ICD-10-CM | POA: Diagnosis not present

## 2015-06-19 DIAGNOSIS — R262 Difficulty in walking, not elsewhere classified: Secondary | ICD-10-CM | POA: Diagnosis not present

## 2015-06-19 DIAGNOSIS — R296 Repeated falls: Secondary | ICD-10-CM | POA: Diagnosis not present

## 2015-06-19 DIAGNOSIS — M6281 Muscle weakness (generalized): Secondary | ICD-10-CM | POA: Diagnosis not present

## 2015-06-19 DIAGNOSIS — R2681 Unsteadiness on feet: Secondary | ICD-10-CM | POA: Diagnosis not present

## 2015-06-19 LAB — POC URINALSYSI DIPSTICK (AUTOMATED)
BILIRUBIN UA: NEGATIVE
GLUCOSE UA: NEGATIVE
Ketones, UA: NEGATIVE
LEUKOCYTES UA: NEGATIVE
NITRITE UA: NEGATIVE
PH UA: 6
Protein, UA: 100
RBC UA: NEGATIVE
Spec Grav, UA: 1.015
UROBILINOGEN UA: 0.2

## 2015-06-19 NOTE — Progress Notes (Signed)
Patient ID: Marie Robertson, female   DOB: 02-01-1920, 82 y.o.   MRN: [id number]    Marie Robertson , 1920/12/01, 82 y.o., female MRN: [id number]  CC: confusion Subjective: pt present from ALF today after her son was worried his mother was becoming confused again. Patient had 2 ED visits over the last month, one consisted of a UTI diagnosis with confusion. Pt was treated with rocephin and keflex, and had complete resolution of symptoms. Urine culture grew multiple bacteria forms. Her son came to visit On Friday and felt she was confused again and called to make an appt in fear she was getting a UTI. Pt denies confusion, nausea, vomit, dysuria or abd pain. She has a good appetite, but admits she only drinks about a glass a water a day. She has more complaints about chronic low back pain today.   No Known Allergies Social History  Substance Use Topics  . Smoking status: Never Smoker   . Smokeless tobacco: Never Used  . Alcohol Use: No   Past Medical History  Diagnosis Date  . Osteoarthritis     wrists, hands  . Depression   . Hypertension   . Chronic atrial fibrillation ([place])   . Hypothyroidism   . Constipation   . Insomnia   . Hyperlipidemia   . Osteoporosis   . CVA (cerebral infarction) approx 2012    residual defect: poor R sided peripheral vision per son  . History of fracture of right hip 03/2015  . Chronic renal insufficiency, stage III (moderate)    Past Surgical History  Procedure Laterality Date  . Appendectomy    . Knee arthroscopy Right   . Thyroid surgery    . Colonoscopy  08/22/08  . Intramedullary (im) nail intertrochanteric Right 03/22/2015    Procedure: RIGHT femoral HIP NAILING;  Surgeon: Gaynelle Arabian, MD;  Location: [facility];  Service: Orthopedics;  Laterality: Right;   Family History  Problem Relation Age of Onset  . Stroke Mother   . Diabetes Mother   . Alcohol abuse Father      Medication List       This list is accurate as of: 06/19/15  1:51 PM.   Always use your most recent med list.               atenolol 50 MG tablet  Commonly known as:  TENORMIN  Take 50 mg by mouth daily.     ELIQUIS 2.5 MG Tabs tablet  Generic drug:  apixaban  Take 2.5 mg by mouth 2 (two) times daily.     HYDROcodone-acetaminophen 5-325 MG tablet  Commonly known as:  NORCO/VICODIN  Take 1-2 tablets by mouth every 6 (six) hours as needed for moderate pain.     levothyroxine 75 MCG tablet  Commonly known as:  SYNTHROID, LEVOTHROID  Take 1 tablet (75 mcg total) by mouth daily.     pravastatin 40 MG tablet  Commonly known as:  PRAVACHOL  Take 1 tablet (40 mg total) by mouth at bedtime.     senna-docusate 8.6-50 MG tablet  Commonly known as:  Senokot-S  Take 1 tablet by mouth at bedtime as needed for mild constipation.     traMADol 50 MG tablet  Commonly known as:  ULTRAM  1 tab po q6h prn pain     Vitamin D-3 1000 units Caps  Take 3,000 Units by mouth daily.     zolpidem 5 MG tablet  Commonly known as:  AMBIEN  Take 1 tablet (5 mg  total) by mouth at bedtime.         ROS: Negative, with the exception of above mentioned in HPI   Objective:  BP 158/110 mmHg  Pulse 73  Temp(Src) 97.3 F (36.3 C)  Resp 18  Wt 115 lb 12.8 oz (52.527 kg)  SpO2 94%  LMP 01/08/1972 Body mass index is 21.89 kg/(m^2). Gen: Afebrile. No acute distress. Nontoxic in appearance. Very pleasant female.  HENT: AT. . Mildly tacky mucous membranes.  Eyes:Pupils Equal Round Reactive to light, Extraocular movements intact,  Conjunctiva without redness, discharge or icterus. CV: RRR  Chest: CTAB, no wheeze or crackles.   Abd: Soft. NTND. BS present Skin: No rashes, purpura or petechiae.  Neuro: Alert. Oriented x3 MSK: No CVA tenderness Psych: Normal affect, dress and demeanor. Normal speech. Normal thought content and judgment. Urinalysis    Component Value Date/Time   COLORURINE YELLOW 06/04/2015 2027   APPEARANCEUR CLOUDY* 06/04/2015 2027   LABSPEC 1.016  06/04/2015 2027   PHURINE 6.0 06/04/2015 2027   GLUCOSEU NEGATIVE 06/04/2015 2027   HGBUR NEGATIVE 06/04/2015 2027   BILIRUBINUR negative 06/19/2015 1349   BILIRUBINUR NEGATIVE 06/04/2015 2027   KETONESUR NEGATIVE 06/04/2015 2027   PROTEINUR 100 06/19/2015 1349   PROTEINUR 30* 06/04/2015 2027   UROBILINOGEN 0.2 06/19/2015 1349   NITRITE negative 06/19/2015 1349   NITRITE NEGATIVE 06/04/2015 2027   LEUKOCYTESUR Negative 06/19/2015 1349       Assessment/Plan: Marie Robertson is a 82 y.o. female present for acute OV for  1. Recent urinary tract infection - POCT Urinalysis Dipstick (Automated)-->negative  2. Confusion - no confusion on exam today. Family member feels pt is at her normal baseline.  - discussed urine results with pt and granddaughter. Pt appears mildly dehydrated on exam with ketones in urine.  - encouraged pt to drink more water. She reports drinking less than a glass a day. Dehydration, even in the mildest forms can cause some mild confusion/decreased calrity. On exam today pt does not appear confused at all, quite pleasant and making jokes.  - Of note: pt has tramadol prescribed to her for her chronic pain, but it is PRN. I explained that means if she is in pain, she has to ask her nurse for the medication, it will not be dispensed to her unless she is in moderate pain. She was appreciative.  - F/U PRN   > 25 minutes spent with patient, >50% of time spent face to face counseling patient and coordinating care.  electronically signed by:  Howard Pouch, DO  [place]

## 2015-06-19 NOTE — Patient Instructions (Signed)

## 2015-06-20 DIAGNOSIS — R296 Repeated falls: Secondary | ICD-10-CM | POA: Diagnosis not present

## 2015-06-20 DIAGNOSIS — Z87440 Personal history of urinary (tract) infections: Secondary | ICD-10-CM | POA: Insufficient documentation

## 2015-06-20 DIAGNOSIS — R2681 Unsteadiness on feet: Secondary | ICD-10-CM | POA: Diagnosis not present

## 2015-06-20 DIAGNOSIS — R410 Disorientation, unspecified: Secondary | ICD-10-CM | POA: Insufficient documentation

## 2015-06-20 DIAGNOSIS — M6281 Muscle weakness (generalized): Secondary | ICD-10-CM | POA: Diagnosis not present

## 2015-06-20 DIAGNOSIS — R278 Other lack of coordination: Secondary | ICD-10-CM | POA: Diagnosis not present

## 2015-06-20 DIAGNOSIS — R41841 Cognitive communication deficit: Secondary | ICD-10-CM | POA: Diagnosis not present

## 2015-06-20 DIAGNOSIS — R262 Difficulty in walking, not elsewhere classified: Secondary | ICD-10-CM | POA: Diagnosis not present

## 2015-06-20 HISTORY — DX: Disorientation, unspecified: R41.0

## 2015-06-21 DIAGNOSIS — R2681 Unsteadiness on feet: Secondary | ICD-10-CM | POA: Diagnosis not present

## 2015-06-21 DIAGNOSIS — R262 Difficulty in walking, not elsewhere classified: Secondary | ICD-10-CM | POA: Diagnosis not present

## 2015-06-21 DIAGNOSIS — R41841 Cognitive communication deficit: Secondary | ICD-10-CM | POA: Diagnosis not present

## 2015-06-21 DIAGNOSIS — R296 Repeated falls: Secondary | ICD-10-CM | POA: Diagnosis not present

## 2015-06-21 DIAGNOSIS — R278 Other lack of coordination: Secondary | ICD-10-CM | POA: Diagnosis not present

## 2015-06-21 DIAGNOSIS — M6281 Muscle weakness (generalized): Secondary | ICD-10-CM | POA: Diagnosis not present

## 2015-06-22 DIAGNOSIS — M6281 Muscle weakness (generalized): Secondary | ICD-10-CM | POA: Diagnosis not present

## 2015-06-22 DIAGNOSIS — R262 Difficulty in walking, not elsewhere classified: Secondary | ICD-10-CM | POA: Diagnosis not present

## 2015-06-22 DIAGNOSIS — R41841 Cognitive communication deficit: Secondary | ICD-10-CM | POA: Diagnosis not present

## 2015-06-22 DIAGNOSIS — R296 Repeated falls: Secondary | ICD-10-CM | POA: Diagnosis not present

## 2015-06-22 DIAGNOSIS — R2681 Unsteadiness on feet: Secondary | ICD-10-CM | POA: Diagnosis not present

## 2015-06-22 DIAGNOSIS — R278 Other lack of coordination: Secondary | ICD-10-CM | POA: Diagnosis not present

## 2015-06-23 DIAGNOSIS — M6281 Muscle weakness (generalized): Secondary | ICD-10-CM | POA: Diagnosis not present

## 2015-06-23 DIAGNOSIS — R41841 Cognitive communication deficit: Secondary | ICD-10-CM | POA: Diagnosis not present

## 2015-06-23 DIAGNOSIS — R296 Repeated falls: Secondary | ICD-10-CM | POA: Diagnosis not present

## 2015-06-23 DIAGNOSIS — R2681 Unsteadiness on feet: Secondary | ICD-10-CM | POA: Diagnosis not present

## 2015-06-23 DIAGNOSIS — R262 Difficulty in walking, not elsewhere classified: Secondary | ICD-10-CM | POA: Diagnosis not present

## 2015-06-23 DIAGNOSIS — R278 Other lack of coordination: Secondary | ICD-10-CM | POA: Diagnosis not present

## 2015-06-26 DIAGNOSIS — R262 Difficulty in walking, not elsewhere classified: Secondary | ICD-10-CM | POA: Diagnosis not present

## 2015-06-26 DIAGNOSIS — R278 Other lack of coordination: Secondary | ICD-10-CM | POA: Diagnosis not present

## 2015-06-26 DIAGNOSIS — R2681 Unsteadiness on feet: Secondary | ICD-10-CM | POA: Diagnosis not present

## 2015-06-26 DIAGNOSIS — R296 Repeated falls: Secondary | ICD-10-CM | POA: Diagnosis not present

## 2015-06-26 DIAGNOSIS — M6281 Muscle weakness (generalized): Secondary | ICD-10-CM | POA: Diagnosis not present

## 2015-06-26 DIAGNOSIS — R41841 Cognitive communication deficit: Secondary | ICD-10-CM | POA: Diagnosis not present

## 2015-06-27 DIAGNOSIS — R296 Repeated falls: Secondary | ICD-10-CM | POA: Diagnosis not present

## 2015-06-27 DIAGNOSIS — R41841 Cognitive communication deficit: Secondary | ICD-10-CM | POA: Diagnosis not present

## 2015-06-27 DIAGNOSIS — M6281 Muscle weakness (generalized): Secondary | ICD-10-CM | POA: Diagnosis not present

## 2015-06-27 DIAGNOSIS — R278 Other lack of coordination: Secondary | ICD-10-CM | POA: Diagnosis not present

## 2015-06-27 DIAGNOSIS — R2681 Unsteadiness on feet: Secondary | ICD-10-CM | POA: Diagnosis not present

## 2015-06-27 DIAGNOSIS — R262 Difficulty in walking, not elsewhere classified: Secondary | ICD-10-CM | POA: Diagnosis not present

## 2015-06-28 DIAGNOSIS — R296 Repeated falls: Secondary | ICD-10-CM | POA: Diagnosis not present

## 2015-06-28 DIAGNOSIS — R262 Difficulty in walking, not elsewhere classified: Secondary | ICD-10-CM | POA: Diagnosis not present

## 2015-06-28 DIAGNOSIS — R2681 Unsteadiness on feet: Secondary | ICD-10-CM | POA: Diagnosis not present

## 2015-06-28 DIAGNOSIS — M6281 Muscle weakness (generalized): Secondary | ICD-10-CM | POA: Diagnosis not present

## 2015-06-28 DIAGNOSIS — R41841 Cognitive communication deficit: Secondary | ICD-10-CM | POA: Diagnosis not present

## 2015-06-28 DIAGNOSIS — R278 Other lack of coordination: Secondary | ICD-10-CM | POA: Diagnosis not present

## 2015-06-29 DIAGNOSIS — M6281 Muscle weakness (generalized): Secondary | ICD-10-CM | POA: Diagnosis not present

## 2015-06-29 DIAGNOSIS — R2681 Unsteadiness on feet: Secondary | ICD-10-CM | POA: Diagnosis not present

## 2015-06-29 DIAGNOSIS — R278 Other lack of coordination: Secondary | ICD-10-CM | POA: Diagnosis not present

## 2015-06-29 DIAGNOSIS — R262 Difficulty in walking, not elsewhere classified: Secondary | ICD-10-CM | POA: Diagnosis not present

## 2015-06-29 DIAGNOSIS — R41841 Cognitive communication deficit: Secondary | ICD-10-CM | POA: Diagnosis not present

## 2015-06-29 DIAGNOSIS — R296 Repeated falls: Secondary | ICD-10-CM | POA: Diagnosis not present

## 2015-06-30 DIAGNOSIS — R296 Repeated falls: Secondary | ICD-10-CM | POA: Diagnosis not present

## 2015-06-30 DIAGNOSIS — R278 Other lack of coordination: Secondary | ICD-10-CM | POA: Diagnosis not present

## 2015-06-30 DIAGNOSIS — R41841 Cognitive communication deficit: Secondary | ICD-10-CM | POA: Diagnosis not present

## 2015-06-30 DIAGNOSIS — R2681 Unsteadiness on feet: Secondary | ICD-10-CM | POA: Diagnosis not present

## 2015-06-30 DIAGNOSIS — R262 Difficulty in walking, not elsewhere classified: Secondary | ICD-10-CM | POA: Diagnosis not present

## 2015-06-30 DIAGNOSIS — M6281 Muscle weakness (generalized): Secondary | ICD-10-CM | POA: Diagnosis not present

## 2015-07-02 DIAGNOSIS — M6281 Muscle weakness (generalized): Secondary | ICD-10-CM | POA: Diagnosis not present

## 2015-07-02 DIAGNOSIS — R278 Other lack of coordination: Secondary | ICD-10-CM | POA: Diagnosis not present

## 2015-07-02 DIAGNOSIS — R262 Difficulty in walking, not elsewhere classified: Secondary | ICD-10-CM | POA: Diagnosis not present

## 2015-07-02 DIAGNOSIS — R2681 Unsteadiness on feet: Secondary | ICD-10-CM | POA: Diagnosis not present

## 2015-07-02 DIAGNOSIS — R41841 Cognitive communication deficit: Secondary | ICD-10-CM | POA: Diagnosis not present

## 2015-07-02 DIAGNOSIS — R296 Repeated falls: Secondary | ICD-10-CM | POA: Diagnosis not present

## 2015-07-03 DIAGNOSIS — R262 Difficulty in walking, not elsewhere classified: Secondary | ICD-10-CM | POA: Diagnosis not present

## 2015-07-03 DIAGNOSIS — R278 Other lack of coordination: Secondary | ICD-10-CM | POA: Diagnosis not present

## 2015-07-03 DIAGNOSIS — R296 Repeated falls: Secondary | ICD-10-CM | POA: Diagnosis not present

## 2015-07-03 DIAGNOSIS — R2681 Unsteadiness on feet: Secondary | ICD-10-CM | POA: Diagnosis not present

## 2015-07-03 DIAGNOSIS — R41841 Cognitive communication deficit: Secondary | ICD-10-CM | POA: Diagnosis not present

## 2015-07-03 DIAGNOSIS — M6281 Muscle weakness (generalized): Secondary | ICD-10-CM | POA: Diagnosis not present

## 2015-07-04 DIAGNOSIS — R41841 Cognitive communication deficit: Secondary | ICD-10-CM | POA: Diagnosis not present

## 2015-07-04 DIAGNOSIS — R278 Other lack of coordination: Secondary | ICD-10-CM | POA: Diagnosis not present

## 2015-07-04 DIAGNOSIS — M6281 Muscle weakness (generalized): Secondary | ICD-10-CM | POA: Diagnosis not present

## 2015-07-04 DIAGNOSIS — R296 Repeated falls: Secondary | ICD-10-CM | POA: Diagnosis not present

## 2015-07-04 DIAGNOSIS — R2681 Unsteadiness on feet: Secondary | ICD-10-CM | POA: Diagnosis not present

## 2015-07-04 DIAGNOSIS — R262 Difficulty in walking, not elsewhere classified: Secondary | ICD-10-CM | POA: Diagnosis not present

## 2015-07-05 DIAGNOSIS — R278 Other lack of coordination: Secondary | ICD-10-CM | POA: Diagnosis not present

## 2015-07-05 DIAGNOSIS — M6281 Muscle weakness (generalized): Secondary | ICD-10-CM | POA: Diagnosis not present

## 2015-07-05 DIAGNOSIS — R41841 Cognitive communication deficit: Secondary | ICD-10-CM | POA: Diagnosis not present

## 2015-07-05 DIAGNOSIS — R262 Difficulty in walking, not elsewhere classified: Secondary | ICD-10-CM | POA: Diagnosis not present

## 2015-07-05 DIAGNOSIS — R296 Repeated falls: Secondary | ICD-10-CM | POA: Diagnosis not present

## 2015-07-05 DIAGNOSIS — R2681 Unsteadiness on feet: Secondary | ICD-10-CM | POA: Diagnosis not present

## 2015-07-06 DIAGNOSIS — M6281 Muscle weakness (generalized): Secondary | ICD-10-CM | POA: Diagnosis not present

## 2015-07-06 DIAGNOSIS — R2681 Unsteadiness on feet: Secondary | ICD-10-CM | POA: Diagnosis not present

## 2015-07-06 DIAGNOSIS — R296 Repeated falls: Secondary | ICD-10-CM | POA: Diagnosis not present

## 2015-07-06 DIAGNOSIS — R262 Difficulty in walking, not elsewhere classified: Secondary | ICD-10-CM | POA: Diagnosis not present

## 2015-07-06 DIAGNOSIS — R41841 Cognitive communication deficit: Secondary | ICD-10-CM | POA: Diagnosis not present

## 2015-07-06 DIAGNOSIS — R278 Other lack of coordination: Secondary | ICD-10-CM | POA: Diagnosis not present

## 2015-07-07 DIAGNOSIS — R296 Repeated falls: Secondary | ICD-10-CM | POA: Diagnosis not present

## 2015-07-07 DIAGNOSIS — R41841 Cognitive communication deficit: Secondary | ICD-10-CM | POA: Diagnosis not present

## 2015-07-07 DIAGNOSIS — R2681 Unsteadiness on feet: Secondary | ICD-10-CM | POA: Diagnosis not present

## 2015-07-07 DIAGNOSIS — R278 Other lack of coordination: Secondary | ICD-10-CM | POA: Diagnosis not present

## 2015-07-07 DIAGNOSIS — R262 Difficulty in walking, not elsewhere classified: Secondary | ICD-10-CM | POA: Diagnosis not present

## 2015-07-07 DIAGNOSIS — M6281 Muscle weakness (generalized): Secondary | ICD-10-CM | POA: Diagnosis not present

## 2015-07-10 DIAGNOSIS — R41841 Cognitive communication deficit: Secondary | ICD-10-CM | POA: Diagnosis not present

## 2015-07-10 DIAGNOSIS — M6281 Muscle weakness (generalized): Secondary | ICD-10-CM | POA: Diagnosis not present

## 2015-07-10 DIAGNOSIS — Z4889 Encounter for other specified surgical aftercare: Secondary | ICD-10-CM | POA: Diagnosis not present

## 2015-07-10 DIAGNOSIS — R278 Other lack of coordination: Secondary | ICD-10-CM | POA: Diagnosis not present

## 2015-07-10 DIAGNOSIS — R2681 Unsteadiness on feet: Secondary | ICD-10-CM | POA: Diagnosis not present

## 2015-07-10 DIAGNOSIS — R262 Difficulty in walking, not elsewhere classified: Secondary | ICD-10-CM | POA: Diagnosis not present

## 2015-07-10 DIAGNOSIS — R296 Repeated falls: Secondary | ICD-10-CM | POA: Diagnosis not present

## 2015-07-12 DIAGNOSIS — R41841 Cognitive communication deficit: Secondary | ICD-10-CM | POA: Diagnosis not present

## 2015-07-12 DIAGNOSIS — R278 Other lack of coordination: Secondary | ICD-10-CM | POA: Diagnosis not present

## 2015-07-12 DIAGNOSIS — R2681 Unsteadiness on feet: Secondary | ICD-10-CM | POA: Diagnosis not present

## 2015-07-12 DIAGNOSIS — R262 Difficulty in walking, not elsewhere classified: Secondary | ICD-10-CM | POA: Diagnosis not present

## 2015-07-12 DIAGNOSIS — R296 Repeated falls: Secondary | ICD-10-CM | POA: Diagnosis not present

## 2015-07-12 DIAGNOSIS — M6281 Muscle weakness (generalized): Secondary | ICD-10-CM | POA: Diagnosis not present

## 2015-07-13 DIAGNOSIS — R2681 Unsteadiness on feet: Secondary | ICD-10-CM | POA: Diagnosis not present

## 2015-07-13 DIAGNOSIS — M6281 Muscle weakness (generalized): Secondary | ICD-10-CM | POA: Diagnosis not present

## 2015-07-13 DIAGNOSIS — R296 Repeated falls: Secondary | ICD-10-CM | POA: Diagnosis not present

## 2015-07-13 DIAGNOSIS — R278 Other lack of coordination: Secondary | ICD-10-CM | POA: Diagnosis not present

## 2015-07-13 DIAGNOSIS — R41841 Cognitive communication deficit: Secondary | ICD-10-CM | POA: Diagnosis not present

## 2015-07-13 DIAGNOSIS — R262 Difficulty in walking, not elsewhere classified: Secondary | ICD-10-CM | POA: Diagnosis not present

## 2015-07-17 DIAGNOSIS — R41841 Cognitive communication deficit: Secondary | ICD-10-CM | POA: Diagnosis not present

## 2015-07-17 DIAGNOSIS — R262 Difficulty in walking, not elsewhere classified: Secondary | ICD-10-CM | POA: Diagnosis not present

## 2015-07-17 DIAGNOSIS — R278 Other lack of coordination: Secondary | ICD-10-CM | POA: Diagnosis not present

## 2015-07-17 DIAGNOSIS — R296 Repeated falls: Secondary | ICD-10-CM | POA: Diagnosis not present

## 2015-07-17 DIAGNOSIS — M6281 Muscle weakness (generalized): Secondary | ICD-10-CM | POA: Diagnosis not present

## 2015-07-17 DIAGNOSIS — R2681 Unsteadiness on feet: Secondary | ICD-10-CM | POA: Diagnosis not present

## 2015-07-18 DIAGNOSIS — R278 Other lack of coordination: Secondary | ICD-10-CM | POA: Diagnosis not present

## 2015-07-18 DIAGNOSIS — M6281 Muscle weakness (generalized): Secondary | ICD-10-CM | POA: Diagnosis not present

## 2015-07-18 DIAGNOSIS — R2681 Unsteadiness on feet: Secondary | ICD-10-CM | POA: Diagnosis not present

## 2015-07-18 DIAGNOSIS — R296 Repeated falls: Secondary | ICD-10-CM | POA: Diagnosis not present

## 2015-07-18 DIAGNOSIS — R262 Difficulty in walking, not elsewhere classified: Secondary | ICD-10-CM | POA: Diagnosis not present

## 2015-07-18 DIAGNOSIS — R41841 Cognitive communication deficit: Secondary | ICD-10-CM | POA: Diagnosis not present

## 2015-07-19 DIAGNOSIS — M6281 Muscle weakness (generalized): Secondary | ICD-10-CM | POA: Diagnosis not present

## 2015-07-19 DIAGNOSIS — R262 Difficulty in walking, not elsewhere classified: Secondary | ICD-10-CM | POA: Diagnosis not present

## 2015-07-19 DIAGNOSIS — R278 Other lack of coordination: Secondary | ICD-10-CM | POA: Diagnosis not present

## 2015-07-19 DIAGNOSIS — R296 Repeated falls: Secondary | ICD-10-CM | POA: Diagnosis not present

## 2015-07-19 DIAGNOSIS — R2681 Unsteadiness on feet: Secondary | ICD-10-CM | POA: Diagnosis not present

## 2015-07-19 DIAGNOSIS — R41841 Cognitive communication deficit: Secondary | ICD-10-CM | POA: Diagnosis not present

## 2015-07-20 DIAGNOSIS — R41841 Cognitive communication deficit: Secondary | ICD-10-CM | POA: Diagnosis not present

## 2015-07-20 DIAGNOSIS — M6281 Muscle weakness (generalized): Secondary | ICD-10-CM | POA: Diagnosis not present

## 2015-07-20 DIAGNOSIS — R262 Difficulty in walking, not elsewhere classified: Secondary | ICD-10-CM | POA: Diagnosis not present

## 2015-07-20 DIAGNOSIS — R278 Other lack of coordination: Secondary | ICD-10-CM | POA: Diagnosis not present

## 2015-07-20 DIAGNOSIS — R296 Repeated falls: Secondary | ICD-10-CM | POA: Diagnosis not present

## 2015-07-20 DIAGNOSIS — R2681 Unsteadiness on feet: Secondary | ICD-10-CM | POA: Diagnosis not present

## 2015-07-21 DIAGNOSIS — R278 Other lack of coordination: Secondary | ICD-10-CM | POA: Diagnosis not present

## 2015-07-21 DIAGNOSIS — M6281 Muscle weakness (generalized): Secondary | ICD-10-CM | POA: Diagnosis not present

## 2015-07-21 DIAGNOSIS — R2681 Unsteadiness on feet: Secondary | ICD-10-CM | POA: Diagnosis not present

## 2015-07-21 DIAGNOSIS — R41841 Cognitive communication deficit: Secondary | ICD-10-CM | POA: Diagnosis not present

## 2015-07-21 DIAGNOSIS — R262 Difficulty in walking, not elsewhere classified: Secondary | ICD-10-CM | POA: Diagnosis not present

## 2015-07-21 DIAGNOSIS — R296 Repeated falls: Secondary | ICD-10-CM | POA: Diagnosis not present

## 2015-07-24 DIAGNOSIS — R41841 Cognitive communication deficit: Secondary | ICD-10-CM | POA: Diagnosis not present

## 2015-07-24 DIAGNOSIS — M6281 Muscle weakness (generalized): Secondary | ICD-10-CM | POA: Diagnosis not present

## 2015-07-24 DIAGNOSIS — R278 Other lack of coordination: Secondary | ICD-10-CM | POA: Diagnosis not present

## 2015-07-24 DIAGNOSIS — R262 Difficulty in walking, not elsewhere classified: Secondary | ICD-10-CM | POA: Diagnosis not present

## 2015-07-24 DIAGNOSIS — R296 Repeated falls: Secondary | ICD-10-CM | POA: Diagnosis not present

## 2015-07-24 DIAGNOSIS — R2681 Unsteadiness on feet: Secondary | ICD-10-CM | POA: Diagnosis not present

## 2015-07-25 DIAGNOSIS — R278 Other lack of coordination: Secondary | ICD-10-CM | POA: Diagnosis not present

## 2015-07-25 DIAGNOSIS — M6281 Muscle weakness (generalized): Secondary | ICD-10-CM | POA: Diagnosis not present

## 2015-07-25 DIAGNOSIS — R296 Repeated falls: Secondary | ICD-10-CM | POA: Diagnosis not present

## 2015-07-25 DIAGNOSIS — R2681 Unsteadiness on feet: Secondary | ICD-10-CM | POA: Diagnosis not present

## 2015-07-25 DIAGNOSIS — R41841 Cognitive communication deficit: Secondary | ICD-10-CM | POA: Diagnosis not present

## 2015-07-25 DIAGNOSIS — R262 Difficulty in walking, not elsewhere classified: Secondary | ICD-10-CM | POA: Diagnosis not present

## 2015-07-26 DIAGNOSIS — R296 Repeated falls: Secondary | ICD-10-CM | POA: Diagnosis not present

## 2015-07-26 DIAGNOSIS — R262 Difficulty in walking, not elsewhere classified: Secondary | ICD-10-CM | POA: Diagnosis not present

## 2015-07-26 DIAGNOSIS — R278 Other lack of coordination: Secondary | ICD-10-CM | POA: Diagnosis not present

## 2015-07-26 DIAGNOSIS — R41841 Cognitive communication deficit: Secondary | ICD-10-CM | POA: Diagnosis not present

## 2015-07-26 DIAGNOSIS — M6281 Muscle weakness (generalized): Secondary | ICD-10-CM | POA: Diagnosis not present

## 2015-07-26 DIAGNOSIS — R2681 Unsteadiness on feet: Secondary | ICD-10-CM | POA: Diagnosis not present

## 2015-07-27 DIAGNOSIS — R2681 Unsteadiness on feet: Secondary | ICD-10-CM | POA: Diagnosis not present

## 2015-07-27 DIAGNOSIS — R262 Difficulty in walking, not elsewhere classified: Secondary | ICD-10-CM | POA: Diagnosis not present

## 2015-07-27 DIAGNOSIS — R41841 Cognitive communication deficit: Secondary | ICD-10-CM | POA: Diagnosis not present

## 2015-07-27 DIAGNOSIS — R296 Repeated falls: Secondary | ICD-10-CM | POA: Diagnosis not present

## 2015-07-27 DIAGNOSIS — R278 Other lack of coordination: Secondary | ICD-10-CM | POA: Diagnosis not present

## 2015-07-27 DIAGNOSIS — M6281 Muscle weakness (generalized): Secondary | ICD-10-CM | POA: Diagnosis not present

## 2015-07-28 DIAGNOSIS — M6281 Muscle weakness (generalized): Secondary | ICD-10-CM | POA: Diagnosis not present

## 2015-07-28 DIAGNOSIS — R41841 Cognitive communication deficit: Secondary | ICD-10-CM | POA: Diagnosis not present

## 2015-07-28 DIAGNOSIS — R296 Repeated falls: Secondary | ICD-10-CM | POA: Diagnosis not present

## 2015-07-28 DIAGNOSIS — R278 Other lack of coordination: Secondary | ICD-10-CM | POA: Diagnosis not present

## 2015-07-28 DIAGNOSIS — R262 Difficulty in walking, not elsewhere classified: Secondary | ICD-10-CM | POA: Diagnosis not present

## 2015-07-28 DIAGNOSIS — R2681 Unsteadiness on feet: Secondary | ICD-10-CM | POA: Diagnosis not present

## 2015-07-31 DIAGNOSIS — R262 Difficulty in walking, not elsewhere classified: Secondary | ICD-10-CM | POA: Diagnosis not present

## 2015-07-31 DIAGNOSIS — R2681 Unsteadiness on feet: Secondary | ICD-10-CM | POA: Diagnosis not present

## 2015-07-31 DIAGNOSIS — R41841 Cognitive communication deficit: Secondary | ICD-10-CM | POA: Diagnosis not present

## 2015-07-31 DIAGNOSIS — R296 Repeated falls: Secondary | ICD-10-CM | POA: Diagnosis not present

## 2015-07-31 DIAGNOSIS — R278 Other lack of coordination: Secondary | ICD-10-CM | POA: Diagnosis not present

## 2015-07-31 DIAGNOSIS — M6281 Muscle weakness (generalized): Secondary | ICD-10-CM | POA: Diagnosis not present

## 2015-08-03 DIAGNOSIS — Z4889 Encounter for other specified surgical aftercare: Secondary | ICD-10-CM | POA: Diagnosis not present

## 2015-08-03 DIAGNOSIS — R278 Other lack of coordination: Secondary | ICD-10-CM | POA: Diagnosis not present

## 2015-08-03 DIAGNOSIS — M6281 Muscle weakness (generalized): Secondary | ICD-10-CM | POA: Diagnosis not present

## 2015-08-03 DIAGNOSIS — R262 Difficulty in walking, not elsewhere classified: Secondary | ICD-10-CM | POA: Diagnosis not present

## 2015-08-04 ENCOUNTER — Encounter (HOSPITAL_BASED_OUTPATIENT_CLINIC_OR_DEPARTMENT_OTHER): Payer: Self-pay | Admitting: *Deleted

## 2015-08-04 ENCOUNTER — Emergency Department (HOSPITAL_BASED_OUTPATIENT_CLINIC_OR_DEPARTMENT_OTHER): Payer: Medicare Other

## 2015-08-04 ENCOUNTER — Emergency Department (HOSPITAL_BASED_OUTPATIENT_CLINIC_OR_DEPARTMENT_OTHER)
Admission: EM | Admit: 2015-08-04 | Discharge: 2015-08-04 | Disposition: A | Discharge status: Home / Self Care | Payer: Medicare Other | Attending: Emergency Medicine | Admitting: Emergency Medicine

## 2015-08-04 DIAGNOSIS — Y999 Unspecified external cause status: Secondary | ICD-10-CM | POA: Diagnosis not present

## 2015-08-04 DIAGNOSIS — M79672 Pain in left foot: Secondary | ICD-10-CM | POA: Diagnosis not present

## 2015-08-04 DIAGNOSIS — W19XXXA Unspecified fall, initial encounter: Secondary | ICD-10-CM | POA: Insufficient documentation

## 2015-08-04 DIAGNOSIS — Z79899 Other long term (current) drug therapy: Secondary | ICD-10-CM | POA: Insufficient documentation

## 2015-08-04 DIAGNOSIS — I482 Chronic atrial fibrillation: Secondary | ICD-10-CM | POA: Diagnosis not present

## 2015-08-04 DIAGNOSIS — S93601A Unspecified sprain of right foot, initial encounter: Secondary | ICD-10-CM | POA: Diagnosis not present

## 2015-08-04 DIAGNOSIS — Z8673 Personal history of transient ischemic attack (TIA), and cerebral infarction without residual deficits: Secondary | ICD-10-CM | POA: Diagnosis not present

## 2015-08-04 DIAGNOSIS — Y939 Activity, unspecified: Secondary | ICD-10-CM | POA: Insufficient documentation

## 2015-08-04 DIAGNOSIS — Y929 Unspecified place or not applicable: Secondary | ICD-10-CM | POA: Insufficient documentation

## 2015-08-04 DIAGNOSIS — I129 Hypertensive chronic kidney disease with stage 1 through stage 4 chronic kidney disease, or unspecified chronic kidney disease: Secondary | ICD-10-CM | POA: Insufficient documentation

## 2015-08-04 DIAGNOSIS — F329 Major depressive disorder, single episode, unspecified: Secondary | ICD-10-CM | POA: Diagnosis not present

## 2015-08-04 DIAGNOSIS — M25571 Pain in right ankle and joints of right foot: Secondary | ICD-10-CM | POA: Diagnosis not present

## 2015-08-04 DIAGNOSIS — E039 Hypothyroidism, unspecified: Secondary | ICD-10-CM | POA: Insufficient documentation

## 2015-08-04 DIAGNOSIS — M79671 Pain in right foot: Secondary | ICD-10-CM | POA: Diagnosis not present

## 2015-08-04 DIAGNOSIS — S79912A Unspecified injury of left hip, initial encounter: Secondary | ICD-10-CM | POA: Diagnosis not present

## 2015-08-04 DIAGNOSIS — S99921A Unspecified injury of right foot, initial encounter: Secondary | ICD-10-CM | POA: Diagnosis not present

## 2015-08-04 DIAGNOSIS — S99922A Unspecified injury of left foot, initial encounter: Secondary | ICD-10-CM | POA: Diagnosis not present

## 2015-08-04 DIAGNOSIS — N183 Chronic kidney disease, stage 3 (moderate): Secondary | ICD-10-CM | POA: Insufficient documentation

## 2015-08-04 DIAGNOSIS — M25552 Pain in left hip: Secondary | ICD-10-CM | POA: Diagnosis not present

## 2015-08-04 DIAGNOSIS — M7989 Other specified soft tissue disorders: Secondary | ICD-10-CM | POA: Diagnosis not present

## 2015-08-04 NOTE — ED Notes (Signed)
MD at bedside. 

## 2015-08-04 NOTE — ED Triage Notes (Signed)
She fell yesterday. Injury to her left lower leg. Swelling to both feet. Abrasion to her left lower leg.

## 2015-08-04 NOTE — ED Notes (Signed)
Patient transported to X-ray 

## 2015-08-04 NOTE — ED Provider Notes (Signed)
[facility] Provider Note   CSN: [id number] Arrival date & time: 08/04/15  1641  First Provider Contact:  First MD Initiated Contact with Patient 08/04/15 1651        History   Chief Complaint Chief Complaint  Patient presents with  . Leg Pain    HPI Marie Robertson is a 82 y.o. female.  Pt is here from NH due to swelling in her feet.  She has a hx of dementia and does not recall a fall.  However, the pt's son said she fell yesterday.  Pt is able to stand.  She denies any pain.    Leg Pain      Past Medical History:  Diagnosis Date  . Chronic atrial fibrillation ([place])   . Chronic renal insufficiency, stage III (moderate)   . Constipation   . CVA (cerebral infarction) approx 2012   residual defect: poor R sided peripheral vision per son  . Depression   . History of fracture of right hip 03/2015  . Hyperlipidemia   . Hypertension   . Hypothyroidism   . Insomnia   . Osteoarthritis    wrists, hands  . Osteoporosis     Patient Active Problem List   Diagnosis Date Noted  . Confusion 06/20/2015  . Recent urinary tract infection 06/20/2015  . Dysuria 04/10/2015  . Edema 04/06/2015  . Acute encephalopathy 03/29/2015  . Vitamin D deficiency 03/29/2015  . UTI (lower urinary tract infection) 03/21/2015  . Hip fracture ([place]) 03/20/2015  . Chronic atrial fibrillation ([place]) 02/20/2015  . Hyperlipidemia 02/20/2015  . Essential hypertension, benign 12/13/2012  . Cardiac arrhythmia 12/13/2012  . Urinary incontinence 12/13/2012  . Hypothyroidism 12/13/2012  . History of depression 12/11/2012    Past Surgical History:  Procedure Laterality Date  . APPENDECTOMY    . COLONOSCOPY  08/22/08  . INTRAMEDULLARY (IM) NAIL INTERTROCHANTERIC Right 03/22/2015   Procedure: RIGHT femoral HIP NAILING;  Surgeon: Gaynelle Arabian, MD;  Location: [facility];  Service: Orthopedics;  Laterality: Right;  . KNEE ARTHROSCOPY Right   . THYROID SURGERY      OB History    No data  available       Home Medications    Prior to Admission medications   Medication Sig Start Date End Date Taking? Authorizing Provider  atenolol (TENORMIN) 50 MG tablet Take 50 mg by mouth daily.    Historical Provider, MD  Cholecalciferol (VITAMIN D-3) 1000 units CAPS Take 3,000 Units by mouth daily.     Historical Provider, MD  ELIQUIS 2.5 MG TABS tablet Take 2.5 mg by mouth 2 (two) times daily. 01/24/15   Historical Provider, MD  HYDROcodone-acetaminophen (NORCO/VICODIN) 5-325 MG tablet Take 1-2 tablets by mouth every 6 (six) hours as needed for moderate pain. 04/28/15   Tiffany L Reed, DO  levothyroxine (SYNTHROID, LEVOTHROID) 75 MCG tablet Take 1 tablet (75 mcg total) by mouth daily. 02/22/15   Tammi Sou, MD  pravastatin (PRAVACHOL) 40 MG tablet Take 1 tablet (40 mg total) by mouth at bedtime. 02/23/15   Tammi Sou, MD  senna-docusate (SENOKOT-S) 8.6-50 MG tablet Take 1 tablet by mouth at bedtime as needed for mild constipation. 03/24/15   Albertine Patricia, MD  traMADol (ULTRAM) 50 MG tablet 1 tab po q6h prn pain 06/09/15   Tammi Sou, MD  zolpidem (AMBIEN) 5 MG tablet Take 1 tablet (5 mg total) by mouth at bedtime. 06/01/15   Tammi Sou, MD    Family History Family  History  Problem Relation Age of Onset  . Stroke Mother   . Diabetes Mother   . Alcohol abuse Father     Social History Social History  Substance Use Topics  . Smoking status: Never Smoker  . Smokeless tobacco: Never Used  . Alcohol use No     Allergies   Review of patient's allergies indicates no known allergies.   Review of Systems Review of Systems  Musculoskeletal:       Right foot and ankle pain.  All other systems reviewed and are negative.    Physical Exam Updated Vital Signs BP (!) 160/108   Pulse 90   Temp 98 F (36.7 C) (Oral)   Resp 18   Ht 5\' 2"  (1.575 m)   Wt 115 lb (52.2 kg)   LMP 01/08/1972   SpO2 98%   BMI 21.03 kg/m   Physical Exam  Constitutional: She  appears well-developed and well-nourished.  HENT:  Head: Normocephalic and atraumatic.  Right Ear: External ear normal.  Left Ear: External ear normal.  Nose: Nose normal.  Mouth/Throat: Oropharynx is clear and moist.  Eyes: Conjunctivae and EOM are normal. Pupils are equal, round, and reactive to light.  Neck: Normal range of motion. Neck supple.  Cardiovascular: Normal rate, regular rhythm, normal heart sounds and intact distal pulses.   Pulmonary/Chest: Effort normal and breath sounds normal.  Abdominal: Soft. Bowel sounds are normal.  Musculoskeletal: Normal range of motion.       Right ankle: She exhibits swelling.  Neurological: She is alert.  Skin: Skin is warm and dry.  Psychiatric: She has a normal mood and affect. Her behavior is normal. Judgment and thought content normal.  Nursing note and vitals reviewed.    ED Treatments / Results  Labs (all labs ordered are listed, but only abnormal results are displayed) Labs Reviewed - No data to display  EKG  EKG Interpretation None       Radiology Dg Ankle Complete Right  Result Date: 08/04/2015 CLINICAL DATA:  Acute right ankle pain following fall. Initial encounter. EXAM: RIGHT ANKLE - COMPLETE 3+ VIEW COMPARISON:  None. FINDINGS: There is no evidence of acute fracture, subluxation or dislocation. Mild soft tissue swelling identified. No focal bony lesions are identified. IMPRESSION: Mild soft tissue swelling without acute bony abnormality. Electronically Signed   By: Margarette Canada M.D.   On: 08/04/2015 18:15  Dg Foot Complete Left  Result Date: 08/04/2015 CLINICAL DATA:  Acute left foot pain following fall. Initial encounter. EXAM: LEFT FOOT - COMPLETE 3+ VIEW COMPARISON:  None. FINDINGS: There is no evidence of acute fracture, subluxation or dislocation. Hallux valgus and bunion noted. Degenerative changes of the second toe PIP joint noted. IMPRESSION: No evidence of acute bony abnormality. Electronically Signed   By:  Margarette Canada M.D.   On: 08/04/2015 18:18  Dg Foot Complete Right  Result Date: 08/04/2015 CLINICAL DATA:  Fall with right foot pain.  Initial encounter. EXAM: RIGHT FOOT COMPLETE - 3+ VIEW COMPARISON:  None. FINDINGS: There is no evidence of acute fracture. Internal plate and screw fixation across the first MTP joint noted. Degenerative changes in the midfoot are present. Chronic appearing dislocation at the little toe PIP joint noted. IMPRESSION: No evidence of acute abnormality. Electronically Signed   By: Margarette Canada M.D.   On: 08/04/2015 18:14  Dg Hip Unilat W Or Wo Pelvis 2-3 Views Left  Result Date: 08/04/2015 CLINICAL DATA:  Acute left hip pain following fall. Initial encounter. EXAM:  DG HIP (WITH OR WITHOUT PELVIS) 2-3V LEFT COMPARISON:  03/22/2015 and prior studies FINDINGS: There is no evidence of acute fracture, subluxation or dislocation. Internal fixation of remote proximal right femur fracture again noted. No focal bony lesions are identified. IMPRESSION: No acute abnormality. Electronically Signed   By: Margarette Canada M.D.   On: 08/04/2015 18:17   Procedures Procedures (including critical care time)  Medications Ordered in ED Medications - No data to display   Initial Impression / Assessment and Plan / ED Course  I have reviewed the triage vital signs and the nursing notes.  Pertinent labs & imaging results that were available during my care of the patient were reviewed by me and considered in my medical decision making (see chart for details).  Clinical Course   Pt told the xray tech that her left foot and hip was hurting, so I added that on to xrays.  The pt is able to put weight on both feet and transfer.  Pt ok for d/c.  F/u with ortho if pain and swelling continue.  Final Clinical Impressions(s) / ED Diagnoses   Final diagnoses:  Right foot sprain, initial encounter    New Prescriptions New Prescriptions   No medications on file     Isla Pence, MD 08/04/15  1835

## 2015-08-05 DIAGNOSIS — Z9181 History of falling: Secondary | ICD-10-CM | POA: Diagnosis not present

## 2015-08-05 DIAGNOSIS — Z7901 Long term (current) use of anticoagulants: Secondary | ICD-10-CM | POA: Diagnosis not present

## 2015-08-05 DIAGNOSIS — R2681 Unsteadiness on feet: Secondary | ICD-10-CM | POA: Diagnosis not present

## 2015-08-05 DIAGNOSIS — Z96651 Presence of right artificial knee joint: Secondary | ICD-10-CM | POA: Diagnosis not present

## 2015-08-05 DIAGNOSIS — N183 Chronic kidney disease, stage 3 (moderate): Secondary | ICD-10-CM | POA: Diagnosis not present

## 2015-08-05 DIAGNOSIS — F339 Major depressive disorder, recurrent, unspecified: Secondary | ICD-10-CM | POA: Diagnosis not present

## 2015-08-05 DIAGNOSIS — I482 Chronic atrial fibrillation: Secondary | ICD-10-CM | POA: Diagnosis not present

## 2015-08-05 DIAGNOSIS — I129 Hypertensive chronic kidney disease with stage 1 through stage 4 chronic kidney disease, or unspecified chronic kidney disease: Secondary | ICD-10-CM | POA: Diagnosis not present

## 2015-08-05 DIAGNOSIS — Z8673 Personal history of transient ischemic attack (TIA), and cerebral infarction without residual deficits: Secondary | ICD-10-CM | POA: Diagnosis not present

## 2015-08-06 ENCOUNTER — Encounter (HOSPITAL_COMMUNITY): Payer: Self-pay | Admitting: Emergency Medicine

## 2015-08-06 ENCOUNTER — Emergency Department (HOSPITAL_COMMUNITY): Payer: Medicare Other

## 2015-08-06 ENCOUNTER — Emergency Department (HOSPITAL_COMMUNITY)
Admission: EM | Admit: 2015-08-06 | Discharge: 2015-08-06 | Disposition: A | Discharge status: Home / Self Care | Payer: Medicare Other | Attending: Emergency Medicine | Admitting: Emergency Medicine

## 2015-08-06 DIAGNOSIS — S022XXA Fracture of nasal bones, initial encounter for closed fracture: Secondary | ICD-10-CM | POA: Insufficient documentation

## 2015-08-06 DIAGNOSIS — R259 Unspecified abnormal involuntary movements: Secondary | ICD-10-CM | POA: Diagnosis not present

## 2015-08-06 DIAGNOSIS — Z79899 Other long term (current) drug therapy: Secondary | ICD-10-CM | POA: Diagnosis not present

## 2015-08-06 DIAGNOSIS — S199XXA Unspecified injury of neck, initial encounter: Secondary | ICD-10-CM | POA: Diagnosis not present

## 2015-08-06 DIAGNOSIS — Z23 Encounter for immunization: Secondary | ICD-10-CM | POA: Insufficient documentation

## 2015-08-06 DIAGNOSIS — W19XXXA Unspecified fall, initial encounter: Secondary | ICD-10-CM

## 2015-08-06 DIAGNOSIS — S0083XA Contusion of other part of head, initial encounter: Secondary | ICD-10-CM | POA: Insufficient documentation

## 2015-08-06 DIAGNOSIS — Y92009 Unspecified place in unspecified non-institutional (private) residence as the place of occurrence of the external cause: Secondary | ICD-10-CM | POA: Insufficient documentation

## 2015-08-06 DIAGNOSIS — Z8673 Personal history of transient ischemic attack (TIA), and cerebral infarction without residual deficits: Secondary | ICD-10-CM | POA: Insufficient documentation

## 2015-08-06 DIAGNOSIS — E039 Hypothyroidism, unspecified: Secondary | ICD-10-CM | POA: Insufficient documentation

## 2015-08-06 DIAGNOSIS — Y999 Unspecified external cause status: Secondary | ICD-10-CM | POA: Diagnosis not present

## 2015-08-06 DIAGNOSIS — I129 Hypertensive chronic kidney disease with stage 1 through stage 4 chronic kidney disease, or unspecified chronic kidney disease: Secondary | ICD-10-CM | POA: Insufficient documentation

## 2015-08-06 DIAGNOSIS — Z7901 Long term (current) use of anticoagulants: Secondary | ICD-10-CM | POA: Insufficient documentation

## 2015-08-06 DIAGNOSIS — G4489 Other headache syndrome: Secondary | ICD-10-CM | POA: Diagnosis not present

## 2015-08-06 DIAGNOSIS — N183 Chronic kidney disease, stage 3 (moderate): Secondary | ICD-10-CM | POA: Insufficient documentation

## 2015-08-06 DIAGNOSIS — R2689 Other abnormalities of gait and mobility: Secondary | ICD-10-CM | POA: Diagnosis not present

## 2015-08-06 DIAGNOSIS — S098XXA Other specified injuries of head, initial encounter: Secondary | ICD-10-CM | POA: Diagnosis not present

## 2015-08-06 DIAGNOSIS — W228XXA Striking against or struck by other objects, initial encounter: Secondary | ICD-10-CM | POA: Diagnosis not present

## 2015-08-06 DIAGNOSIS — Y939 Activity, unspecified: Secondary | ICD-10-CM | POA: Diagnosis not present

## 2015-08-06 DIAGNOSIS — S0990XA Unspecified injury of head, initial encounter: Secondary | ICD-10-CM

## 2015-08-06 DIAGNOSIS — S0093XA Contusion of unspecified part of head, initial encounter: Secondary | ICD-10-CM | POA: Diagnosis not present

## 2015-08-06 LAB — URINALYSIS, ROUTINE W REFLEX MICROSCOPIC
Bilirubin Urine: NEGATIVE
Glucose, UA: NEGATIVE mg/dL
Hgb urine dipstick: NEGATIVE
Ketones, ur: NEGATIVE mg/dL
NITRITE: NEGATIVE
PH: 6 (ref 5.0–8.0)
Protein, ur: NEGATIVE mg/dL
SPECIFIC GRAVITY, URINE: 1.01 (ref 1.005–1.030)

## 2015-08-06 LAB — BASIC METABOLIC PANEL
Anion gap: 10 (ref 5–15)
BUN: 25 mg/dL — AB (ref 6–20)
CALCIUM: 8.7 mg/dL — AB (ref 8.9–10.3)
CHLORIDE: 97 mmol/L — AB (ref 101–111)
CO2: 26 mmol/L (ref 22–32)
CREATININE: 1.44 mg/dL — AB (ref 0.44–1.00)
GFR, EST AFRICAN AMERICAN: 35 mL/min — AB (ref >60)
GFR, EST NON AFRICAN AMERICAN: 30 mL/min — AB (ref >60)
Glucose, Bld: 125 mg/dL — ABNORMAL HIGH (ref 65–99)
Potassium: 3.6 mmol/L (ref 3.5–5.1)
SODIUM: 133 mmol/L — AB (ref 135–145)

## 2015-08-06 LAB — CBC WITH DIFFERENTIAL/PLATELET
BASOS PCT: 0 %
Basophils Absolute: 0 10*3/uL (ref 0.0–0.1)
EOS ABS: 0.2 10*3/uL (ref 0.0–0.7)
EOS PCT: 3 %
HCT: 33.2 % — ABNORMAL LOW (ref 36.0–46.0)
Hemoglobin: 10.7 g/dL — ABNORMAL LOW (ref 12.0–15.0)
LYMPHS ABS: 1 10*3/uL (ref 0.7–4.0)
Lymphocytes Relative: 11 %
MCH: 28.5 pg (ref 26.0–34.0)
MCHC: 32.2 g/dL (ref 30.0–36.0)
MCV: 88.5 fL (ref 78.0–100.0)
MONO ABS: 1 10*3/uL (ref 0.1–1.0)
MONOS PCT: 11 %
Neutro Abs: 6.7 10*3/uL (ref 1.7–7.7)
Neutrophils Relative %: 75 %
PLATELETS: 282 10*3/uL (ref 150–400)
RBC: 3.75 MIL/uL — ABNORMAL LOW (ref 3.87–5.11)
RDW: 16.2 % — AB (ref 11.5–15.5)
WBC: 9 10*3/uL (ref 4.0–10.5)

## 2015-08-06 LAB — URINE MICROSCOPIC-ADD ON

## 2015-08-06 MED ORDER — ONDANSETRON 4 MG PO TBDP: 4.0000 mg | ORAL_TABLET | Freq: Three times a day (TID) | ORAL | 0 refills | Status: DC | PRN

## 2015-08-06 MED ORDER — ACETAMINOPHEN 500 MG PO TABS
1000.0000 mg | ORAL_TABLET | Freq: Once | ORAL | Status: AC
  Administered 2015-08-06: 1000 mg via ORAL
  Filled 2015-08-06: qty 2

## 2015-08-06 MED ORDER — ACETAMINOPHEN 500 MG PO TABS: 1000.0000 mg | ORAL_TABLET | Freq: Four times a day (QID) | ORAL | 0 refills | Status: DC | PRN

## 2015-08-06 MED ORDER — TETANUS-DIPHTH-ACELL PERTUSSIS 5-2.5-18.5 LF-MCG/0.5 IM SUSP
0.5000 mL | Freq: Once | INTRAMUSCULAR | Status: AC
  Administered 2015-08-06: 0.5 mL via INTRAMUSCULAR
  Filled 2015-08-06: qty 0.5

## 2015-08-06 MED ORDER — BACITRACIN ZINC 500 UNIT/GM EX OINT
TOPICAL_OINTMENT | Freq: Once | CUTANEOUS | Status: AC
  Administered 2015-08-06: 1 via TOPICAL
  Filled 2015-08-06: qty 0.9

## 2015-08-06 MED ORDER — ONDANSETRON HCL 4 MG/2ML IJ SOLN
4.0000 mg | Freq: Once | INTRAMUSCULAR | Status: AC
  Administered 2015-08-06: 4 mg via INTRAVENOUS
  Filled 2015-08-06: qty 2

## 2015-08-06 MED ORDER — BACITRACIN ZINC 500 UNIT/GM EX OINT: 1.0000 "application " | TOPICAL_OINTMENT | Freq: Two times a day (BID) | CUTANEOUS | 0 refills | Status: DC

## 2015-08-06 NOTE — ED Notes (Signed)
Pt taken to CT.

## 2015-08-06 NOTE — Discharge Instructions (Signed)
You may take Tylenol 1000 mg every 6 hours as needed for pain. Please keep your head elevated with pillows or the head of your bed elevated when sleeping for the next week. He may apply ice to areas that hurt or bruised for 15-20 minutes at a time several times a day as needed. May apply bacitracin ointment to the abrasions to her face twice a day for the next week. Please do not blow your nose for the next 3 days or put anything into your nose. If you develop a nosebleed, please hold pressure for 15-20 minutes without stopping. If this does not stop the bleeding, please return to the hospital. If you develop changes in her mental status, severe headache, vomiting, numbness or weakness on one side of your body, changes in your speech, please return to the hospital.

## 2015-08-06 NOTE — ED Provider Notes (Signed)
By signing my name below, I, Altamease Oiler, attest that this documentation has been prepared under the direction and in the presence of [place], DO. Electronically Signed: Altamease Oiler, ED Scribe. 08/06/15. 2:08 AM.  TIME SEEN: 2:02 AM  CHIEF COMPLAINT: Fall  HPI: Marie Robertson is a 82 y.o. female with PMHx of CVA, chronic a-fib, HTN, HLD, and chronic renal insufficiency who presents to the Emergency Department for evaluation after a fall tonight. Pt lost balance while getting out of the car and fell, striking her head on the ground.  Associated symptoms include headache, nasal pain, and abrasions at the forehead . Pt denies other injury, chest pain, abdominal pain. Last tetanus was "a while ago". Denies any numbness, tingling or focal weakness.   ROS: See HPI Constitutional: no fever  Eyes: no drainage  ENT: no runny nose   Cardiovascular:  no chest pain  Resp: no SOB  GI: no vomiting GU: no dysuria Integumentary: no rash  Allergy: no hives  Musculoskeletal: no leg swelling  Neurological: no slurred speech ROS otherwise negative  PAST MEDICAL HISTORY/PAST SURGICAL HISTORY:  Past Medical History:  Diagnosis Date  . Chronic atrial fibrillation ([place])   . Chronic renal insufficiency, stage III (moderate)   . Constipation   . CVA (cerebral infarction) approx 2012   residual defect: poor R sided peripheral vision per son  . Depression   . History of fracture of right hip 03/2015  . Hyperlipidemia   . Hypertension   . Hypothyroidism   . Insomnia   . Osteoarthritis    wrists, hands  . Osteoporosis     MEDICATIONS:  Prior to Admission medications   Medication Sig Start Date End Date Taking? Authorizing Provider  acetaminophen (TYLENOL) 325 MG tablet Take 650 mg by mouth every 6 (six) hours as needed for moderate pain.   Yes Historical Provider, MD  atenolol (TENORMIN) 50 MG tablet Take 50 mg by mouth daily.   Yes Historical Provider, MD  Cholecalciferol  (VITAMIN D-3) 1000 units CAPS Take 3,000 Units by mouth daily.    Yes Historical Provider, MD  ELIQUIS 2.5 MG TABS tablet Take 2.5 mg by mouth 2 (two) times daily. 01/24/15  Yes Historical Provider, MD  feeding supplement, ENSURE ENLIVE, (ENSURE ENLIVE) LIQD Take 237 mLs by mouth 2 (two) times daily between meals.   Yes Historical Provider, MD  HYDROcodone-acetaminophen (NORCO/VICODIN) 5-325 MG tablet Take 1-2 tablets by mouth every 6 (six) hours as needed for moderate pain. 04/28/15  Yes Tiffany L Reed, DO  levothyroxine (SYNTHROID, LEVOTHROID) 75 MCG tablet Take 1 tablet (75 mcg total) by mouth daily. Patient taking differently: Take 37.5-75 mcg by mouth See admin instructions. Take 1/2 tablet on Tuesday and Thursday then take 1 tablet all the other days 02/22/15  Yes Tammi Sou, MD  pravastatin (PRAVACHOL) 40 MG tablet Take 1 tablet (40 mg total) by mouth at bedtime. 02/23/15  Yes Tammi Sou, MD  senna-docusate (SENOKOT-S) 8.6-50 MG tablet Take 1 tablet by mouth at bedtime as needed for mild constipation. Patient taking differently: Take 1 tablet by mouth at bedtime.  03/24/15  Yes Albertine Patricia, MD  traMADol (ULTRAM) 50 MG tablet 1 tab po q6h prn pain Patient taking differently: Take 50 mg by mouth every 6 (six) hours as needed for moderate pain.  06/09/15  Yes Tammi Sou, MD  zolpidem (AMBIEN) 5 MG tablet Take 1 tablet (5 mg total) by mouth at bedtime. 06/01/15  Yes Adrian Blackwater  McGowen, MD    ALLERGIES:  No Known Allergies  SOCIAL HISTORY:  Social History  Substance Use Topics  . Smoking status: Never Smoker  . Smokeless tobacco: Never Used  . Alcohol use No    FAMILY HISTORY: Family History  Problem Relation Age of Onset  . Stroke Mother   . Diabetes Mother   . Alcohol abuse Father     EXAM: BP (!) 173/113   Pulse 88   Temp 99.1 F (37.3 C) (Oral)   Resp 17   Ht 5\' 5"  (1.651 m)   Wt 115 lb (52.2 kg)   LMP 01/08/1972   SpO2 96%   BMI 19.14 kg/m   CONSTITUTIONAL: Alert and oriented X3 but unclear on details of the fall, responds appropriately to questions.Elderly, chronically ill-appearing and appear uncomfortable, GCS 15 HEAD: Normocephalic; large hematoma to the right forehead with associated abrasions and no lacerations EYES: Conjunctivae clear, PERRL, EOMI ENT: swelling to the bridge of her nose with ecchymosis and abrasion; no rhinorrhea; moist mucous membranes; pharynx without lesions noted; no dental injury; no septal hematoma NECK: Pt in a cervical collar, Supple, no meningismus, no LAD; no midline spinal tenderness, step-off or deformity CARD: irregularly irregular; S1 and S2 appreciated; no murmurs, no clicks, no rubs, no gallops RESP: Normal chest excursion without splinting or tachypnea; breath sounds clear and equal bilaterally; no wheezes, no rhonchi, no rales; no hypoxia or respiratory distress CHEST:  chest wall stable, no crepitus or ecchymosis or deformity, nontender to palpation ABD/GI: Normal bowel sounds; non-distended; soft, non-tender, no rebound, no guarding PELVIS:  stable, nontender to palpation BACK:  The back appears normal and is non-tender to palpation, there is no CVA tenderness; no midline spinal tenderness, step-off or deformity EXT: Normal ROM in all joints; non-tender to palpation; no edema; normal capillary refill; no cyanosis, no bony tenderness or bony deformity of patient's extremities, no joint effusion, no ecchymosis or lacerations    SKIN: Normal color for age and race; warm NEURO: Moves all extremities equally, sensation to light touch intact diffusely, cranial nerves II through XII intact PSYCH: The patient's mood and manner are appropriate. Grooming and personal hygiene are appropriate.  MEDICAL DECISION MAKING: Patient here with head injury. Does change her story somewhat on how she injured herself. Therefore will obtain labs, urine, EKG. She is on our request because of history of atrial  fibrillation. No other sign of trauma on exam. She is in a c-collar. We'll obtain CT of her head and cervical spine. We'll give Tylenol for pain and update tetanus.  ED PROGRESS: Patient dry heaving. We'll give IV Zofran.   CT of her head and cervical spine show nasal fractures but no other acute abnormality. We'll ambulate patient and clear her C-spine clinically.  Will fluid challenge patient.    Patient reports feeling better, drinking without difficulty. Able to ambulate. Her son is at bedside. He reports that she fell out of bed at the nursing home. Reports she has dementia and is at her baseline. She states she feels much better after Tylenol, Zofran. We'll discharge with prescriptions for the same. Will apply bacitracin to her wounds. Recommended ice to her face, keeping the head of the bed elevated or pillows under her head, recommend soft diet that she does not blow her nose for 3 days. We'll give her ENT follow-up as needed. Discussed possibility of delayed head bleed given she is on our request and what signs to look out for. Son is comfortable with  this plan.    At this time, I do not feel there is any life-threatening condition present. I have reviewed and discussed all results (EKG, imaging, lab, urine as appropriate), exam findings with patient. I have reviewed nursing notes and appropriate previous records.  I feel the patient is safe to be discharged home without further emergent workup. Discussed usual and customary return precautions. Patient and family (if present) verbalize understanding and are comfortable with this plan.  Patient will follow-up with their primary care provider. If they do not have a primary care provider, information for follow-up has been provided to them. All questions have been answered.      EKG Interpretation  Date/Time:  Sunday August 06 2015 02:33:54 EDT Ventricular Rate:  102 PR Interval:    QRS Duration: 156 QT Interval:  397 QTC  Calculation: 518 R Axis:   103 Text Interpretation:  Atrial fibrillation RBBB and LPFB No significant change since last tracing Confirmed by Kale Rondeau,  DO, Paislie Tessler [id number]) on 08/06/2015 3:26:17 AM        I personally performed the services described in this documentation, which was scribed in my presence. The recorded information has been reviewed and is accurate.     [place], DO 08/06/15 [phone]

## 2015-08-07 ENCOUNTER — Telehealth: Payer: Self-pay | Admitting: Family Medicine

## 2015-08-07 DIAGNOSIS — Z9181 History of falling: Secondary | ICD-10-CM | POA: Diagnosis not present

## 2015-08-07 DIAGNOSIS — R2681 Unsteadiness on feet: Secondary | ICD-10-CM | POA: Diagnosis not present

## 2015-08-07 DIAGNOSIS — I129 Hypertensive chronic kidney disease with stage 1 through stage 4 chronic kidney disease, or unspecified chronic kidney disease: Secondary | ICD-10-CM | POA: Diagnosis not present

## 2015-08-07 DIAGNOSIS — N183 Chronic kidney disease, stage 3 (moderate): Secondary | ICD-10-CM | POA: Diagnosis not present

## 2015-08-07 DIAGNOSIS — I482 Chronic atrial fibrillation: Secondary | ICD-10-CM | POA: Diagnosis not present

## 2015-08-07 DIAGNOSIS — F339 Major depressive disorder, recurrent, unspecified: Secondary | ICD-10-CM | POA: Diagnosis not present

## 2015-08-07 NOTE — Telephone Encounter (Signed)
Wil, physical therapist, calling for a VO for PT to be as follows:  1 x a week week one 2 x a week week 2, 3 and 4  1 x a week week five

## 2015-08-07 NOTE — Telephone Encounter (Signed)
Per Dr. Anitra Lauth okay to do PT as requested below.

## 2015-08-08 DIAGNOSIS — Z9181 History of falling: Secondary | ICD-10-CM | POA: Diagnosis not present

## 2015-08-08 DIAGNOSIS — F339 Major depressive disorder, recurrent, unspecified: Secondary | ICD-10-CM | POA: Diagnosis not present

## 2015-08-08 DIAGNOSIS — N183 Chronic kidney disease, stage 3 (moderate): Secondary | ICD-10-CM | POA: Diagnosis not present

## 2015-08-08 DIAGNOSIS — R2681 Unsteadiness on feet: Secondary | ICD-10-CM | POA: Diagnosis not present

## 2015-08-08 DIAGNOSIS — I482 Chronic atrial fibrillation: Secondary | ICD-10-CM | POA: Diagnosis not present

## 2015-08-08 DIAGNOSIS — I129 Hypertensive chronic kidney disease with stage 1 through stage 4 chronic kidney disease, or unspecified chronic kidney disease: Secondary | ICD-10-CM | POA: Diagnosis not present

## 2015-08-09 ENCOUNTER — Encounter (HOSPITAL_BASED_OUTPATIENT_CLINIC_OR_DEPARTMENT_OTHER): Payer: Self-pay | Admitting: *Deleted

## 2015-08-09 ENCOUNTER — Emergency Department (HOSPITAL_BASED_OUTPATIENT_CLINIC_OR_DEPARTMENT_OTHER): Payer: Medicare Other

## 2015-08-09 ENCOUNTER — Emergency Department (HOSPITAL_BASED_OUTPATIENT_CLINIC_OR_DEPARTMENT_OTHER)
Admission: EM | Admit: 2015-08-09 | Discharge: 2015-08-10 | Disposition: A | Discharge status: Home / Self Care | Payer: Medicare Other | Attending: Emergency Medicine | Admitting: Emergency Medicine

## 2015-08-09 DIAGNOSIS — S81812A Laceration without foreign body, left lower leg, initial encounter: Secondary | ICD-10-CM | POA: Insufficient documentation

## 2015-08-09 DIAGNOSIS — S0083XA Contusion of other part of head, initial encounter: Secondary | ICD-10-CM | POA: Insufficient documentation

## 2015-08-09 DIAGNOSIS — E039 Hypothyroidism, unspecified: Secondary | ICD-10-CM | POA: Insufficient documentation

## 2015-08-09 DIAGNOSIS — S8992XA Unspecified injury of left lower leg, initial encounter: Secondary | ICD-10-CM | POA: Diagnosis not present

## 2015-08-09 DIAGNOSIS — S8002XA Contusion of left knee, initial encounter: Secondary | ICD-10-CM | POA: Insufficient documentation

## 2015-08-09 DIAGNOSIS — M25562 Pain in left knee: Secondary | ICD-10-CM | POA: Diagnosis not present

## 2015-08-09 DIAGNOSIS — S1093XA Contusion of unspecified part of neck, initial encounter: Secondary | ICD-10-CM | POA: Insufficient documentation

## 2015-08-09 DIAGNOSIS — Y929 Unspecified place or not applicable: Secondary | ICD-10-CM | POA: Insufficient documentation

## 2015-08-09 DIAGNOSIS — Y999 Unspecified external cause status: Secondary | ICD-10-CM | POA: Diagnosis not present

## 2015-08-09 DIAGNOSIS — H1131 Conjunctival hemorrhage, right eye: Secondary | ICD-10-CM | POA: Insufficient documentation

## 2015-08-09 DIAGNOSIS — I482 Chronic atrial fibrillation: Secondary | ICD-10-CM | POA: Diagnosis not present

## 2015-08-09 DIAGNOSIS — S0011XA Contusion of right eyelid and periocular area, initial encounter: Secondary | ICD-10-CM | POA: Diagnosis not present

## 2015-08-09 DIAGNOSIS — I10 Essential (primary) hypertension: Secondary | ICD-10-CM | POA: Diagnosis not present

## 2015-08-09 DIAGNOSIS — Y939 Activity, unspecified: Secondary | ICD-10-CM | POA: Insufficient documentation

## 2015-08-09 DIAGNOSIS — W1839XA Other fall on same level, initial encounter: Secondary | ICD-10-CM | POA: Insufficient documentation

## 2015-08-09 MED ORDER — ACETAMINOPHEN 500 MG PO TABS
1000.0000 mg | ORAL_TABLET | Freq: Once | ORAL | Status: AC
  Administered 2015-08-10: 1000 mg via ORAL
  Filled 2015-08-09: qty 2

## 2015-08-09 NOTE — ED Provider Notes (Signed)
[facility] Provider Note   CSN: [id number] Arrival date & time: 08/09/15  2128  First Provider Contact:  First MD Initiated Contact with Patient 08/09/15 2346    By signing my name below, I, Evelene Croon, attest that this documentation has been prepared under the direction and in the presence of Chales Pelissier, MD . Electronically Signed: Evelene Croon, Scribe. 08/10/2015. 12:05 AM.  History   Chief Complaint Chief Complaint  Patient presents with  . Fall     The history is provided by the patient and a relative (son). No language interpreter was used.  Fall  This is a recurrent problem. The problem has not changed since onset.Pertinent negatives include no chest pain, no abdominal pain, no headaches and no shortness of breath. The symptoms are relieved by acetaminophen. She has tried acetaminophen for the symptoms. The treatment provided moderate relief.   HPI Comments:  Marie Robertson is a 82 y.o. female who presents to the Emergency Department s/p fall  this afternoon complaining of constant, knee pain. Per pt's son, this is her 3rd fall in 3 days. Pt lives at [facility] assisted living. Son states she pushed her call button and by the time someone came to her she was standing and fell onto her knees; no LOC.Pt has been taking tylenol with moderate relief.  Pt was evaluated at [facility] following a fall a few days ago  where she had several imaging studies. Tetanus is UTD. No alleviating factors noted.    Past Medical History:  Diagnosis Date  . Chronic atrial fibrillation ([place])   . Chronic renal insufficiency, stage III (moderate)   . Constipation   . CVA (cerebral infarction) approx 2012   residual defect: poor R sided peripheral vision per son  . Depression   . History of fracture of right hip 03/2015  . Hyperlipidemia   . Hypertension   . Hypothyroidism   . Insomnia   . Osteoarthritis    wrists, hands  . Osteoporosis     Patient Active Problem List     Diagnosis Date Noted  . Confusion 06/20/2015  . Recent urinary tract infection 06/20/2015  . Dysuria 04/10/2015  . Edema 04/06/2015  . Acute encephalopathy 03/29/2015  . Vitamin D deficiency 03/29/2015  . UTI (lower urinary tract infection) 03/21/2015  . Hip fracture ([place]) 03/20/2015  . Chronic atrial fibrillation ([place]) 02/20/2015  . Hyperlipidemia 02/20/2015  . Essential hypertension, benign 12/13/2012  . Cardiac arrhythmia 12/13/2012  . Urinary incontinence 12/13/2012  . Hypothyroidism 12/13/2012  . History of depression 12/11/2012    Past Surgical History:  Procedure Laterality Date  . APPENDECTOMY    . COLONOSCOPY  08/22/08  . INTRAMEDULLARY (IM) NAIL INTERTROCHANTERIC Right 03/22/2015   Procedure: RIGHT femoral HIP NAILING;  Surgeon: Gaynelle Arabian, MD;  Location: [facility];  Service: Orthopedics;  Laterality: Right;  . KNEE ARTHROSCOPY Right   . THYROID SURGERY      OB History    No data available       Home Medications    Prior to Admission medications   Medication Sig Start Date End Date Taking? Authorizing Provider  acetaminophen (TYLENOL) 500 MG tablet Take 2 tablets (1,000 mg total) by mouth every 6 (six) hours as needed for mild pain, moderate pain or headache. 08/06/15   Kristen N Ward, DO  atenolol (TENORMIN) 50 MG tablet Take 50 mg by mouth daily.    Historical Provider, MD  bacitracin ointment Apply 1 application topically 2 (two) times  daily. Apply to facial abrasions for one week 08/06/15   Delice Bison Ward, DO  Cholecalciferol (VITAMIN D-3) 1000 units CAPS Take 3,000 Units by mouth daily.     Historical Provider, MD  ELIQUIS 2.5 MG TABS tablet Take 2.5 mg by mouth 2 (two) times daily. 01/24/15   Historical Provider, MD  feeding supplement, ENSURE ENLIVE, (ENSURE ENLIVE) LIQD Take 237 mLs by mouth 2 (two) times daily between meals.    Historical Provider, MD  HYDROcodone-acetaminophen (NORCO/VICODIN) 5-325 MG tablet Take 1-2 tablets by mouth every 6 (six) hours  as needed for moderate pain. 04/28/15   Tiffany L Reed, DO  levothyroxine (SYNTHROID, LEVOTHROID) 75 MCG tablet Take 1 tablet (75 mcg total) by mouth daily. Patient taking differently: Take 37.5-75 mcg by mouth See admin instructions. Take 1/2 tablet on Tuesday and Thursday then take 1 tablet all the other days 02/22/15   Tammi Sou, MD  ondansetron (ZOFRAN ODT) 4 MG disintegrating tablet Take 1 tablet (4 mg total) by mouth every 8 (eight) hours as needed for nausea or vomiting. 08/06/15   Delice Bison Ward, DO  pravastatin (PRAVACHOL) 40 MG tablet Take 1 tablet (40 mg total) by mouth at bedtime. 02/23/15   Tammi Sou, MD  senna-docusate (SENOKOT-S) 8.6-50 MG tablet Take 1 tablet by mouth at bedtime as needed for mild constipation. Patient taking differently: Take 1 tablet by mouth at bedtime.  03/24/15   Silver Huguenin Elgergawy, MD  traMADol (ULTRAM) 50 MG tablet 1 tab po q6h prn pain Patient taking differently: Take 50 mg by mouth every 6 (six) hours as needed for moderate pain.  06/09/15   Tammi Sou, MD  zolpidem (AMBIEN) 5 MG tablet Take 1 tablet (5 mg total) by mouth at bedtime. 06/01/15   Tammi Sou, MD    Family History Family History  Problem Relation Age of Onset  . Stroke Mother   . Diabetes Mother   . Alcohol abuse Father     Social History Social History  Substance Use Topics  . Smoking status: Never Smoker  . Smokeless tobacco: Never Used  . Alcohol use No     Allergies   Review of patient's allergies indicates no known allergies.   Review of Systems Review of Systems  Constitutional: Negative for fever.  Respiratory: Negative for shortness of breath.   Cardiovascular: Negative for chest pain.  Gastrointestinal: Negative for abdominal pain.  Musculoskeletal: Positive for arthralgias (knee pain). Negative for gait problem.  Neurological: Negative for syncope and headaches.  All other systems reviewed and are negative.  Physical Exam Updated Vital  Signs BP 161/94 (BP Location: Right Arm)   Pulse 90   Resp 18   Wt 115 lb (52.2 kg)   LMP 01/08/1972   SpO2 98%   BMI 19.14 kg/m   Physical Exam  Constitutional: She appears well-developed and well-nourished. No distress.  HENT:  Right Ear: No hemotympanum.  Left Ear: No hemotympanum.  Mouth/Throat: Oropharynx is clear and moist. No oropharyngeal exudate.  Cephalhematoma over forehead  Moist mucous membranes  Nml excursion of jaw   Eyes: Conjunctivae are normal.  Lens implant    Neck: No JVD present.  Trachea midline No bruit  Cardiovascular: Normal rate and regular rhythm.   Murmur heard. Pulses:      Radial pulses are 2+ on the right side, and 2+ on the left side.       Femoral pulses are 2+ on the right side, and 2+ on the  left side. Old 2/6 systolic ejection murmur   Pulmonary/Chest: Effort normal and breath sounds normal. No stridor. No respiratory distress. She has no rales.  Abdominal: Soft. Bowel sounds are normal. She exhibits no distension.  Musculoskeletal: She exhibits no deformity.       Left hip: Normal.       Left knee: She exhibits ecchymosis. She exhibits no effusion, no deformity, no LCL laxity, no bony tenderness, normal meniscus and no MCL laxity. No tenderness found. No medial joint line, no lateral joint line, no MCL, no LCL and no patellar tendon tenderness noted.       Left ankle: Normal. Achilles tendon normal.  Pelvis stable Small conjunctival hemorrhage of right eye  No laxity to varus/valgus stress No anterior or posterior drawer test Quadriceps and patellar tendon intact  No patella alta or baha  All compartments soft  Neurological: She is alert. She has normal reflexes.  Skin: Skin is warm and dry.  Bruising tracks around right eye down right cheek on to right anterior neck some bruising of left cheek tracks minimally down left face and onto left medial neck Demilunar scab on lateral aspect of left knee  Psychiatric: Her behavior is normal.   Nursing note and vitals reviewed.    ED Treatments / Results  DIAGNOSTIC STUDIES:  Oxygen Saturation is 98% on RA, normal by my interpretation.    COORDINATION OF CARE:  11:58 PM Discussed treatment plan with pt and son at bedside and they agreed to plan.  Labs (all labs ordered are listed, but only abnormal results are displayed) Labs Reviewed - No data to display  EKG  EKG Interpretation None       Radiology Dg Knee Complete 4 Views Left  Result Date: 08/09/2015 CLINICAL DATA:  82 year old female with fall and left knee pain EXAM: LEFT KNEE - COMPLETE 4+ VIEW COMPARISON:  None. FINDINGS: There is no acute fracture or dislocation. The bones are osteopenic. There is advanced osteoarthritic changes with severe tricompartmental narrowing and juxta-articular spurring. There is soft tissue swelling over the knee. No significant joint effusion. IMPRESSION: No acute fracture or dislocation. Electronically Signed   By: Anner Crete M.D.   On: 08/09/2015 22:25    Procedures Procedures  11:54 PM Wound care at bedside by me; bacitracin applied   Medications Ordered in ED Medications  acetaminophen (TYLENOL) tablet 1,000 mg (not administered)     Initial Impression / Assessment and Plan / ED Course  I have reviewed the triage vital signs and the nursing notes.  Pertinent labs & imaging results that were available during my care of the patient were reviewed by me and considered in my medical decision making (see chart for details).  Clinical Course    Vitals:   08/09/15 2135  BP: 161/94  Pulse: 90  Resp: 18     Final Clinical Impressions(s) / ED Diagnoses   Final diagnoses:  None    New Prescriptions New Prescriptions   No medications on file   Results for orders placed or performed during the hospital encounter of 08/06/15  Urinalysis, Routine w reflex microscopic (not at [facility])  Result Value Ref Range   Color, Urine YELLOW YELLOW   APPearance CLOUDY (A)  CLEAR   Specific Gravity, Urine 1.010 1.005 - 1.030   pH 6.0 5.0 - 8.0   Glucose, UA NEGATIVE NEGATIVE mg/dL   Hgb urine dipstick NEGATIVE NEGATIVE   Bilirubin Urine NEGATIVE NEGATIVE   Ketones, ur NEGATIVE NEGATIVE mg/dL  Protein, ur NEGATIVE NEGATIVE mg/dL   Nitrite NEGATIVE NEGATIVE   Leukocytes, UA TRACE (A) NEGATIVE  CBC with Differential  Result Value Ref Range   WBC 9.0 4.0 - 10.5 K/uL   RBC 3.75 (L) 3.87 - 5.11 MIL/uL   Hemoglobin 10.7 (L) 12.0 - 15.0 g/dL   HCT 33.2 (L) 36.0 - 46.0 %   MCV 88.5 78.0 - 100.0 fL   MCH 28.5 26.0 - 34.0 pg   MCHC 32.2 30.0 - 36.0 g/dL   RDW 16.2 (H) 11.5 - 15.5 %   Platelets 282 150 - 400 K/uL   Neutrophils Relative % 75 %   Neutro Abs 6.7 1.7 - 7.7 K/uL   Lymphocytes Relative 11 %   Lymphs Abs 1.0 0.7 - 4.0 K/uL   Monocytes Relative 11 %   Monocytes Absolute 1.0 0.1 - 1.0 K/uL   Eosinophils Relative 3 %   Eosinophils Absolute 0.2 0.0 - 0.7 K/uL   Basophils Relative 0 %   Basophils Absolute 0.0 0.0 - 0.1 K/uL  Basic metabolic panel  Result Value Ref Range   Sodium 133 (L) 135 - 145 mmol/L   Potassium 3.6 3.5 - 5.1 mmol/L   Chloride 97 (L) 101 - 111 mmol/L   CO2 26 22 - 32 mmol/L   Glucose, Bld 125 (H) 65 - 99 mg/dL   BUN 25 (H) 6 - 20 mg/dL   Creatinine, Ser 1.44 (H) 0.44 - 1.00 mg/dL   Calcium 8.7 (L) 8.9 - 10.3 mg/dL   GFR calc non Af Amer 30 (L) >60 mL/min   GFR calc Af Amer 35 (L) >60 mL/min   Anion gap 10 5 - 15  Urine microscopic-add on  Result Value Ref Range   Squamous Epithelial / LPF 6-30 (A) NONE SEEN   WBC, UA 0-5 0 - 5 WBC/hpf   RBC / HPF 0-5 0 - 5 RBC/hpf   Bacteria, UA MANY (A) NONE SEEN   Dg Ankle Complete Right  Result Date: 08/04/2015 CLINICAL DATA:  Acute right ankle pain following fall. Initial encounter. EXAM: RIGHT ANKLE - COMPLETE 3+ VIEW COMPARISON:  None. FINDINGS: There is no evidence of acute fracture, subluxation or dislocation. Mild soft tissue swelling identified. No focal bony lesions are  identified. IMPRESSION: Mild soft tissue swelling without acute bony abnormality. Electronically Signed   By: Margarette Canada M.D.   On: 08/04/2015 18:15  Ct Head Wo Contrast  Result Date: 08/06/2015 CLINICAL DATA:  82 year old female with fall and trauma to the head EXAM: CT HEAD WITHOUT CONTRAST CT CERVICAL SPINE WITHOUT CONTRAST TECHNIQUE: Multidetector CT imaging of the head and cervical spine was performed following the standard protocol without intravenous contrast. Multiplanar CT image reconstructions of the cervical spine were also generated. COMPARISON:  Head CT dated 06/05/2015 FINDINGS: CT HEAD FINDINGS There is moderate age-related atrophy and chronic microvascular ischemic changes. Old left occipital infarct and mild ex vacuo dilatation of the occipital lobe of the left lateral ventricle. There is no acute intracranial hemorrhage. No mass effect or midline shift noted. There is irregularity of the nasal bone concerning for an acute fracture. Correlation with clinical exam recommended. There is soft tissue swelling over the nose as well as periorbital hematoma. Large scalp hematoma over the forehead and right frontal calvarium. CT CERVICAL SPINE FINDINGS There is no acute fracture or subluxation of the cervical spine.There is osteopenia with multilevel degenerative changes. C4-C5 and C7-T1 grade 1 anterolisthesis.The odontoid and spinous processes are intact.There is normal anatomic alignment  of the C1-C2 lateral masses. The visualized soft tissues appear unremarkable. Bilateral thyroid masses measuring up to 5 cm and extending into the upper mediastinum. IMPRESSION: No acute intracranial hemorrhage. Age-related atrophy and chronic microvascular ischemic disease. No acute/traumatic cervical spine pathology. Nasal bone fractures. Electronically Signed   By: Anner Crete M.D.   On: 08/06/2015 03:51  Ct Cervical Spine Wo Contrast  Result Date: 08/06/2015 CLINICAL DATA:  82 year old female with fall  and trauma to the head EXAM: CT HEAD WITHOUT CONTRAST CT CERVICAL SPINE WITHOUT CONTRAST TECHNIQUE: Multidetector CT imaging of the head and cervical spine was performed following the standard protocol without intravenous contrast. Multiplanar CT image reconstructions of the cervical spine were also generated. COMPARISON:  Head CT dated 06/05/2015 FINDINGS: CT HEAD FINDINGS There is moderate age-related atrophy and chronic microvascular ischemic changes. Old left occipital infarct and mild ex vacuo dilatation of the occipital lobe of the left lateral ventricle. There is no acute intracranial hemorrhage. No mass effect or midline shift noted. There is irregularity of the nasal bone concerning for an acute fracture. Correlation with clinical exam recommended. There is soft tissue swelling over the nose as well as periorbital hematoma. Large scalp hematoma over the forehead and right frontal calvarium. CT CERVICAL SPINE FINDINGS There is no acute fracture or subluxation of the cervical spine.There is osteopenia with multilevel degenerative changes. C4-C5 and C7-T1 grade 1 anterolisthesis.The odontoid and spinous processes are intact.There is normal anatomic alignment of the C1-C2 lateral masses. The visualized soft tissues appear unremarkable. Bilateral thyroid masses measuring up to 5 cm and extending into the upper mediastinum. IMPRESSION: No acute intracranial hemorrhage. Age-related atrophy and chronic microvascular ischemic disease. No acute/traumatic cervical spine pathology. Nasal bone fractures. Electronically Signed   By: Anner Crete M.D.   On: 08/06/2015 03:51  Dg Knee Complete 4 Views Left  Result Date: 08/09/2015 CLINICAL DATA:  82 year old female with fall and left knee pain EXAM: LEFT KNEE - COMPLETE 4+ VIEW COMPARISON:  None. FINDINGS: There is no acute fracture or dislocation. The bones are osteopenic. There is advanced osteoarthritic changes with severe tricompartmental narrowing and  juxta-articular spurring. There is soft tissue swelling over the knee. No significant joint effusion. IMPRESSION: No acute fracture or dislocation. Electronically Signed   By: Anner Crete M.D.   On: 08/09/2015 22:25   Dg Foot Complete Left  Result Date: 08/04/2015 CLINICAL DATA:  Acute left foot pain following fall. Initial encounter. EXAM: LEFT FOOT - COMPLETE 3+ VIEW COMPARISON:  None. FINDINGS: There is no evidence of acute fracture, subluxation or dislocation. Hallux valgus and bunion noted. Degenerative changes of the second toe PIP joint noted. IMPRESSION: No evidence of acute bony abnormality. Electronically Signed   By: Margarette Canada M.D.   On: 08/04/2015 18:18  Dg Foot Complete Right  Result Date: 08/04/2015 CLINICAL DATA:  Fall with right foot pain.  Initial encounter. EXAM: RIGHT FOOT COMPLETE - 3+ VIEW COMPARISON:  None. FINDINGS: There is no evidence of acute fracture. Internal plate and screw fixation across the first MTP joint noted. Degenerative changes in the midfoot are present. Chronic appearing dislocation at the little toe PIP joint noted. IMPRESSION: No evidence of acute abnormality. Electronically Signed   By: Margarette Canada M.D.   On: 08/04/2015 18:14  Dg Hip Unilat W Or Wo Pelvis 2-3 Views Left  Result Date: 08/04/2015 CLINICAL DATA:  Acute left hip pain following fall. Initial encounter. EXAM: DG HIP (WITH OR WITHOUT PELVIS) 2-3V LEFT COMPARISON:  03/22/2015 and  prior studies FINDINGS: There is no evidence of acute fracture, subluxation or dislocation. Internal fixation of remote proximal right femur fracture again noted. No focal bony lesions are identified. IMPRESSION: No acute abnormality. Electronically Signed   By: Margarette Canada M.D.   On: 08/04/2015 18:17  Medications  acetaminophen (TYLENOL) tablet 1,000 mg (1,000 mg Oral Given 08/10/15 0033)    All questions answered to patient's satisfaction. Based on history and exam patient has been appropriately medically screened  and emergency conditions excluded. Patient is stable for discharge at this time. Follow up with your PMDfor recheck in 2 daysand strict return precautions given.  I personally performed the services described in this documentation, which was scribed in my presence. The recorded information has been reviewed and is accurate.     Veatrice Kells, MD 08/10/15 [id number]

## 2015-08-09 NOTE — ED Triage Notes (Signed)
Son reports fall  X 6 hrs at [facility] c/o left knee pain and swelling. Recent fall x 3 days ago injury face and head.

## 2015-08-09 NOTE — ED Notes (Signed)
EDP in to see pt, at BS.  °

## 2015-08-09 NOTE — ED Notes (Signed)
Patient transported to X-ray 

## 2015-08-10 ENCOUNTER — Encounter (HOSPITAL_BASED_OUTPATIENT_CLINIC_OR_DEPARTMENT_OTHER): Payer: Self-pay | Admitting: Emergency Medicine

## 2015-08-10 DIAGNOSIS — I482 Chronic atrial fibrillation: Secondary | ICD-10-CM | POA: Diagnosis not present

## 2015-08-10 DIAGNOSIS — F339 Major depressive disorder, recurrent, unspecified: Secondary | ICD-10-CM | POA: Diagnosis not present

## 2015-08-10 DIAGNOSIS — R2681 Unsteadiness on feet: Secondary | ICD-10-CM | POA: Diagnosis not present

## 2015-08-10 DIAGNOSIS — I129 Hypertensive chronic kidney disease with stage 1 through stage 4 chronic kidney disease, or unspecified chronic kidney disease: Secondary | ICD-10-CM | POA: Diagnosis not present

## 2015-08-10 DIAGNOSIS — S81812A Laceration without foreign body, left lower leg, initial encounter: Secondary | ICD-10-CM | POA: Diagnosis not present

## 2015-08-10 DIAGNOSIS — Z9181 History of falling: Secondary | ICD-10-CM | POA: Diagnosis not present

## 2015-08-10 DIAGNOSIS — N183 Chronic kidney disease, stage 3 (moderate): Secondary | ICD-10-CM | POA: Diagnosis not present

## 2015-08-10 NOTE — ED Notes (Signed)
Son present, reports returning to assisted living, scheduled to change to [facility] on Monday, attempting to prevent falls and increase supervision as best as possible b/w now and Monday. Other family coming in to assist, and for respite. Has had recent SLP, OT and PT. Pt decided she would "do no more". Has been using walker, has been falling when getting up to b/r. Son OK with results, care and d/c plan.

## 2015-08-10 NOTE — ED Notes (Signed)
Pt seen and dispositioned by EDP prior to RN assessment, see MD notes, pt up to b/r with RN assistance at time of d/c, large BM resulted, "feel better with knee sleeve on", no s/sx of pain at this time, L knee swollen, CMS intact, pedal pulses present.

## 2015-08-11 DIAGNOSIS — N183 Chronic kidney disease, stage 3 (moderate): Secondary | ICD-10-CM | POA: Diagnosis not present

## 2015-08-11 DIAGNOSIS — I482 Chronic atrial fibrillation: Secondary | ICD-10-CM | POA: Diagnosis not present

## 2015-08-11 DIAGNOSIS — I129 Hypertensive chronic kidney disease with stage 1 through stage 4 chronic kidney disease, or unspecified chronic kidney disease: Secondary | ICD-10-CM | POA: Diagnosis not present

## 2015-08-11 DIAGNOSIS — F339 Major depressive disorder, recurrent, unspecified: Secondary | ICD-10-CM | POA: Diagnosis not present

## 2015-08-11 DIAGNOSIS — Z9181 History of falling: Secondary | ICD-10-CM | POA: Diagnosis not present

## 2015-08-11 DIAGNOSIS — R2681 Unsteadiness on feet: Secondary | ICD-10-CM | POA: Diagnosis not present

## 2015-08-14 ENCOUNTER — Non-Acute Institutional Stay (SKILLED_NURSING_FACILITY): Payer: Medicare Other | Admitting: Internal Medicine

## 2015-08-14 ENCOUNTER — Encounter: Payer: Self-pay | Admitting: Internal Medicine

## 2015-08-14 DIAGNOSIS — E038 Other specified hypothyroidism: Secondary | ICD-10-CM | POA: Diagnosis not present

## 2015-08-14 DIAGNOSIS — E559 Vitamin D deficiency, unspecified: Secondary | ICD-10-CM | POA: Diagnosis not present

## 2015-08-14 DIAGNOSIS — E034 Atrophy of thyroid (acquired): Secondary | ICD-10-CM | POA: Diagnosis not present

## 2015-08-14 DIAGNOSIS — S72001D Fracture of unspecified part of neck of right femur, subsequent encounter for closed fracture with routine healing: Secondary | ICD-10-CM

## 2015-08-14 DIAGNOSIS — E785 Hyperlipidemia, unspecified: Secondary | ICD-10-CM

## 2015-08-14 DIAGNOSIS — G4700 Insomnia, unspecified: Secondary | ICD-10-CM | POA: Diagnosis not present

## 2015-08-14 DIAGNOSIS — I10 Essential (primary) hypertension: Secondary | ICD-10-CM | POA: Diagnosis not present

## 2015-08-14 DIAGNOSIS — I482 Chronic atrial fibrillation, unspecified: Secondary | ICD-10-CM

## 2015-08-14 NOTE — Progress Notes (Signed)
MRN: [id number] Name: Marie Robertson  Sex: female Age: 82 y.o. DOB: 01/30/1920  [place] #:  Facility/Room: [facility]/ [address] Level Of Care: SNF Provider: Noah Delaine. [name], MD Emergency Contacts: Extended Emergency Contact Information Primary Emergency Contact: [facility] Address: [address]          [place], [place] [address] [place] of [place] Phone: [phone] Relation: Son Secondary Emergency Contact: [place], [place] [address] Johnnette Litter of [place] Phone: [phone] Mobile Phone: [phone] Relation: Son  Code Status: Full Coded  Allergies: Review of patient's allergies indicates no known allergies.  Chief Complaint  Patient presents with  . New Admit To SNF    Admit to Facility    HPI: Patient is 82 y.o. female who is being seen as a new admission to SNF on 8/7, being transferred from ALF. She has been falling lately and has recently hit her head and has a large forehead hematoma. No LOC, or intracranial process.Pt does not c/o HA, weakness, dizzyness or other sx referable to neurologic system. While at SNF pt will be followed for  AF, tx with metoprolol and eliquis, HTN, tx with metoprolol and hypothyroidism, tx with synthroid.  Past Medical History:  Diagnosis Date  . Chronic atrial fibrillation ([place])   . Chronic renal insufficiency, stage III (moderate)   . Constipation   . CVA (cerebral infarction) approx 2012   residual defect: poor R sided peripheral vision per son  . Depression   . History of fracture of right hip 03/2015  . Hyperlipidemia   . Hypertension   . Hypothyroidism   . Insomnia   . Osteoarthritis    wrists, hands  . Osteoporosis     Past Surgical History:  Procedure Laterality Date  . APPENDECTOMY    . COLONOSCOPY  08/22/08  . INTRAMEDULLARY (IM) NAIL INTERTROCHANTERIC Right 03/22/2015   Procedure: RIGHT femoral HIP NAILING;  Surgeon: Gaynelle Arabian, MD;  Location: [facility];  Service: Orthopedics;   Laterality: Right;  . KNEE ARTHROSCOPY Right   . THYROID SURGERY        Medication List       Accurate as of 08/14/15 11:59 PM. Always use your most recent med list.          acetaminophen 500 MG tablet Commonly known as:  TYLENOL Take 2 tablets (1,000 mg total) by mouth every 6 (six) hours as needed for mild pain, moderate pain or headache.   atenolol 50 MG tablet Commonly known as:  TENORMIN Take 50 mg by mouth daily.   bacitracin ointment Apply 1 application topically 2 (two) times daily. Apply to facial abrasions for one week   ELIQUIS 2.5 MG Tabs tablet Generic drug:  apixaban Take 2.5 mg by mouth 2 (two) times daily.   HYDROcodone-acetaminophen 5-325 MG tablet Commonly known as:  NORCO/VICODIN Take 1-2 tablets by mouth every 6 (six) hours as needed for moderate pain.   levothyroxine 75 MCG tablet Commonly known as:  SYNTHROID, LEVOTHROID Take 1 tablet (75 mcg total) by mouth daily.   pravastatin 40 MG tablet Commonly known as:  PRAVACHOL Take 1 tablet (40 mg total) by mouth at bedtime.   senna-docusate 8.6-50 MG tablet Commonly known as:  Senokot-S Take 1 tablet by mouth at bedtime as needed for mild constipation.   traMADol 50 MG tablet Commonly known as:  ULTRAM 1 tab po q6h prn pain   Vitamin D-3 1000 units Caps Take 3,000 Units  by mouth daily.   zolpidem 5 MG tablet Commonly known as:  AMBIEN Take 1 tablet (5 mg total) by mouth at bedtime.       No orders of the defined types were placed in this encounter.   Immunization History  Administered Date(s) Administered  . Influenza Split 11/20/2012  . Pneumococcal Conjugate-13 05/22/2015  . Pneumococcal Polysaccharide-23 08/22/2008  . Tdap 01/01/2015, 08/06/2015    Social History  Substance Use Topics  . Smoking status: Never Smoker  . Smokeless tobacco: Never Used  . Alcohol use No    Family history is   Family History  Problem Relation Age of Onset  . Stroke Mother   . Diabetes  Mother   . Alcohol abuse Father       Review of Systems  DATA OBTAINED: from patient GENERAL:  no fevers, fatigue, appetite changes SKIN: bruising forehead EYES: No eye pain, redness, discharge EARS: No earache, tinnitus, change in hearing NOSE: No congestion, drainage or bleeding  MOUTH/THROAT: No mouth or tooth pain, No sore throat RESPIRATORY: No cough, wheezing, SOB CARDIAC: No chest pain, palpitations, lower extremity edema  GI: No abdominal pain, No N/V/D or constipation, No heartburn or reflux  GU: No dysuria, frequency or urgency, or incontinence  MUSCULOSKELETAL: No unrelieved bone/joint pain NEUROLOGIC: No headache, dizziness or focal weakness PSYCHIATRIC: No c/o anxiety or sadness   Vitals:   08/14/15 1509  BP: 122/77  Pulse: 90  Resp: 18  Temp: 97 F (36.1 C)    SpO2 Readings from Last 1 Encounters:  08/10/15 98%        Physical Exam  GENERAL APPEARANCE: Alert, conversant,  No acute distress.  SKIN: No diaphoresis rash HEAD: Normocephalic, Very large hematoma R forehead with dependent bruising on face, nack and proximal chest EYES: Conjunctiva/lids clear. Pupils round, reactive. EOMs intact.  EARS: External exam WNL, canals clear. Hearing grossly normal.  NOSE: No deformity or discharge.  MOUTH/THROAT: Lips w/o lesions  RESPIRATORY: Breathing is even, unlabored. Lung sounds are clear   CARDIOVASCULAR: Heart RRR no murmurs, rubs or gallops. No peripheral edema.   GASTROINTESTINAL: Abdomen is soft, non-tender, not distended w/ normal bowel sounds. GENITOURINARY: Bladder non tender, not distended  MUSCULOSKELETAL: No abnormal joints or musculature NEUROLOGIC:  Cranial nerves 2-12 grossly intact. Moves all extremities  PSYCHIATRIC: Mood and affect appropriate to situation, no behavioral issues  Patient Active Problem List   Diagnosis Date Noted  . Insomnia 08/18/2015  . Confusion 06/20/2015  . Recent urinary tract infection 06/20/2015  . Dysuria  04/10/2015  . Edema 04/06/2015  . Acute encephalopathy 03/29/2015  . Vitamin D deficiency 03/29/2015  . UTI (lower urinary tract infection) 03/21/2015  . Hip fracture ([place]) 03/20/2015  . Chronic atrial fibrillation ([place]) 02/20/2015  . Hyperlipidemia 02/20/2015  . Essential hypertension, benign 12/13/2012  . Cardiac arrhythmia 12/13/2012  . Urinary incontinence 12/13/2012  . Hypothyroidism 12/13/2012  . History of depression 12/11/2012       Component Value Date/Time   WBC 9.0 08/06/2015 0220   RBC 3.75 (L) 08/06/2015 0220   HGB 10.7 (L) 08/06/2015 0220   HCT 33.2 (L) 08/06/2015 0220   PLT 282 08/06/2015 0220   MCV 88.5 08/06/2015 0220   LYMPHSABS 1.0 08/06/2015 0220   MONOABS 1.0 08/06/2015 0220   EOSABS 0.2 08/06/2015 0220   BASOSABS 0.0 08/06/2015 0220        Component Value Date/Time   NA 133 (L) 08/06/2015 0220   NA 135 (A) 04/04/2015   K  3.6 08/06/2015 0220   CL 97 (L) 08/06/2015 0220   CO2 26 08/06/2015 0220   GLUCOSE 125 (H) 08/06/2015 0220   BUN 25 (H) 08/06/2015 0220   BUN 28 (A) 04/04/2015   CREATININE 1.44 (H) 08/06/2015 0220   CALCIUM 8.7 (L) 08/06/2015 0220   PROT 6.4 (L) 03/20/2015 1700   ALBUMIN 3.5 03/20/2015 1700   AST 27 03/20/2015 1700   ALT 15 03/20/2015 1700   ALKPHOS 51 03/20/2015 1700   BILITOT 0.8 03/20/2015 1700   GFRNONAA 30 (L) 08/06/2015 0220   GFRAA 35 (L) 08/06/2015 0220    No results found for: HGBA1C  No results found for: CHOL, HDL, LDLCALC, LDLDIRECT, TRIG, CHOLHDL   Dg Knee Complete 4 Views Left  Result Date: 08/09/2015 CLINICAL DATA:  82 year old female with fall and left knee pain EXAM: LEFT KNEE - COMPLETE 4+ VIEW COMPARISON:  None. FINDINGS: There is no acute fracture or dislocation. The bones are osteopenic. There is advanced osteoarthritic changes with severe tricompartmental narrowing and juxta-articular spurring. There is soft tissue swelling over the knee. No significant joint effusion. IMPRESSION: No acute fracture  or dislocation. Electronically Signed   By: Anner Crete M.D.   On: 08/09/2015 22:25    Not all labs, radiology exams or other studies done during hospitalization come through on my EPIC note; however they are reviewed by me.    Assessment and Plan  Essential hypertension, benign Controlled on tenormin 50 mg daily; cont current med  Chronic atrial fibrillation (HCC) Rate controlled with tenormin 50 mg; prophylaxed with eliquis 2.5 mg BID  Hypothyroidism Last TSH was low but it was felt that pt was taking a 50 mcg tablet and a 75 mcg tablet together; pt taking only 75 mcg daily now ; will recheck TSH  Hip fracture (HCC) 5 months ago; pt doing fine, walks with walker  Hyperlipidemia Cont pravachol 40 mg daily; wil order FLP, can't find one on chart  Vitamin D deficiency Cont replacement 1000 u , 3 tabs daily; willobtain a Vit D level  Insomnia Cont ambien 5 mg daily prn    Time spent > 45 min;> 50% of time with patient was spent reviewing records, labs, tests and studies, counseling and developing plan of care  Webb Silversmith D. [name], MD

## 2015-08-18 ENCOUNTER — Encounter: Payer: Self-pay | Admitting: Internal Medicine

## 2015-08-18 DIAGNOSIS — G4700 Insomnia, unspecified: Secondary | ICD-10-CM | POA: Insufficient documentation

## 2015-08-18 NOTE — Assessment & Plan Note (Signed)
Last TSH was low but it was felt that pt was taking a 50 mcg tablet and a 75 mcg tablet together; pt taking only 75 mcg daily now ; will recheck TSH

## 2015-08-18 NOTE — Assessment & Plan Note (Signed)
Rate controlled with tenormin 50 mg; prophylaxed with eliquis 2.5 mg BID

## 2015-08-18 NOTE — Assessment & Plan Note (Signed)
Cont pravachol 40 mg daily; wil order FLP, can't find one on chart

## 2015-08-18 NOTE — Assessment & Plan Note (Signed)
Cont ambien 5 mg daily prn

## 2015-08-18 NOTE — Assessment & Plan Note (Signed)
5 months ago; pt doing fine, walks with walker

## 2015-08-18 NOTE — Assessment & Plan Note (Signed)
Controlled on tenormin 50 mg daily; cont current med

## 2015-08-18 NOTE — Assessment & Plan Note (Signed)
Cont replacement 1000 u , 3 tabs daily; willobtain a Vit D level

## 2015-08-21 DIAGNOSIS — I10 Essential (primary) hypertension: Secondary | ICD-10-CM | POA: Diagnosis not present

## 2015-08-22 ENCOUNTER — Encounter: Payer: Self-pay | Admitting: Family Medicine

## 2015-08-22 ENCOUNTER — Ambulatory Visit (INDEPENDENT_AMBULATORY_CARE_PROVIDER_SITE_OTHER): Payer: Medicare Other | Admitting: Family Medicine

## 2015-08-22 VITALS — BP 147/99 | HR 77 | Temp 98.3°F | Resp 16 | Ht 61.0 in | Wt 116.0 lb

## 2015-08-22 DIAGNOSIS — S0083XD Contusion of other part of head, subsequent encounter: Secondary | ICD-10-CM | POA: Diagnosis not present

## 2015-08-22 DIAGNOSIS — S8002XD Contusion of left knee, subsequent encounter: Secondary | ICD-10-CM | POA: Diagnosis not present

## 2015-08-22 NOTE — Progress Notes (Signed)
OFFICE VISIT  08/22/2015   CC:  Chief Complaint  Patient presents with  . Follow-up    Pt is fasting.      HPI:    Patient is a 82 y.o. Caucasian female who presents for f/u ED visit for recent falls.  Reviewed ED record from 08/09/15 (knee contusion) and from 08/06/15 (forehead contusion and nasal fracture). On 08/06/15 she fell in her bathroom in middle of night--says she slipped, hit forehead and got a large hematoma that son says covered her entire forehead--apparently appears much better now.  Imaging showed nasal fracture but CT head and neck o/w no acute findings.   Then on 08/09/15 she fell again and hit knees.  X-rays showed no acute abnormality.  She denies HA.  Left knee hurts when she touches it or tries to walk on it but this is gradually improving as well, as is L knee swelling sustained as a result contusion to L knee during one of her recent falls. No n/v, no focal weakness.  Son says she is at her baseline level of dementia.  She has moved to [facility]. She is changing MD care to Dr. [name] at her ALF, so today's visit will be her last with me.  She has already seen Dr. [name] once and I reviewed her note in our EMR.    Past Medical History:  Diagnosis Date  . Chronic atrial fibrillation ([place])   . Chronic renal insufficiency, stage III (moderate)   . Constipation   . CVA (cerebral infarction) approx 2012   residual defect: poor R sided peripheral vision per son  . Depression   . History of fracture of right hip 03/2015  . Hyperlipidemia   . Hypertension   . Hypothyroidism   . Insomnia   . Osteoarthritis    wrists, hands  . Osteoporosis     Past Surgical History:  Procedure Laterality Date  . APPENDECTOMY    . COLONOSCOPY  08/22/08  . INTRAMEDULLARY (IM) NAIL INTERTROCHANTERIC Right 03/22/2015   Procedure: RIGHT femoral HIP NAILING;  Surgeon: Gaynelle Arabian, MD;  Location: [facility];  Service: Orthopedics;  Laterality: Right;  . KNEE  ARTHROSCOPY Right   . THYROID SURGERY      Outpatient Medications Prior to Visit  Medication Sig Dispense Refill  . acetaminophen (TYLENOL) 500 MG tablet Take 2 tablets (1,000 mg total) by mouth every 6 (six) hours as needed for mild pain, moderate pain or headache. 30 tablet 0  . atenolol (TENORMIN) 50 MG tablet Take 50 mg by mouth daily.    . Cholecalciferol (VITAMIN D-3) 1000 units CAPS Take 3,000 Units by mouth daily.     Marland Kitchen ELIQUIS 2.5 MG TABS tablet Take 2.5 mg by mouth 2 (two) times daily.  0  . levothyroxine (SYNTHROID, LEVOTHROID) 75 MCG tablet Take 1 tablet (75 mcg total) by mouth daily. 30 tablet 3  . pravastatin (PRAVACHOL) 40 MG tablet Take 1 tablet (40 mg total) by mouth at bedtime. 30 tablet 1  . senna-docusate (SENOKOT-S) 8.6-50 MG tablet Take 1 tablet by mouth at bedtime as needed for mild constipation.    . traMADol (ULTRAM) 50 MG tablet 1 tab po q6h prn pain 60 tablet 5  . zolpidem (AMBIEN) 5 MG tablet Take 1 tablet (5 mg total) by mouth at bedtime. 30 tablet 5  . bacitracin ointment Apply 1 application topically 2 (two) times daily. Apply to facial abrasions for one week (Patient not taking: Reported on 08/22/2015) 120  g 0  . HYDROcodone-acetaminophen (NORCO/VICODIN) 5-325 MG tablet Take 1-2 tablets by mouth every 6 (six) hours as needed for moderate pain. (Patient not taking: Reported on 08/22/2015) 80 tablet 0   No facility-administered medications prior to visit.     No Known Allergies  ROS As per HPI  PE: Blood pressure (!) 147/99, pulse 77, temperature 98.3 F (36.8 C), temperature source Oral, resp. rate 16, height 5\' 1"  (1.549 m), weight 116 lb (52.6 kg), last menstrual period 01/08/1972, SpO2 95 %. Gen: Alert, well appearing.  Patient is oriented to person, place, situation. AFFECT: pleasant, lucid thought and speech. Forehead with large violaceous-colored contusion/swelling on R side, soft and nontender. EOMI, PERRL. CV: Irreg irreg, rate around 123XX123, soft  systolic murmur.  No rub. LUNGS: CTA bilat, nonlabored resps. EXT: L knee with diffuse warmth and soft tissue swelling on inferolateral aspect, mildly TTP, decreased ROM.  LABS:  Lab Results  Component Value Date   TSH 0.13 (L) 05/22/2015   Lab Results  Component Value Date   WBC 9.0 08/06/2015   HGB 10.7 (L) 08/06/2015   HCT 33.2 (L) 08/06/2015   MCV 88.5 08/06/2015   PLT 282 08/06/2015   Lab Results  Component Value Date   CREATININE 1.44 (H) 08/06/2015   BUN 25 (H) 08/06/2015   NA 133 (L) 08/06/2015   K 3.6 08/06/2015   CL 97 (L) 08/06/2015   CO2 26 08/06/2015   Lab Results  Component Value Date   ALT 15 03/20/2015   AST 27 03/20/2015   ALKPHOS 51 03/20/2015   BILITOT 0.8 03/20/2015    IMPRESSION AND PLAN:  1) Forehead contusion and L knee contusion, subsequent encounters: f/u ED visit for recent falls. These are gradually improving, as expected.  It appears she has continued on her low dose eliquis as far as I can tell from the records from the ED and her recent new PCP, Dr. [name]. No new recommendations at this time. Dr. [name] will assume full care from here on out. She is welcome back if her situation changes in the future.  An After Visit Summary was printed and given to the patient.  FOLLOW UP: Return for No appointment needed b/c pt is changing physicians.  Signed:  Crissie Sickles, MD           08/22/2015

## 2015-08-22 NOTE — Progress Notes (Signed)
Pre visit review using our clinic review tool, if applicable. No additional management support is needed unless otherwise documented below in the visit note. 

## 2015-08-23 LAB — CBC AND DIFFERENTIAL
HEMATOCRIT: 28 % — AB (ref 36–46)
HEMOGLOBIN: 9.1 g/dL — AB (ref 12.0–16.0)
Platelets: 383 10*3/uL (ref 150–399)
WBC: 6.2 10*3/mL

## 2015-08-23 LAB — BASIC METABOLIC PANEL
BUN: 25 mg/dL — AB (ref 4–21)
Creatinine: 1.4 mg/dL — AB (ref 0.5–1.1)
GLUCOSE: 85 mg/dL
Potassium: 3.7 mmol/L (ref 3.4–5.3)
SODIUM: 141 mmol/L (ref 137–147)

## 2015-08-24 LAB — LIPID PANEL
CHOLESTEROL: 107 mg/dL (ref 0–200)
HDL: 55 mg/dL (ref 35–70)
LDL Cholesterol: 37 mg/dL
Triglycerides: 77 mg/dL (ref 40–160)

## 2015-08-24 LAB — TSH: TSH: 0.07 u[IU]/mL — AB (ref 0.41–5.90)

## 2015-08-24 LAB — HEMOGLOBIN A1C: Hemoglobin A1C: 5.3

## 2015-08-28 ENCOUNTER — Encounter: Payer: Self-pay | Admitting: Internal Medicine

## 2015-08-28 ENCOUNTER — Non-Acute Institutional Stay (SKILLED_NURSING_FACILITY): Payer: Medicare Other | Admitting: Internal Medicine

## 2015-08-28 DIAGNOSIS — E0590 Thyrotoxicosis, unspecified without thyrotoxic crisis or storm: Secondary | ICD-10-CM | POA: Diagnosis not present

## 2015-08-28 NOTE — Progress Notes (Signed)
MRN: [id number] Name: Marie Robertson  Sex: female Age: 82 y.o. DOB: [birth year]  [place] #:  [facility] Level Of Care: SNF Provider: Inocencio Homes D Emergency Contacts: Extended Emergency Contact Information Primary Emergency Contact: [facility] Address: [address]          [place], [place] [address] [place] of [place] Phone: [phone] Relation: Son Secondary Emergency Contact: [place],  [address] Johnnette Litter of [place] Phone: [phone] Mobile Phone: [phone] Relation: Son  Code Status:   Allergies: Review of patient's allergies indicates no known allergies.  Chief Complaint  Patient presents with  . Acute Visit    HPI: Patient is 82 y.o. female who I am seeing today for an abnormal lab with medication change. Pt is new to facility and on routine testing her TSH was 0.07. Pt denies diarrhea, wakefullness, nervousness, palpatations, dry skin.  Past Medical History:  Diagnosis Date  . Chronic atrial fibrillation ([place])   . Chronic renal insufficiency, stage III (moderate)   . Constipation   . CVA (cerebral infarction) approx 2012   residual defect: poor R sided peripheral vision per son  . Depression   . History of fracture of right hip 03/2015  . Hyperlipidemia   . Hypertension   . Hypothyroidism   . Insomnia   . Osteoarthritis    wrists, hands  . Osteoporosis     Past Surgical History:  Procedure Laterality Date  . APPENDECTOMY    . COLONOSCOPY  08/22/08  . INTRAMEDULLARY (IM) NAIL INTERTROCHANTERIC Right 03/22/2015   Procedure: RIGHT femoral HIP NAILING;  Surgeon: Gaynelle Arabian, MD;  Location: [facility];  Service: Orthopedics;  Laterality: Right;  . KNEE ARTHROSCOPY Right   . THYROID SURGERY        Medication List       Accurate as of 08/28/15  7:53 PM. Always use your most recent med list.          acetaminophen 500 MG tablet Commonly known as:  TYLENOL Take 2 tablets (1,000 mg total) by  mouth every 6 (six) hours as needed for mild pain, moderate pain or headache.   atenolol 50 MG tablet Commonly known as:  TENORMIN Take 50 mg by mouth daily.   ELIQUIS 2.5 MG Tabs tablet Generic drug:  apixaban Take 2.5 mg by mouth 2 (two) times daily.   levothyroxine 75 MCG tablet Commonly known as:  SYNTHROID, LEVOTHROID Take 1 tablet (75 mcg total) by mouth daily.   ondansetron 4 MG disintegrating tablet Commonly known as:  ZOFRAN-ODT Take 4 mg by mouth every 8 (eight) hours as needed for nausea or vomiting.   pravastatin 40 MG tablet Commonly known as:  PRAVACHOL Take 1 tablet (40 mg total) by mouth at bedtime.   senna-docusate 8.6-50 MG tablet Commonly known as:  Senokot-S Take 1 tablet by mouth at bedtime as needed for mild constipation.   traMADol 50 MG tablet Commonly known as:  ULTRAM 1 tab po q6h prn pain   Vitamin D-3 1000 units Caps Take 3,000 Units by mouth daily.   zolpidem 5 MG tablet Commonly known as:  AMBIEN Take 1 tablet (5 mg total) by mouth at bedtime.       No orders of the defined types were placed in this encounter.   Immunization History  Administered Date(s) Administered  . Influenza Split 11/20/2012  . Pneumococcal Conjugate-13 05/22/2015  . Pneumococcal Polysaccharide-23 08/22/2008  . Tdap 01/01/2015, 08/06/2015  Social History  Substance Use Topics  . Smoking status: Never Smoker  . Smokeless tobacco: Never Used  . Alcohol use No    Review of Systems  DATA OBTAINED: from patient- as per HPI GENERAL:  no fevers, fatigue, appetite changes SKIN: No itching, rash HEENT: No complaint RESPIRATORY: No cough, wheezing, SOB CARDIAC: No chest pain, palpitations, lower extremity edema  GI: No abdominal pain, No N/V/D or constipation, No heartburn or reflux  GU: No dysuria, frequency or urgency, or incontinence  MUSCULOSKELETAL: No unrelieved bone/joint pain NEUROLOGIC: No headache, dizziness  PSYCHIATRIC: No overt anxiety or  sadness  Vitals:   08/28/15 1717  BP: 122/77  Pulse: 80  Resp: 20  Temp: 97 F (36.1 C)    Physical Exam  GENERAL APPEARANCE: Alert, conversant, No acute distress  SKIN: No diaphoresis rash HEENT: Unremarkable RESPIRATORY: Breathing is even, unlabored. Lung sounds are clear   CARDIOVASCULAR: Heart RRR no murmurs, rubs or gallops. No peripheral edema  GASTROINTESTINAL: Abdomen is soft, non-tender, not distended w/ normal bowel sounds.  GENITOURINARY: Bladder non tender, not distended  MUSCULOSKELETAL: No abnormal joints or musculature NEUROLOGIC: Cranial nerves 2-12 grossly intact. Moves all extremities PSYCHIATRIC: Mood and affect appropriate to situation, no behavioral issues  Patient Active Problem List   Diagnosis Date Noted  . Insomnia 08/18/2015  . Confusion 06/20/2015  . Recent urinary tract infection 06/20/2015  . Dysuria 04/10/2015  . Edema 04/06/2015  . Acute encephalopathy 03/29/2015  . Vitamin D deficiency 03/29/2015  . UTI (lower urinary tract infection) 03/21/2015  . Hip fracture ([place]) 03/20/2015  . Chronic atrial fibrillation ([place]) 02/20/2015  . Hyperlipidemia 02/20/2015  . Essential hypertension, benign 12/13/2012  . Cardiac arrhythmia 12/13/2012  . Urinary incontinence 12/13/2012  . Hypothyroidism 12/13/2012  . History of depression 12/11/2012    CBC    Component Value Date/Time   WBC 9.0 08/06/2015 0220   RBC 3.75 (L) 08/06/2015 0220   HGB 10.7 (L) 08/06/2015 0220   HCT 33.2 (L) 08/06/2015 0220   PLT 282 08/06/2015 0220   MCV 88.5 08/06/2015 0220   LYMPHSABS 1.0 08/06/2015 0220   MONOABS 1.0 08/06/2015 0220   EOSABS 0.2 08/06/2015 0220   BASOSABS 0.0 08/06/2015 0220    CMP     Component Value Date/Time   NA 133 (L) 08/06/2015 0220   NA 135 (A) 04/04/2015   K 3.6 08/06/2015 0220   CL 97 (L) 08/06/2015 0220   CO2 26 08/06/2015 0220   GLUCOSE 125 (H) 08/06/2015 0220   BUN 25 (H) 08/06/2015 0220   BUN 28 (A) 04/04/2015   CREATININE  1.44 (H) 08/06/2015 0220   CALCIUM 8.7 (L) 08/06/2015 0220   PROT 6.4 (L) 03/20/2015 1700   ALBUMIN 3.5 03/20/2015 1700   AST 27 03/20/2015 1700   ALT 15 03/20/2015 1700   ALKPHOS 51 03/20/2015 1700   BILITOT 0.8 03/20/2015 1700   GFRNONAA 30 (L) 08/06/2015 0220   GFRAA 35 (L) 08/06/2015 0220    Assessment and Plan  HYPERTHYROIDISM, iatrogenic- will decrease synthroid from 75 mcg to 50 mcg and retest TSH in 6 weeks.    [name], MD

## 2015-09-08 DIAGNOSIS — M6281 Muscle weakness (generalized): Secondary | ICD-10-CM | POA: Diagnosis not present

## 2015-09-08 DIAGNOSIS — R488 Other symbolic dysfunctions: Secondary | ICD-10-CM | POA: Diagnosis not present

## 2015-09-08 DIAGNOSIS — I482 Chronic atrial fibrillation: Secondary | ICD-10-CM | POA: Diagnosis not present

## 2015-09-08 DIAGNOSIS — R2681 Unsteadiness on feet: Secondary | ICD-10-CM | POA: Diagnosis not present

## 2015-09-08 DIAGNOSIS — Z9181 History of falling: Secondary | ICD-10-CM | POA: Diagnosis not present

## 2015-09-14 ENCOUNTER — Encounter: Payer: Self-pay | Admitting: Internal Medicine

## 2015-09-14 ENCOUNTER — Non-Acute Institutional Stay (SKILLED_NURSING_FACILITY): Payer: Medicare Other | Admitting: Internal Medicine

## 2015-09-14 DIAGNOSIS — E785 Hyperlipidemia, unspecified: Secondary | ICD-10-CM

## 2015-09-14 DIAGNOSIS — G4700 Insomnia, unspecified: Secondary | ICD-10-CM | POA: Diagnosis not present

## 2015-09-14 DIAGNOSIS — N183 Chronic kidney disease, stage 3 unspecified: Secondary | ICD-10-CM

## 2015-09-14 NOTE — Progress Notes (Signed)
MRN: [id number] Name: Marie Robertson  Sex: female Age: 82 y.o. DOB: 1920-02-14  [place] #:  Facility/Room: [facility] / 418D Level Of Care: SNF Provider: Noah Delaine. [name], MD Emergency Contacts: Extended Emergency Contact Information Primary Emergency Contact: [facility] Address: [address]          [place], [place] [address] Johnnette Litter of [place] Phone: [phone] Relation: Son Secondary Emergency Contact: [employer], [place] [address] Johnnette Litter of [place] Phone: [phone] Mobile Phone: [phone] Relation: Son  Code Status: DNR  Allergies: Review of patient's allergies indicates no known allergies.  Chief Complaint  Patient presents with  . Medical Management of Chronic Issues    Routine Visit    HPI: Patient is 82 y.o. female who is being seen for routine issues of HLD, CKD3 and insomnia.   Past Medical History:  Diagnosis Date  . Chronic atrial fibrillation ([place])   . Chronic renal insufficiency, stage III (moderate)   . Constipation   . CVA (cerebral infarction) approx 2012   residual defect: poor R sided peripheral vision per son  . Depression   . History of fracture of right hip 03/2015  . Hyperlipidemia   . Hypertension   . Hypothyroidism   . Insomnia   . Osteoarthritis    wrists, hands  . Osteoporosis     Past Surgical History:  Procedure Laterality Date  . APPENDECTOMY    . COLONOSCOPY  08/22/08  . INTRAMEDULLARY (IM) NAIL INTERTROCHANTERIC Right 03/22/2015   Procedure: RIGHT femoral HIP NAILING;  Surgeon: Gaynelle Arabian, MD;  Location: [facility];  Service: Orthopedics;  Laterality: Right;  . KNEE ARTHROSCOPY Right   . THYROID SURGERY        Medication List       Accurate as of 09/14/15 11:59 PM. Always use your most recent med list.          acetaminophen 500 MG tablet Commonly known as:  TYLENOL Take 2 tablets (1,000 mg total) by mouth every 6 (six) hours as needed for mild pain, moderate pain or headache.    atenolol 50 MG tablet Commonly known as:  TENORMIN Take 50 mg by mouth daily.   ELIQUIS 2.5 MG Tabs tablet Generic drug:  apixaban Take 2.5 mg by mouth 2 (two) times daily.   feeding supplement Liqd Take 1 Container by mouth 2 (two) times daily between meals.   levothyroxine 50 MCG tablet Commonly known as:  SYNTHROID, LEVOTHROID Take 50 mcg by mouth daily before breakfast.   ondansetron 4 MG disintegrating tablet Commonly known as:  ZOFRAN-ODT Take 4 mg by mouth every 8 (eight) hours as needed for nausea or vomiting.   pravastatin 40 MG tablet Commonly known as:  PRAVACHOL Take 1 tablet (40 mg total) by mouth at bedtime.   senna 8.6 MG tablet Commonly known as:  SENOKOT Take 1 tablet by mouth at bedtime.   traMADol 50 MG tablet Commonly known as:  ULTRAM 1 tab po q6h prn pain   Vitamin D-3 1000 units Caps Take 3,000 Units by mouth daily.   zolpidem 5 MG tablet Commonly known as:  AMBIEN Take 1 tablet (5 mg total) by mouth at bedtime.       Meds ordered this encounter  Medications  . senna (SENOKOT) 8.6 MG tablet    Sig: Take 1 tablet by mouth at bedtime.  Marland Kitchen levothyroxine (SYNTHROID, LEVOTHROID) 50 MCG tablet    Sig: Take 50 mcg by  mouth daily before breakfast.  . feeding supplement (BOOST / RESOURCE BREEZE) LIQD    Sig: Take 1 Container by mouth 2 (two) times daily between meals.    Immunization History  Administered Date(s) Administered  . Influenza Split 11/20/2012  . Pneumococcal Conjugate-13 05/22/2015  . Pneumococcal Polysaccharide-23 08/22/2008  . Tdap 01/01/2015, 08/06/2015    Social History  Substance Use Topics  . Smoking status: Never Smoker  . Smokeless tobacco: Never Used  . Alcohol use No    Review of Systems  DATA OBTAINED: from patient, nurse GENERAL:  no fevers, fatigue, appetite changes SKIN: No itching, rash HEENT: No complaint RESPIRATORY: No cough, wheezing, SOB CARDIAC: No chest pain, palpitations, lower extremity edema   GI: No abdominal pain, No N/V/D or constipation, No heartburn or reflux  GU: No dysuria, frequency or urgency, or incontinence  MUSCULOSKELETAL: No unrelieved bone/joint pain NEUROLOGIC: No headache, dizziness  PSYCHIATRIC: No overt anxiety or sadness  Vitals:   09/14/15 1019  BP: 124/85  Pulse: 74  Resp: (!) 22  Temp: 98.9 F (37.2 C)    Physical Exam  GENERAL APPEARANCE: Alert, conversant, No acute distress  SKIN: still resolving R forehead hematoma and bruising HEENT: Unremarkable RESPIRATORY: Breathing is even, unlabored. Lung sounds are clear   CARDIOVASCULAR: Heart RRR no murmurs, rubs or gallops. No peripheral edema  GASTROINTESTINAL: Abdomen is soft, non-tender, not distended w/ normal bowel sounds.  GENITOURINARY: Bladder non tender, not distended  MUSCULOSKELETAL: No abnormal joints or musculature NEUROLOGIC: Cranial nerves 2-12 grossly intact. Moves all extremities PSYCHIATRIC: Mood and affect appropriate to situation with some dementia, no behavioral issues  Patient Active Problem List   Diagnosis Date Noted  . CKD (chronic kidney disease) stage 3, GFR 30-59 ml/min 09/17/2015  . Insomnia 08/18/2015  . Confusion 06/20/2015  . Recent urinary tract infection 06/20/2015  . Dysuria 04/10/2015  . Edema 04/06/2015  . Acute encephalopathy 03/29/2015  . Vitamin D deficiency 03/29/2015  . UTI (lower urinary tract infection) 03/21/2015  . Hip fracture ([place]) 03/20/2015  . Chronic atrial fibrillation ([place]) 02/20/2015  . Hyperlipidemia 02/20/2015  . Essential hypertension, benign 12/13/2012  . Cardiac arrhythmia 12/13/2012  . Urinary incontinence 12/13/2012  . Hypothyroidism 12/13/2012  . History of depression 12/11/2012    CBC    Component Value Date/Time   WBC 9.0 08/06/2015 0220   RBC 3.75 (L) 08/06/2015 0220   HGB 10.7 (L) 08/06/2015 0220   HCT 33.2 (L) 08/06/2015 0220   PLT 282 08/06/2015 0220   MCV 88.5 08/06/2015 0220   LYMPHSABS 1.0 08/06/2015 0220    MONOABS 1.0 08/06/2015 0220   EOSABS 0.2 08/06/2015 0220   BASOSABS 0.0 08/06/2015 0220    CMP     Component Value Date/Time   NA 133 (L) 08/06/2015 0220   NA 135 (A) 04/04/2015   K 3.6 08/06/2015 0220   CL 97 (L) 08/06/2015 0220   CO2 26 08/06/2015 0220   GLUCOSE 125 (H) 08/06/2015 0220   BUN 25 (H) 08/06/2015 0220   BUN 28 (A) 04/04/2015   CREATININE 1.44 (H) 08/06/2015 0220   CALCIUM 8.7 (L) 08/06/2015 0220   PROT 6.4 (L) 03/20/2015 1700   ALBUMIN 3.5 03/20/2015 1700   AST 27 03/20/2015 1700   ALT 15 03/20/2015 1700   ALKPHOS 51 03/20/2015 1700   BILITOT 0.8 03/20/2015 1700   GFRNONAA 30 (L) 08/06/2015 0220   GFRAA 35 (L) 08/06/2015 0220    Assessment and Plan  Hyperlipidemia In 08/2015 LDL 37, HDL  6 , well controlled on pravachol 40 mg; plan to cont current med  CKD (chronic kidney disease) stage 3, GFR 30-59 ml/min GFR 30-36; BUN/Cr  25/1.41, appears at baseline; will monitor at intervals  Insomnia No reported problems;plan to cont ambien 5 mg qHS prn   Noah Delaine. [name], MD

## 2015-09-17 ENCOUNTER — Encounter: Payer: Self-pay | Admitting: Internal Medicine

## 2015-09-17 DIAGNOSIS — N183 Chronic kidney disease, stage 3 unspecified: Secondary | ICD-10-CM | POA: Insufficient documentation

## 2015-09-17 HISTORY — DX: Chronic kidney disease, stage 3 unspecified: N18.30

## 2015-09-17 NOTE — Assessment & Plan Note (Signed)
No reported problems;plan to cont ambien 5 mg qHS prn

## 2015-09-17 NOTE — Assessment & Plan Note (Signed)
GFR 30-36; BUN/Cr  25/1.41, appears at baseline; will monitor at intervals

## 2015-09-17 NOTE — Assessment & Plan Note (Signed)
In 08/2015 LDL 37, HDL 55 , well controlled on pravachol 40 mg; plan to cont current med

## 2015-10-03 ENCOUNTER — Other Ambulatory Visit: Payer: Self-pay | Admitting: *Deleted

## 2015-10-03 MED ORDER — ZOLPIDEM TARTRATE 5 MG PO TABS: 5.0000 mg | ORAL_TABLET | Freq: Every day | ORAL | 3 refills | Status: DC

## 2015-10-03 NOTE — Telephone Encounter (Signed)
[facility] Facility #[phone] Fax: [phone]  

## 2015-10-05 DIAGNOSIS — N390 Urinary tract infection, site not specified: Secondary | ICD-10-CM | POA: Diagnosis not present

## 2015-10-09 DIAGNOSIS — E039 Hypothyroidism, unspecified: Secondary | ICD-10-CM | POA: Diagnosis not present

## 2015-10-10 LAB — TSH: TSH: 0.31 u[IU]/mL — AB (ref 0.41–5.90)

## 2015-10-11 DIAGNOSIS — I482 Chronic atrial fibrillation: Secondary | ICD-10-CM | POA: Diagnosis not present

## 2015-10-11 DIAGNOSIS — M6281 Muscle weakness (generalized): Secondary | ICD-10-CM | POA: Diagnosis not present

## 2015-10-11 DIAGNOSIS — R2689 Other abnormalities of gait and mobility: Secondary | ICD-10-CM | POA: Diagnosis not present

## 2015-10-11 DIAGNOSIS — Z9181 History of falling: Secondary | ICD-10-CM | POA: Diagnosis not present

## 2015-10-11 DIAGNOSIS — Z23 Encounter for immunization: Secondary | ICD-10-CM | POA: Diagnosis not present

## 2015-10-12 ENCOUNTER — Non-Acute Institutional Stay (SKILLED_NURSING_FACILITY): Payer: Medicare Other | Admitting: Internal Medicine

## 2015-10-12 ENCOUNTER — Encounter: Payer: Self-pay | Admitting: Internal Medicine

## 2015-10-12 DIAGNOSIS — R410 Disorientation, unspecified: Secondary | ICD-10-CM | POA: Diagnosis not present

## 2015-10-12 DIAGNOSIS — N3000 Acute cystitis without hematuria: Secondary | ICD-10-CM

## 2015-10-12 NOTE — Progress Notes (Signed)
Location:  [place] Room Number: [place] of Service:  SNF (31)  Noah Delaine. [name], MD  Patient Care Team: Provider Not In System as PCP - General  Extended Emergency Contact Information Primary Emergency Contact: [facility] Address: [address]          [place], [place] [address] Johnnette Litter of [place] Phone: [phone] Relation: Son Secondary Emergency Contact: [place], [place] [address] Johnnette Litter of [place] Phone: [phone] Mobile Phone: [phone] Relation: Son    Allergies: Review of patient's allergies indicates no known allergies.  Chief Complaint  Patient presents with  . Acute Visit    Acute    HPI: Patient is 82 y.o. female who became confused several days ago and a U/A was done on her. She had no fever  And has confusion so can not give other hx.She did not get sicker so we were able to wait for the MIC which came back today. Pt's urine returned with > 100,000 aerococcus.  Past Medical History:  Diagnosis Date  . Chronic atrial fibrillation ([place])   . Chronic renal insufficiency, stage III (moderate)   . Constipation   . CVA (cerebral infarction) approx 2012   residual defect: poor R sided peripheral vision per son  . Depression   . History of fracture of right hip 03/2015  . Hyperlipidemia   . Hypertension   . Hypothyroidism   . Insomnia   . Osteoarthritis    wrists, hands  . Osteoporosis     Past Surgical History:  Procedure Laterality Date  . APPENDECTOMY    . COLONOSCOPY  08/22/08  . INTRAMEDULLARY (IM) NAIL INTERTROCHANTERIC Right 03/22/2015   Procedure: RIGHT femoral HIP NAILING;  Surgeon: Gaynelle Arabian, MD;  Location: [facility];  Service: Orthopedics;  Laterality: Right;  . KNEE ARTHROSCOPY Right   . THYROID SURGERY        Medication List       Accurate as of 10/12/15  1:49 PM. Always use your most recent med list.          acetaminophen 500 MG tablet Commonly  known as:  TYLENOL Take 2 tablets (1,000 mg total) by mouth every 6 (six) hours as needed for mild pain, moderate pain or headache.   atenolol 50 MG tablet Commonly known as:  TENORMIN Take 50 mg by mouth daily.   ELIQUIS 2.5 MG Tabs tablet Generic drug:  apixaban Take 2.5 mg by mouth 2 (two) times daily.   feeding supplement Liqd Take 1 Container by mouth 2 (two) times daily between meals.   levothyroxine 50 MCG tablet Commonly known as:  SYNTHROID, LEVOTHROID Take 50 mcg by mouth daily before breakfast.   ondansetron 4 MG disintegrating tablet Commonly known as:  ZOFRAN-ODT Take 4 mg by mouth every 8 (eight) hours as needed for nausea or vomiting.   pravastatin 40 MG tablet Commonly known as:  PRAVACHOL Take 1 tablet (40 mg total) by mouth at bedtime.   senna 8.6 MG tablet Commonly known as:  SENOKOT Take 1 tablet by mouth at bedtime as needed.   traMADol 50 MG tablet Commonly known as:  ULTRAM 1 tab po q6h prn pain   Vitamin D-3 1000 units Caps Take 3,000 Units by mouth daily.   zolpidem 5 MG tablet Commonly known as:  AMBIEN Take 1 tablet (5 mg total) by mouth at bedtime.       No  orders of the defined types were placed in this encounter.   Immunization History  Administered Date(s) Administered  . Influenza Split 11/20/2012  . Influenza-Unspecified 10/07/2015  . PPD Test 08/14/2015  . Pneumococcal Conjugate-13 05/22/2015  . Pneumococcal Polysaccharide-23 08/22/2008  . Tdap 01/01/2015, 08/06/2015    Social History  Substance Use Topics  . Smoking status: Never Smoker  . Smokeless tobacco: Never Used  . Alcohol use No    Review of Systems   UTO 2/2 confusion ; nursing with concerns as per HPI    Vitals:   10/12/15 1039  BP: 130/80  Pulse: 72  Resp: 18  Temp: 97 F (36.1 C)   Body mass index is 19.24 kg/m. Physical Exam  GENERAL APPEARANCE: Alert, conversant, No acute distress  SKIN: No diaphoresis rash HEENT:  Unremarkable RESPIRATORY: Breathing is even, unlabored. Lung sounds are clear   CARDIOVASCULAR: Heart RRR no murmurs, rubs or gallops. No peripheral edema  GASTROINTESTINAL: Abdomen is soft, non-tender, not distended w/ normal bowel sounds.  GENITOURINARY: Bladder mild tender, not distended  MUSCULOSKELETAL: No abnormal joints or musculature NEUROLOGIC: Cranial nerves 2-12 grossly intact. Moves all extremities PSYCHIATRIC: moments of confusion, no behavioral issues  Patient Active Problem List   Diagnosis Date Noted  . CKD (chronic kidney disease) stage 3, GFR 30-59 ml/min 09/17/2015  . Insomnia 08/18/2015  . Confusion 06/20/2015  . Recent urinary tract infection 06/20/2015  . Dysuria 04/10/2015  . Edema 04/06/2015  . Acute encephalopathy 03/29/2015  . Vitamin D deficiency 03/29/2015  . UTI (lower urinary tract infection) 03/21/2015  . Hip fracture ([place]) 03/20/2015  . Chronic atrial fibrillation ([place]) 02/20/2015  . Hyperlipidemia 02/20/2015  . Essential hypertension, benign 12/13/2012  . Cardiac arrhythmia 12/13/2012  . Urinary incontinence 12/13/2012  . Hypothyroidism 12/13/2012  . History of depression 12/11/2012    CMP     Component Value Date/Time   NA 133 (L) 08/06/2015 0220   NA 135 (A) 04/04/2015   K 3.6 08/06/2015 0220   CL 97 (L) 08/06/2015 0220   CO2 26 08/06/2015 0220   GLUCOSE 125 (H) 08/06/2015 0220   BUN 25 (H) 08/06/2015 0220   BUN 28 (A) 04/04/2015   CREATININE 1.44 (H) 08/06/2015 0220   CALCIUM 8.7 (L) 08/06/2015 0220   PROT 6.4 (L) 03/20/2015 1700   ALBUMIN 3.5 03/20/2015 1700   AST 27 03/20/2015 1700   ALT 15 03/20/2015 1700   ALKPHOS 51 03/20/2015 1700   BILITOT 0.8 03/20/2015 1700   GFRNONAA 30 (L) 08/06/2015 0220   GFRAA 35 (L) 08/06/2015 0220    Recent Labs  05/22/15 1134 06/04/15 1952 08/06/15 0220  NA 139 138 133*  K 4.9 4.3 3.6  CL 101 104 97*  CO2 30 26 26   GLUCOSE 90 93 125*  BUN 27* 29* 25*  CREATININE 1.26* 1.25* 1.44*   CALCIUM 9.1 8.3* 8.7*    Recent Labs  02/20/15 1512 03/20/15 1700  AST 18 27  ALT 10 15  ALKPHOS 67 51  BILITOT 0.4 0.8  PROT 6.7 6.4*  ALBUMIN 3.8 3.5    Recent Labs  03/20/15 1700  05/22/15 1134 06/04/15 1952 08/06/15 0220  WBC 13.3*  < > 5.9 5.4 9.0  NEUTROABS 11.7*  --  3.6  --  6.7  HGB 8.6*  < > 11.9* 11.8* 10.7*  HCT 26.6*  < > 36.1 36.4 33.2*  MCV 88.4  < > 90.6 92.4 88.5  PLT 243  < > 291.0 266 282  < > =  values in this interval not displayed.  Recent Labs  08/24/15  CHOL 107  LDLCALC 37  TRIG 77   No results found for: [facility] Lab Results  Component Value Date   TSH 0.31 (A) 10/10/2015   Lab Results  Component Value Date   HGBA1C 5.3 08/24/2015   Lab Results  Component Value Date   CHOL 107 08/24/2015   HDL 55 08/24/2015   LDLCALC 37 08/24/2015   TRIG 77 08/24/2015    Significant Diagnostic Results in last 30 days:  No results found.  Assessment and Plan  AEROCOCCUS UTI - this org is not usually tested to antibiotics because it is routinely sensitive to a few antibiotics of which ampicillen is one. Have ordered amoxil 875mg  BID for 7 days with 10 days floraster; will minitor    Time spent > 25 min;> 50% of time with patient was spent reviewing records, labs, tests and studies, counseling and developing plan of care  Webb Silversmith D. [name], MD

## 2015-10-17 ENCOUNTER — Encounter: Payer: Self-pay | Admitting: Internal Medicine

## 2015-10-17 ENCOUNTER — Non-Acute Institutional Stay (SKILLED_NURSING_FACILITY): Payer: Medicare Other | Admitting: Internal Medicine

## 2015-10-17 DIAGNOSIS — E559 Vitamin D deficiency, unspecified: Secondary | ICD-10-CM

## 2015-10-17 DIAGNOSIS — I482 Chronic atrial fibrillation, unspecified: Secondary | ICD-10-CM

## 2015-10-17 DIAGNOSIS — I10 Essential (primary) hypertension: Secondary | ICD-10-CM | POA: Diagnosis not present

## 2015-10-17 NOTE — Progress Notes (Addendum)
Patient ID: Marie Robertson, female   DOB: 1920-07-27, 82 y.o.   MRN: [id number]  Location:  [place] Room Number: [place] of Service:  SNF [phone]  [name] MD  Patient Care Team: Provider Not In System as PCP - General  Extended Emergency Contact Information Primary Emergency Contact: [facility] Address: [address]          [place], [place] [address] Johnnette Litter of [place] Phone: [phone] Relation: Son Secondary Emergency Contact: [place], [place] [address] Johnnette Litter of [place] Phone: [phone] Mobile Phone: [phone] Relation: Son    Allergies: Review of patient's allergies indicates no known allergies.  Chief Complaint  Patient presents with  . Medical Management of Chronic Issues    routine visit    HPI: Patient is 82 y.o. female who is being seen for routine issues of HTN, Chronic AF, and Vit D def.  Past Medical History:  Diagnosis Date  . Chronic atrial fibrillation ([place])   . Chronic renal insufficiency, stage III (moderate)   . Constipation   . CVA (cerebral infarction) approx 2012   residual defect: poor R sided peripheral vision per son  . Depression   . History of fracture of right hip 03/2015  . Hyperlipidemia   . Hypertension   . Hypothyroidism   . Insomnia   . Osteoarthritis    wrists, hands  . Osteoporosis     Past Surgical History:  Procedure Laterality Date  . APPENDECTOMY    . COLONOSCOPY  08/22/08  . INTRAMEDULLARY (IM) NAIL INTERTROCHANTERIC Right 03/22/2015   Procedure: RIGHT femoral HIP NAILING;  Surgeon: Gaynelle Arabian, MD;  Location: [facility];  Service: Orthopedics;  Laterality: Right;  . KNEE ARTHROSCOPY Right   . THYROID SURGERY        Medication List       Accurate as of 10/17/15 11:59 PM. Always use your most recent med list.          acetaminophen 500 MG tablet Commonly known as:  TYLENOL Take 2 tablets (1,000 mg total) by mouth every 6  (six) hours as needed for mild pain, moderate pain or headache.   amoxicillin 875 MG tablet Commonly known as:  AMOXIL Take 875 mg by mouth 2 (two) times daily.   atenolol 50 MG tablet Commonly known as:  TENORMIN Take 50 mg by mouth daily.   ELIQUIS 2.5 MG Tabs tablet Generic drug:  apixaban Take 2.5 mg by mouth 2 (two) times daily.   feeding supplement Liqd Take 1 Container by mouth 2 (two) times daily between meals.   levothyroxine 50 MCG tablet Commonly known as:  SYNTHROID, LEVOTHROID Take 50 mcg by mouth daily before breakfast.   ondansetron 4 MG disintegrating tablet Commonly known as:  ZOFRAN-ODT Take 4 mg by mouth every 8 (eight) hours as needed for nausea or vomiting.   pravastatin 40 MG tablet Commonly known as:  PRAVACHOL Take 1 tablet (40 mg total) by mouth at bedtime.   saccharomyces boulardii 250 MG capsule Commonly known as:  FLORASTOR Take 250 mg by mouth 2 (two) times daily.   senna 8.6 MG tablet Commonly known as:  SENOKOT Take 1 tablet by mouth at bedtime as needed.   traMADol 50 MG tablet Commonly known as:  ULTRAM 1 tab po q6h prn pain   Vitamin D-3 1000 units Caps Take 3,000 Units by mouth daily.   zolpidem 5  MG tablet Commonly known as:  AMBIEN Take 1 tablet (5 mg total) by mouth at bedtime.       Meds ordered this encounter  Medications  . amoxicillin (AMOXIL) 875 MG tablet    Sig: Take 875 mg by mouth 2 (two) times daily.  Marland Kitchen saccharomyces boulardii (FLORASTOR) 250 MG capsule    Sig: Take 250 mg by mouth 2 (two) times daily.    Immunization History  Administered Date(s) Administered  . Influenza Split 11/20/2012  . Influenza-Unspecified 10/07/2015  . PPD Test 08/14/2015  . Pneumococcal Conjugate-13 05/22/2015  . Pneumococcal Polysaccharide-23 08/22/2008  . Tdap 01/01/2015, 08/06/2015    Social History  Substance Use Topics  . Smoking status: Never Smoker  . Smokeless tobacco: Never Used  . Alcohol use No    Review of  Systems  DATA OBTAINED: from patient, nurse GENERAL:  no fevers, fatigue, appetite changes SKIN: No itching, rash HEENT: No complaint RESPIRATORY: No cough, wheezing, SOB CARDIAC: No chest pain, palpitations, lower extremity edema  GI: No abdominal pain, No N/V/D or constipation, No heartburn or reflux  GU: No dysuria, frequency or urgency, or incontinence  MUSCULOSKELETAL: No unrelieved bone/joint pain NEUROLOGIC: No headache, dizziness  PSYCHIATRIC: No overt anxiety or sadness  Vitals:   10/17/15 1125  BP: (!) 141/84  Pulse: 68  Resp: 18  Temp: 98.4 F (36.9 C)   Body mass index is 19.14 kg/m. Physical Exam  GENERAL APPEARANCE: Alert, conversant, No acute distress  SKIN: contusion forehead is in final stages of resolving HEENT: Unremarkable RESPIRATORY: Breathing is even, unlabored. Lung sounds are clear   CARDIOVASCULAR: Heart irreg no murmurs, rubs or gallops. No peripheral edema  GASTROINTESTINAL: Abdomen is soft, non-tender, not distended w/ normal bowel sounds.  GENITOURINARY: Bladder non tender, not distended  MUSCULOSKELETAL: No abnormal joints or musculature NEUROLOGIC: Cranial nerves 2-12 grossly intact. Moves all extremities PSYCHIATRIC: Mood and affect appropriate to situation with some dementia, no behavioral issues  Patient Active Problem List   Diagnosis Date Noted  . CKD (chronic kidney disease) stage 3, GFR 30-59 ml/min 09/17/2015  . Insomnia 08/18/2015  . Confusion 06/20/2015  . Recent urinary tract infection 06/20/2015  . Dysuria 04/10/2015  . Edema 04/06/2015  . Acute encephalopathy 03/29/2015  . Vitamin D deficiency 03/29/2015  . UTI (lower urinary tract infection) 03/21/2015  . Hip fracture ([place]) 03/20/2015  . Chronic atrial fibrillation ([place]) 02/20/2015  . Hyperlipidemia 02/20/2015  . Essential hypertension, benign 12/13/2012  . Cardiac arrhythmia 12/13/2012  . Urinary incontinence 12/13/2012  . Hypothyroidism 12/13/2012  . History of  depression 12/11/2012    CMP     Component Value Date/Time   NA 133 (L) 08/06/2015 0220   NA 135 (A) 04/04/2015   K 3.6 08/06/2015 0220   CL 97 (L) 08/06/2015 0220   CO2 26 08/06/2015 0220   GLUCOSE 125 (H) 08/06/2015 0220   BUN 25 (H) 08/06/2015 0220   BUN 28 (A) 04/04/2015   CREATININE 1.44 (H) 08/06/2015 0220   CALCIUM 8.7 (L) 08/06/2015 0220   PROT 6.4 (L) 03/20/2015 1700   ALBUMIN 3.5 03/20/2015 1700   AST 27 03/20/2015 1700   ALT 15 03/20/2015 1700   ALKPHOS 51 03/20/2015 1700   BILITOT 0.8 03/20/2015 1700   GFRNONAA 30 (L) 08/06/2015 0220   GFRAA 35 (L) 08/06/2015 0220    Recent Labs  05/22/15 1134 06/04/15 1952 08/06/15 0220  NA 139 138 133*  K 4.9 4.3 3.6  CL 101 104 97*  CO2  30 26 26   GLUCOSE 90 93 125*  BUN 27* 29* 25*  CREATININE 1.26* 1.25* 1.44*  CALCIUM 9.1 8.3* 8.7*    Recent Labs  02/20/15 1512 03/20/15 1700  AST 18 27  ALT 10 15  ALKPHOS 67 51  BILITOT 0.4 0.8  PROT 6.7 6.4*  ALBUMIN 3.8 3.5    Recent Labs  03/20/15 1700  05/22/15 1134 06/04/15 1952 08/06/15 0220  WBC 13.3*  < > 5.9 5.4 9.0  NEUTROABS 11.7*  --  3.6  --  6.7  HGB 8.6*  < > 11.9* 11.8* 10.7*  HCT 26.6*  < > 36.1 36.4 33.2*  MCV 88.4  < > 90.6 92.4 88.5  PLT 243  < > 291.0 266 282  < > = values in this interval not displayed.  Recent Labs  08/24/15  CHOL 107  LDLCALC 37  TRIG 77   No results found for: [facility] Lab Results  Component Value Date   TSH 0.31 (A) 10/10/2015   Lab Results  Component Value Date   HGBA1C 5.3 08/24/2015   Lab Results  Component Value Date   CHOL 107 08/24/2015   HDL 55 08/24/2015   LDLCALC 37 08/24/2015   TRIG 77 08/24/2015    Significant Diagnostic Results in last 30 days:  No results found.  Assessment and Plan  Essential hypertension, benign Reasonably controlled; plan to cont tenormin 50 mg daily; will monitor and inc meds as needed  Chronic atrial fibrillation (HCC) Rate controlled with tenormin 50 mg  daily and prophylaxed with eliquis 2.5 mg BID  Vitamin D deficiency Cont replacemnent 1000 u daily; we have had difficulty with SNF lab - we are changing it- will re-order VitD level.    Inocencio Homes, MD

## 2015-10-20 ENCOUNTER — Encounter: Payer: Self-pay | Admitting: Internal Medicine

## 2015-10-20 ENCOUNTER — Non-Acute Institutional Stay (SKILLED_NURSING_FACILITY): Payer: Medicare Other | Admitting: Internal Medicine

## 2015-10-20 DIAGNOSIS — E034 Atrophy of thyroid (acquired): Secondary | ICD-10-CM

## 2015-10-20 DIAGNOSIS — F39 Unspecified mood [affective] disorder: Secondary | ICD-10-CM | POA: Diagnosis not present

## 2015-10-20 NOTE — Progress Notes (Signed)
Location:  [facility] Room Number: 208W Place of Service:  SNF [phone])  Noah Delaine. [name], MD  Patient Care Team: Provider Not In System as PCP - General  Extended Emergency Contact Information Primary Emergency Contact: [facility] Address: [address]          [place], [place] [address] Johnnette Litter of [place] Phone: [phone] Relation: Son Secondary Emergency Contact: [place], [place] [address] Johnnette Litter of [place] Phone: [phone] Mobile Phone: [phone] Relation: Son    Allergies: Review of patient's allergies indicates no known allergies.  Chief Complaint  Patient presents with  . Acute Visit    Acute     HPI: Patient is 82 y.o. female who is being seen today for 2 problems-one is a TSH off  0.31 and the other is nursing concern with pt with episodes of aggression and combativeness in the past week. Other times pt is fine, no c/o pain , urinary sx, cough, no fever, no change in appetite. Pt is wheeling herself about facility making friends.  Past Medical History:  Diagnosis Date  . Chronic atrial fibrillation ([place])   . Chronic renal insufficiency, stage III (moderate)   . Constipation   . CVA (cerebral infarction) approx 2012   residual defect: poor R sided peripheral vision per son  . Depression   . History of fracture of right hip 03/2015  . Hyperlipidemia   . Hypertension   . Hypothyroidism   . Insomnia   . Osteoarthritis    wrists, hands  . Osteoporosis     Past Surgical History:  Procedure Laterality Date  . APPENDECTOMY    . COLONOSCOPY  08/22/08  . INTRAMEDULLARY (IM) NAIL INTERTROCHANTERIC Right 03/22/2015   Procedure: RIGHT femoral HIP NAILING;  Surgeon: Gaynelle Arabian, MD;  Location: [facility];  Service: Orthopedics;  Laterality: Right;  . KNEE ARTHROSCOPY Right   . THYROID SURGERY        Medication List       Accurate as of 10/20/15  1:51 PM. Always use your most recent med  list.          acetaminophen 500 MG tablet Commonly known as:  TYLENOL Take 2 tablets (1,000 mg total) by mouth every 6 (six) hours as needed for mild pain, moderate pain or headache.   atenolol 50 MG tablet Commonly known as:  TENORMIN Take 50 mg by mouth daily.   ELIQUIS 2.5 MG Tabs tablet Generic drug:  apixaban Take 2.5 mg by mouth 2 (two) times daily.   feeding supplement Liqd Take 1 Container by mouth 2 (two) times daily between meals.   levothyroxine 50 MCG tablet Commonly known as:  SYNTHROID, LEVOTHROID Take 50 mcg by mouth daily before breakfast.   ondansetron 4 MG disintegrating tablet Commonly known as:  ZOFRAN-ODT Take 4 mg by mouth every 8 (eight) hours as needed for nausea or vomiting.   pravastatin 40 MG tablet Commonly known as:  PRAVACHOL Take 1 tablet (40 mg total) by mouth at bedtime.   saccharomyces boulardii 250 MG capsule Commonly known as:  FLORASTOR Take 250 mg by mouth 2 (two) times daily.   senna 8.6 MG tablet Commonly known as:  SENOKOT Take 1 tablet by mouth at bedtime as needed.   traMADol 50 MG tablet Commonly known as:  ULTRAM 1 tab po q6h prn pain   Vitamin D-3 1000 units Caps Take 3,000 Units by mouth daily.  zolpidem 5 MG tablet Commonly known as:  AMBIEN Take 1 tablet (5 mg total) by mouth at bedtime.       No orders of the defined types were placed in this encounter.   Immunization History  Administered Date(s) Administered  . Influenza Split 11/20/2012  . Influenza-Unspecified 10/07/2015  . PPD Test 08/14/2015  . Pneumococcal Conjugate-13 05/22/2015  . Pneumococcal Polysaccharide-23 08/22/2008  . Tdap 01/01/2015, 08/06/2015    Social History  Substance Use Topics  . Smoking status: Never Smoker  . Smokeless tobacco: Never Used  . Alcohol use No    Review of Systems  DATA OBTAINED: from patient, nurse GENERAL:  no fevers, fatigue, appetite changes SKIN: No itching, rash HEENT: No  complaint RESPIRATORY: No cough, wheezing, SOB CARDIAC: No chest pain, palpitations, lower extremity edema  GI: No abdominal pain, No N/V/D or constipation, No heartburn or reflux  GU: No dysuria, frequency or urgency, or incontinence  MUSCULOSKELETAL: No unrelieved bone/joint pain NEUROLOGIC: No headache, dizziness  PSYCHIATRIC: No overt anxiety or sadness  Vitals:   10/20/15 1348  BP: (!) 141/84  Pulse: 68  Resp: 16  Temp: 98.4 F (36.9 C)   Body mass index is 19.24 kg/m. Physical Exam  GENERAL APPEARANCE: Alert, conversant, No acute distress  SKIN: No diaphoresis rash; bruising r forehead is resolving HEENT: Unremarkable RESPIRATORY: Breathing is even, unlabored. Lung sounds are clear   CARDIOVASCULAR: Heart RRR no murmurs, rubs or gallops. No peripheral edema  GASTROINTESTINAL: Abdomen is soft, non-tender, not distended w/ normal bowel sounds.  GENITOURINARY: Bladder non tender, not distended  MUSCULOSKELETAL: No abnormal joints or musculature NEUROLOGIC: Cranial nerves 2-12 grossly intact. Moves all extremities PSYCHIATRIC: Mood and affect appropriate to situation, no behavioral issues at this moment  Patient Active Problem List   Diagnosis Date Noted  . CKD (chronic kidney disease) stage 3, GFR 30-59 ml/min 09/17/2015  . Insomnia 08/18/2015  . Confusion 06/20/2015  . Recent urinary tract infection 06/20/2015  . Dysuria 04/10/2015  . Edema 04/06/2015  . Acute encephalopathy 03/29/2015  . Vitamin D deficiency 03/29/2015  . UTI (lower urinary tract infection) 03/21/2015  . Hip fracture ([place]) 03/20/2015  . Chronic atrial fibrillation ([place]) 02/20/2015  . Hyperlipidemia 02/20/2015  . Essential hypertension, benign 12/13/2012  . Cardiac arrhythmia 12/13/2012  . Urinary incontinence 12/13/2012  . Hypothyroidism 12/13/2012  . History of depression 12/11/2012    CMP     Component Value Date/Time   NA 133 (L) 08/06/2015 0220   NA 135 (A) 04/04/2015   K 3.6  08/06/2015 0220   CL 97 (L) 08/06/2015 0220   CO2 26 08/06/2015 0220   GLUCOSE 125 (H) 08/06/2015 0220   BUN 25 (H) 08/06/2015 0220   BUN 28 (A) 04/04/2015   CREATININE 1.44 (H) 08/06/2015 0220   CALCIUM 8.7 (L) 08/06/2015 0220   PROT 6.4 (L) 03/20/2015 1700   ALBUMIN 3.5 03/20/2015 1700   AST 27 03/20/2015 1700   ALT 15 03/20/2015 1700   ALKPHOS 51 03/20/2015 1700   BILITOT 0.8 03/20/2015 1700   GFRNONAA 30 (L) 08/06/2015 0220   GFRAA 35 (L) 08/06/2015 0220    Recent Labs  05/22/15 1134 06/04/15 1952 08/06/15 0220  NA 139 138 133*  K 4.9 4.3 3.6  CL 101 104 97*  CO2 30 26 26   GLUCOSE 90 93 125*  BUN 27* 29* 25*  CREATININE 1.26* 1.25* 1.44*  CALCIUM 9.1 8.3* 8.7*    Recent Labs  02/20/15 1512 03/20/15 1700  AST  18 27  ALT 10 15  ALKPHOS 67 51  BILITOT 0.4 0.8  PROT 6.7 6.4*  ALBUMIN 3.8 3.5    Recent Labs  03/20/15 1700  05/22/15 1134 06/04/15 1952 08/06/15 0220  WBC 13.3*  < > 5.9 5.4 9.0  NEUTROABS 11.7*  --  3.6  --  6.7  HGB 8.6*  < > 11.9* 11.8* 10.7*  HCT 26.6*  < > 36.1 36.4 33.2*  MCV 88.4  < > 90.6 92.4 88.5  PLT 243  < > 291.0 266 282  < > = values in this interval not displayed.  Recent Labs  08/24/15  CHOL 107  LDLCALC 37  TRIG 77   No results found for: [facility] Lab Results  Component Value Date   TSH 0.31 (A) 10/10/2015   Lab Results  Component Value Date   HGBA1C 5.3 08/24/2015   Lab Results  Component Value Date   CHOL 107 08/24/2015   HDL 55 08/24/2015   LDLCALC 37 08/24/2015   TRIG 77 08/24/2015    Significant Diagnostic Results in last 30 days:  No results found.  Assessment and Plan  HYPOTHYROIDISM-  Pt is on 50 mcg, down from 75 mcg and 6 week TSH was 0.31. Have written to decrease synthroid to 25 mcg daily with repeat TSH 6 weeks.  MOOD PROBLEMS/ AGRESSION AND COMBATIVENESS- have started seroquel 25 mg qHS to go to 50 mg qHS tomorrow. Will monitor response    Time spent > 25 min;> 50% of time with  patient was spent reviewing records, labs, tests and studies, counseling and developing plan of care  Webb Silversmith D. [name], MD

## 2015-10-28 ENCOUNTER — Encounter: Payer: Self-pay | Admitting: Internal Medicine

## 2015-10-29 ENCOUNTER — Encounter: Payer: Self-pay | Admitting: Internal Medicine

## 2015-10-29 NOTE — Assessment & Plan Note (Signed)
Reasonably controlled; plan to cont tenormin 50 mg daily; will monitor and inc meds as needed

## 2015-10-29 NOTE — Assessment & Plan Note (Deleted)
Most recent TSH 0.31; synthroid was dec from 75 mcg to 50 mcg daily; repeat TSH in 6 weeks

## 2015-10-29 NOTE — Assessment & Plan Note (Signed)
Cont replacemnent 1000 u daily; we have had difficulty with SNF lab - we are changing it- will re-order VitD level.

## 2015-10-29 NOTE — Assessment & Plan Note (Signed)
Rate controlled with tenormin 50 mg daily and prophylaxed with eliquis 2.5 mg BID

## 2015-10-30 DIAGNOSIS — M6281 Muscle weakness (generalized): Secondary | ICD-10-CM | POA: Diagnosis not present

## 2015-10-30 DIAGNOSIS — I482 Chronic atrial fibrillation: Secondary | ICD-10-CM | POA: Diagnosis not present

## 2015-10-30 DIAGNOSIS — Z9181 History of falling: Secondary | ICD-10-CM | POA: Diagnosis not present

## 2015-10-30 DIAGNOSIS — R2689 Other abnormalities of gait and mobility: Secondary | ICD-10-CM | POA: Diagnosis not present

## 2015-10-31 DIAGNOSIS — E559 Vitamin D deficiency, unspecified: Secondary | ICD-10-CM | POA: Diagnosis not present

## 2015-11-02 DIAGNOSIS — F419 Anxiety disorder, unspecified: Secondary | ICD-10-CM | POA: Diagnosis not present

## 2015-11-02 DIAGNOSIS — F29 Unspecified psychosis not due to a substance or known physiological condition: Secondary | ICD-10-CM | POA: Diagnosis not present

## 2015-11-02 DIAGNOSIS — G4700 Insomnia, unspecified: Secondary | ICD-10-CM | POA: Diagnosis not present

## 2015-11-08 DIAGNOSIS — F0391 Unspecified dementia with behavioral disturbance: Secondary | ICD-10-CM | POA: Insufficient documentation

## 2015-11-08 DIAGNOSIS — F03918 Unspecified dementia, unspecified severity, with other behavioral disturbance: Secondary | ICD-10-CM | POA: Insufficient documentation

## 2015-11-08 HISTORY — DX: Unspecified dementia, unspecified severity, with other behavioral disturbance: F03.918

## 2015-11-08 HISTORY — DX: Unspecified dementia with behavioral disturbance: F03.91

## 2015-11-09 DIAGNOSIS — M6281 Muscle weakness (generalized): Secondary | ICD-10-CM | POA: Diagnosis not present

## 2015-11-09 DIAGNOSIS — R2681 Unsteadiness on feet: Secondary | ICD-10-CM | POA: Diagnosis not present

## 2015-11-10 ENCOUNTER — Encounter: Payer: Self-pay | Admitting: Internal Medicine

## 2015-11-10 ENCOUNTER — Non-Acute Institutional Stay (SKILLED_NURSING_FACILITY): Payer: Medicare Other | Admitting: Internal Medicine

## 2015-11-10 DIAGNOSIS — F29 Unspecified psychosis not due to a substance or known physiological condition: Secondary | ICD-10-CM | POA: Diagnosis not present

## 2015-11-10 DIAGNOSIS — R451 Restlessness and agitation: Secondary | ICD-10-CM

## 2015-11-10 NOTE — Progress Notes (Signed)
Location:  [facility] Room Number: [phone] Place of Service:  SNF [phone])  Marie Robertson. Marie Coil, MD  Patient Care Team: Provider Not In System as PCP - General  Extended Emergency Contact Information Primary Emergency Contact: [facility] Address: [address]          [place], [place] [address] Johnnette Litter of [place] Phone: [phone] Relation: Son Secondary Emergency Contact: [place], [place] [address] Johnnette Litter of [place] Phone: [phone] Mobile Phone: [phone] Relation: Son    Allergies: Review of patient's allergies indicates no known allergies.  Chief Complaint  Patient presents with  . Acute Visit    Acute    HPI: Patient is 82 y.o. female who asked me to put pt on ativan for agitation. I interviewed pt to find out what kind of agitation. She says that people are taking her things, she can't find all of her children, she can't find her keys. She is very agitated about it. She denies that she is afraid.  Past Medical History:  Diagnosis Date  . Chronic atrial fibrillation ([place])   . Chronic renal insufficiency, stage III (moderate)   . Constipation   . CVA (cerebral infarction) approx 2012   residual defect: poor R sided peripheral vision per son  . Depression   . History of fracture of right hip 03/2015  . Hyperlipidemia   . Hypertension   . Hypothyroidism   . Insomnia   . Osteoarthritis    wrists, hands  . Osteoporosis     Past Surgical History:  Procedure Laterality Date  . APPENDECTOMY    . COLONOSCOPY  08/22/08  . INTRAMEDULLARY (IM) NAIL INTERTROCHANTERIC Right 03/22/2015   Procedure: RIGHT femoral HIP NAILING;  Surgeon: Gaynelle Arabian, MD;  Location: [facility];  Service: Orthopedics;  Laterality: Right;  . KNEE ARTHROSCOPY Right   . THYROID SURGERY        Medication List       Accurate as of 11/10/15  3:34 PM. Always use your most recent med list.          acetaminophen 500 MG  tablet Commonly known as:  TYLENOL Take 2 tablets (1,000 mg total) by mouth every 6 (six) hours as needed for mild pain, moderate pain or headache.   atenolol 50 MG tablet Commonly known as:  TENORMIN Take 50 mg by mouth daily.   ELIQUIS 2.5 MG Tabs tablet Generic drug:  apixaban Take 2.5 mg by mouth 2 (two) times daily.   feeding supplement Liqd Take 1 Container by mouth 2 (two) times daily between meals.   levothyroxine 50 MCG tablet Commonly known as:  SYNTHROID, LEVOTHROID Take 50 mcg by mouth daily before breakfast.   ondansetron 4 MG disintegrating tablet Commonly known as:  ZOFRAN-ODT Take 4 mg by mouth every 8 (eight) hours as needed for nausea or vomiting.   pravastatin 40 MG tablet Commonly known as:  PRAVACHOL Take 1 tablet (40 mg total) by mouth at bedtime.   senna 8.6 MG tablet Commonly known as:  SENOKOT Take 1 tablet by mouth at bedtime as needed.   traMADol 50 MG tablet Commonly known as:  ULTRAM 1 tab po q6h prn pain   Vitamin D-3 1000 units Caps Take 3,000 Units by mouth daily.   zolpidem 5 MG tablet Commonly known as:  AMBIEN Take 1 tablet (5 mg total) by mouth at bedtime.       No  orders of the defined types were placed in this encounter.   Immunization History  Administered Date(s) Administered  . Influenza Split 11/20/2012  . Influenza-Unspecified 10/07/2015  . PPD Test 08/14/2015  . Pneumococcal Conjugate-13 05/22/2015  . Pneumococcal Polysaccharide-23 08/22/2008  . Tdap 01/01/2015, 08/06/2015    Social History  Substance Use Topics  . Smoking status: Never Smoker  . Smokeless tobacco: Never Used  . Alcohol use No    Review of Systems- pt can not fully participate; nursing - no concerns other than the one in HPI    Vitals:   11/10/15 1531  BP: (!) 141/84  Pulse: 67  Resp: 18  Temp: 97.6 F (36.4 C)   Body mass index is 21.03 kg/m. Physical Exam  GENERAL APPEARANCE: Alert, conversant, agitated SKIN: No diaphoresis  rash HEENT: Unremarkable RESPIRATORY: Breathing is even, unlabored. Lung sounds are clear   CARDIOVASCULAR: Heart RRR no murmurs, rubs or gallops. No peripheral edema  GASTROINTESTINAL: Abdomen is soft, non-tender, not distended w/ normal bowel sounds.  GENITOURINARY: Bladder non tender, not distended  MUSCULOSKELETAL: No abnormal joints or musculature NEUROLOGIC: Cranial nerves 2-12 grossly intact. Moves all extremities PSYCHIATRIC: pressured speech, agitated mildly paranoid  Patient Active Problem List   Diagnosis Date Noted  . Dementia with behavioral disturbance 11/08/2015  . CKD (chronic kidney disease) stage 3, GFR 30-59 ml/min 09/17/2015  . Insomnia 08/18/2015  . Confusion 06/20/2015  . Recent urinary tract infection 06/20/2015  . Dysuria 04/10/2015  . Edema 04/06/2015  . Acute encephalopathy 03/29/2015  . Vitamin D deficiency 03/29/2015  . UTI (lower urinary tract infection) 03/21/2015  . Hip fracture ([place]) 03/20/2015  . Chronic atrial fibrillation ([place]) 02/20/2015  . Hyperlipidemia 02/20/2015  . Essential hypertension, benign 12/13/2012  . Cardiac arrhythmia 12/13/2012  . Urinary incontinence 12/13/2012  . Hypothyroidism 12/13/2012  . History of depression 12/11/2012    CMP     Component Value Date/Time   NA 133 (L) 08/06/2015 0220   NA 135 (A) 04/04/2015   K 3.6 08/06/2015 0220   CL 97 (L) 08/06/2015 0220   CO2 26 08/06/2015 0220   GLUCOSE 125 (H) 08/06/2015 0220   BUN 25 (H) 08/06/2015 0220   BUN 28 (A) 04/04/2015   CREATININE 1.44 (H) 08/06/2015 0220   CALCIUM 8.7 (L) 08/06/2015 0220   PROT 6.4 (L) 03/20/2015 1700   ALBUMIN 3.5 03/20/2015 1700   AST 27 03/20/2015 1700   ALT 15 03/20/2015 1700   ALKPHOS 51 03/20/2015 1700   BILITOT 0.8 03/20/2015 1700   GFRNONAA 30 (L) 08/06/2015 0220   GFRAA 35 (L) 08/06/2015 0220    Recent Labs  05/22/15 1134 06/04/15 1952 08/06/15 0220  NA 139 138 133*  K 4.9 4.3 3.6  CL 101 104 97*  CO2 30 26 26   GLUCOSE  90 93 125*  BUN 27* 29* 25*  CREATININE 1.26* 1.25* 1.44*  CALCIUM 9.1 8.3* 8.7*    Recent Labs  02/20/15 1512 03/20/15 1700  AST 18 27  ALT 10 15  ALKPHOS 67 51  BILITOT 0.4 0.8  PROT 6.7 6.4*  ALBUMIN 3.8 3.5    Recent Labs  03/20/15 1700  05/22/15 1134 06/04/15 1952 08/06/15 0220  WBC 13.3*  < > 5.9 5.4 9.0  NEUTROABS 11.7*  --  3.6  --  6.7  HGB 8.6*  < > 11.9* 11.8* 10.7*  HCT 26.6*  < > 36.1 36.4 33.2*  MCV 88.4  < > 90.6 92.4 88.5  PLT 243  < >  291.0 266 282  < > = values in this interval not displayed.  Recent Labs  08/24/15  CHOL 107  LDLCALC 37  TRIG 77   No results found for: [facility] Lab Results  Component Value Date   TSH 0.31 (A) 10/10/2015   Lab Results  Component Value Date   HGBA1C 5.3 08/24/2015   Lab Results  Component Value Date   CHOL 107 08/24/2015   HDL 55 08/24/2015   LDLCALC 37 08/24/2015   TRIG 77 08/24/2015    Significant Diagnostic Results in last 30 days:  No results found.  Assessment and Plan  PSYCHOSIS/AGITATION - I HAD STARTED PT ON SEROQUEL SEVERAL WEEKS AGO, 25 MG , THEN UP TO 50 MG Qhs. pSYH SAW HER , APPARENTLY DIDN'T SEE MY ORDER AND WROTE FOR 25 MG OR SEROQUEL ALL OVER AGAIN. I increased pt up to seroquel 50 mg qHS again, wrote for a one time order of ativan 1 mg po now and wrote for ativan 0.5 mg TID prn. Will inc seroquel if needed;woll have psych see next visit.     Time spent . 25 min Marie Robertson. Marie Coil, MD

## 2015-11-13 DIAGNOSIS — Z79899 Other long term (current) drug therapy: Secondary | ICD-10-CM | POA: Diagnosis not present

## 2015-11-13 DIAGNOSIS — N390 Urinary tract infection, site not specified: Secondary | ICD-10-CM | POA: Diagnosis not present

## 2015-11-13 DIAGNOSIS — R319 Hematuria, unspecified: Secondary | ICD-10-CM | POA: Diagnosis not present

## 2015-11-14 DIAGNOSIS — H04123 Dry eye syndrome of bilateral lacrimal glands: Secondary | ICD-10-CM | POA: Diagnosis not present

## 2015-11-14 DIAGNOSIS — Z961 Presence of intraocular lens: Secondary | ICD-10-CM | POA: Diagnosis not present

## 2015-11-15 ENCOUNTER — Encounter: Payer: Self-pay | Admitting: Internal Medicine

## 2015-11-15 ENCOUNTER — Non-Acute Institutional Stay (SKILLED_NURSING_FACILITY): Payer: Medicare Other | Admitting: Internal Medicine

## 2015-11-15 DIAGNOSIS — N3090 Cystitis, unspecified without hematuria: Secondary | ICD-10-CM | POA: Diagnosis not present

## 2015-11-15 DIAGNOSIS — B961 Klebsiella pneumoniae [K. pneumoniae] as the cause of diseases classified elsewhere: Secondary | ICD-10-CM

## 2015-11-15 DIAGNOSIS — B9689 Other specified bacterial agents as the cause of diseases classified elsewhere: Secondary | ICD-10-CM | POA: Diagnosis not present

## 2015-11-15 NOTE — Progress Notes (Signed)
Location:  [facility] Room Number: [phone] Place of Service:  SNF [phone])  Marie Robertson. [name], MD  Patient Care Team: Provider Not In System as PCP - General  Extended Emergency Contact Information Primary Emergency Contact: [facility] Address: [place], [place] [address] Johnnette Litter of [place] Phone: [phone] Relation: Son Secondary Emergency Contact: [place], [place] [address] Johnnette Litter of [place] Phone: [phone] Mobile Phone: [phone] Relation: Son    Allergies: Patient has no known allergies.  Chief Complaint  Patient presents with  . Acute Visit    Acute    HPI: Patient is 82 y.o. female who had a mental status  with mild paranoia, requiring new orders for ativan and a urine was ordered. The pt is being seen again today because the urine results returned with >100,000 Klebsiella Pneumoniae sensitive. Pt with dementia, denies sx.  . Past Medical History:  Diagnosis Date  . Chronic atrial fibrillation ([place])   . Chronic renal insufficiency, stage III (moderate)   . Constipation   . CVA (cerebral infarction) approx 2012   residual defect: poor R sided peripheral vision per son  . Depression   . History of fracture of right hip 03/2015  . Hyperlipidemia   . Hypertension   . Hypothyroidism   . Insomnia   . Osteoarthritis    wrists, hands  . Osteoporosis     Past Surgical History:  Procedure Laterality Date  . APPENDECTOMY    . COLONOSCOPY  08/22/08  . INTRAMEDULLARY (IM) NAIL INTERTROCHANTERIC Right 03/22/2015   Procedure: RIGHT femoral HIP NAILING;  Surgeon: Gaynelle Arabian, MD;  [facility]: [facility];  Service: Orthopedics;  Laterality: Right;  . KNEE ARTHROSCOPY Right   . THYROID SURGERY        Medication List       Accurate as of 11/15/15 11:59 PM. Always use your most recent med list.          acetaminophen 500 MG tablet Commonly known as:  TYLENOL Take 2 tablets  (1,000 mg total) by mouth every 6 (six) hours as needed for mild pain, moderate pain or headache.   atenolol 50 MG tablet Commonly known as:  TENORMIN Take 50 mg by mouth daily.   ELIQUIS 2.5 MG Tabs tablet Generic drug:  apixaban Take 2.5 mg by mouth 2 (two) times daily.   feeding supplement Liqd Take 1 Container by mouth 2 (two) times daily between meals.   levothyroxine 50 MCG tablet Commonly known as:  SYNTHROID, LEVOTHROID Take 50 mcg by mouth daily before breakfast.   LORazepam 0.5 MG tablet Commonly known as:  ATIVAN Take 0.5 mg by mouth 3 (three) times daily as needed for anxiety.   ondansetron 4 MG disintegrating tablet Commonly known as:  ZOFRAN-ODT Take 4 mg by mouth every 8 (eight) hours as needed for nausea or vomiting.   pravastatin 40 MG tablet Commonly known as:  PRAVACHOL Take 1 tablet (40 mg total) by mouth at bedtime.   QUEtiapine 50 MG tablet Commonly known as:  SEROQUEL Take 50 mg by mouth at bedtime.   senna 8.6 MG tablet Commonly known as:  SENOKOT Take 1 tablet by mouth at bedtime as needed.   traMADol 50 MG tablet Commonly known as:  ULTRAM 1 tab po q6h prn pain   Vitamin D-3 1000 units Caps Take 3,000 Units by mouth daily.  zolpidem 5 MG tablet Commonly known as:  AMBIEN Take 1 tablet (5 mg total) by mouth at bedtime.       Meds ordered this encounter  Medications  . LORazepam (ATIVAN) 0.5 MG tablet    Sig: Take 0.5 mg by mouth 3 (three) times daily as needed for anxiety.  Marland Kitchen QUEtiapine (SEROQUEL) 50 MG tablet    Sig: Take 50 mg by mouth at bedtime.    Immunization History  Administered Date(s) Administered  . Influenza Split 11/20/2012  . Influenza-Unspecified 10/07/2015  . PPD Test 08/14/2015  . Pneumococcal Conjugate-13 05/22/2015  . Pneumococcal Polysaccharide-23 08/22/2008  . Tdap 01/01/2015, 08/06/2015    Social History  Substance Use Topics  . Smoking status: Never Smoker  . Smokeless tobacco: Never Used  .  Alcohol use No    Review of Systems  UTO 2/2 dementia    Vitals:   11/15/15 1259  BP: (!) 153/83  Pulse: 73  Resp: 18  Temp: 98.8 F (37.1 C)   Body mass index is 20.9 kg/m. Physical Exam  GENERAL APPEARANCE: Alert, conversant, No acute distress  SKIN: No diaphoresis rash HEENT: Unremarkable RESPIRATORY: Breathing is even, unlabored. Lung sounds are clear   CARDIOVASCULAR: Heart RRR no murmurs, rubs or gallops. No peripheral edema  GASTROINTESTINAL: Abdomen is soft, non-tender, not distended w/ normal bowel sounds.  GENITOURINARY: Bladder non tender, not distended  MUSCULOSKELETAL: No abnormal joints or musculature NEUROLOGIC: Cranial nerves 2-12 grossly intact. Moves all extremities PSYCHIATRIC: Mood and affect appropriate to situation with dementia, no behavioral issues  Patient Active Problem List   Diagnosis Date Noted  . Dementia with behavioral disturbance 11/08/2015  . CKD (chronic kidney disease) stage 3, GFR 30-59 ml/min 09/17/2015  . Insomnia 08/18/2015  . Confusion 06/20/2015  . Recent urinary tract infection 06/20/2015  . Dysuria 04/10/2015  . Edema 04/06/2015  . Acute encephalopathy 03/29/2015  . Vitamin D deficiency 03/29/2015  . UTI (lower urinary tract infection) 03/21/2015  . Hip fracture ([place]) 03/20/2015  . Chronic atrial fibrillation ([place]) 02/20/2015  . Hyperlipidemia 02/20/2015  . Essential hypertension, benign 12/13/2012  . Cardiac arrhythmia 12/13/2012  . Urinary incontinence 12/13/2012  . Hypothyroidism 12/13/2012  . History of depression 12/11/2012    CMP     Component Value Date/Time   NA 133 (L) 08/06/2015 0220   NA 135 (A) 04/04/2015   K 3.6 08/06/2015 0220   CL 97 (L) 08/06/2015 0220   CO2 26 08/06/2015 0220   GLUCOSE 125 (H) 08/06/2015 0220   BUN 25 (H) 08/06/2015 0220   BUN 28 (A) 04/04/2015   CREATININE 1.44 (H) 08/06/2015 0220   CALCIUM 8.7 (L) 08/06/2015 0220   PROT 6.4 (L) 03/20/2015 1700   ALBUMIN 3.5 03/20/2015 1700    AST 27 03/20/2015 1700   ALT 15 03/20/2015 1700   ALKPHOS 51 03/20/2015 1700   BILITOT 0.8 03/20/2015 1700   GFRNONAA 30 (L) 08/06/2015 0220   GFRAA 35 (L) 08/06/2015 0220   11/01/15 Vitamin D 31  Recent Labs  05/22/15 1134 06/04/15 1952 08/06/15 0220  NA 139 138 133*  K 4.9 4.3 3.6  CL 101 104 97*  CO2 30 26 26   GLUCOSE 90 93 125*  BUN 27* 29* 25*  CREATININE 1.26* 1.25* 1.44*  CALCIUM 9.1 8.3* 8.7*    Recent Labs  02/20/15 1512 03/20/15 1700  AST 18 27  ALT 10 15  ALKPHOS 67 51  BILITOT 0.4 0.8  PROT 6.7 6.4*  ALBUMIN 3.8 3.5  Recent Labs  03/20/15 1700  05/22/15 1134 06/04/15 1952 08/06/15 0220  WBC 13.3*  < > 5.9 5.4 9.0  NEUTROABS 11.7*  --  3.6  --  6.7  HGB 8.6*  < > 11.9* 11.8* 10.7*  HCT 26.6*  < > 36.1 36.4 33.2*  MCV 88.4  < > 90.6 92.4 88.5  PLT 243  < > 291.0 266 282  < > = values in this interval not displayed.  Recent Labs  08/24/15  CHOL 107  LDLCALC 37  TRIG 77   No results found for: [facility] Lab Results  Component Value Date   TSH 0.31 (A) 10/10/2015   Lab Results  Component Value Date   HGBA1C 5.3 08/24/2015   Lab Results  Component Value Date   CHOL 107 08/24/2015   HDL 55 08/24/2015   LDLCALC 37 08/24/2015   TRIG 77 08/24/2015    Significant Diagnostic Results in last 30 days:  No results found.  Assessment and Plan  KLEBSIELLA UTI - pt's CrCl is 19.3 , have written for Omnicef 300 mg q 24 hours for 7 days. Pt is sdrinking fine so she she doesn't need IVF    Time spent . 25 min;> 50% of time with patient was spent reviewing records, labs, tests and studies, counseling and developing plan of care  Marie Robertson. [name], MD

## 2015-11-17 ENCOUNTER — Non-Acute Institutional Stay (SKILLED_NURSING_FACILITY): Payer: Medicare Other | Admitting: Internal Medicine

## 2015-11-17 ENCOUNTER — Encounter: Payer: Self-pay | Admitting: Internal Medicine

## 2015-11-17 DIAGNOSIS — E034 Atrophy of thyroid (acquired): Secondary | ICD-10-CM | POA: Diagnosis not present

## 2015-11-17 DIAGNOSIS — F0281 Dementia in other diseases classified elsewhere with behavioral disturbance: Secondary | ICD-10-CM | POA: Diagnosis not present

## 2015-11-17 DIAGNOSIS — F39 Unspecified mood [affective] disorder: Secondary | ICD-10-CM

## 2015-11-17 DIAGNOSIS — G301 Alzheimer's disease with late onset: Secondary | ICD-10-CM

## 2015-11-17 DIAGNOSIS — F02818 Alzheimer's disease with late onset: Secondary | ICD-10-CM

## 2015-11-17 NOTE — Progress Notes (Signed)
Location:  [facility] Room Number: [id number] Place of Service:  SNF [phone])  Noah Delaine. [name], MD  Patient Care Team: Provider Not In System as PCP - General  Extended Emergency Contact Information Primary Emergency Contact: [facility] Address: [place], [place] [address] Johnnette Litter of [place] Phone: [phone] Relation: Son Secondary Emergency Contact: [place],  [address] Johnnette Litter of [place] Phone: [phone] Mobile Phone: [phone] Relation: Son    Allergies: Patient has no known allergies.  Chief Complaint  Patient presents with  . Medical Management of Chronic Issues    Routine Visit    HPI: Patient is 82 y.o. female who is being seen for routine issues of dementia, mood disorder and hypothyroidism.  Past Medical History:  Diagnosis Date  . Chronic atrial fibrillation ([place])   . Chronic renal insufficiency, stage III (moderate)   . Constipation   . CVA (cerebral infarction) approx 2012   residual defect: poor R sided peripheral vision per son  . Depression   . History of fracture of right hip 03/2015  . Hyperlipidemia   . Hypertension   . Hypothyroidism   . Insomnia   . Osteoarthritis    wrists, hands  . Osteoporosis     Past Surgical History:  Procedure Laterality Date  . APPENDECTOMY    . COLONOSCOPY  08/22/08  . INTRAMEDULLARY (IM) NAIL INTERTROCHANTERIC Right 03/22/2015   Procedure: RIGHT femoral HIP NAILING;  Surgeon: Gaynelle Arabian, MD;  Location: [facility];  Service: Orthopedics;  Laterality: Right;  . KNEE ARTHROSCOPY Right   . THYROID SURGERY        Medication List       Accurate as of 11/17/15 11:59 PM. Always use your most recent med list.          acetaminophen 500 MG tablet Commonly known as:  TYLENOL Take 2 tablets (1,000 mg total) by mouth every 6 (six) hours as needed for mild pain, moderate pain or headache.   atenolol 50 MG  tablet Commonly known as:  TENORMIN Take 50 mg by mouth daily.   cefdinir 300 MG capsule Commonly known as:  OMNICEF Take 300 mg by mouth daily.   ELIQUIS 2.5 MG Tabs tablet Generic drug:  apixaban Take 2.5 mg by mouth 2 (two) times daily.   feeding supplement Liqd Take 1 Container by mouth 2 (two) times daily between meals.   levothyroxine 50 MCG tablet Commonly known as:  SYNTHROID, LEVOTHROID Take 50 mcg by mouth daily before breakfast.   LORazepam 0.5 MG tablet Commonly known as:  ATIVAN Take 0.5 mg by mouth 3 (three) times daily as needed for anxiety.   ondansetron 4 MG disintegrating tablet Commonly known as:  ZOFRAN-ODT Take 4 mg by mouth every 8 (eight) hours as needed for nausea or vomiting.   pravastatin 40 MG tablet Commonly known as:  PRAVACHOL Take 1 tablet (40 mg total) by mouth at bedtime.   QUEtiapine 50 MG tablet Commonly known as:  SEROQUEL Take 50 mg by mouth at bedtime.   senna 8.6 MG tablet Commonly known as:  SENOKOT Take 1 tablet by mouth at bedtime as needed.   traMADol 50 MG tablet Commonly known as:  ULTRAM 1 tab po q6h prn pain   Vitamin D-3 1000 units Caps Take 3,000 Units by mouth daily.   zolpidem 5 MG tablet Commonly known  as:  AMBIEN Take 1 tablet (5 mg total) by mouth at bedtime.       Meds ordered this encounter  Medications  . cefdinir (OMNICEF) 300 MG capsule    Sig: Take 300 mg by mouth daily.     Immunization History  Administered Date(s) Administered  . Influenza Split 11/20/2012  . Influenza-Unspecified 10/07/2015  . PPD Test 08/14/2015  . Pneumococcal Conjugate-13 05/22/2015  . Pneumococcal Polysaccharide-23 08/22/2008  . Tdap 01/01/2015, 08/06/2015    Social History  Substance Use Topics  . Smoking status: Never Smoker  . Smokeless tobacco: Never Used  . Alcohol use No    Review of Systems  UTO 2/2 dementia; nursing without concerns    Vitals:   11/17/15 0930  BP: 122/81  Pulse: 82  Resp: 18   Temp: 98.1 F (36.7 C)   Body mass index is 19.07 kg/m. Physical Exam  GENERAL APPEARANCE: Alert, conversant, No acute distress  SKIN: No diaphoresis rash HEENT: Unremarkable RESPIRATORY: Breathing is even, unlabored. Lung sounds are clear   CARDIOVASCULAR: Heart RRR no murmurs, rubs or gallops. No peripheral edema  GASTROINTESTINAL: Abdomen is soft, non-tender, not distended w/ normal bowel sounds.  GENITOURINARY: Bladder non tender, not distended  MUSCULOSKELETAL: No abnormal joints or musculature NEUROLOGIC: Cranial nerves 2-12 grossly intact. Moves all extremities PSYCHIATRIC: Mood and affect appropriate to situation with dementia, no behavioral issues  Patient Active Problem List   Diagnosis Date Noted  . Mood disorder ([place]) 11/19/2015  . Dementia with behavioral disturbance 11/08/2015  . CKD (chronic kidney disease) stage 3, GFR 30-59 ml/min 09/17/2015  . Insomnia 08/18/2015  . Confusion 06/20/2015  . Recent urinary tract infection 06/20/2015  . Dysuria 04/10/2015  . Edema 04/06/2015  . Acute encephalopathy 03/29/2015  . Vitamin D deficiency 03/29/2015  . UTI (lower urinary tract infection) 03/21/2015  . Hip fracture ([place]) 03/20/2015  . Chronic atrial fibrillation ([place]) 02/20/2015  . Hyperlipidemia 02/20/2015  . Essential hypertension, benign 12/13/2012  . Cardiac arrhythmia 12/13/2012  . Urinary incontinence 12/13/2012  . Hypothyroidism 12/13/2012  . History of depression 12/11/2012    CMP     Component Value Date/Time   NA 133 (L) 08/06/2015 0220   NA 135 (A) 04/04/2015   K 3.6 08/06/2015 0220   CL 97 (L) 08/06/2015 0220   CO2 26 08/06/2015 0220   GLUCOSE 125 (H) 08/06/2015 0220   BUN 25 (H) 08/06/2015 0220   BUN 28 (A) 04/04/2015   CREATININE 1.44 (H) 08/06/2015 0220   CALCIUM 8.7 (L) 08/06/2015 0220   PROT 6.4 (L) 03/20/2015 1700   ALBUMIN 3.5 03/20/2015 1700   AST 27 03/20/2015 1700   ALT 15 03/20/2015 1700   ALKPHOS 51 03/20/2015 1700   BILITOT  0.8 03/20/2015 1700   GFRNONAA 30 (L) 08/06/2015 0220   GFRAA 35 (L) 08/06/2015 0220    Recent Labs  05/22/15 1134 06/04/15 1952 08/06/15 0220  NA 139 138 133*  K 4.9 4.3 3.6  CL 101 104 97*  CO2 30 26 26   GLUCOSE 90 93 125*  BUN 27* 29* 25*  CREATININE 1.26* 1.25* 1.44*  CALCIUM 9.1 8.3* 8.7*    Recent Labs  02/20/15 1512 03/20/15 1700  AST 18 27  ALT 10 15  ALKPHOS 67 51  BILITOT 0.4 0.8  PROT 6.7 6.4*  ALBUMIN 3.8 3.5    Recent Labs  03/20/15 1700  05/22/15 1134 06/04/15 1952 08/06/15 0220  WBC 13.3*  < > 5.9 5.4 9.0  NEUTROABS 11.7*  --  3.6  --  6.7  HGB 8.6*  < > 11.9* 11.8* 10.7*  HCT 26.6*  < > 36.1 36.4 33.2*  MCV 88.4  < > 90.6 92.4 88.5  PLT 243  < > 291.0 266 282  < > = values in this interval not displayed.  Recent Labs  08/24/15  CHOL 107  LDLCALC 37  TRIG 77   No results found for: [facility] Lab Results  Component Value Date   TSH 0.31 (A) 10/10/2015   Lab Results  Component Value Date   HGBA1C 5.3 08/24/2015   Lab Results  Component Value Date   CHOL 107 08/24/2015   HDL 55 08/24/2015   LDLCALC 37 08/24/2015   TRIG 77 08/24/2015    Significant Diagnostic Results in last 30 days:  No results found.  Assessment and Plan  Dementia with behavioral disturbance At 16 it is too late to start dementia specific meds; with mood disorder meds pt is doing better;plan to cont supportive care  Mood disorder ([place]) Pt is now on seroquel 50 mg q HS; appears much more calm and content;still in hall daily in [place] to cont current med  Hypothyroidism In 10/2015 TSH 0.31, much improved over prior but still too low will dec synthroid to 50 mcg daily with TSH in 6 weeks    Webb Silversmith D. [name], MD

## 2015-11-19 ENCOUNTER — Encounter: Payer: Self-pay | Admitting: Internal Medicine

## 2015-11-19 DIAGNOSIS — F39 Unspecified mood [affective] disorder: Secondary | ICD-10-CM | POA: Insufficient documentation

## 2015-11-19 NOTE — Assessment & Plan Note (Signed)
Pt is now on seroquel 50 mg q HS; appears much more calm and content;still in hall daily in [place] to cont current med

## 2015-11-19 NOTE — Assessment & Plan Note (Signed)
At 64 it is too late to start dementia specific meds; with mood disorder meds pt is doing better;plan to cont supportive care

## 2015-11-19 NOTE — Assessment & Plan Note (Signed)
In 10/2015 TSH 0.31, much improved over prior but still too low will dec synthroid to 50 mcg daily with TSH in 6 weeks

## 2015-11-23 DIAGNOSIS — I10 Essential (primary) hypertension: Secondary | ICD-10-CM | POA: Diagnosis not present

## 2015-11-23 LAB — BASIC METABOLIC PANEL
BUN: 41 mg/dL — AB (ref 4–21)
Creatinine: 1.6 mg/dL — AB (ref 0.5–1.1)
Glucose: 98 mg/dL
Potassium: 4.4 mmol/L (ref 3.4–5.3)
SODIUM: 142 mmol/L (ref 137–147)

## 2015-11-30 ENCOUNTER — Emergency Department (HOSPITAL_COMMUNITY): Payer: Medicare Other

## 2015-11-30 ENCOUNTER — Inpatient Hospital Stay (HOSPITAL_COMMUNITY)
Admission: EM | Admit: 2015-11-30 | Discharge: 2015-12-02 | DRG: 536 | Disposition: A | Discharge status: Skilled Nursing Facility | Payer: Medicare Other | Attending: Family Medicine | Admitting: Family Medicine

## 2015-11-30 ENCOUNTER — Encounter (HOSPITAL_COMMUNITY): Payer: Self-pay

## 2015-11-30 DIAGNOSIS — J9811 Atelectasis: Secondary | ICD-10-CM | POA: Diagnosis not present

## 2015-11-30 DIAGNOSIS — N183 Chronic kidney disease, stage 3 (moderate): Secondary | ICD-10-CM | POA: Diagnosis present

## 2015-11-30 DIAGNOSIS — M25559 Pain in unspecified hip: Secondary | ICD-10-CM | POA: Diagnosis not present

## 2015-11-30 DIAGNOSIS — Z8673 Personal history of transient ischemic attack (TIA), and cerebral infarction without residual deficits: Secondary | ICD-10-CM

## 2015-11-30 DIAGNOSIS — E039 Hypothyroidism, unspecified: Secondary | ICD-10-CM | POA: Diagnosis present

## 2015-11-30 DIAGNOSIS — F0280 Dementia in other diseases classified elsewhere without behavioral disturbance: Secondary | ICD-10-CM | POA: Diagnosis not present

## 2015-11-30 DIAGNOSIS — I129 Hypertensive chronic kidney disease with stage 1 through stage 4 chronic kidney disease, or unspecified chronic kidney disease: Secondary | ICD-10-CM | POA: Diagnosis present

## 2015-11-30 DIAGNOSIS — S72012A Unspecified intracapsular fracture of left femur, initial encounter for closed fracture: Secondary | ICD-10-CM | POA: Diagnosis not present

## 2015-11-30 DIAGNOSIS — T148XXA Other injury of unspecified body region, initial encounter: Secondary | ICD-10-CM | POA: Diagnosis not present

## 2015-11-30 DIAGNOSIS — Z833 Family history of diabetes mellitus: Secondary | ICD-10-CM

## 2015-11-30 DIAGNOSIS — R1030 Lower abdominal pain, unspecified: Secondary | ICD-10-CM | POA: Diagnosis not present

## 2015-11-30 DIAGNOSIS — G309 Alzheimer's disease, unspecified: Secondary | ICD-10-CM | POA: Diagnosis not present

## 2015-11-30 DIAGNOSIS — Z823 Family history of stroke: Secondary | ICD-10-CM

## 2015-11-30 DIAGNOSIS — Y92129 Unspecified place in nursing home as the place of occurrence of the external cause: Secondary | ICD-10-CM

## 2015-11-30 DIAGNOSIS — M25552 Pain in left hip: Secondary | ICD-10-CM | POA: Diagnosis not present

## 2015-11-30 DIAGNOSIS — M810 Age-related osteoporosis without current pathological fracture: Secondary | ICD-10-CM | POA: Diagnosis present

## 2015-11-30 DIAGNOSIS — D649 Anemia, unspecified: Secondary | ICD-10-CM | POA: Diagnosis present

## 2015-11-30 DIAGNOSIS — E785 Hyperlipidemia, unspecified: Secondary | ICD-10-CM | POA: Diagnosis present

## 2015-11-30 DIAGNOSIS — Z79899 Other long term (current) drug therapy: Secondary | ICD-10-CM

## 2015-11-30 DIAGNOSIS — I482 Chronic atrial fibrillation: Secondary | ICD-10-CM | POA: Diagnosis not present

## 2015-11-30 DIAGNOSIS — I10 Essential (primary) hypertension: Secondary | ICD-10-CM | POA: Diagnosis present

## 2015-11-30 DIAGNOSIS — E440 Moderate protein-calorie malnutrition: Secondary | ICD-10-CM | POA: Insufficient documentation

## 2015-11-30 DIAGNOSIS — G4700 Insomnia, unspecified: Secondary | ICD-10-CM | POA: Diagnosis present

## 2015-11-30 DIAGNOSIS — S72009A Fracture of unspecified part of neck of unspecified femur, initial encounter for closed fracture: Secondary | ICD-10-CM | POA: Diagnosis present

## 2015-11-30 DIAGNOSIS — Z993 Dependence on wheelchair: Secondary | ICD-10-CM

## 2015-11-30 DIAGNOSIS — F329 Major depressive disorder, single episode, unspecified: Secondary | ICD-10-CM | POA: Diagnosis present

## 2015-11-30 DIAGNOSIS — Z66 Do not resuscitate: Secondary | ICD-10-CM | POA: Diagnosis present

## 2015-11-30 DIAGNOSIS — W050XXA Fall from non-moving wheelchair, initial encounter: Secondary | ICD-10-CM | POA: Diagnosis present

## 2015-11-30 DIAGNOSIS — Z7901 Long term (current) use of anticoagulants: Secondary | ICD-10-CM

## 2015-11-30 DIAGNOSIS — W19XXXA Unspecified fall, initial encounter: Secondary | ICD-10-CM

## 2015-11-30 DIAGNOSIS — R220 Localized swelling, mass and lump, head: Secondary | ICD-10-CM | POA: Diagnosis not present

## 2015-11-30 DIAGNOSIS — S72002A Fracture of unspecified part of neck of left femur, initial encounter for closed fracture: Secondary | ICD-10-CM

## 2015-11-30 LAB — TSH: TSH: 0.2 u[IU]/mL — AB (ref 0.41–5.90)

## 2015-11-30 NOTE — ED Provider Notes (Signed)
[facility] DEPT Provider Note   CSN: [id number] Arrival date & time:        History   Chief Complaint Chief Complaint  Patient presents with  . Fall    HPI Marie Robertson is a 82 y.o. female.   Fall  This is a new problem. The current episode started 1 to 2 hours ago. The problem occurs constantly. The problem has not changed since onset.Nothing aggravates the symptoms. Nothing relieves the symptoms. She has tried nothing for the symptoms. The treatment provided no relief.    Past Medical History:  Diagnosis Date  . Chronic atrial fibrillation ([place])   . Chronic renal insufficiency, stage III (moderate)   . Constipation   . CVA (cerebral infarction) approx 2012   residual defect: poor R sided peripheral vision per son  . Depression   . History of fracture of right hip 03/2015  . Hyperlipidemia   . Hypertension   . Hypothyroidism   . Insomnia   . Osteoarthritis    wrists, hands  . Osteoporosis     Patient Active Problem List   Diagnosis Date Noted  . Mood disorder ([place]) 11/19/2015  . Dementia with behavioral disturbance 11/08/2015  . CKD (chronic kidney disease) stage 3, GFR 30-59 ml/min 09/17/2015  . Insomnia 08/18/2015  . Confusion 06/20/2015  . Recent urinary tract infection 06/20/2015  . Dysuria 04/10/2015  . Edema 04/06/2015  . Acute encephalopathy 03/29/2015  . Vitamin D deficiency 03/29/2015  . UTI (lower urinary tract infection) 03/21/2015  . Hip fracture ([employer] [facility]) 03/20/2015  . Chronic atrial fibrillation ([place]) 02/20/2015  . Hyperlipidemia 02/20/2015  . Essential hypertension, benign 12/13/2012  . Cardiac arrhythmia 12/13/2012  . Urinary incontinence 12/13/2012  . Hypothyroidism 12/13/2012  . History of depression 12/11/2012    Past Surgical History:  Procedure Laterality Date  . APPENDECTOMY    . COLONOSCOPY  08/22/08  . INTRAMEDULLARY (IM) NAIL INTERTROCHANTERIC Right 03/22/2015   Procedure: RIGHT femoral HIP NAILING;  Surgeon: Gaynelle Arabian, MD;  Location: [facility];  Service: Orthopedics;  Laterality: Right;  . KNEE ARTHROSCOPY Right   . THYROID SURGERY      OB History    No data available       Home Medications    Prior to Admission medications   Medication Sig Start Date End Date Taking? Authorizing Provider  acetaminophen (TYLENOL) 500 MG tablet Take 2 tablets (1,000 mg total) by mouth every 6 (six) hours as needed for mild pain, moderate pain or headache. 08/06/15  Yes Kristen N Ward, DO  apixaban (ELIQUIS) 2.5 MG TABS tablet Take 2.5 mg by mouth 2 (two) times daily. 9am and 5pm   Yes Historical Provider, MD  atenolol (TENORMIN) 50 MG tablet Take 50 mg by mouth daily.   Yes Historical Provider, MD  Cholecalciferol (VITAMIN D-3) 1000 units CAPS Take 3,000 Units by mouth daily.    Yes Historical Provider, MD  feeding supplement (BOOST / RESOURCE BREEZE) LIQD Take 1 Container by mouth 2 (two) times daily between meals. 10am and 2pm   Yes Historical Provider, MD  levothyroxine (SYNTHROID, LEVOTHROID) 50 MCG tablet Take 50 mcg by mouth daily before breakfast.   Yes Historical Provider, MD  LORazepam (ATIVAN) 0.5 MG tablet Take 0.5 mg by mouth 3 (three) times daily as needed (agitation).    Yes Historical Provider, MD  ondansetron (ZOFRAN-ODT) 4 MG disintegrating tablet Take 4 mg by mouth every 8 (eight) hours as needed for nausea or vomiting.   Yes Historical Provider,  MD  pravastatin (PRAVACHOL) 40 MG tablet Take 1 tablet (40 mg total) by mouth at bedtime. 02/23/15  Yes Tammi Sou, MD  QUEtiapine (SEROQUEL) 25 MG tablet Take 25 mg by mouth daily.   Yes Historical Provider, MD  QUEtiapine (SEROQUEL) 50 MG tablet Take 50 mg by mouth at bedtime.   Yes Historical Provider, MD  senna (SENOKOT) 8.6 MG tablet Take 1 tablet by mouth at bedtime as needed for constipation.    Yes Historical Provider, MD  traMADol (ULTRAM) 50 MG tablet 1 tab po q6h prn pain Patient taking differently: Take 50 mg by mouth every 6 (six) hours  as needed (pain).  06/09/15  Yes Tammi Sou, MD  zolpidem (AMBIEN) 5 MG tablet Take 1 tablet (5 mg total) by mouth at bedtime. Patient taking differently: Take 5 mg by mouth at bedtime as needed for sleep.  10/03/15  Yes Lauree Chandler, NP    Family History Family History  Problem Relation Age of Onset  . Stroke Mother   . Diabetes Mother   . Alcohol abuse Father     Social History Social History  Substance Use Topics  . Smoking status: Never Smoker  . Smokeless tobacco: Never Used  . Alcohol use No     Allergies   Patient has no known allergies.   Review of Systems Review of Systems  Unable to perform ROS: Dementia  All other systems reviewed and are negative.    Physical Exam Updated Vital Signs BP 148/95   Pulse 87   Ht 5\' 5"  (1.651 m)   Wt 52.2 kg   LMP 01/08/1972   SpO2 100%   BMI 19.14 kg/m   Physical Exam  Constitutional: She appears well-nourished. She is cooperative. She does not appear ill. No distress.  HENT:  Head: Normocephalic and atraumatic. Head is without raccoon's eyes and without Battle's sign.  Right Ear: Tympanic membrane normal.  Left Ear: Tympanic membrane normal.  Eyes: Conjunctivae are normal.  Neck: Full passive range of motion without pain. Neck supple. No spinous process tenderness present.  Cardiovascular: Normal rate and regular rhythm.   No murmur heard. Pulmonary/Chest: Effort normal and breath sounds normal. No respiratory distress. She has no decreased breath sounds. She has no wheezes. She exhibits no tenderness.  Abdominal: Soft. There is no tenderness. There is no rebound and no guarding.  Musculoskeletal: She exhibits no edema.       Right hip: She exhibits normal strength, no tenderness and no bony tenderness.       Left hip: She exhibits normal strength, no tenderness and no bony tenderness.  Neurological: She is alert.  Skin: Skin is warm and dry.  Psychiatric: She has a normal mood and affect.  Nursing note  and vitals reviewed.    ED Treatments / Results  Labs (all labs ordered are listed, but only abnormal results are displayed) Labs Reviewed  URINALYSIS, DIPSTICK ONLY  I-STAT CHEM 8, ED    EKG  EKG Interpretation  Date/Time:  Thursday November 30 2015 23:02:45 EST Ventricular Rate:  83 PR Interval:    QRS Duration: 150 QT Interval:  402 QTC Calculation: 473 R Axis:   107 Text Interpretation:  Atrial fibrillation RBBB and LPFB Interpretation limited secondary to artifact Confirmed by KNOTT MD, DANIEL [id number]) on 11/30/2015 11:36:10 PM       Radiology Dg Chest 1 View  Result Date: 11/30/2015 CLINICAL DATA:  Status post unwitnessed fall, with concern for chest injury. Initial encounter.  EXAM: CHEST 1 VIEW COMPARISON:  Chest radiograph performed 06/04/2015 FINDINGS: The lungs are mildly hypoexpanded. Vascular crowding and vascular congestion are seen. Mild bilateral atelectasis is seen. No pleural effusion or pneumothorax is identified. The cardiomediastinal silhouette is enlarged. No displaced rib fractures are seen. Degenerative change is noted about the right glenohumeral joint, with chronic superior subluxation of the right humeral head. A moderate hiatal hernia is suspected. IMPRESSION: 1. Lungs mildly hypoexpanded, with mild bilateral atelectasis. 2. Vascular congestion and cardiomegaly. 3. No displaced rib fracture seen. 4. Moderate hiatal hernia suspected. Electronically Signed   By: Garald Balding M.D.   On: 11/30/2015 23:58   Dg Pelvis 1-2 Views  Result Date: 12/01/2015 CLINICAL DATA:  Status post unwitnessed fall, with concern for pelvic injury. Intermittent left groin pain. Initial encounter. EXAM: PELVIS - 1-2 VIEW COMPARISON:  Pelvic radiograph performed 03/15/2015 FINDINGS: There is new cortical irregularity at the superior aspect of the left femoral neck, raising concern for a subcapital fracture of the left femoral neck. Would correlate for associated symptoms. Both  femoral heads are seated normally within their respective acetabula. The patient's right femoral hardware is grossly unremarkable, though incompletely imaged. Mild degenerative change is noted at the pubic symphysis. The visualized bowel gas pattern is grossly unremarkable in appearance. IMPRESSION: New cortical irregularity at the superior aspect of the left femoral neck, raising concern for a subcapital fracture of the left femoral neck. Would correlate for associated symptoms. Electronically Signed   By: Garald Balding M.D.   On: 12/01/2015 00:01   Ct Head Wo Contrast  Result Date: 11/30/2015 CLINICAL DATA:  Status post fall, with concern for head injury. Initial encounter. EXAM: CT HEAD WITHOUT CONTRAST TECHNIQUE: Contiguous axial images were obtained from the base of the skull through the vertex without intravenous contrast. COMPARISON:  None. FINDINGS: Brain: No evidence of acute infarction, hemorrhage, hydrocephalus, extra-axial collection or mass lesion/mass effect. Prominence of the ventricles and sulci reflects mild to moderate cortical volume loss, with scattered periventricular and subcortical white matter change likely reflecting small vessel ischemic microangiopathy. A chronic infarct is noted at the left occipital lobe, with associated encephalomalacia. Cerebellar atrophy is noted. The brainstem and fourth ventricle are within normal limits. The basal ganglia are unremarkable in appearance. No mass effect or midline shift is seen. Vascular: No hyperdense vessel or unexpected calcification. Skull: There is no evidence of fracture; visualized osseous structures are unremarkable in appearance. Sinuses/Orbits: The orbits are within normal limits. The paranasal sinuses and mastoid air cells are well-aerated. Other: Soft tissue swelling is noted overlying the right frontal calvarium. IMPRESSION: 1. No evidence of traumatic intracranial injury or fracture. 2. Soft tissue swelling overlying the right  frontal calvarium. 3. Mild to moderate cortical volume loss and scattered small vessel ischemic microangiopathy. 4. Chronic infarct at the left occipital lobe, with associated encephalomalacia. Electronically Signed   By: Garald Balding M.D.   On: 11/30/2015 23:35    Procedures Procedures (including critical care time)  Medications Ordered in ED Medications - No data to display   Initial Impression / Assessment and Plan / ED Course  I have reviewed the triage vital signs and the nursing notes.  Pertinent labs & imaging results that were available during my care of the patient were reviewed by me and considered in my medical decision making (see chart for details).  Clinical Course    82 year old female with a history of dementia reports reports after an unwitnessed fall. She was found on the ground. Commentary  in the documentation that she may have had groin pain. Pelvic films will be shot. Chest x-ray will also be shot. Patient is unable to reliably tell where she is hurting at this moment. Nor she able to give any details of the fall. She is on anticoagulation with eliquis. We will get a CT of her head. EKG shows no specific P waves, with history of cardiac arrhythmia. No acute signs of ischemia interval abnormality. Mickel Baas urine dip and a chem 8. No focal injuries found on exam. Patient's lungs are clear heart sounds are irregular however no other abnormal sounds. Will reassess patient after imaging and laboratory work.  CT head is without any acute intracranial abnormalities. Patient has a pelvic film that is equivocal for possible left subcapital fracture. We'll get a CT to evaluate as the patient is unlikely to be able to lay still and get good view with plain films.  Son is POA, [phone] [name], attempted to reach but unable to. I am told he is coming here this evening by nursing home staff.   Patient care is handed off to Dr.Oni at [facility], H&P and plan are communicated. Once CT is  completed if there is a fracture consult orthopedics. If not the patient be discharged. Vital signs stable time and off. For the remainder this patient's care and ultimate disposition please see Dr. Claudine Mouton' Note  Final Clinical Impressions(s) / ED Diagnoses   Final diagnoses:  Fall    New Prescriptions New Prescriptions   No medications on file     Dewaine Conger, MD 12/01/15 0023    Leo Grosser, MD 12/01/15 [phone]

## 2015-11-30 NOTE — ED Notes (Signed)
EMS gave 50mcg fentanyl

## 2015-11-30 NOTE — ED Notes (Signed)
Pt still at x ray

## 2015-11-30 NOTE — ED Triage Notes (Signed)
Pt arrived via [facility] from [facility] c/o unwitnessed fall.  Pt found on floor alert next to wheel chair, staff moved pt from floor to bed.  C/o intermittent groin pain, unknown LOC.

## 2015-12-01 ENCOUNTER — Emergency Department (HOSPITAL_COMMUNITY): Payer: Medicare Other

## 2015-12-01 ENCOUNTER — Encounter (HOSPITAL_COMMUNITY): Payer: Self-pay | Admitting: *Deleted

## 2015-12-01 DIAGNOSIS — E039 Hypothyroidism, unspecified: Secondary | ICD-10-CM | POA: Diagnosis not present

## 2015-12-01 DIAGNOSIS — S72002D Fracture of unspecified part of neck of left femur, subsequent encounter for closed fracture with routine healing: Secondary | ICD-10-CM

## 2015-12-01 DIAGNOSIS — Y92129 Unspecified place in nursing home as the place of occurrence of the external cause: Secondary | ICD-10-CM | POA: Diagnosis not present

## 2015-12-01 DIAGNOSIS — I10 Essential (primary) hypertension: Secondary | ICD-10-CM | POA: Diagnosis not present

## 2015-12-01 DIAGNOSIS — Z993 Dependence on wheelchair: Secondary | ICD-10-CM | POA: Diagnosis not present

## 2015-12-01 DIAGNOSIS — Z79899 Other long term (current) drug therapy: Secondary | ICD-10-CM | POA: Diagnosis not present

## 2015-12-01 DIAGNOSIS — F0390 Unspecified dementia without behavioral disturbance: Secondary | ICD-10-CM | POA: Diagnosis not present

## 2015-12-01 DIAGNOSIS — S0003XA Contusion of scalp, initial encounter: Secondary | ICD-10-CM | POA: Diagnosis not present

## 2015-12-01 DIAGNOSIS — D649 Anemia, unspecified: Secondary | ICD-10-CM | POA: Diagnosis present

## 2015-12-01 DIAGNOSIS — Z833 Family history of diabetes mellitus: Secondary | ICD-10-CM | POA: Diagnosis not present

## 2015-12-01 DIAGNOSIS — S72012A Unspecified intracapsular fracture of left femur, initial encounter for closed fracture: Secondary | ICD-10-CM | POA: Diagnosis present

## 2015-12-01 DIAGNOSIS — W050XXA Fall from non-moving wheelchair, initial encounter: Secondary | ICD-10-CM | POA: Diagnosis present

## 2015-12-01 DIAGNOSIS — Z823 Family history of stroke: Secondary | ICD-10-CM | POA: Diagnosis not present

## 2015-12-01 DIAGNOSIS — M810 Age-related osteoporosis without current pathological fracture: Secondary | ICD-10-CM | POA: Diagnosis present

## 2015-12-01 DIAGNOSIS — M25552 Pain in left hip: Secondary | ICD-10-CM | POA: Diagnosis not present

## 2015-12-01 DIAGNOSIS — F0280 Dementia in other diseases classified elsewhere without behavioral disturbance: Secondary | ICD-10-CM | POA: Diagnosis present

## 2015-12-01 DIAGNOSIS — Z8673 Personal history of transient ischemic attack (TIA), and cerebral infarction without residual deficits: Secondary | ICD-10-CM | POA: Diagnosis not present

## 2015-12-01 DIAGNOSIS — S72009A Fracture of unspecified part of neck of unspecified femur, initial encounter for closed fracture: Secondary | ICD-10-CM | POA: Diagnosis not present

## 2015-12-01 DIAGNOSIS — S72002A Fracture of unspecified part of neck of left femur, initial encounter for closed fracture: Secondary | ICD-10-CM

## 2015-12-01 DIAGNOSIS — N183 Chronic kidney disease, stage 3 (moderate): Secondary | ICD-10-CM | POA: Diagnosis present

## 2015-12-01 DIAGNOSIS — Z66 Do not resuscitate: Secondary | ICD-10-CM | POA: Diagnosis present

## 2015-12-01 DIAGNOSIS — E785 Hyperlipidemia, unspecified: Secondary | ICD-10-CM | POA: Diagnosis present

## 2015-12-01 DIAGNOSIS — E440 Moderate protein-calorie malnutrition: Secondary | ICD-10-CM | POA: Insufficient documentation

## 2015-12-01 DIAGNOSIS — F329 Major depressive disorder, single episode, unspecified: Secondary | ICD-10-CM | POA: Diagnosis present

## 2015-12-01 DIAGNOSIS — J9811 Atelectasis: Secondary | ICD-10-CM | POA: Diagnosis not present

## 2015-12-01 DIAGNOSIS — I482 Chronic atrial fibrillation: Secondary | ICD-10-CM | POA: Diagnosis present

## 2015-12-01 DIAGNOSIS — I129 Hypertensive chronic kidney disease with stage 1 through stage 4 chronic kidney disease, or unspecified chronic kidney disease: Secondary | ICD-10-CM | POA: Diagnosis present

## 2015-12-01 DIAGNOSIS — R1030 Lower abdominal pain, unspecified: Secondary | ICD-10-CM | POA: Diagnosis not present

## 2015-12-01 DIAGNOSIS — Z7901 Long term (current) use of anticoagulants: Secondary | ICD-10-CM | POA: Diagnosis not present

## 2015-12-01 DIAGNOSIS — R531 Weakness: Secondary | ICD-10-CM | POA: Diagnosis not present

## 2015-12-01 DIAGNOSIS — G4700 Insomnia, unspecified: Secondary | ICD-10-CM | POA: Diagnosis present

## 2015-12-01 DIAGNOSIS — R220 Localized swelling, mass and lump, head: Secondary | ICD-10-CM | POA: Diagnosis not present

## 2015-12-01 DIAGNOSIS — G309 Alzheimer's disease, unspecified: Secondary | ICD-10-CM | POA: Diagnosis present

## 2015-12-01 HISTORY — DX: Fracture of unspecified part of neck of unspecified femur, initial encounter for closed fracture: S72.009A

## 2015-12-01 LAB — CBC WITH DIFFERENTIAL/PLATELET
Basophils Absolute: 0 10*3/uL (ref 0.0–0.1)
Basophils Absolute: 0 10*3/uL (ref 0.0–0.1)
Basophils Relative: 0 %
Basophils Relative: 0 %
EOS ABS: 0 10*3/uL (ref 0.0–0.7)
EOS PCT: 0 %
EOS PCT: 0 %
Eosinophils Absolute: 0 10*3/uL (ref 0.0–0.7)
HCT: 35.1 % — ABNORMAL LOW (ref 36.0–46.0)
HEMATOCRIT: 36.6 % (ref 36.0–46.0)
HEMOGLOBIN: 11.2 g/dL — AB (ref 12.0–15.0)
Hemoglobin: 11.9 g/dL — ABNORMAL LOW (ref 12.0–15.0)
LYMPHS ABS: 1.6 10*3/uL (ref 0.7–4.0)
LYMPHS PCT: 5 %
Lymphocytes Relative: 16 %
Lymphs Abs: 0.6 10*3/uL — ABNORMAL LOW (ref 0.7–4.0)
MCH: 28.8 pg (ref 26.0–34.0)
MCH: 29 pg (ref 26.0–34.0)
MCHC: 31.9 g/dL (ref 30.0–36.0)
MCHC: 32.5 g/dL (ref 30.0–36.0)
MCV: 90.2 fL (ref 78.0–100.0)
MCV: 89.3 fL (ref 78.0–100.0)
MONO ABS: 0.5 10*3/uL (ref 0.1–1.0)
MONO ABS: 0.8 10*3/uL (ref 0.1–1.0)
MONOS PCT: 5 %
MONOS PCT: 6 %
NEUTROS ABS: 11 10*3/uL — AB (ref 1.7–7.7)
NEUTROS PCT: 79 %
Neutro Abs: 7.8 10*3/uL — ABNORMAL HIGH (ref 1.7–7.7)
Neutrophils Relative %: 89 %
PLATELETS: 248 10*3/uL (ref 150–400)
Platelets: 242 10*3/uL (ref 150–400)
RBC: 3.89 MIL/uL (ref 3.87–5.11)
RBC: 4.1 MIL/uL (ref 3.87–5.11)
RDW: 17 % — AB (ref 11.5–15.5)
RDW: 16.8 % — AB (ref 11.5–15.5)
WBC: 10 10*3/uL (ref 4.0–10.5)
WBC: 12.4 10*3/uL — ABNORMAL HIGH (ref 4.0–10.5)

## 2015-12-01 LAB — I-STAT CHEM 8, ED
BUN: 46 mg/dL — ABNORMAL HIGH (ref 6–20)
CALCIUM ION: 1.08 mmol/L — AB (ref 1.15–1.40)
CHLORIDE: 109 mmol/L (ref 101–111)
Creatinine, Ser: 1.7 mg/dL — ABNORMAL HIGH (ref 0.44–1.00)
GLUCOSE: 111 mg/dL — AB (ref 65–99)
HCT: 35 % — ABNORMAL LOW (ref 36.0–46.0)
Hemoglobin: 11.9 g/dL — ABNORMAL LOW (ref 12.0–15.0)
Potassium: 4.4 mmol/L (ref 3.5–5.1)
SODIUM: 141 mmol/L (ref 135–145)
TCO2: 23 mmol/L (ref 0–100)

## 2015-12-01 LAB — COMPREHENSIVE METABOLIC PANEL
ALT: 16 U/L (ref 14–54)
ANION GAP: 9 (ref 5–15)
AST: 24 U/L (ref 15–41)
Albumin: 3.6 g/dL (ref 3.5–5.0)
Alkaline Phosphatase: 75 U/L (ref 38–126)
BILIRUBIN TOTAL: 0.6 mg/dL (ref 0.3–1.2)
BUN: 40 mg/dL — AB (ref 6–20)
CO2: 24 mmol/L (ref 22–32)
Calcium: 9.1 mg/dL (ref 8.9–10.3)
Chloride: 108 mmol/L (ref 101–111)
Creatinine, Ser: 1.64 mg/dL — ABNORMAL HIGH (ref 0.44–1.00)
GFR, EST AFRICAN AMERICAN: 30 mL/min — AB (ref >60)
GFR, EST NON AFRICAN AMERICAN: 26 mL/min — AB (ref >60)
Glucose, Bld: 175 mg/dL — ABNORMAL HIGH (ref 65–99)
POTASSIUM: 4.2 mmol/L (ref 3.5–5.1)
Sodium: 141 mmol/L (ref 135–145)
TOTAL PROTEIN: 6.9 g/dL (ref 6.5–8.1)

## 2015-12-01 LAB — CBC AND DIFFERENTIAL
HEMATOCRIT: 35 % — AB (ref 36–46)
HEMATOCRIT: 35 % — AB (ref 36–46)
HEMATOCRIT: 37 % (ref 36–46)
Hemoglobin: 11.2 g/dL — AB (ref 12.0–16.0)
Hemoglobin: 11.9 g/dL — AB (ref 12.0–16.0)
Hemoglobin: 11.9 g/dL — AB (ref 12.0–16.0)
Platelets: 242 10*3/uL (ref 150–399)
Platelets: 248 10*3/uL (ref 150–399)
WBC: 12.4 10^3/mL
WBC: 10 10*3/mL

## 2015-12-01 LAB — BASIC METABOLIC PANEL
BUN: 40 mg/dL — AB (ref 4–21)
BUN: 46 mg/dL — AB (ref 4–21)
CREATININE: 1.6 mg/dL — AB (ref 0.5–1.1)
Creatinine: 1.7 mg/dL — AB (ref 0.5–1.1)
Glucose: 111 mg/dL
Glucose: 175 mg/dL
POTASSIUM: 4.4 mmol/L (ref 3.4–5.3)
SODIUM: 141 mmol/L (ref 137–147)
Sodium: 141 mmol/L (ref 137–147)

## 2015-12-01 LAB — HEPATIC FUNCTION PANEL: Bilirubin, Total: 0.6 mg/dL

## 2015-12-01 LAB — TYPE AND SCREEN
ABO/RH(D): O POS
Antibody Screen: NEGATIVE

## 2015-12-01 LAB — ABO/RH: ABO/RH(D): O POS

## 2015-12-01 MED ORDER — SENNA 8.6 MG PO TABS: 1.0000 | ORAL_TABLET | Freq: Every evening | ORAL | Status: DC | PRN

## 2015-12-01 MED ORDER — OXYCODONE-ACETAMINOPHEN 5-325 MG PO TABS
1.0000 | ORAL_TABLET | ORAL | Status: DC | PRN
Start: 2015-12-01 — End: 2015-12-02
  Administered 2015-12-01 – 2015-12-02 (×3): 1 via ORAL
  Filled 2015-12-01 (×3): qty 1

## 2015-12-01 MED ORDER — ATENOLOL 50 MG PO TABS
50.0000 mg | ORAL_TABLET | Freq: Every day | ORAL | Status: DC
  Administered 2015-12-01 – 2015-12-02 (×2): 50 mg via ORAL
  Filled 2015-12-01 (×2): qty 1

## 2015-12-01 MED ORDER — LEVOTHYROXINE SODIUM 50 MCG PO TABS
50.0000 ug | ORAL_TABLET | Freq: Every day | ORAL | Status: DC
  Administered 2015-12-01 – 2015-12-02 (×2): 50 ug via ORAL
  Filled 2015-12-01 (×2): qty 1

## 2015-12-01 MED ORDER — OXYCODONE HCL 5 MG PO TABS: 5.0000 mg | ORAL_TABLET | ORAL | Status: DC | PRN

## 2015-12-01 MED ORDER — QUETIAPINE FUMARATE 50 MG PO TABS
50.0000 mg | ORAL_TABLET | Freq: Every day | ORAL | Status: DC
  Administered 2015-12-01: 50 mg via ORAL
  Filled 2015-12-01 (×2): qty 1

## 2015-12-01 MED ORDER — ONDANSETRON 4 MG PO TBDP
4.0000 mg | ORAL_TABLET | Freq: Three times a day (TID) | ORAL | Status: DC | PRN
  Filled 2015-12-01: qty 1

## 2015-12-01 MED ORDER — QUETIAPINE FUMARATE 25 MG PO TABS
25.0000 mg | ORAL_TABLET | Freq: Every day | ORAL | Status: DC
  Administered 2015-12-01 – 2015-12-02 (×2): 25 mg via ORAL
  Filled 2015-12-01 (×2): qty 1

## 2015-12-01 MED ORDER — ZOLPIDEM TARTRATE 5 MG PO TABS: 5.0000 mg | ORAL_TABLET | Freq: Every evening | ORAL | Status: DC | PRN

## 2015-12-01 MED ORDER — FENTANYL CITRATE (PF) 100 MCG/2ML IJ SOLN
50.0000 ug | Freq: Once | INTRAMUSCULAR | Status: AC
  Administered 2015-12-01: 50 ug via INTRAVENOUS
  Filled 2015-12-01: qty 2

## 2015-12-01 MED ORDER — PRAVASTATIN SODIUM 40 MG PO TABS
40.0000 mg | ORAL_TABLET | Freq: Every day | ORAL | Status: DC
  Administered 2015-12-01: 40 mg via ORAL
  Filled 2015-12-01: qty 1

## 2015-12-01 MED ORDER — ENSURE ENLIVE PO LIQD
237.0000 mL | Freq: Two times a day (BID) | ORAL | Status: DC
  Administered 2015-12-01 – 2015-12-02 (×2): 237 mL via ORAL

## 2015-12-01 MED ORDER — MORPHINE SULFATE (PF) 2 MG/ML IV SOLN
0.2500 mg | INTRAVENOUS | Status: DC | PRN
  Administered 2015-12-01: 0.25 mg via INTRAVENOUS
  Filled 2015-12-01: qty 1

## 2015-12-01 MED ORDER — LORAZEPAM 0.5 MG PO TABS
0.5000 mg | ORAL_TABLET | Freq: Three times a day (TID) | ORAL | Status: DC | PRN
  Administered 2015-12-02: 0.5 mg via ORAL
  Filled 2015-12-01: qty 1

## 2015-12-01 MED ORDER — BOOST / RESOURCE BREEZE PO LIQD
1.0000 | Freq: Two times a day (BID) | ORAL | Status: DC
  Administered 2015-12-01 (×2): 1 via ORAL

## 2015-12-01 NOTE — ED Provider Notes (Signed)
1:26 AM patient signed out to me as pending CT scan for possible hip fracture. X-ray has already resulted as a left femoral neck fracture. I spoke with Dr. Percell Miller who agrees with my assessment and that the patient is elderly, demented, and cannot ambulate at baseline. Surgery may not be indicated in her. He asked me to speak with the son who makes her decisions before admitting the patient. I called the son 3 times over the course of one hour without any response. At this time I think it is prudent to have the patient admitted until the swelling can be reached, and also to further control her pain. I had to re-dose the patient with fentanyl in  the emergency department for good control. I spoke with the hospitalist who accepts the patient for further care.   Everlene Balls, MD 12/01/15 [phone]

## 2015-12-01 NOTE — H&P (Signed)
History and Physical    Marie Robertson [id number] DOB: [birth year] DOA: 11/30/2015  PCP: PROVIDER NOT IN SYSTEM  Patient coming from: Nursing home.  Chief Complaint: Fall.  HPI: [name] is a 82 y.o. female with dementia, atrial fibrillation, hypothyroidism, hyperlipidemia was brought to the ER after patient had a fall at the nursing home which was unwitnessed. Patient has dementia and does not contribute to the history. In the ER patient had CT scan of the head and and CT of the left hip which shows left hip fracture. Dr. Percell Miller on call orthopedic surgeon was consulted. Patient is being admitted for further management. Patient is on apixaban last dose taken was yesterday evening 5 PM. On my exam patient denies any chest pain or shortness of breath.   ED Course: Was given fentanyl for pain relief. CT scan as described above showed left hip fracture.  Review of Systems: As per HPI, rest all negative.   Past Medical History:  Diagnosis Date  . Chronic atrial fibrillation ([place])   . Chronic renal insufficiency, stage III (moderate)   . Constipation   . CVA (cerebral infarction) approx 2012   residual defect: poor R sided peripheral vision per son  . Depression   . History of fracture of right hip 03/2015  . Hyperlipidemia   . Hypertension   . Hypothyroidism   . Insomnia   . Osteoarthritis    wrists, hands  . Osteoporosis     Past Surgical History:  Procedure Laterality Date  . APPENDECTOMY    . COLONOSCOPY  08/22/08  . INTRAMEDULLARY (IM) NAIL INTERTROCHANTERIC Right 03/22/2015   Procedure: RIGHT femoral HIP NAILING;  Surgeon: Gaynelle Arabian, MD;  Location: [facility];  Service: Orthopedics;  Laterality: Right;  . KNEE ARTHROSCOPY Right   . THYROID SURGERY       reports that she has never smoked. She has never used smokeless tobacco. She reports that she does not drink alcohol or use drugs.  No Known Allergies  Family History  Problem Relation Age of Onset  .  Stroke Mother   . Diabetes Mother   . Alcohol abuse Father     Prior to Admission medications   Medication Sig Start Date End Date Taking? Authorizing Provider  acetaminophen (TYLENOL) 500 MG tablet Take 2 tablets (1,000 mg total) by mouth every 6 (six) hours as needed for mild pain, moderate pain or headache. 08/06/15  Yes Kristen N Ward, DO  apixaban (ELIQUIS) 2.5 MG TABS tablet Take 2.5 mg by mouth 2 (two) times daily. 9am and 5pm   Yes Historical Provider, MD  atenolol (TENORMIN) 50 MG tablet Take 50 mg by mouth daily.   Yes Historical Provider, MD  Cholecalciferol (VITAMIN D-3) 1000 units CAPS Take 3,000 Units by mouth daily.    Yes Historical Provider, MD  feeding supplement (BOOST / RESOURCE BREEZE) LIQD Take 1 Container by mouth 2 (two) times daily between meals. 10am and 2pm   Yes Historical Provider, MD  levothyroxine (SYNTHROID, LEVOTHROID) 50 MCG tablet Take 50 mcg by mouth daily before breakfast.   Yes Historical Provider, MD  LORazepam (ATIVAN) 0.5 MG tablet Take 0.5 mg by mouth 3 (three) times daily as needed (agitation).    Yes Historical Provider, MD  ondansetron (ZOFRAN-ODT) 4 MG disintegrating tablet Take 4 mg by mouth every 8 (eight) hours as needed for nausea or vomiting.   Yes Historical Provider, MD  pravastatin (PRAVACHOL) 40 MG tablet Take 1 tablet (40 mg total) by mouth at  bedtime. 02/23/15  Yes Tammi Sou, MD  QUEtiapine (SEROQUEL) 25 MG tablet Take 25 mg by mouth daily.   Yes Historical Provider, MD  QUEtiapine (SEROQUEL) 50 MG tablet Take 50 mg by mouth at bedtime.   Yes Historical Provider, MD  senna (SENOKOT) 8.6 MG tablet Take 1 tablet by mouth at bedtime as needed for constipation.    Yes Historical Provider, MD  traMADol (ULTRAM) 50 MG tablet 1 tab po q6h prn pain Patient taking differently: Take 50 mg by mouth every 6 (six) hours as needed (pain).  06/09/15  Yes Tammi Sou, MD  zolpidem (AMBIEN) 5 MG tablet Take 1 tablet (5 mg total) by mouth at  bedtime. Patient taking differently: Take 5 mg by mouth at bedtime as needed for sleep.  10/03/15  Yes Lauree Chandler, NP    Physical Exam: Vitals:   11/30/15 2228 11/30/15 2230 11/30/15 2300 12/01/15 0240  BP:  140/80 148/95 (!) 136/123  Pulse:  88 87 (!) 56  Temp:    98 F (36.7 C)  TempSrc:    Axillary  SpO2: 91% 97% 100% (!) 85%  Weight:      Height:          Constitutional: Moderately built and nourished. Vitals:   11/30/15 2228 11/30/15 2230 11/30/15 2300 12/01/15 0240  BP:  140/80 148/95 (!) 136/123  Pulse:  88 87 (!) 56  Temp:    98 F (36.7 C)  TempSrc:    Axillary  SpO2: 91% 97% 100% (!) 85%  Weight:      Height:       Eyes: Anicteric no pallor. ENMT: No discharge from the ears eyes nose or mouth. Neck: No mass felt. No neck rigidity. Respiratory: No rhonchi or crepitations. Cardiovascular: S1-S2 heard. Abdomen: Soft nontender bowel sounds present. No guarding or rigidity. Musculoskeletal: Pain on moving her left hip. Skin: No rash. Neurologic: Alert awake oriented to her name. Moves all extremities. Psychiatric: Has dementia.   Labs on Admission: I have personally reviewed following labs and imaging studies  CBC:  Recent Labs Lab 12/01/15 0029 12/01/15 0111  WBC  --  10.0  NEUTROABS  --  7.8*  HGB 11.9* 11.2*  HCT 35.0* 35.1*  MCV  --  90.2  PLT  --  [id number]   Basic Metabolic Panel:  Recent Labs Lab 12/01/15 0029  NA 141  K 4.4  CL 109  GLUCOSE 111*  BUN 46*  CREATININE 1.70*   GFR: Estimated Creatinine Clearance: 17 mL/min (by C-G formula based on SCr of 1.7 mg/dL (H)). Liver Function Tests: No results for input(s): AST, ALT, ALKPHOS, BILITOT, PROT, ALBUMIN in the last 168 hours. No results for input(s): LIPASE, AMYLASE in the last 168 hours. No results for input(s): AMMONIA in the last 168 hours. Coagulation Profile: No results for input(s): INR, PROTIME in the last 168 hours. Cardiac Enzymes: No results for input(s): CKTOTAL,  CKMB, CKMBINDEX, TROPONINI in the last 168 hours. BNP (last 3 results) No results for input(s): PROBNP in the last 8760 hours. HbA1C: No results for input(s): HGBA1C in the last 72 hours. CBG: No results for input(s): GLUCAP in the last 168 hours. Lipid Profile: No results for input(s): CHOL, HDL, LDLCALC, TRIG, CHOLHDL, LDLDIRECT in the last 72 hours. Thyroid Function Tests: No results for input(s): TSH, T4TOTAL, FREET4, T3FREE, THYROIDAB in the last 72 hours. Anemia Panel: No results for input(s): VITAMINB12, FOLATE, FERRITIN, TIBC, IRON, RETICCTPCT in the last 72 hours. Urine analysis:  Component Value Date/Time   COLORURINE YELLOW 08/06/2015 0242   APPEARANCEUR CLOUDY (A) 08/06/2015 0242   LABSPEC 1.010 08/06/2015 0242   PHURINE 6.0 08/06/2015 0242   GLUCOSEU NEGATIVE 08/06/2015 0242   HGBUR NEGATIVE 08/06/2015 0242   BILIRUBINUR NEGATIVE 08/06/2015 0242   BILIRUBINUR negative 06/19/2015 1349   KETONESUR NEGATIVE 08/06/2015 0242   PROTEINUR NEGATIVE 08/06/2015 0242   UROBILINOGEN 0.2 06/19/2015 1349   NITRITE NEGATIVE 08/06/2015 0242   LEUKOCYTESUR TRACE (A) 08/06/2015 0242   Sepsis Labs: @LABRCNTIP (procalcitonin:4,lacticidven:4) )No results found for this or any previous visit (from the past 240 hour(s)).   Radiological Exams on Admission: Dg Chest 1 View  Result Date: 11/30/2015 CLINICAL DATA:  Status post unwitnessed fall, with concern for chest injury. Initial encounter. EXAM: CHEST 1 VIEW COMPARISON:  Chest radiograph performed 06/04/2015 FINDINGS: The lungs are mildly hypoexpanded. Vascular crowding and vascular congestion are seen. Mild bilateral atelectasis is seen. No pleural effusion or pneumothorax is identified. The cardiomediastinal silhouette is enlarged. No displaced rib fractures are seen. Degenerative change is noted about the right glenohumeral joint, with chronic superior subluxation of the right humeral head. A moderate hiatal hernia is suspected.  IMPRESSION: 1. Lungs mildly hypoexpanded, with mild bilateral atelectasis. 2. Vascular congestion and cardiomegaly. 3. No displaced rib fracture seen. 4. Moderate hiatal hernia suspected. Electronically Signed   By: Garald Balding M.D.   On: 11/30/2015 23:58   Dg Pelvis 1-2 Views  Result Date: 12/01/2015 CLINICAL DATA:  Status post unwitnessed fall, with concern for pelvic injury. Intermittent left groin pain. Initial encounter. EXAM: PELVIS - 1-2 VIEW COMPARISON:  Pelvic radiograph performed 03/15/2015 FINDINGS: There is new cortical irregularity at the superior aspect of the left femoral neck, raising concern for a subcapital fracture of the left femoral neck. Would correlate for associated symptoms. Both femoral heads are seated normally within their respective acetabula. The patient's right femoral hardware is grossly unremarkable, though incompletely imaged. Mild degenerative change is noted at the pubic symphysis. The visualized bowel gas pattern is grossly unremarkable in appearance. IMPRESSION: New cortical irregularity at the superior aspect of the left femoral neck, raising concern for a subcapital fracture of the left femoral neck. Would correlate for associated symptoms. Electronically Signed   By: Garald Balding M.D.   On: 12/01/2015 00:01   Ct Head Wo Contrast  Result Date: 11/30/2015 CLINICAL DATA:  Status post fall, with concern for head injury. Initial encounter. EXAM: CT HEAD WITHOUT CONTRAST TECHNIQUE: Contiguous axial images were obtained from the base of the skull through the vertex without intravenous contrast. COMPARISON:  None. FINDINGS: Brain: No evidence of acute infarction, hemorrhage, hydrocephalus, extra-axial collection or mass lesion/mass effect. Prominence of the ventricles and sulci reflects mild to moderate cortical volume loss, with scattered periventricular and subcortical white matter change likely reflecting small vessel ischemic microangiopathy. A chronic infarct is  noted at the left occipital lobe, with associated encephalomalacia. Cerebellar atrophy is noted. The brainstem and fourth ventricle are within normal limits. The basal ganglia are unremarkable in appearance. No mass effect or midline shift is seen. Vascular: No hyperdense vessel or unexpected calcification. Skull: There is no evidence of fracture; visualized osseous structures are unremarkable in appearance. Sinuses/Orbits: The orbits are within normal limits. The paranasal sinuses and mastoid air cells are well-aerated. Other: Soft tissue swelling is noted overlying the right frontal calvarium. IMPRESSION: 1. No evidence of traumatic intracranial injury or fracture. 2. Soft tissue swelling overlying the right frontal calvarium. 3. Mild to moderate cortical volume loss  and scattered small vessel ischemic microangiopathy. 4. Chronic infarct at the left occipital lobe, with associated encephalomalacia. Electronically Signed   By: Garald Balding M.D.   On: 11/30/2015 23:35   Ct Hip Left Wo Contrast  Result Date: 12/01/2015 CLINICAL DATA:  Acute onset of left hip pain, after unwitnessed fall. Initial encounter. EXAM: CT OF THE LEFT HIP WITHOUT CONTRAST TECHNIQUE: Multidetector CT imaging of the left hip was performed according to the standard protocol. Multiplanar CT image reconstructions were also generated. COMPARISON:  Left hip radiographs performed 11/30/2015 FINDINGS: Bones/Joint/Cartilage There is a mildly displaced subcapital fracture through the left femoral neck, with posterior rotation of the distal femur. The femoral heads remain seated within their respective acetabula. Right femoral hardware is only partially imaged and appears grossly unremarkable, with surrounding heterotopic bone formation. No significant joint space narrowing is seen. Degenerative change is noted at the pubic symphysis. Minimal vacuum phenomenon is noted at the sacroiliac joints. Ligaments Suboptimally assessed by CT. Muscles and  Tendons The visualized musculature is unremarkable in appearance. Prominent soft tissue scarring is noted about the right hip. Soft tissues Diffuse vascular calcifications are seen. A 3.2 cm right adnexal cystic lesion is noted. The bladder is moderately distended and grossly unremarkable. Scattered diverticulosis is noted along the sigmoid colon, without evidence of diverticulitis. IMPRESSION: 1. Mildly displaced subcapital fracture through the left femoral neck, with posterior rotation of the distal femur. 2. Diffuse vascular calcifications seen. 3. 3.2 cm right adnexal cystic lesion noted. Pelvic ultrasound could be considered for further evaluation, if deemed clinically appropriate. 4. Scattered diverticulosis along the sigmoid colon, without evidence of diverticulitis. Electronically Signed   By: Garald Balding M.D.   On: 12/01/2015 02:27    EKG: Independently reviewed. A. fib with rate control.  Assessment/Plan Principal Problem:   Femoral neck fracture (HCC) Active Problems:   Essential hypertension, benign   Hyperlipidemia   Hip fracture (HCC)   Insomnia   Closed fracture of neck of left femur (HCC)    1. Left hip fracture status post fall - Dr. Percell Miller, on-call orthopedic surgeon has been consulted and will be evaluating the patient for surgery. Patient will be at moderate risk for intermediate risk procedure. Patient's last dose of Apixaban was yesterday at 5 PM so surgery can be done only after 24 hours of the last dose of Apixaban. Apixaban on hold in anticipation of procedure. 2. Chronic atrial fibrillation - chads 2 vasc score is 3. Apixaban on hold in anticipation of procedure. Continue beta blocker. 3. Hypothyroidism on Synthroid. 4. Hyperlipidemia on statins. 5. Dementia - patient is on Seroquel and as needed Ativan. 6. Chronic anemia - follow CBC. 7. Chronic kidney disease stage III - creatinine mildly worse from baseline. Follow metabolic panel.   DVT prophylaxis:  SCDs. Code Status: DO NOT RESUSCITATE.  Family Communication: No family at the bedside.  Disposition Plan: Skilled nursing facility.  Consults called: Orthopedic surgery.  Admission status: Inpatient.    Rise Patience MD [facility] Pager [phone].  If 7PM-7AM, please contact night-coverage [email or web address] Password [facility]  12/01/2015, 4:23 AM

## 2015-12-01 NOTE — Progress Notes (Signed)
Placed text message to Dr Hal Hope reporting Pt arrived on floor 5N. Requested orders.

## 2015-12-01 NOTE — Progress Notes (Signed)
Initial Nutrition Assessment  DOCUMENTATION CODES:   Non-severe (moderate) malnutrition in context of chronic illness  INTERVENTION:  Discontinue Boost Breeze.  Provide Ensure Enlive po BID, each supplement provides 350 kcal and 20 grams of protein.  Encourage adequate PO intake.   NUTRITION DIAGNOSIS:   Malnutrition related to chronic illness as evidenced by moderate depletion of body fat, moderate depletions of muscle mass.  GOAL:   Patient will meet greater than or equal to 90% of their needs  MONITOR:   PO intake, Supplement acceptance, Labs, Weight trends, Skin, I & O's  REASON FOR ASSESSMENT:   Consult Hip fracture protocol  ASSESSMENT:    82 y.o. female with dementia, atrial fibrillation, hypothyroidism, hyperlipidemia was brought to the ER after patient had a fall at the nursing home which was unwitnessed. Patient has dementia and does not contribute to the history. In the ER patient had CT scan of the head and and CT of the left hip which shows left hip fracture.Per MD note, plans to treat fracture non-operatively.   Pt with dementia. Pt unable to respond to RD questions appropriately. RD unable to obtain nutrition history. No family at bedside. Noted no meal completion recorded, however pt with an opened bottle of Ensure at bedside. Pt currently has Boost Breeze ordered, RD to modify orders to Ensure instead to aid in adequate caloric and protein needs.   Nutrition-Focused physical exam completed. Findings are moderate fat depletion, moderate muscle depletion, and mild edema.   Labs and medications reviewed.   Diet Order:  Diet Heart Room service appropriate? Yes; Fluid consistency: Thin  Skin:  Reviewed, no issues  Last BM:  Unknown  Height:   Ht Readings from Last 1 Encounters:  11/30/15 5\' 5"  (1.651 m)    Weight:   Wt Readings from Last 1 Encounters:  11/30/15 115 lb (52.2 kg)    Ideal Body Weight:  56.8 kg  BMI:  Body mass index is 19.14  kg/m.  Estimated Nutritional Needs:   Kcal:  1400-1600  Protein:  60-70 grams  Fluid:  >/= 1.5 L/day  EDUCATION NEEDS:   No education needs identified at this time  Corrin Parker, MS, RD, LDN Pager # [phone] After hours/ weekend pager # [phone]

## 2015-12-01 NOTE — Consult Note (Signed)
ORTHOPAEDIC CONSULTATION  REQUESTING PHYSICIAN: Doreatha Lew, MD  Chief Complaint: left hip pain  HPI: Marie Robertson is a 82 y.o. female with dementia, a-fibb and hypothyroidism, who presented to [facility] ED last night after an unwitnessed fall.  She resides in a nursing home and is wheelchair bound. She is not complaining of much pain at the time, however, due to the dementia it is difficult to obtain much history.     Past Medical History:  Diagnosis Date  . Chronic atrial fibrillation ([place])   . Chronic renal insufficiency, stage III (moderate)   . Constipation   . CVA (cerebral infarction) approx 2012   residual defect: poor R sided peripheral vision per son  . Depression   . History of fracture of right hip 03/2015  . Hyperlipidemia   . Hypertension   . Hypothyroidism   . Insomnia   . Osteoarthritis    wrists, hands  . Osteoporosis    Past Surgical History:  Procedure Laterality Date  . APPENDECTOMY    . COLONOSCOPY  08/22/08  . INTRAMEDULLARY (IM) NAIL INTERTROCHANTERIC Right 03/22/2015   Procedure: RIGHT femoral HIP NAILING;  Surgeon: Gaynelle Arabian, MD;  Location: [facility];  Service: Orthopedics;  Laterality: Right;  . KNEE ARTHROSCOPY Right   . THYROID SURGERY     Social History   Social History  . Marital status: Widowed    Spouse name: N/A  . Number of children: N/A  . Years of education: N/A   Social History Main Topics  . Smoking status: Never Smoker  . Smokeless tobacco: Never Used  . Alcohol use No  . Drug use: No  . Sexual activity: No   Other Topics Concern  . None   Social History Narrative   Widow.   Educ: 11th grade.   Occupation: retired Insurance claims handler and retired Secretary/administrator.   As of 02/2015, pt is in independent living at [facility] in [place].   No T/A/Ds.   Family History  Problem Relation Age of Onset  . Stroke Mother   . Diabetes Mother   . Alcohol abuse Father    No Known Allergies Prior to Admission medications     Medication Sig Start Date End Date Taking? Authorizing Provider  acetaminophen (TYLENOL) 500 MG tablet Take 2 tablets (1,000 mg total) by mouth every 6 (six) hours as needed for mild pain, moderate pain or headache. 08/06/15  Yes Kristen N Ward, DO  apixaban (ELIQUIS) 2.5 MG TABS tablet Take 2.5 mg by mouth 2 (two) times daily. 9am and 5pm   Yes Historical Provider, MD  atenolol (TENORMIN) 50 MG tablet Take 50 mg by mouth daily.   Yes Historical Provider, MD  Cholecalciferol (VITAMIN D-3) 1000 units CAPS Take 3,000 Units by mouth daily.    Yes Historical Provider, MD  feeding supplement (BOOST / RESOURCE BREEZE) LIQD Take 1 Container by mouth 2 (two) times daily between meals. 10am and 2pm   Yes Historical Provider, MD  levothyroxine (SYNTHROID, LEVOTHROID) 50 MCG tablet Take 50 mcg by mouth daily before breakfast.   Yes Historical Provider, MD  LORazepam (ATIVAN) 0.5 MG tablet Take 0.5 mg by mouth 3 (three) times daily as needed (agitation).    Yes Historical Provider, MD  ondansetron (ZOFRAN-ODT) 4 MG disintegrating tablet Take 4 mg by mouth every 8 (eight) hours as needed for nausea or vomiting.   Yes Historical Provider, MD  pravastatin (PRAVACHOL) 40 MG tablet Take 1 tablet (40 mg total) by mouth  at bedtime. 02/23/15  Yes Tammi Sou, MD  QUEtiapine (SEROQUEL) 25 MG tablet Take 25 mg by mouth daily.   Yes Historical Provider, MD  QUEtiapine (SEROQUEL) 50 MG tablet Take 50 mg by mouth at bedtime.   Yes Historical Provider, MD  senna (SENOKOT) 8.6 MG tablet Take 1 tablet by mouth at bedtime as needed for constipation.    Yes Historical Provider, MD  traMADol (ULTRAM) 50 MG tablet 1 tab po q6h prn pain Patient taking differently: Take 50 mg by mouth every 6 (six) hours as needed (pain).  06/09/15  Yes Tammi Sou, MD  zolpidem (AMBIEN) 5 MG tablet Take 1 tablet (5 mg total) by mouth at bedtime. Patient taking differently: Take 5 mg by mouth at bedtime as needed for sleep.  10/03/15  Yes  Lauree Chandler, NP   Dg Chest 1 View  Result Date: 11/30/2015 CLINICAL DATA:  Status post unwitnessed fall, with concern for chest injury. Initial encounter. EXAM: CHEST 1 VIEW COMPARISON:  Chest radiograph performed 06/04/2015 FINDINGS: The lungs are mildly hypoexpanded. Vascular crowding and vascular congestion are seen. Mild bilateral atelectasis is seen. No pleural effusion or pneumothorax is identified. The cardiomediastinal silhouette is enlarged. No displaced rib fractures are seen. Degenerative change is noted about the right glenohumeral joint, with chronic superior subluxation of the right humeral head. A moderate hiatal hernia is suspected. IMPRESSION: 1. Lungs mildly hypoexpanded, with mild bilateral atelectasis. 2. Vascular congestion and cardiomegaly. 3. No displaced rib fracture seen. 4. Moderate hiatal hernia suspected. Electronically Signed   By: Garald Balding M.D.   On: 11/30/2015 23:58   Dg Pelvis 1-2 Views  Result Date: 12/01/2015 CLINICAL DATA:  Status post unwitnessed fall, with concern for pelvic injury. Intermittent left groin pain. Initial encounter. EXAM: PELVIS - 1-2 VIEW COMPARISON:  Pelvic radiograph performed 03/15/2015 FINDINGS: There is new cortical irregularity at the superior aspect of the left femoral neck, raising concern for a subcapital fracture of the left femoral neck. Would correlate for associated symptoms. Both femoral heads are seated normally within their respective acetabula. The patient's right femoral hardware is grossly unremarkable, though incompletely imaged. Mild degenerative change is noted at the pubic symphysis. The visualized bowel gas pattern is grossly unremarkable in appearance. IMPRESSION: New cortical irregularity at the superior aspect of the left femoral neck, raising concern for a subcapital fracture of the left femoral neck. Would correlate for associated symptoms. Electronically Signed   By: Garald Balding M.D.   On: 12/01/2015 00:01    Ct Head Wo Contrast  Result Date: 11/30/2015 CLINICAL DATA:  Status post fall, with concern for head injury. Initial encounter. EXAM: CT HEAD WITHOUT CONTRAST TECHNIQUE: Contiguous axial images were obtained from the base of the skull through the vertex without intravenous contrast. COMPARISON:  None. FINDINGS: Brain: No evidence of acute infarction, hemorrhage, hydrocephalus, extra-axial collection or mass lesion/mass effect. Prominence of the ventricles and sulci reflects mild to moderate cortical volume loss, with scattered periventricular and subcortical white matter change likely reflecting small vessel ischemic microangiopathy. A chronic infarct is noted at the left occipital lobe, with associated encephalomalacia. Cerebellar atrophy is noted. The brainstem and fourth ventricle are within normal limits. The basal ganglia are unremarkable in appearance. No mass effect or midline shift is seen. Vascular: No hyperdense vessel or unexpected calcification. Skull: There is no evidence of fracture; visualized osseous structures are unremarkable in appearance. Sinuses/Orbits: The orbits are within normal limits. The paranasal sinuses and mastoid air cells are well-aerated.  Other: Soft tissue swelling is noted overlying the right frontal calvarium. IMPRESSION: 1. No evidence of traumatic intracranial injury or fracture. 2. Soft tissue swelling overlying the right frontal calvarium. 3. Mild to moderate cortical volume loss and scattered small vessel ischemic microangiopathy. 4. Chronic infarct at the left occipital lobe, with associated encephalomalacia. Electronically Signed   By: Garald Balding M.D.   On: 11/30/2015 23:35   Ct Hip Left Wo Contrast  Result Date: 12/01/2015 CLINICAL DATA:  Acute onset of left hip pain, after unwitnessed fall. Initial encounter. EXAM: CT OF THE LEFT HIP WITHOUT CONTRAST TECHNIQUE: Multidetector CT imaging of the left hip was performed according to the standard protocol.  Multiplanar CT image reconstructions were also generated. COMPARISON:  Left hip radiographs performed 11/30/2015 FINDINGS: Bones/Joint/Cartilage There is a mildly displaced subcapital fracture through the left femoral neck, with posterior rotation of the distal femur. The femoral heads remain seated within their respective acetabula. Right femoral hardware is only partially imaged and appears grossly unremarkable, with surrounding heterotopic bone formation. No significant joint space narrowing is seen. Degenerative change is noted at the pubic symphysis. Minimal vacuum phenomenon is noted at the sacroiliac joints. Ligaments Suboptimally assessed by CT. Muscles and Tendons The visualized musculature is unremarkable in appearance. Prominent soft tissue scarring is noted about the right hip. Soft tissues Diffuse vascular calcifications are seen. A 3.2 cm right adnexal cystic lesion is noted. The bladder is moderately distended and grossly unremarkable. Scattered diverticulosis is noted along the sigmoid colon, without evidence of diverticulitis. IMPRESSION: 1. Mildly displaced subcapital fracture through the left femoral neck, with posterior rotation of the distal femur. 2. Diffuse vascular calcifications seen. 3. 3.2 cm right adnexal cystic lesion noted. Pelvic ultrasound could be considered for further evaluation, if deemed clinically appropriate. 4. Scattered diverticulosis along the sigmoid colon, without evidence of diverticulitis. Electronically Signed   By: Garald Balding M.D.   On: 12/01/2015 02:27    Positive ROS: All other systems have been reviewed and were otherwise negative with the exception of those mentioned in the HPI and as above.  Labs cbc  Recent Labs  12/01/15 0111 12/01/15 0506  WBC 10.0 12.4*  HGB 11.2* 11.9*  HCT 35.1* 36.6  PLT 242 248    Labs inflam No results for input(s): CRP in the last 72 hours.  Invalid input(s): ESR  Labs coag No results for input(s): INR, PTT in  the last 72 hours.  Invalid input(s): PT   Recent Labs  12/01/15 0029 12/01/15 0506  NA 141 141  K 4.4 4.2  CL 109 108  CO2  --  24  GLUCOSE 111* 175*  BUN 46* 40*  CREATININE 1.70* 1.64*  CALCIUM  --  9.1    Physical Exam: Vitals:   12/01/15 0240 12/01/15 0612  BP: (!) 136/123 (!) 127/92  Pulse: (!) 56 (!) 131  Resp:  18  Temp: 98 F (36.7 C) 98.2 F (36.8 C)   General: Alert, no acute distress Cardiovascular: No pedal edema Respiratory: No cyanosis, no use of accessory musculature GI: No organomegaly, abdomen is soft and non-tender Skin: No lesions in the area of chief complaint Neurologic: Sensation intact distally Psychiatric: Patient is competent for consent with normal mood and affect Lymphatic: No axillary or cervical lymphadenopathy  MUSCULOSKELETAL:  Minimal to no ttp over the left hip.  Positive log roll.  EHL/FHL intact.  2+ pt pulse.   Other extremities are atraumatic with painless ROM and NVI.  Assessment: Left hip femoral  neck fracture  Plan: Due to the patient being wheelchair bound and a high risk for surgery, we are going to treat this non-operatively.   Ok from ortho standpoint to resume blood thinner as we are not taking to surgery Weight Bearing Status: NWB as she is wheelchair bound VTE px: SCD's and chemical ppx per medicine Follow-up appointment with Dr. Edmonia Lynch in 2 weeks. Please call Dr. Edmonia Lynch if family is available for discussion Continue plan per [place], PA-C Cell [phone]   12/01/2015 8:42 AM

## 2015-12-01 NOTE — Progress Notes (Signed)
CSW met with pt's son today who is POA, pt has been at [facility] and son would like pt to return to this facility. CSW will coordinate pt going back to facility once pt is medically cleared.  Jaxie Racanelli B. Joline Maxcy Clinical Social Work Dept Weekend Social Worker [phone] 1:54 PM

## 2015-12-01 NOTE — ED Notes (Addendum)
Attempted report x1. With no answer

## 2015-12-01 NOTE — Progress Notes (Signed)
Interval note [facility]  Patient seen and examined this morning. Patient complaining of left hip pain. No other complaints. Patient was evaluated by orthopedics which recommended conservative management. Not surgical intervention at this point.  Vital signs stable Physical exam Left hip restricted range of movement due to pain.  A&P  Left hip femoral neck fracture. Treatment conservative. No weightbearing patient is wheelchair bound. Recommendations appreciated. Given patient was on anticoagulation will observe overnight and repeat CAT scan in the morning If everything negative. Anti-coagulation tomorrow discharge.  For full analysis and plan see H&P.  Chipper Oman, MD

## 2015-12-01 NOTE — ED Notes (Signed)
RN informed about wound to pt left arm that came in upon arrival. Mepalix

## 2015-12-02 ENCOUNTER — Inpatient Hospital Stay (HOSPITAL_COMMUNITY): Payer: Medicare Other

## 2015-12-02 DIAGNOSIS — I10 Essential (primary) hypertension: Secondary | ICD-10-CM

## 2015-12-02 DIAGNOSIS — F0390 Unspecified dementia without behavioral disturbance: Secondary | ICD-10-CM

## 2015-12-02 DIAGNOSIS — E039 Hypothyroidism, unspecified: Secondary | ICD-10-CM

## 2015-12-02 LAB — CBC
HCT: 35.9 % — ABNORMAL LOW (ref 36.0–46.0)
Hemoglobin: 11.6 g/dL — ABNORMAL LOW (ref 12.0–15.0)
MCH: 28.7 pg (ref 26.0–34.0)
MCHC: 32.3 g/dL (ref 30.0–36.0)
MCV: 88.9 fL (ref 78.0–100.0)
PLATELETS: 227 10*3/uL (ref 150–400)
RBC: 4.04 MIL/uL (ref 3.87–5.11)
RDW: 17.1 % — AB (ref 11.5–15.5)
WBC: 11.1 10*3/uL — AB (ref 4.0–10.5)

## 2015-12-02 LAB — CBC AND DIFFERENTIAL: WBC: 11.1 10*3/mL

## 2015-12-02 MED ORDER — HYDROMORPHONE HCL 2 MG/ML IJ SOLN
0.5000 mg | INTRAMUSCULAR | Status: AC | PRN
  Administered 2015-12-02: 0.5 mg via INTRAVENOUS
  Filled 2015-12-02: qty 1

## 2015-12-02 MED ORDER — OXYCODONE-ACETAMINOPHEN 5-325 MG PO TABS: 2.0000 | ORAL_TABLET | Freq: Three times a day (TID) | ORAL | 0 refills | Status: DC | PRN

## 2015-12-02 NOTE — Discharge Summary (Signed)
Physician Discharge Summary  Marie Robertson  [id number]  DOB: 1920/08/03  DOA: 11/30/2015 PCP: PROVIDER NOT IN SYSTEM  Admit date: 11/30/2015 Discharge date: 12/02/2015  Admitted From: SNF  Disposition:  SNF  Recommendations for Outpatient Follow-up:  1. Follow up with PCP in 1 week 2. Please obtain BMP/CBC in one week 3. Follow up with Dr Edmonia Lynch in 2 weeks  Home Health: None Equipment/Devices: None   Discharge Condition: Guarded  CODE STATUS: DNR  Diet recommendation: Heart Healthy    Brief/Interim Summary: Marie Robertson is a 82 y.o. female with dementia, atrial fibrillation, hypothyroidism, hyperlipidemia was brought to the ER after patient had a fall at the nursing home which was unwitnessed. Patient is severely demented and does not contribute to the history. ED evaluation with CT head which was unremarkable for acute process. CT of the left hip showed left hip fracture. Orthopedics was consulted and recommended conservative treatment given, patient is wheelchair bound. She is a high risk for surgery. Advised to no weight bearing and pain management as needed. Eliquis was held, and CT head was repeated 24 hrs post head trauma, no intracranial bleed was found. Patient will be discharge back to SNF.   Subjective: Patient seen and examined, continues to complaint of pain. Difficult to engage due to dementia. No acute events overnight.   Discharge Diagnoses:  Femoral neck fracture  Ortho recommended conservative treatment  Follow up with Dr Edmonia Lynch in 2 weeks No weight bearing on the left - patient is wheelchair bound  Percocet every 8 hr for pain   Afib - HR rate and rhythm controlled Resume Eliquis  Continue BB  Essential hypertension - has been fluctuating secondary to pain  Continue atenolol  Control pain  Monitor BP   Hyperlipidemia Resume pravastatin   Dementia  Continue seroquel  Monitor pain closely at [facility] as pain can cause delirium  worsening dementia symptoms.   Hypothyroidism  Continue synthroid   Discharge Instructions  Discharge Instructions    Call MD for:  difficulty breathing, headache or visual disturbances    Complete by:  As directed    Call MD for:  extreme fatigue    Complete by:  As directed    Call MD for:  hives    Complete by:  As directed    Call MD for:  persistant dizziness or light-headedness    Complete by:  As directed    Call MD for:  persistant nausea and vomiting    Complete by:  As directed    Call MD for:  redness, tenderness, or signs of infection (pain, swelling, redness, odor or green/yellow discharge around incision site)    Complete by:  As directed    Call MD for:  severe uncontrolled pain    Complete by:  As directed    Call MD for:  temperature >100.4    Complete by:  As directed    Diet - low sodium heart healthy    Complete by:  As directed    Discharge instructions    Complete by:  As directed    NBW in left side  Follow up with orthopedics in 2 weeks   Increase activity slowly    Complete by:  As directed        Medication List    STOP taking these medications   zolpidem 5 MG tablet Commonly known as:  AMBIEN     TAKE these medications   acetaminophen 500 MG tablet Commonly known as:  TYLENOL Take 2 tablets (1,000 mg total) by mouth every 6 (six) hours as needed for mild pain, moderate pain or headache.   atenolol 50 MG tablet Commonly known as:  TENORMIN Take 50 mg by mouth daily.   ELIQUIS 2.5 MG Tabs tablet Generic drug:  apixaban Take 2.5 mg by mouth 2 (two) times daily. 9am and 5pm   feeding supplement Liqd Take 1 Container by mouth 2 (two) times daily between meals. 10am and 2pm   levothyroxine 50 MCG tablet Commonly known as:  SYNTHROID, LEVOTHROID Take 50 mcg by mouth daily before breakfast.   LORazepam 0.5 MG tablet Commonly known as:  ATIVAN Take 0.5 mg by mouth 3 (three) times daily as needed (agitation).   ondansetron 4 MG  disintegrating tablet Commonly known as:  ZOFRAN-ODT Take 4 mg by mouth every 8 (eight) hours as needed for nausea or vomiting.   oxyCODONE-acetaminophen 5-325 MG tablet Commonly known as:  PERCOCET/ROXICET Take 2 tablets by mouth every 8 (eight) hours as needed for severe pain.   pravastatin 40 MG tablet Commonly known as:  PRAVACHOL Take 1 tablet (40 mg total) by mouth at bedtime.   QUEtiapine 50 MG tablet Commonly known as:  SEROQUEL Take 50 mg by mouth at bedtime.   QUEtiapine 25 MG tablet Commonly known as:  SEROQUEL Take 25 mg by mouth daily.   senna 8.6 MG tablet Commonly known as:  SENOKOT Take 1 tablet by mouth at bedtime as needed for constipation.   traMADol 50 MG tablet Commonly known as:  ULTRAM 1 tab po q6h prn pain What changed:  how much to take  how to take this  when to take this  reasons to take this  additional instructions   Vitamin D-3 1000 units Caps Take 3,000 Units by mouth daily.       No Known Allergies  Consultations:  Orthopedic surgery - Dr Edmonia Lynch    Procedures/Studies: Dg Chest 1 View  Result Date: 11/30/2015 CLINICAL DATA:  Status post unwitnessed fall, with concern for chest injury. Initial encounter. EXAM: CHEST 1 VIEW COMPARISON:  Chest radiograph performed 06/04/2015 FINDINGS: The lungs are mildly hypoexpanded. Vascular crowding and vascular congestion are seen. Mild bilateral atelectasis is seen. No pleural effusion or pneumothorax is identified. The cardiomediastinal silhouette is enlarged. No displaced rib fractures are seen. Degenerative change is noted about the right glenohumeral joint, with chronic superior subluxation of the right humeral head. A moderate hiatal hernia is suspected. IMPRESSION: 1. Lungs mildly hypoexpanded, with mild bilateral atelectasis. 2. Vascular congestion and cardiomegaly. 3. No displaced rib fracture seen. 4. Moderate hiatal hernia suspected. Electronically Signed   By: Garald Balding  M.D.   On: 11/30/2015 23:58   Dg Pelvis 1-2 Views  Result Date: 12/01/2015 CLINICAL DATA:  Status post unwitnessed fall, with concern for pelvic injury. Intermittent left groin pain. Initial encounter. EXAM: PELVIS - 1-2 VIEW COMPARISON:  Pelvic radiograph performed 03/15/2015 FINDINGS: There is new cortical irregularity at the superior aspect of the left femoral neck, raising concern for a subcapital fracture of the left femoral neck. Would correlate for associated symptoms. Both femoral heads are seated normally within their respective acetabula. The patient's right femoral hardware is grossly unremarkable, though incompletely imaged. Mild degenerative change is noted at the pubic symphysis. The visualized bowel gas pattern is grossly unremarkable in appearance. IMPRESSION: New cortical irregularity at the superior aspect of the left femoral neck, raising concern for a subcapital fracture of the left femoral neck. Would correlate  for associated symptoms. Electronically Signed   By: Garald Balding M.D.   On: 12/01/2015 00:01   Ct Head Wo Contrast  Result Date: 12/02/2015 CLINICAL DATA:  Dementia. EXAM: CT HEAD WITHOUT CONTRAST TECHNIQUE: Contiguous axial images were obtained from the base of the skull through the vertex without intravenous contrast. COMPARISON:  CT scan of November 30, 2015. FINDINGS: Brain: Mild diffuse cortical atrophy is noted. Mild chronic ischemic white matter disease is noted. Old left occipital infarction is noted. No mass effect or midline shift is noted. Ventricular size is within normal limits. There is no evidence of mass lesion, hemorrhage or acute infarction. Vascular: Atherosclerosis of carotid siphons is noted. Skull: Normal. Negative for fracture or focal lesion. Sinuses/Orbits: No acute finding. Other: Small right frontal scalp hematoma is noted. IMPRESSION: Small right frontal scalp hematoma. Mild diffuse cortical atrophy. Mild chronic ischemic white matter disease. No  acute intracranial abnormality seen. Electronically Signed   By: Marijo Conception, M.D.   On: 12/02/2015 14:57   Ct Head Wo Contrast  Result Date: 11/30/2015 CLINICAL DATA:  Status post fall, with concern for head injury. Initial encounter. EXAM: CT HEAD WITHOUT CONTRAST TECHNIQUE: Contiguous axial images were obtained from the base of the skull through the vertex without intravenous contrast. COMPARISON:  None. FINDINGS: Brain: No evidence of acute infarction, hemorrhage, hydrocephalus, extra-axial collection or mass lesion/mass effect. Prominence of the ventricles and sulci reflects mild to moderate cortical volume loss, with scattered periventricular and subcortical white matter change likely reflecting small vessel ischemic microangiopathy. A chronic infarct is noted at the left occipital lobe, with associated encephalomalacia. Cerebellar atrophy is noted. The brainstem and fourth ventricle are within normal limits. The basal ganglia are unremarkable in appearance. No mass effect or midline shift is seen. Vascular: No hyperdense vessel or unexpected calcification. Skull: There is no evidence of fracture; visualized osseous structures are unremarkable in appearance. Sinuses/Orbits: The orbits are within normal limits. The paranasal sinuses and mastoid air cells are well-aerated. Other: Soft tissue swelling is noted overlying the right frontal calvarium. IMPRESSION: 1. No evidence of traumatic intracranial injury or fracture. 2. Soft tissue swelling overlying the right frontal calvarium. 3. Mild to moderate cortical volume loss and scattered small vessel ischemic microangiopathy. 4. Chronic infarct at the left occipital lobe, with associated encephalomalacia. Electronically Signed   By: Garald Balding M.D.   On: 11/30/2015 23:35   Ct Hip Left Wo Contrast  Result Date: 12/01/2015 CLINICAL DATA:  Acute onset of left hip pain, after unwitnessed fall. Initial encounter. EXAM: CT OF THE LEFT HIP WITHOUT  CONTRAST TECHNIQUE: Multidetector CT imaging of the left hip was performed according to the standard protocol. Multiplanar CT image reconstructions were also generated. COMPARISON:  Left hip radiographs performed 11/30/2015 FINDINGS: Bones/Joint/Cartilage There is a mildly displaced subcapital fracture through the left femoral neck, with posterior rotation of the distal femur. The femoral heads remain seated within their respective acetabula. Right femoral hardware is only partially imaged and appears grossly unremarkable, with surrounding heterotopic bone formation. No significant joint space narrowing is seen. Degenerative change is noted at the pubic symphysis. Minimal vacuum phenomenon is noted at the sacroiliac joints. Ligaments Suboptimally assessed by CT. Muscles and Tendons The visualized musculature is unremarkable in appearance. Prominent soft tissue scarring is noted about the right hip. Soft tissues Diffuse vascular calcifications are seen. A 3.2 cm right adnexal cystic lesion is noted. The bladder is moderately distended and grossly unremarkable. Scattered diverticulosis is noted along the sigmoid colon,  without evidence of diverticulitis. IMPRESSION: 1. Mildly displaced subcapital fracture through the left femoral neck, with posterior rotation of the distal femur. 2. Diffuse vascular calcifications seen. 3. 3.2 cm right adnexal cystic lesion noted. Pelvic ultrasound could be considered for further evaluation, if deemed clinically appropriate. 4. Scattered diverticulosis along the sigmoid colon, without evidence of diverticulitis. Electronically Signed   By: Garald Balding M.D.   On: 12/01/2015 02:27     Discharge Exam: Vitals:   12/02/15 0903 12/02/15 1500  BP: (!) 150/98 (!) 137/104  Pulse: 96 (!) 110  Resp:  20  Temp:  98.9 F (37.2 C)   Vitals:   12/02/15 0747 12/02/15 0903 12/02/15 1108 12/02/15 1500  BP:  (!) 150/98  (!) 137/104  Pulse:  96  (!) 110  Resp:    20  Temp:    98.9 F  (37.2 C)  TempSrc:    Axillary  SpO2: 94%  93% 93%  Weight:      Height:        General: Pt is alert, awake, dementia, follow simple comands Cardiovascular: RRR, S1/S2 +, no rubs, no gallops Respiratory: CTA bilaterally, no wheezing, no rhonchi Abdominal: Soft, NT, ND, bowel sounds + Extremities: no edema, no cyanosis, difficulty moving left hip due to pain    The results of significant diagnostics from this hospitalization (including imaging, microbiology, ancillary and laboratory) are listed below for reference.     Microbiology: No results found for this or any previous visit (from the past 240 hour(s)).   Labs: BNP (last 3 results) No results for input(s): BNP in the last 8760 hours. Basic Metabolic Panel:  Recent Labs Lab 12/01/15 0029 12/01/15 0506  NA 141 141  K 4.4 4.2  CL 109 108  CO2  --  24  GLUCOSE 111* 175*  BUN 46* 40*  CREATININE 1.70* 1.64*  CALCIUM  --  9.1   Liver Function Tests:  Recent Labs Lab 12/01/15 0506  AST 24  ALT 16  ALKPHOS 75  BILITOT 0.6  PROT 6.9  ALBUMIN 3.6   No results for input(s): LIPASE, AMYLASE in the last 168 hours. No results for input(s): AMMONIA in the last 168 hours. CBC:  Recent Labs Lab 12/01/15 0029 12/01/15 0111 12/01/15 0506 12/02/15 0429  WBC  --  10.0 12.4* 11.1*  NEUTROABS  --  7.8* 11.0*  --   HGB 11.9* 11.2* 11.9* 11.6*  HCT 35.0* 35.1* 36.6 35.9*  MCV  --  90.2 89.3 88.9  PLT  --  242 248 227   Cardiac Enzymes: No results for input(s): CKTOTAL, CKMB, CKMBINDEX, TROPONINI in the last 168 hours. BNP: Invalid input(s): POCBNP CBG: No results for input(s): GLUCAP in the last 168 hours. D-Dimer No results for input(s): DDIMER in the last 72 hours. Hgb A1c No results for input(s): HGBA1C in the last 72 hours. Lipid Profile No results for input(s): CHOL, HDL, LDLCALC, TRIG, CHOLHDL, LDLDIRECT in the last 72 hours. Thyroid function studies No results for input(s): TSH, T4TOTAL, T3FREE,  THYROIDAB in the last 72 hours.  Invalid input(s): FREET3 Anemia work up No results for input(s): VITAMINB12, FOLATE, FERRITIN, TIBC, IRON, RETICCTPCT in the last 72 hours. Urinalysis    Component Value Date/Time   COLORURINE YELLOW 08/06/2015 0242   APPEARANCEUR CLOUDY (A) 08/06/2015 0242   LABSPEC 1.010 08/06/2015 0242   PHURINE 6.0 08/06/2015 0242   GLUCOSEU NEGATIVE 08/06/2015 0242   HGBUR NEGATIVE 08/06/2015 0242   BILIRUBINUR NEGATIVE 08/06/2015 0242  BILIRUBINUR negative 06/19/2015 1349   KETONESUR NEGATIVE 08/06/2015 0242   PROTEINUR NEGATIVE 08/06/2015 0242   UROBILINOGEN 0.2 06/19/2015 1349   NITRITE NEGATIVE 08/06/2015 0242   LEUKOCYTESUR TRACE (A) 08/06/2015 0242   Sepsis Labs Invalid input(s): PROCALCITONIN,  WBC,  LACTICIDVEN Microbiology No results found for this or any previous visit (from the past 240 hour(s)).   Time coordinating discharge: Over 30 minutes  SIGNED:  Chipper Oman, MD  [facility] 12/02/2015, 4:03 PM Pager   If 7PM-7AM, please contact night-coverage [email or web address] Password [email or web address]

## 2015-12-02 NOTE — Care Management Note (Signed)
82 yo F had a fall at the nursing home which was unwitnessed and sustained a L hip fx. Patient has dementia. Awaiting for orthopedic consult.   Received referral to assist with [facility]. D/C plan is to return to SNF [facility]). SW is following pt.

## 2015-12-02 NOTE — NC FL2 (Signed)
[place] LEVEL OF CARE SCREENING TOOL     IDENTIFICATION  Patient Name: Marie Robertson Birthdate: 12/07/1920 Sex: female Admission Date (Current Location): 11/30/2015  [facility] and [place] Number:  Herbalist and Address:  The . [facility], [place] [address], [place], [place] [address]      Provider Number: [id number]  Attending Physician Name and Address:  Doreatha Lew, MD  Relative Name and Phone Number:       Current Level of Care: Hospital Recommended Level of Care: [place] Prior Approval Number:    Date Approved/Denied: 03/22/15 PASRR Number: [id number]  Discharge Plan: SNF    Current Diagnoses: Patient Active Problem List   Diagnosis Date Noted  . Femoral neck fracture ([place]) 12/01/2015  . Malnutrition of moderate degree 12/01/2015  . Closed fracture of neck of left femur ([place])   . Mood disorder ([place]) 11/19/2015  . Dementia with behavioral disturbance 11/08/2015  . CKD (chronic kidney disease) stage 3, GFR 30-59 ml/min 09/17/2015  . Insomnia 08/18/2015  . Confusion 06/20/2015  . Recent urinary tract infection 06/20/2015  . Dysuria 04/10/2015  . Edema 04/06/2015  . Acute encephalopathy 03/29/2015  . Vitamin D deficiency 03/29/2015  . UTI (lower urinary tract infection) 03/21/2015  . Hip fracture ([place]) 03/20/2015  . Chronic atrial fibrillation ([place]) 02/20/2015  . Hyperlipidemia 02/20/2015  . Essential hypertension, benign 12/13/2012  . Cardiac arrhythmia 12/13/2012  . Urinary incontinence 12/13/2012  . Hypothyroidism 12/13/2012  . History of depression 12/11/2012    Orientation RESPIRATION BLADDER Height & Weight     Place  Normal Incontinent Weight: 115 lb (52.2 kg) Height:  5\' 5"  (165.1 cm)  BEHAVIORAL SYMPTOMS/MOOD NEUROLOGICAL BOWEL NUTRITION STATUS      Continent Diet (Heart healthy; thin liquids)  AMBULATORY STATUS COMMUNICATION OF NEEDS Skin   Extensive Assist Administrator, Civil Service)  Verbally Normal                       Personal Care Assistance Level of Assistance  Bathing, Feeding, Dressing Bathing Assistance: Maximum assistance Feeding assistance: Independent Dressing Assistance: Maximum assistance     Functional Limitations Info  Sight, Hearing, Speech Sight Info: Adequate Hearing Info: Adequate Speech Info: Adequate    SPECIAL CARE FACTORS FREQUENCY  PT (By licensed PT), OT (By licensed OT)     PT Frequency: 3x week OT Frequency: 3x week            Contractures Contractures Info: Not present    Additional Factors Info  Code Status, Allergies Code Status Info: DNR Allergies Info: No known allergies           Current Medications (12/02/2015):  This is the current hospital active medication list Current Facility-Administered Medications  Medication Dose Route Frequency Provider Last Rate Last Dose  . atenolol (TENORMIN) tablet 50 mg  50 mg Oral Daily Rise Patience, MD   50 mg at 12/02/15 0903  . feeding supplement (ENSURE ENLIVE) (ENSURE ENLIVE) liquid 237 mL  237 mL Oral BID BM Doreatha Lew, MD   237 mL at 12/02/15 0903  . levothyroxine (SYNTHROID, LEVOTHROID) tablet 50 mcg  50 mcg Oral QAC breakfast Rise Patience, MD   50 mcg at 12/02/15 0900  . LORazepam (ATIVAN) tablet 0.5 mg  0.5 mg Oral TID PRN Rise Patience, MD   0.5 mg at 12/02/15 1108  . ondansetron (ZOFRAN-ODT) disintegrating tablet 4 mg  4 mg Oral Q8H PRN Doreatha Lew  Hal Hope, MD      . oxyCODONE (Oxy IR/ROXICODONE) immediate release tablet 5 mg  5 mg Oral Q4H PRN Doreatha Lew, MD      . oxyCODONE-acetaminophen (PERCOCET/ROXICET) 5-325 MG per tablet 1-2 tablet  1-2 tablet Oral Q4H PRN Doreatha Lew, MD   1 tablet at 12/02/15 1108  . pravastatin (PRAVACHOL) tablet 40 mg  40 mg Oral QHS Rise Patience, MD   40 mg at 12/01/15 2247  . QUEtiapine (SEROQUEL) tablet 25 mg  25 mg Oral Daily Rise Patience, MD   25 mg at 12/02/15 0903  .  QUEtiapine (SEROQUEL) tablet 50 mg  50 mg Oral QHS Rise Patience, MD   50 mg at 12/01/15 2247  . senna (SENOKOT) tablet 8.6 mg  1 tablet Oral QHS PRN Rise Patience, MD      . zolpidem (AMBIEN) tablet 5 mg  5 mg Oral QHS PRN Rise Patience, MD         Discharge Medications: Please see discharge summary for a list of discharge medications.  Relevant Imaging Results:  Relevant Lab Results:   Additional Information SSN: [id number]  Alla German, LCSW

## 2015-12-02 NOTE — Progress Notes (Signed)
Report called to Lauren, LPN at [facility].  

## 2015-12-02 NOTE — Clinical Social Work Note (Signed)
Clinical Social Work Assessment  Patient Details  Name: Marie Robertson MRN: [id number] Date of Birth: April 19, 1920  Date of referral:  12/02/15               Reason for consult:  Facility Placement                Permission sought to share information with:  Family Supports Permission granted to share information::     Name::     Marie Robertson  Agency::     Relationship::  Son, [place]  Contact Information:  [phone]  Housing/Transportation Living arrangements for the past 2 months:  [place] of Information:  Adult Children Patient Interpreter Needed:  None Criminal Activity/Legal Involvement Pertinent to Current Situation/Hospitalization:  No - Comment as needed Significant Relationships:  Adult Children Lives with:  Facility Resident Do you feel safe going back to the place where you live?  Yes Need for family participation in patient care:  Yes (Comment)  Care giving concerns:  CSW spoke with pt's son and pt's POA via telephone. Pt's son has no concerns at this time.    Social Worker assessment / plan:  Pt is disoriented. CSW spoke with pt's son and pt's POA via telephone. Pt's son was expecting pt to d/c Monday however, is appreciative of possible d/c today. Pt is from [facility] and will return once medically cleared. Per pt's son pt will transport to facility by [facility]. CSW will facilitate transportation once medically cleared.   Employment status:  Retired Forensic scientist:  Medicare PT Recommendations:  [place] / Referral to community resources:  [facility]  Patient/Family's Response to care:  Pt's son verbalized understanding of CSW role and appreciation of support.  Patient/Family's Understanding of and Emotional Response to Diagnosis, Current Treatment, and Prognosis:  Pt's son understanding of pts physical limitations. Pt's son is agreeable to pt return to [facility]. Pt's son denies any concerns or  questions at this time.  Emotional Assessment Appearance:  Appears stated age Attitude/Demeanor/Rapport:  Unable to Assess Affect (typically observed):  Unable to Assess Orientation:  Oriented to Place Alcohol / Substance use:  Not Applicable Psych involvement (Current and /or in the community):  No (Comment)  Discharge Needs  Concerns to be addressed:  No discharge needs identified Readmission within the last 30 days:  No Current discharge risk:  Dependent with Mobility Barriers to Discharge:  Continued Medical Work up   [employer], LCSW 12/02/2015, 12:30 PM

## 2015-12-02 NOTE — Clinical Social Work Note (Addendum)
Per CSW conversation with charge RN pt is d/c today back to [facility]. CSW spoke with Rosendo Gros at [facility] and pt can return today. CSW spoke with MD and pt is receiving a CT Scan this Am. CSW spoke with pt's POA, pt's son [name] and he verbalized understanding of pt's possible d/c today. Pt will go by [facility].  If scan is cleared, MD please provide d/c summary, dc order, signed prescriptions, and signed DNR form when medically cleared.  [address], [place]

## 2015-12-02 NOTE — Clinical Social Work Placement (Signed)
   CLINICAL SOCIAL WORK PLACEMENT  NOTE  Date:  12/02/2015  Patient Details  Name: Marie Robertson MRN: [id number] Date of Birth: Apr 11, 1920  Clinical Social Work is seeking post-discharge placement for this patient at the [place] level of care (*CSW will initial, date and re-position this form in  chart as items are completed):  Yes   Patient/family provided with [place] Work Department's list of facilities offering this level of care within the geographic area requested by the patient (or if unable, by the patient's family).      Patient/family informed of their freedom to choose among providers that offer the needed level of care, that participate in Medicare, Medicaid or managed care program needed by the patient, have an available bed and are willing to accept the patient.      Patient/family informed of [facility]'s ownership interest in [facility] and [facility], as well as of the fact that they are under no obligation to receive care at these facilities.  PASRR submitted to EDS on       PASRR number received on       Existing PASRR number confirmed on 12/02/15     FL2 transmitted to all facilities in geographic area requested by pt/family on 12/02/15     FL2 transmitted to all facilities within larger geographic area on       Patient informed that his/her managed care company has contracts with or will negotiate with certain facilities, including the following:            Patient/family informed of bed offers received.  Patient chooses bed at [facility]     Physician recommends and patient chooses bed at      Patient to be transferred to [facility] on 12/02/15.  Patient to be transferred to facility by [facility]     Patient family notified on 12/02/15 of transfer.  Name of family member notified:  [name]     PHYSICIAN Please prepare priority discharge summary, including medications,  Please prepare prescriptions, Please sign DNR     Additional Comment:    _______________________________________________ Alla German, LCSW 12/02/2015, 12:23 PM

## 2015-12-02 NOTE — Clinical Social Work Note (Signed)
Clinical Social Worker facilitated patient discharge including contacting patient family and facility to confirm patient discharge plans.  Clinical information faxed to facility and family agreeable with plan.  CSW arranged ambulance transport via [facility] to [facility].  RN to call report prior to discharge [phone] .  Clinical Social Worker will sign off for now as social work intervention is no longer needed. Please consult [place] again if new need arises.  [address] [place]

## 2015-12-04 ENCOUNTER — Encounter: Payer: Self-pay | Admitting: Internal Medicine

## 2015-12-04 ENCOUNTER — Non-Acute Institutional Stay (SKILLED_NURSING_FACILITY): Payer: Medicare Other | Admitting: Internal Medicine

## 2015-12-04 DIAGNOSIS — G301 Alzheimer's disease with late onset: Secondary | ICD-10-CM | POA: Diagnosis not present

## 2015-12-04 DIAGNOSIS — I482 Chronic atrial fibrillation, unspecified: Secondary | ICD-10-CM

## 2015-12-04 DIAGNOSIS — S72012D Unspecified intracapsular fracture of left femur, subsequent encounter for closed fracture with routine healing: Secondary | ICD-10-CM | POA: Diagnosis not present

## 2015-12-04 DIAGNOSIS — E785 Hyperlipidemia, unspecified: Secondary | ICD-10-CM | POA: Diagnosis not present

## 2015-12-04 DIAGNOSIS — S72002D Fracture of unspecified part of neck of left femur, subsequent encounter for closed fracture with routine healing: Secondary | ICD-10-CM

## 2015-12-04 DIAGNOSIS — I10 Essential (primary) hypertension: Secondary | ICD-10-CM

## 2015-12-04 DIAGNOSIS — E559 Vitamin D deficiency, unspecified: Secondary | ICD-10-CM

## 2015-12-04 DIAGNOSIS — F0281 Dementia in other diseases classified elsewhere with behavioral disturbance: Secondary | ICD-10-CM

## 2015-12-04 DIAGNOSIS — R2681 Unsteadiness on feet: Secondary | ICD-10-CM | POA: Diagnosis not present

## 2015-12-04 DIAGNOSIS — F39 Unspecified mood [affective] disorder: Secondary | ICD-10-CM | POA: Diagnosis not present

## 2015-12-04 DIAGNOSIS — M6281 Muscle weakness (generalized): Secondary | ICD-10-CM | POA: Diagnosis not present

## 2015-12-04 DIAGNOSIS — F02818 Alzheimer's disease with late onset: Secondary | ICD-10-CM

## 2015-12-04 NOTE — Progress Notes (Signed)
: Provider:  Noah Delaine. [name], MD Location:  [place] Room Number: [phone] Place of Service:  SNF (31)  PCP: PROVIDER NOT IN SYSTEM Patient Care Team: Provider Not In System as PCP - General  Extended Emergency Contact Information Primary Emergency Contact: [facility] Address: [place], [place] [address] [place] Phone: [phone] Relation: Son Secondary Emergency Contact: [place], [place] [address] [address] [place] Phone: [phone] Mobile Phone: [phone] Relation: Son     Allergies: Patient has no known allergies.  Chief Complaint  Patient presents with  . New Admit To SNF    Admit to Facility    HPI: Patient is 82 y.o. female whodementia, atrial fibrillation, hypothyroidism, hyperlipidemia was brought to the ER after patient had a fall at the nursing home which was unwitnessed. ED evaluation with CT head which was unremarkable for acute process. CT of the left hip showed left hip fracture. Pt was admitted to [facility] from 11/23-25 where orthopedics was consulted and recommended conservative treatment given, patient is wheelchair bound. She is a high risk for surgery. Advised to no weight bearing and pain management as needed. Eliquis was held, and CT head was repeated 24 hrs post head trauma, no intracranial bleed was found. She was described as severely demented and I think that played into the decision not to operate, but that has not been my experience with her. Pt is admitted back to SNF for residential care.While at SNF pt will be followed for AF, tx with eliquis,, HTN, tx with atenolol and Vit D def, tx with replacement.  Past Medical History:  Diagnosis Date  . Chronic atrial fibrillation ([place])   . Chronic renal insufficiency, stage III (moderate)   . Constipation   . CVA (cerebral infarction) approx 2012   residual defect: poor R sided peripheral vision per son  .  Depression   . History of fracture of right hip 03/2015  . Hyperlipidemia   . Hypertension   . Hypothyroidism   . Insomnia   . Osteoarthritis    wrists, hands  . Osteoporosis     Past Surgical History:  Procedure Laterality Date  . APPENDECTOMY    . COLONOSCOPY  08/22/08  . INTRAMEDULLARY (IM) NAIL INTERTROCHANTERIC Right 03/22/2015   Procedure: RIGHT femoral HIP NAILING;  Surgeon: Gaynelle Arabian, MD;  Location: [facility];  Service: Orthopedics;  Laterality: Right;  . KNEE ARTHROSCOPY Right   . THYROID SURGERY        Medication List       Accurate as of 12/04/15 11:30 AM. Always use your most recent med list.          acetaminophen 500 MG tablet Commonly known as:  TYLENOL Take 2 tablets (1,000 mg total) by mouth every 6 (six) hours as needed for mild pain, moderate pain or headache.   atenolol 50 MG tablet Commonly known as:  TENORMIN Take 50 mg by mouth daily.   ELIQUIS 2.5 MG Tabs tablet Generic drug:  apixaban Take 2.5 mg by mouth 2 (two) times daily. 9am and 5pm   feeding supplement Liqd Take 1 Container by mouth 2 (two) times daily between meals. 10am and 2pm   levothyroxine 50 MCG tablet Commonly known as:  SYNTHROID, LEVOTHROID Take 50 mcg by mouth daily before breakfast.   LORazepam 0.5 MG tablet Commonly known as:  ATIVAN Take 0.5 mg by mouth 3 (three) times daily as needed (agitation).   ondansetron 4 MG disintegrating tablet Commonly known as:  ZOFRAN-ODT Take 4 mg by mouth every 8 (eight) hours as needed for nausea or vomiting.   oxyCODONE-acetaminophen 5-325 MG tablet Commonly known as:  PERCOCET/ROXICET Take 2 tablets by mouth every 8 (eight) hours as needed for severe pain.   pravastatin 40 MG tablet Commonly known as:  PRAVACHOL Take 1 tablet (40 mg total) by mouth at bedtime.   QUEtiapine 50 MG tablet Commonly known as:  SEROQUEL Take 50 mg by mouth at bedtime.   QUEtiapine 25 MG tablet Commonly known as:  SEROQUEL Take 25 mg by mouth  daily.   senna 8.6 MG tablet Commonly known as:  SENOKOT Take 1 tablet by mouth at bedtime as needed for constipation.   traMADol 50 MG tablet Commonly known as:  ULTRAM 1 tab po q6h prn pain   Vitamin D-3 1000 units Caps Take 3,000 Units by mouth daily.       No orders of the defined types were placed in this encounter.   Immunization History  Administered Date(s) Administered  . Influenza Split 11/20/2012  . Influenza-Unspecified 10/07/2015  . PPD Test 08/14/2015  . Pneumococcal Conjugate-13 05/22/2015  . Pneumococcal Polysaccharide-23 08/22/2008  . Tdap 01/01/2015, 08/06/2015    Social History  Substance Use Topics  . Smoking status: Never Smoker  . Smokeless tobacco: Never Used  . Alcohol use No    Family history is   Family History  Problem Relation Age of Onset  . Stroke Mother   . Diabetes Mother   . Alcohol abuse Father       Review of Systems  UTO 2/2 confusion; per nursing pt is in terrible pain; she was sent from hospital with only tylenol for pain; she has noted visible muscle spasms    Vitals:   12/04/15 1111  BP: 128/71  Pulse: 87  Resp: 18  Temp: 98 F (36.7 C)    SpO2 Readings from Last 1 Encounters:  12/02/15 93%   Body mass index is 19.5 kg/m.     Physical Exam  GENERAL APPEARANCE: sleeping, facial grimancing with the slightest movement    SKIN: No diaphoresis rash HEAD: Normocephalic, atraumatic  EYES: Conjunctiva/lids clear. Pupils round, reactive. EOMs intact.  EARS: External exam WNL, canals clear. Hearing grossly normal.  NOSE: No deformity or discharge.  MOUTH/THROAT: Lips w/o lesions  RESPIRATORY: Breathing is even, unlabored. Lung sounds are clear   CARDIOVASCULAR: Heart irreg no murmurs, rubs or gallops. No peripheral edema.   GASTROINTESTINAL: Abdomen is soft, non-tender, not distended w/ normal bowel sounds. GENITOURINARY: Bladder non tender, not distended  MUSCULOSKELETAL: abnormality l hip NEUROLOGIC:   Cranial nerves 2-12 grossly intact. Moves all extremities  PSYCHIATRIC:  no behavioral issues  Patient Active Problem List   Diagnosis Date Noted  . Femoral neck fracture ([place]) 12/01/2015  . Malnutrition of moderate degree 12/01/2015  . Closed fracture of neck of left femur ([place])   . Mood disorder ([place]) 11/19/2015  . Dementia with behavioral disturbance 11/08/2015  . CKD (chronic kidney disease) stage 3, GFR 30-59 ml/min 09/17/2015  . Insomnia 08/18/2015  . Confusion 06/20/2015  . Recent urinary tract infection 06/20/2015  . Dysuria 04/10/2015  . Edema 04/06/2015  . Acute encephalopathy 03/29/2015  . Vitamin D deficiency 03/29/2015  . UTI (lower urinary tract infection) 03/21/2015  . Hip fracture ([place]) 03/20/2015  . Chronic atrial fibrillation ([place]) 02/20/2015  .  Hyperlipidemia 02/20/2015  . Essential hypertension, benign 12/13/2012  . Cardiac arrhythmia 12/13/2012  . Urinary incontinence 12/13/2012  . Hypothyroidism 12/13/2012  . History of depression 12/11/2012      Labs reviewed: Basic Metabolic Panel:    Component Value Date/Time   NA 141 12/01/2015 0506   NA 141 12/01/2015 0506   K 4.2 12/01/2015 0506   CL 108 12/01/2015 0506   CO2 24 12/01/2015 0506   GLUCOSE 175 (H) 12/01/2015 0506   BUN 40 (H) 12/01/2015 0506   BUN 40 (A) 12/01/2015 0506   CREATININE 1.64 (H) 12/01/2015 0506   CREATININE 1.6 (A) 12/01/2015 0506   CALCIUM 9.1 12/01/2015 0506   PROT 6.9 12/01/2015 0506   ALBUMIN 3.6 12/01/2015 0506   AST 24 12/01/2015 0506   ALT 16 12/01/2015 0506   ALKPHOS 75 12/01/2015 0506   BILITOT 0.6 12/01/2015 0506   GFRNONAA 26 (L) 12/01/2015 0506   GFRAA 30 (L) 12/01/2015 0506     Recent Labs  06/04/15 1952 08/06/15 0220 12/01/15 0029 12/01/15 0506  NA 138 133* 141  141 141  141  K 4.3 3.6 4.4  4.4 4.2  CL 104 97* 109 108  CO2 26 26  --  24  GLUCOSE 93 125* 111* 175*  BUN 29* 25* 46*  46* 40*  40*  CREATININE 1.25* 1.44* 1.70*  1.7* 1.64*   1.6*  CALCIUM 8.3* 8.7*  --  9.1   Liver Function Tests:  Recent Labs  02/20/15 1512 03/20/15 1700 12/01/15 0506  AST 18 27 24   ALT 10 15 16   ALKPHOS 67 51 75  BILITOT 0.4 0.8 0.6  PROT 6.7 6.4* 6.9  ALBUMIN 3.8 3.5 3.6   No results for input(s): LIPASE, AMYLASE in the last 8760 hours. No results for input(s): AMMONIA in the last 8760 hours. CBC:  Recent Labs  08/06/15 0220  12/01/15 0111 12/01/15 0506 12/02/15 12/02/15 0429  WBC 9.0  --  10.0  10.0 12.4  12.4* 11.1 11.1*  NEUTROABS 6.7  --  7.8* 11.0*  --   --   HGB 10.7*  < > 11.2*  11.2* 11.9*  11.9*  --  11.6*  HCT 33.2*  < > 35.1*  35* 36.6  37  --  35.9*  MCV 88.5  --  90.2 89.3  --  88.9  PLT 282  --  242  242 248  248  --  227  < > = values in this interval not displayed. Lipid  Recent Labs  08/24/15  CHOL 107  HDL 55  LDLCALC 37  TRIG 77    Cardiac Enzymes:  Recent Labs  03/15/15 1505 03/20/15 1700  CKTOTAL 131 151  TROPONINI 0.03 <0.03   BNP: No results for input(s): BNP in the last 8760 hours. No results found for: [facility] Lab Results  Component Value Date   HGBA1C 5.3 08/24/2015   Lab Results  Component Value Date   TSH 0.31 (A) 10/10/2015   Lab Results  Component Value Date   VITAMINB12 427 03/21/2015   Lab Results  Component Value Date   FOLATE 13.9 03/21/2015   Lab Results  Component Value Date   IRON 15 (L) 03/21/2015   TIBC 319 03/21/2015   FERRITIN 31 03/21/2015    Imaging and Procedures obtained prior to SNF admission: Dg Chest 1 View  Result Date: 11/30/2015 CLINICAL DATA:  Status post unwitnessed fall, with concern for chest injury. Initial encounter. EXAM: CHEST 1 VIEW COMPARISON:  Chest  radiograph performed 06/04/2015 FINDINGS: The lungs are mildly hypoexpanded. Vascular crowding and vascular congestion are seen. Mild bilateral atelectasis is seen. No pleural effusion or pneumothorax is identified. The cardiomediastinal silhouette is enlarged. No  displaced rib fractures are seen. Degenerative change is noted about the right glenohumeral joint, with chronic superior subluxation of the right humeral head. A moderate hiatal hernia is suspected. IMPRESSION: 1. Lungs mildly hypoexpanded, with mild bilateral atelectasis. 2. Vascular congestion and cardiomegaly. 3. No displaced rib fracture seen. 4. Moderate hiatal hernia suspected. Electronically Signed   By: Garald Balding M.D.   On: 11/30/2015 23:58   Dg Pelvis 1-2 Views  Result Date: 12/01/2015 CLINICAL DATA:  Status post unwitnessed fall, with concern for pelvic injury. Intermittent left groin pain. Initial encounter. EXAM: PELVIS - 1-2 VIEW COMPARISON:  Pelvic radiograph performed 03/15/2015 FINDINGS: There is new cortical irregularity at the superior aspect of the left femoral neck, raising concern for a subcapital fracture of the left femoral neck. Would correlate for associated symptoms. Both femoral heads are seated normally within their respective acetabula. The patient's right femoral hardware is grossly unremarkable, though incompletely imaged. Mild degenerative change is noted at the pubic symphysis. The visualized bowel gas pattern is grossly unremarkable in appearance. IMPRESSION: New cortical irregularity at the superior aspect of the left femoral neck, raising concern for a subcapital fracture of the left femoral neck. Would correlate for associated symptoms. Electronically Signed   By: Garald Balding M.D.   On: 12/01/2015 00:01   Ct Head Wo Contrast  Result Date: 11/30/2015 CLINICAL DATA:  Status post fall, with concern for head injury. Initial encounter. EXAM: CT HEAD WITHOUT CONTRAST TECHNIQUE: Contiguous axial images were obtained from the base of the skull through the vertex without intravenous contrast. COMPARISON:  None. FINDINGS: Brain: No evidence of acute infarction, hemorrhage, hydrocephalus, extra-axial collection or mass lesion/mass effect. Prominence of the ventricles and  sulci reflects mild to moderate cortical volume loss, with scattered periventricular and subcortical white matter change likely reflecting small vessel ischemic microangiopathy. A chronic infarct is noted at the left occipital lobe, with associated encephalomalacia. Cerebellar atrophy is noted. The brainstem and fourth ventricle are within normal limits. The basal ganglia are unremarkable in appearance. No mass effect or midline shift is seen. Vascular: No hyperdense vessel or unexpected calcification. Skull: There is no evidence of fracture; visualized osseous structures are unremarkable in appearance. Sinuses/Orbits: The orbits are within normal limits. The paranasal sinuses and mastoid air cells are well-aerated. Other: Soft tissue swelling is noted overlying the right frontal calvarium. IMPRESSION: 1. No evidence of traumatic intracranial injury or fracture. 2. Soft tissue swelling overlying the right frontal calvarium. 3. Mild to moderate cortical volume loss and scattered small vessel ischemic microangiopathy. 4. Chronic infarct at the left occipital lobe, with associated encephalomalacia. Electronically Signed   By: Garald Balding M.D.   On: 11/30/2015 23:35   Ct Hip Left Wo Contrast  Result Date: 12/01/2015 CLINICAL DATA:  Acute onset of left hip pain, after unwitnessed fall. Initial encounter. EXAM: CT OF THE LEFT HIP WITHOUT CONTRAST TECHNIQUE: Multidetector CT imaging of the left hip was performed according to the standard protocol. Multiplanar CT image reconstructions were also generated. COMPARISON:  Left hip radiographs performed 11/30/2015 FINDINGS: Bones/Joint/Cartilage There is a mildly displaced subcapital fracture through the left femoral neck, with posterior rotation of the distal femur. The femoral heads remain seated within their respective acetabula. Right femoral hardware is only partially imaged and appears grossly unremarkable, with surrounding heterotopic  bone formation. No  significant joint space narrowing is seen. Degenerative change is noted at the pubic symphysis. Minimal vacuum phenomenon is noted at the sacroiliac joints. Ligaments Suboptimally assessed by CT. Muscles and Tendons The visualized musculature is unremarkable in appearance. Prominent soft tissue scarring is noted about the right hip. Soft tissues Diffuse vascular calcifications are seen. A 3.2 cm right adnexal cystic lesion is noted. The bladder is moderately distended and grossly unremarkable. Scattered diverticulosis is noted along the sigmoid colon, without evidence of diverticulitis. IMPRESSION: 1. Mildly displaced subcapital fracture through the left femoral neck, with posterior rotation of the distal femur. 2. Diffuse vascular calcifications seen. 3. 3.2 cm right adnexal cystic lesion noted. Pelvic ultrasound could be considered for further evaluation, if deemed clinically appropriate. 4. Scattered diverticulosis along the sigmoid colon, without evidence of diverticulitis. Electronically Signed   By: Garald Balding M.D.   On: 12/01/2015 02:27     Not all labs, radiology exams or other studies done during hospitalization come through on my EPIC note; however they are reviewed by me.    Assessment and Plan;   L FEMORAL NECK FRACTURE - was not taken to surgery which shocked me; primary healing and rehab SNF - pt is in a great deal of pain have started Norco 5 mg q6 scheduled and robaxin 1000 mg q6; will back of in a week or so  HTN SNF - controlled; cont atenolol 50 mg daily  CAF  SNF - cont elequis 2.5 mg BID and atenolol 50 mg daily for rate  DEMENTIA  SNF - would not catogorize it as severe; pt was in hall in [facility] socializing daily; pt is on no specific meds  MOOD DISORDER SNF  - cont 25 mg  q am and 50 mg q HS  VITAMIN D DEF SNF - cont replacement 3000 u daily  HLD SNF - controlled;cont pravachol 40 mg daily    Time spent > 45 min;> 50% of time with patient was spent reviewing  records, labs, tests and studies, counseling and developing plan of care  Webb Silversmith D. [name], MD

## 2015-12-06 ENCOUNTER — Non-Acute Institutional Stay (SKILLED_NURSING_FACILITY): Payer: Medicare Other | Admitting: Internal Medicine

## 2015-12-06 DIAGNOSIS — E034 Atrophy of thyroid (acquired): Secondary | ICD-10-CM

## 2015-12-06 DIAGNOSIS — S72009A Fracture of unspecified part of neck of unspecified femur, initial encounter for closed fracture: Secondary | ICD-10-CM | POA: Diagnosis not present

## 2015-12-06 DIAGNOSIS — S72002D Fracture of unspecified part of neck of left femur, subsequent encounter for closed fracture with routine healing: Secondary | ICD-10-CM | POA: Diagnosis not present

## 2015-12-06 DIAGNOSIS — R531 Weakness: Secondary | ICD-10-CM | POA: Diagnosis not present

## 2015-12-07 ENCOUNTER — Encounter: Payer: Self-pay | Admitting: Internal Medicine

## 2015-12-07 DIAGNOSIS — F419 Anxiety disorder, unspecified: Secondary | ICD-10-CM | POA: Diagnosis not present

## 2015-12-07 DIAGNOSIS — G4700 Insomnia, unspecified: Secondary | ICD-10-CM | POA: Diagnosis not present

## 2015-12-07 DIAGNOSIS — S72012D Unspecified intracapsular fracture of left femur, subsequent encounter for closed fracture with routine healing: Secondary | ICD-10-CM | POA: Diagnosis not present

## 2015-12-07 DIAGNOSIS — M6281 Muscle weakness (generalized): Secondary | ICD-10-CM | POA: Diagnosis not present

## 2015-12-07 DIAGNOSIS — R2681 Unsteadiness on feet: Secondary | ICD-10-CM | POA: Diagnosis not present

## 2015-12-07 DIAGNOSIS — F29 Unspecified psychosis not due to a substance or known physiological condition: Secondary | ICD-10-CM | POA: Diagnosis not present

## 2015-12-07 NOTE — Progress Notes (Addendum)
Location:  [facility] Room Number: [phone] Place of Service:  [id number])  Marie Robertson. Perrin Eddleman,MD  Patient Care Team: Provider Not In System as PCP - General  Extended Emergency Contact Information Primary Emergency Contact: [facility] Address: [place], [place] [address] [place] Phone: [phone] Relation: Son Secondary Emergency Contact: [place], [place] [address] [place]nette Litter of [place] Phone: [phone] Mobile Phone: [phone] Relation: Son    Allergies: Patient has no known allergies.  Chief Complaint  Patient presents with  . Acute Visit    Acute    HPI: Patient is 82 y.o. female who is being seen for an abnormal lab, TSH of 0.20 on synthroid 50 mcg daily. Pt has had no symptoms referable to hyperthyroidism. She has been recently consumed with the pain of her hip fracure that is not to be repaired.  Past Medical History:  Diagnosis Date  . Chronic atrial fibrillation ([place])   . Chronic renal insufficiency, stage III (moderate)   . Constipation   . CVA (cerebral infarction) approx 2012   residual defect: poor R sided peripheral vision per son  . Depression   . History of fracture of right hip 03/2015  . Hyperlipidemia   . Hypertension   . Hypothyroidism   . Insomnia   . Osteoarthritis    wrists, hands  . Osteoporosis     Past Surgical History:  Procedure Laterality Date  . APPENDECTOMY    . COLONOSCOPY  08/22/08  . INTRAMEDULLARY (IM) NAIL INTERTROCHANTERIC Right 03/22/2015   Procedure: RIGHT femoral HIP NAILING;  Surgeon: Gaynelle Arabian, MD;  Location: [facility] ORS;  Service: Orthopedics;  Laterality: Right;  . KNEE ARTHROSCOPY Right   . THYROID SURGERY        Medication List       Accurate as of 12/06/15 11:59 PM. Always use your most recent med list.          acetaminophen 500 MG tablet Commonly known as:  TYLENOL Take 2 tablets (1,000 mg total) by mouth  every 6 (six) hours as needed for mild pain, moderate pain or headache.   atenolol 50 MG tablet Commonly known as:  TENORMIN Take 50 mg by mouth daily.   ELIQUIS 2.5 MG Tabs tablet Generic drug:  apixaban Take 2.5 mg by mouth 2 (two) times daily. 9am and 5pm   feeding supplement Liqd Take 1 Container by mouth 2 (two) times daily between meals. 10am and 2pm   HYDROcodone-acetaminophen 5-325 MG tablet Commonly known as:  NORCO/VICODIN Take 1 tablet by mouth every 6 (six) hours. scheduled   LORazepam 0.5 MG tablet Commonly known as:  ATIVAN Take 0.5 mg by mouth 3 (three) times daily as needed (agitation).   methocarbamol 500 MG tablet Commonly known as:  ROBAXIN Take 1,000 mg by mouth every 6 (six) hours. Hold for sedation   ondansetron 4 MG disintegrating tablet Commonly known as:  ZOFRAN-ODT Take 4 mg by mouth every 8 (eight) hours as needed for nausea or vomiting.   oxyCODONE-acetaminophen 5-325 MG tablet Commonly known as:  PERCOCET/ROXICET Take 2 tablets by mouth every 8 (eight) hours as needed for severe pain.   pravastatin 40 MG tablet Commonly known as:  PRAVACHOL Take 1 tablet (40 mg total) by mouth at bedtime.   QUEtiapine 50 MG tablet Commonly known as:  SEROQUEL Take 50 mg by  mouth at bedtime.   QUEtiapine 25 MG tablet Commonly known as:  SEROQUEL Take 25 mg by mouth daily.   senna 8.6 MG tablet Commonly known as:  SENOKOT Take 1 tablet by mouth at bedtime as needed for constipation.   traMADol 50 MG tablet Commonly known as:  ULTRAM 1 tab po q6h prn pain   Vitamin D-3 1000 units Caps Take 3,000 Units by mouth daily.       Meds ordered this encounter  Medications  . HYDROcodone-acetaminophen (NORCO/VICODIN) 5-325 MG tablet    Sig: Take 1 tablet by mouth every 6 (six) hours. scheduled  . methocarbamol (ROBAXIN) 500 MG tablet    Sig: Take 1,000 mg by mouth every 6 (six) hours. Hold for sedation    Immunization History  Administered Date(s)  Administered  . Influenza Split 11/20/2012  . Influenza-Unspecified 10/07/2015  . PPD Test 08/14/2015  . Pneumococcal Conjugate-13 05/22/2015  . Pneumococcal Polysaccharide-23 08/22/2008  . Tdap 01/01/2015, 08/06/2015    Social History  Substance Use Topics  . Smoking status: Never Smoker  . Smokeless tobacco: Never Used  . Alcohol use No    Review of    UTO 2/2 dementia    Vitals:   12/06/15 0853  BP: 128/71  Pulse: 87  Resp: 18  Temp: 98 F (36.7 C)   Body mass index is 19.5 kg/m. Physical Exam  GENERAL APPEARANCE: Alert, min conversant, appears very uncomfortable  SKIN: No diaphoresis rash HEENT: Unremarkable RESPIRATORY: Breathing is even, unlabored. Lung sounds are clear   CARDIOVASCULAR: Heart RRR no murmurs, rubs or gallops. No peripheral edema  GASTROINTESTINAL: Abdomen is soft, non-tender, not distended w/ normal bowel sounds.  GENITOURINARY: Bladder non tender, not distended  MUSCULOSKELETAL: fracture R hip NEUROLOGIC: Cranial nerves 2-12 grossly intact. Moves all extremities PSYCHIATRIC: dementia, no behavioral issues  Patient Active Problem List   Diagnosis Date Noted  . Femoral neck fracture ([place]) 12/01/2015  . Malnutrition of moderate degree 12/01/2015  . Closed fracture of neck of left femur ([place])   . Mood disorder ([place]) 11/19/2015  . Dementia with behavioral disturbance 11/08/2015  . CKD (chronic kidney disease) stage 3, GFR 30-59 ml/min 09/17/2015  . Insomnia 08/18/2015  . Confusion 06/20/2015  . Recent urinary tract infection 06/20/2015  . Dysuria 04/10/2015  . Edema 04/06/2015  . Acute encephalopathy 03/29/2015  . Vitamin D deficiency 03/29/2015  . UTI (lower urinary tract infection) 03/21/2015  . Hip fracture ([place]) 03/20/2015  . Chronic atrial fibrillation ([place]) 02/20/2015  . Hyperlipidemia 02/20/2015  . Essential hypertension, benign 12/13/2012  . Cardiac arrhythmia 12/13/2012  . Urinary incontinence 12/13/2012  . Hypothyroidism  12/13/2012  . History of depression 12/11/2012    CMP     Component Value Date/Time   NA 141 12/01/2015 0506   NA 141 12/01/2015 0506   K 4.2 12/01/2015 0506   CL 108 12/01/2015 0506   CO2 24 12/01/2015 0506   GLUCOSE 175 (H) 12/01/2015 0506   BUN 40 (H) 12/01/2015 0506   BUN 40 (A) 12/01/2015 0506   CREATININE 1.64 (H) 12/01/2015 0506   CREATININE 1.6 (A) 12/01/2015 0506   CALCIUM 9.1 12/01/2015 0506   PROT 6.9 12/01/2015 0506   ALBUMIN 3.6 12/01/2015 0506   AST 24 12/01/2015 0506   ALT 16 12/01/2015 0506   ALKPHOS 75 12/01/2015 0506   BILITOT 0.6 12/01/2015 0506   GFRNONAA 26 (L) 12/01/2015 0506   GFRAA 30 (L) 12/01/2015 0506    Recent Labs  06/04/15 1952 08/06/15  [id number] 08/23/15 12/01/15 0029 12/01/15 0506  NA 138 133* 141 141  141 141  141  K 4.3 3.6 3.7 4.4  4.4 4.2  CL 104 97*  --  109 108  CO2 26 26  --   --  24  GLUCOSE 93 125*  --  111* 175*  BUN 29* 25* 25* 46*  46* 40*  40*  CREATININE 1.25* 1.44* 1.4* 1.70*  1.7* 1.64*  1.6*  CALCIUM 8.3* 8.7*  --   --  9.1    Recent Labs  02/20/15 1512 03/20/15 1700 12/01/15 0506  AST 18 27 24   ALT 10 15 16   ALKPHOS 67 51 75  BILITOT 0.4 0.8 0.6  PROT 6.7 6.4* 6.9  ALBUMIN 3.8 3.5 3.6    Recent Labs  08/06/15 0220  12/01/15 0111 12/01/15 0506 12/02/15 12/02/15 0429  WBC 9.0  < > 10.0  10.0 12.4  12.4* 11.1 11.1*  NEUTROABS 6.7  --  7.8* 11.0*  --   --   HGB 10.7*  < > 11.2*  11.2* 11.9*  11.9*  --  11.6*  HCT 33.2*  < > 35.1*  35* 36.6  37  --  35.9*  MCV 88.5  --  90.2 89.3  --  88.9  PLT 282  < > 242  242 248  248  --  227  < > = values in this interval not displayed.  Recent Labs  08/24/15  CHOL 107  LDLCALC 37  TRIG 77   No results found for: [facility] Lab Results  Component Value Date   TSH 0.20 (A) 11/30/2015   Lab Results  Component Value Date   HGBA1C 5.3 08/24/2015   Lab Results  Component Value Date   CHOL 107 08/24/2015   HDL 55 08/24/2015   LDLCALC 37  08/24/2015   TRIG 77 08/24/2015    Significant Diagnostic Results in last 30 days:  Dg Chest 1 View  Result Date: 11/30/2015 CLINICAL DATA:  Status post unwitnessed fall, with concern for chest injury. Initial encounter. EXAM: CHEST 1 VIEW COMPARISON:  Chest radiograph performed 06/04/2015 FINDINGS: The lungs are mildly hypoexpanded. Vascular crowding and vascular congestion are seen. Mild bilateral atelectasis is seen. No pleural effusion or pneumothorax is identified. The cardiomediastinal silhouette is enlarged. No displaced rib fractures are seen. Degenerative change is noted about the right glenohumeral joint, with chronic superior subluxation of the right humeral head. A moderate hiatal hernia is suspected. IMPRESSION: 1. Lungs mildly hypoexpanded, with mild bilateral atelectasis. 2. Vascular congestion and cardiomegaly. 3. No displaced rib fracture seen. 4. Moderate hiatal hernia suspected. Electronically Signed   By: Garald Balding M.D.   On: 11/30/2015 23:58   Dg Pelvis 1-2 Views  Result Date: 12/01/2015 CLINICAL DATA:  Status post unwitnessed fall, with concern for pelvic injury. Intermittent left groin pain. Initial encounter. EXAM: PELVIS - 1-2 VIEW COMPARISON:  Pelvic radiograph performed 03/15/2015 FINDINGS: There is new cortical irregularity at the superior aspect of the left femoral neck, raising concern for a subcapital fracture of the left femoral neck. Would correlate for associated symptoms. Both femoral heads are seated normally within their respective acetabula. The patient's right femoral hardware is grossly unremarkable, though incompletely imaged. Mild degenerative change is noted at the pubic symphysis. The visualized bowel gas pattern is grossly unremarkable in appearance. IMPRESSION: New cortical irregularity at the superior aspect of the left femoral neck, raising concern for a subcapital fracture of the left femoral neck. Would correlate for associated symptoms.  Electronically Signed   By: Garald Balding M.D.   On: 12/01/2015 00:01   Ct Head Wo Contrast  Result Date: 12/02/2015 CLINICAL DATA:  Dementia. EXAM: CT HEAD WITHOUT CONTRAST TECHNIQUE: Contiguous axial images were obtained from the base of the skull through the vertex without intravenous contrast. COMPARISON:  CT scan of November 30, 2015. FINDINGS: Brain: Mild diffuse cortical atrophy is noted. Mild chronic ischemic white matter disease is noted. Old left occipital infarction is noted. No mass effect or midline shift is noted. Ventricular size is within normal limits. There is no evidence of mass lesion, hemorrhage or acute infarction. Vascular: Atherosclerosis of carotid siphons is noted. Skull: Normal. Negative for fracture or focal lesion. Sinuses/Orbits: No acute finding. Other: Small right frontal scalp hematoma is noted. IMPRESSION: Small right frontal scalp hematoma. Mild diffuse cortical atrophy. Mild chronic ischemic white matter disease. No acute intracranial abnormality seen. Electronically Signed   By: Marijo Conception, M.D.   On: 12/02/2015 14:57   Ct Head Wo Contrast  Result Date: 11/30/2015 CLINICAL DATA:  Status post fall, with concern for head injury. Initial encounter. EXAM: CT HEAD WITHOUT CONTRAST TECHNIQUE: Contiguous axial images were obtained from the base of the skull through the vertex without intravenous contrast. COMPARISON:  None. FINDINGS: Brain: No evidence of acute infarction, hemorrhage, hydrocephalus, extra-axial collection or mass lesion/mass effect. Prominence of the ventricles and sulci reflects mild to moderate cortical volume loss, with scattered periventricular and subcortical white matter change likely reflecting small vessel ischemic microangiopathy. A chronic infarct is noted at the left occipital lobe, with associated encephalomalacia. Cerebellar atrophy is noted. The brainstem and fourth ventricle are within normal limits. The basal ganglia are unremarkable in  appearance. No mass effect or midline shift is seen. Vascular: No hyperdense vessel or unexpected calcification. Skull: There is no evidence of fracture; visualized osseous structures are unremarkable in appearance. Sinuses/Orbits: The orbits are within normal limits. The paranasal sinuses and mastoid air cells are well-aerated. Other: Soft tissue swelling is noted overlying the right frontal calvarium. IMPRESSION: 1. No evidence of traumatic intracranial injury or fracture. 2. Soft tissue swelling overlying the right frontal calvarium. 3. Mild to moderate cortical volume loss and scattered small vessel ischemic microangiopathy. 4. Chronic infarct at the left occipital lobe, with associated encephalomalacia. Electronically Signed   By: Garald Balding M.D.   On: 11/30/2015 23:35   Ct Hip Left Wo Contrast  Result Date: 12/01/2015 CLINICAL DATA:  Acute onset of left hip pain, after unwitnessed fall. Initial encounter. EXAM: CT OF THE LEFT HIP WITHOUT CONTRAST TECHNIQUE: Multidetector CT imaging of the left hip was performed according to the standard protocol. Multiplanar CT image reconstructions were also generated. COMPARISON:  Left hip radiographs performed 11/30/2015 FINDINGS: Bones/Joint/Cartilage There is a mildly displaced subcapital fracture through the left femoral neck, with posterior rotation of the distal femur. The femoral heads remain seated within their respective acetabula. Right femoral hardware is only partially imaged and appears grossly unremarkable, with surrounding heterotopic bone formation. No significant joint space narrowing is seen. Degenerative change is noted at the pubic symphysis. Minimal vacuum phenomenon is noted at the sacroiliac joints. Ligaments Suboptimally assessed by CT. Muscles and Tendons The visualized musculature is unremarkable in appearance. Prominent soft tissue scarring is noted about the right hip. Soft tissues Diffuse vascular calcifications are seen. A 3.2 cm right  adnexal cystic lesion is noted. The bladder is moderately distended and grossly unremarkable. Scattered diverticulosis is noted along the sigmoid colon, without evidence of  diverticulitis. IMPRESSION: 1. Mildly displaced subcapital fracture through the left femoral neck, with posterior rotation of the distal femur. 2. Diffuse vascular calcifications seen. 3. 3.2 cm right adnexal cystic lesion noted. Pelvic ultrasound could be considered for further evaluation, if deemed clinically appropriate. 4. Scattered diverticulosis along the sigmoid colon, without evidence of diverticulitis. Electronically Signed   By: Garald Balding M.D.   On: 12/01/2015 02:27    Assessment and Plan  Hypothyroidism Most recent TSH 0.20 on 50 mcg; will decrease synthroid to 25 mcg and rechec 6 weeks, 01/16/2016   Marie Robertson. [name], MD

## 2015-12-08 ENCOUNTER — Non-Acute Institutional Stay (SKILLED_NURSING_FACILITY): Payer: Medicare Other | Admitting: Internal Medicine

## 2015-12-08 DIAGNOSIS — M6281 Muscle weakness (generalized): Secondary | ICD-10-CM | POA: Diagnosis not present

## 2015-12-08 DIAGNOSIS — R1312 Dysphagia, oropharyngeal phase: Secondary | ICD-10-CM | POA: Diagnosis not present

## 2015-12-08 DIAGNOSIS — N179 Acute kidney failure, unspecified: Secondary | ICD-10-CM | POA: Diagnosis not present

## 2015-12-08 DIAGNOSIS — E860 Dehydration: Secondary | ICD-10-CM | POA: Diagnosis not present

## 2015-12-08 DIAGNOSIS — I129 Hypertensive chronic kidney disease with stage 1 through stage 4 chronic kidney disease, or unspecified chronic kidney disease: Secondary | ICD-10-CM | POA: Diagnosis not present

## 2015-12-08 DIAGNOSIS — R2681 Unsteadiness on feet: Secondary | ICD-10-CM | POA: Diagnosis not present

## 2015-12-08 DIAGNOSIS — S72012D Unspecified intracapsular fracture of left femur, subsequent encounter for closed fracture with routine healing: Secondary | ICD-10-CM | POA: Diagnosis not present

## 2015-12-08 LAB — BASIC METABOLIC PANEL
BUN: 57 mg/dL — AB (ref 4–21)
CREATININE: 1.6 mg/dL — AB (ref 0.5–1.1)
GLUCOSE: 98 mg/dL
Potassium: 4.2 mmol/L (ref 3.4–5.3)
Sodium: 143 mmol/L (ref 137–147)

## 2015-12-09 ENCOUNTER — Encounter: Payer: Self-pay | Admitting: Internal Medicine

## 2015-12-09 DIAGNOSIS — M6281 Muscle weakness (generalized): Secondary | ICD-10-CM | POA: Diagnosis not present

## 2015-12-09 DIAGNOSIS — R2681 Unsteadiness on feet: Secondary | ICD-10-CM | POA: Diagnosis not present

## 2015-12-09 DIAGNOSIS — R1312 Dysphagia, oropharyngeal phase: Secondary | ICD-10-CM | POA: Diagnosis not present

## 2015-12-09 DIAGNOSIS — S72012D Unspecified intracapsular fracture of left femur, subsequent encounter for closed fracture with routine healing: Secondary | ICD-10-CM | POA: Diagnosis not present

## 2015-12-10 DIAGNOSIS — S72012D Unspecified intracapsular fracture of left femur, subsequent encounter for closed fracture with routine healing: Secondary | ICD-10-CM | POA: Diagnosis not present

## 2015-12-10 DIAGNOSIS — M6281 Muscle weakness (generalized): Secondary | ICD-10-CM | POA: Diagnosis not present

## 2015-12-10 DIAGNOSIS — R2681 Unsteadiness on feet: Secondary | ICD-10-CM | POA: Diagnosis not present

## 2015-12-10 DIAGNOSIS — R1312 Dysphagia, oropharyngeal phase: Secondary | ICD-10-CM | POA: Diagnosis not present

## 2015-12-11 ENCOUNTER — Encounter: Payer: Self-pay | Admitting: Internal Medicine

## 2015-12-11 DIAGNOSIS — R1312 Dysphagia, oropharyngeal phase: Secondary | ICD-10-CM | POA: Diagnosis not present

## 2015-12-11 DIAGNOSIS — M6281 Muscle weakness (generalized): Secondary | ICD-10-CM | POA: Diagnosis not present

## 2015-12-11 DIAGNOSIS — S72012D Unspecified intracapsular fracture of left femur, subsequent encounter for closed fracture with routine healing: Secondary | ICD-10-CM | POA: Diagnosis not present

## 2015-12-11 DIAGNOSIS — R2681 Unsteadiness on feet: Secondary | ICD-10-CM | POA: Diagnosis not present

## 2015-12-11 NOTE — Progress Notes (Signed)
Location:  [facility] [place] Room Number: [phone] Place of Service:  SNF [phone])  Noah Delaine. [name], MD  Patient Care Team: Provider Not In System as PCP - General  Extended Emergency Contact Information Primary Emergency Contact: [facility] Address: [place], [place] [address] Johnnette Litter of [place] Phone: [phone] Relation: Son Secondary Emergency Contact: [place], [place] [address] Johnnette Litter of [place] Phone: [phone] Mobile Phone: [phone] Relation: Son    Allergies: Patient has no known allergies.  Chief Complaint  Patient presents with  . Acute Visit    Acute    HPI: Patient is 82 y.o. female who is being seen today because pt is not eating or drinking well 2/2 to both hip pain and the medication that is needed to treat her hip pain. Pt is still grimancing every times she moves. Labs today came back with increasing BUN, Cr is still stable.  Past Medical History:  Diagnosis Date  . Chronic atrial fibrillation ([place])   . Chronic renal insufficiency, stage III (moderate)   . Constipation   . CVA (cerebral infarction) approx 2012   residual defect: poor R sided peripheral vision per son  . Depression   . History of fracture of right hip 03/2015  . Hyperlipidemia   . Hypertension   . Hypothyroidism   . Insomnia   . Osteoarthritis    wrists, hands  . Osteoporosis     Past Surgical History:  Procedure Laterality Date  . APPENDECTOMY    . COLONOSCOPY  08/22/08  . INTRAMEDULLARY (IM) NAIL INTERTROCHANTERIC Right 03/22/2015   Procedure: RIGHT femoral HIP NAILING;  Surgeon: Gaynelle Arabian, MD;  Location: [facility];  Service: Orthopedics;  Laterality: Right;  . KNEE ARTHROSCOPY Right   . THYROID SURGERY        Medication List       Accurate as of 12/08/15 11:59 PM. Always use your most recent med list.          acetaminophen 500 MG tablet Commonly known as:  TYLENOL Take 2  tablets (1,000 mg total) by mouth every 6 (six) hours as needed for mild pain, moderate pain or headache.   atenolol 50 MG tablet Commonly known as:  TENORMIN Take 50 mg by mouth daily.   ELIQUIS 2.5 MG Tabs tablet Generic drug:  apixaban Take 2.5 mg by mouth 2 (two) times daily. 9am and 5pm   ENSURE Take 1 Can by mouth 2 (two) times daily between meals. 10 am and 2 pm   HYDROcodone-acetaminophen 5-325 MG tablet Commonly known as:  NORCO/VICODIN Take 1 tablet by mouth every 6 (six) hours. scheduled   levothyroxine 25 MCG tablet Commonly known as:  SYNTHROID, LEVOTHROID Take 25 mcg by mouth daily before breakfast.   LORazepam 0.5 MG tablet Commonly known as:  ATIVAN Take 0.5 mg by mouth 3 (three) times daily as needed (agitation).   methocarbamol 500 MG tablet Commonly known as:  ROBAXIN Take 1,000 mg by mouth every 6 (six) hours. Hold for sedation   ondansetron 4 MG disintegrating tablet Commonly known as:  ZOFRAN-ODT Take 4 mg by mouth every 8 (eight) hours as needed for nausea or vomiting.   pravastatin 40 MG tablet Commonly known as:  PRAVACHOL Take 1 tablet (40 mg total) by mouth at bedtime.   QUEtiapine 50 MG tablet Commonly known as:  SEROQUEL Take 50  mg by mouth at bedtime.   QUEtiapine 25 MG tablet Commonly known as:  SEROQUEL Take 25 mg by mouth daily.   senna 8.6 MG tablet Commonly known as:  SENOKOT Take 1 tablet by mouth at bedtime as needed for constipation.   traMADol 50 MG tablet Commonly known as:  ULTRAM 1 tab po q6h prn pain   Vitamin D-3 1000 units Caps Take 3,000 Units by mouth daily.       Meds ordered this encounter  Medications  . levothyroxine (SYNTHROID, LEVOTHROID) 25 MCG tablet    Sig: Take 25 mcg by mouth daily before breakfast.  . ENSURE (ENSURE)    Sig: Take 1 Can by mouth 2 (two) times daily between meals. 10 am and 2 pm    Immunization History  Administered Date(s) Administered  . Influenza Split 11/20/2012  .  Influenza-Unspecified 10/07/2015  . PPD Test 08/14/2015  . Pneumococcal Conjugate-13 05/22/2015  . Pneumococcal Polysaccharide-23 08/22/2008  . Tdap 01/01/2015, 08/06/2015    Social History  Substance Use Topics  . Smoking status: Never Smoker  . Smokeless tobacco: Never Used  . Alcohol use No    Review of Systems  UTO 2/2 dementia, meds , pain   Vitals:   12/08/15 1148  BP: 128/71  Pulse: 87  Resp: 18  Temp: 98 F (36.7 C)   Body mass index is 19.5 kg/m. Physical Exam  GENERAL APPEARANCE: sleeping, grmancing in her sleep with any movement SKIN: No diaphoresis rash HEENT: Unremarkable RESPIRATORY: Breathing is even, unlabored. Lung sounds are clear   CARDIOVASCULAR: Heart RRR no murmurs, rubs or gallops. No peripheral edema  GASTROINTESTINAL: Abdomen is soft, non-tender, not distended w/ normal bowel sounds.  GENITOURINARY: Bladder non tender, not distended  MUSCULOSKELETAL: No abnormal joints or musculature NEUROLOGIC: pt sleeping, slt movement of all exgtremitis PSYCHIATRIC: n/a  Patient Active Problem List   Diagnosis Date Noted  . Femoral neck fracture ([place]) 12/01/2015  . Malnutrition of moderate degree 12/01/2015  . Closed fracture of neck of left femur ([place])   . Mood disorder ([place]) 11/19/2015  . Dementia with behavioral disturbance 11/08/2015  . CKD (chronic kidney disease) stage 3, GFR 30-59 ml/min 09/17/2015  . Insomnia 08/18/2015  . Confusion 06/20/2015  . Recent urinary tract infection 06/20/2015  . Dysuria 04/10/2015  . Edema 04/06/2015  . Acute encephalopathy 03/29/2015  . Vitamin D deficiency 03/29/2015  . UTI (lower urinary tract infection) 03/21/2015  . Hip fracture ([place]) 03/20/2015  . Chronic atrial fibrillation ([place]) 02/20/2015  . Hyperlipidemia 02/20/2015  . Essential hypertension, benign 12/13/2012  . Cardiac arrhythmia 12/13/2012  . Urinary incontinence 12/13/2012  . Hypothyroidism 12/13/2012  . History of depression 12/11/2012     CMP     Component Value Date/Time   NA 141 12/01/2015 0506   NA 141 12/01/2015 0506   K 4.2 12/01/2015 0506   CL 108 12/01/2015 0506   CO2 24 12/01/2015 0506   GLUCOSE 175 (H) 12/01/2015 0506   BUN 40 (H) 12/01/2015 0506   BUN 40 (A) 12/01/2015 0506   CREATININE 1.64 (H) 12/01/2015 0506   CREATININE 1.6 (A) 12/01/2015 0506   CALCIUM 9.1 12/01/2015 0506   PROT 6.9 12/01/2015 0506   ALBUMIN 3.6 12/01/2015 0506   AST 24 12/01/2015 0506   ALT 16 12/01/2015 0506   ALKPHOS 75 12/01/2015 0506   BILITOT 0.6 12/01/2015 0506   GFRNONAA 26 (L) 12/01/2015 0506   GFRAA 30 (L) 12/01/2015 0506    Recent Labs  06/04/15  1952 08/06/15 0220 08/23/15 12/01/15 0029 12/01/15 0506  NA 138 133* 141 141  141 141  141  K 4.3 3.6 3.7 4.4  4.4 4.2  CL 104 97*  --  109 108  CO2 26 26  --   --  24  GLUCOSE 93 125*  --  111* 175*  BUN 29* 25* 25* 46*  46* 40*  40*  CREATININE 1.25* 1.44* 1.4* 1.70*  1.7* 1.64*  1.6*  CALCIUM 8.3* 8.7*  --   --  9.1    Recent Labs  02/20/15 1512 03/20/15 1700 12/01/15 0506  AST 18 27 24   ALT 10 15 16   ALKPHOS 67 51 75  BILITOT 0.4 0.8 0.6  PROT 6.7 6.4* 6.9  ALBUMIN 3.8 3.5 3.6    Recent Labs  08/06/15 0220  12/01/15 0111 12/01/15 0506 12/02/15 12/02/15 0429  WBC 9.0  < > 10.0  10.0 12.4  12.4* 11.1 11.1*  NEUTROABS 6.7  --  7.8* 11.0*  --   --   HGB 10.7*  < > 11.2*  11.2* 11.9*  11.9*  --  11.6*  HCT 33.2*  < > 35.1*  35* 36.6  37  --  35.9*  MCV 88.5  --  90.2 89.3  --  88.9  PLT 282  < > 242  242 248  248  --  227  < > = values in this interval not displayed.  Recent Labs  08/24/15  CHOL 107  LDLCALC 37  TRIG 77   No results found for: [facility] Lab Results  Component Value Date   TSH 0.20 (A) 11/30/2015   Lab Results  Component Value Date   HGBA1C 5.3 08/24/2015   Lab Results  Component Value Date   CHOL 107 08/24/2015   HDL 55 08/24/2015   LDLCALC 37 08/24/2015   TRIG 77 08/24/2015    Significant  Diagnostic Results in last 30 days:  Dg Chest 1 View  Result Date: 11/30/2015 CLINICAL DATA:  Status post unwitnessed fall, with concern for chest injury. Initial encounter. EXAM: CHEST 1 VIEW COMPARISON:  Chest radiograph performed 06/04/2015 FINDINGS: The lungs are mildly hypoexpanded. Vascular crowding and vascular congestion are seen. Mild bilateral atelectasis is seen. No pleural effusion or pneumothorax is identified. The cardiomediastinal silhouette is enlarged. No displaced rib fractures are seen. Degenerative change is noted about the right glenohumeral joint, with chronic superior subluxation of the right humeral head. A moderate hiatal hernia is suspected. IMPRESSION: 1. Lungs mildly hypoexpanded, with mild bilateral atelectasis. 2. Vascular congestion and cardiomegaly. 3. No displaced rib fracture seen. 4. Moderate hiatal hernia suspected. Electronically Signed   By: Garald Balding M.D.   On: 11/30/2015 23:58   Dg Pelvis 1-2 Views  Result Date: 12/01/2015 CLINICAL DATA:  Status post unwitnessed fall, with concern for pelvic injury. Intermittent left groin pain. Initial encounter. EXAM: PELVIS - 1-2 VIEW COMPARISON:  Pelvic radiograph performed 03/15/2015 FINDINGS: There is new cortical irregularity at the superior aspect of the left femoral neck, raising concern for a subcapital fracture of the left femoral neck. Would correlate for associated symptoms. Both femoral heads are seated normally within their respective acetabula. The patient's right femoral hardware is grossly unremarkable, though incompletely imaged. Mild degenerative change is noted at the pubic symphysis. The visualized bowel gas pattern is grossly unremarkable in appearance. IMPRESSION: New cortical irregularity at the superior aspect of the left femoral neck, raising concern for a subcapital fracture of the left femoral neck. Would correlate for  associated symptoms. Electronically Signed   By: Garald Balding M.D.   On:  12/01/2015 00:01   Ct Head Wo Contrast  Result Date: 12/02/2015 CLINICAL DATA:  Dementia. EXAM: CT HEAD WITHOUT CONTRAST TECHNIQUE: Contiguous axial images were obtained from the base of the skull through the vertex without intravenous contrast. COMPARISON:  CT scan of November 30, 2015. FINDINGS: Brain: Mild diffuse cortical atrophy is noted. Mild chronic ischemic white matter disease is noted. Old left occipital infarction is noted. No mass effect or midline shift is noted. Ventricular size is within normal limits. There is no evidence of mass lesion, hemorrhage or acute infarction. Vascular: Atherosclerosis of carotid siphons is noted. Skull: Normal. Negative for fracture or focal lesion. Sinuses/Orbits: No acute finding. Other: Small right frontal scalp hematoma is noted. IMPRESSION: Small right frontal scalp hematoma. Mild diffuse cortical atrophy. Mild chronic ischemic white matter disease. No acute intracranial abnormality seen. Electronically Signed   By: Marijo Conception, M.D.   On: 12/02/2015 14:57   Ct Head Wo Contrast  Result Date: 11/30/2015 CLINICAL DATA:  Status post fall, with concern for head injury. Initial encounter. EXAM: CT HEAD WITHOUT CONTRAST TECHNIQUE: Contiguous axial images were obtained from the base of the skull through the vertex without intravenous contrast. COMPARISON:  None. FINDINGS: Brain: No evidence of acute infarction, hemorrhage, hydrocephalus, extra-axial collection or mass lesion/mass effect. Prominence of the ventricles and sulci reflects mild to moderate cortical volume loss, with scattered periventricular and subcortical white matter change likely reflecting small vessel ischemic microangiopathy. A chronic infarct is noted at the left occipital lobe, with associated encephalomalacia. Cerebellar atrophy is noted. The brainstem and fourth ventricle are within normal limits. The basal ganglia are unremarkable in appearance. No mass effect or midline shift is seen.  Vascular: No hyperdense vessel or unexpected calcification. Skull: There is no evidence of fracture; visualized osseous structures are unremarkable in appearance. Sinuses/Orbits: The orbits are within normal limits. The paranasal sinuses and mastoid air cells are well-aerated. Other: Soft tissue swelling is noted overlying the right frontal calvarium. IMPRESSION: 1. No evidence of traumatic intracranial injury or fracture. 2. Soft tissue swelling overlying the right frontal calvarium. 3. Mild to moderate cortical volume loss and scattered small vessel ischemic microangiopathy. 4. Chronic infarct at the left occipital lobe, with associated encephalomalacia. Electronically Signed   By: Garald Balding M.D.   On: 11/30/2015 23:35   Ct Hip Left Wo Contrast  Result Date: 12/01/2015 CLINICAL DATA:  Acute onset of left hip pain, after unwitnessed fall. Initial encounter. EXAM: CT OF THE LEFT HIP WITHOUT CONTRAST TECHNIQUE: Multidetector CT imaging of the left hip was performed according to the standard protocol. Multiplanar CT image reconstructions were also generated. COMPARISON:  Left hip radiographs performed 11/30/2015 FINDINGS: Bones/Joint/Cartilage There is a mildly displaced subcapital fracture through the left femoral neck, with posterior rotation of the distal femur. The femoral heads remain seated within their respective acetabula. Right femoral hardware is only partially imaged and appears grossly unremarkable, with surrounding heterotopic bone formation. No significant joint space narrowing is seen. Degenerative change is noted at the pubic symphysis. Minimal vacuum phenomenon is noted at the sacroiliac joints. Ligaments Suboptimally assessed by CT. Muscles and Tendons The visualized musculature is unremarkable in appearance. Prominent soft tissue scarring is noted about the right hip. Soft tissues Diffuse vascular calcifications are seen. A 3.2 cm right adnexal cystic lesion is noted. The bladder is  moderately distended and grossly unremarkable. Scattered diverticulosis is noted along the sigmoid colon,  without evidence of diverticulitis. IMPRESSION: 1. Mildly displaced subcapital fracture through the left femoral neck, with posterior rotation of the distal femur. 2. Diffuse vascular calcifications seen. 3. 3.2 cm right adnexal cystic lesion noted. Pelvic ultrasound could be considered for further evaluation, if deemed clinically appropriate. 4. Scattered diverticulosis along the sigmoid colon, without evidence of diverticulitis. Electronically Signed   By: Garald Balding M.D.   On: 12/01/2015 02:27    Assessment and Plan  AKI/DEHYDRATION - due to decreased po intake 2/2 pain and to pain medications; BUN/CR  56/1.61. Have ordered IVF with NS at 75cc/ hr for 2 liters. Nurses are to encourage po's as well. .   Time spent > 25 min Bonetta Mostek D. [name], MD

## 2015-12-12 DIAGNOSIS — M6281 Muscle weakness (generalized): Secondary | ICD-10-CM | POA: Diagnosis not present

## 2015-12-12 DIAGNOSIS — R2681 Unsteadiness on feet: Secondary | ICD-10-CM | POA: Diagnosis not present

## 2015-12-12 DIAGNOSIS — S72012D Unspecified intracapsular fracture of left femur, subsequent encounter for closed fracture with routine healing: Secondary | ICD-10-CM | POA: Diagnosis not present

## 2015-12-12 DIAGNOSIS — R1312 Dysphagia, oropharyngeal phase: Secondary | ICD-10-CM | POA: Diagnosis not present

## 2015-12-13 DIAGNOSIS — R1312 Dysphagia, oropharyngeal phase: Secondary | ICD-10-CM | POA: Diagnosis not present

## 2015-12-13 DIAGNOSIS — M6281 Muscle weakness (generalized): Secondary | ICD-10-CM | POA: Diagnosis not present

## 2015-12-13 DIAGNOSIS — S72012D Unspecified intracapsular fracture of left femur, subsequent encounter for closed fracture with routine healing: Secondary | ICD-10-CM | POA: Diagnosis not present

## 2015-12-13 DIAGNOSIS — R2681 Unsteadiness on feet: Secondary | ICD-10-CM | POA: Diagnosis not present

## 2015-12-14 DIAGNOSIS — R1312 Dysphagia, oropharyngeal phase: Secondary | ICD-10-CM | POA: Diagnosis not present

## 2015-12-14 DIAGNOSIS — M6281 Muscle weakness (generalized): Secondary | ICD-10-CM | POA: Diagnosis not present

## 2015-12-14 DIAGNOSIS — S72012D Unspecified intracapsular fracture of left femur, subsequent encounter for closed fracture with routine healing: Secondary | ICD-10-CM | POA: Diagnosis not present

## 2015-12-14 DIAGNOSIS — R2681 Unsteadiness on feet: Secondary | ICD-10-CM | POA: Diagnosis not present

## 2015-12-15 DIAGNOSIS — S72012D Unspecified intracapsular fracture of left femur, subsequent encounter for closed fracture with routine healing: Secondary | ICD-10-CM | POA: Diagnosis not present

## 2015-12-15 DIAGNOSIS — R1312 Dysphagia, oropharyngeal phase: Secondary | ICD-10-CM | POA: Diagnosis not present

## 2015-12-15 DIAGNOSIS — M6281 Muscle weakness (generalized): Secondary | ICD-10-CM | POA: Diagnosis not present

## 2015-12-15 DIAGNOSIS — R2681 Unsteadiness on feet: Secondary | ICD-10-CM | POA: Diagnosis not present

## 2015-12-16 DIAGNOSIS — M6281 Muscle weakness (generalized): Secondary | ICD-10-CM | POA: Diagnosis not present

## 2015-12-16 DIAGNOSIS — S72012D Unspecified intracapsular fracture of left femur, subsequent encounter for closed fracture with routine healing: Secondary | ICD-10-CM | POA: Diagnosis not present

## 2015-12-16 DIAGNOSIS — R2681 Unsteadiness on feet: Secondary | ICD-10-CM | POA: Diagnosis not present

## 2015-12-16 DIAGNOSIS — R1312 Dysphagia, oropharyngeal phase: Secondary | ICD-10-CM | POA: Diagnosis not present

## 2015-12-17 ENCOUNTER — Encounter: Payer: Self-pay | Admitting: Internal Medicine

## 2015-12-17 NOTE — Assessment & Plan Note (Signed)
Most recent TSH 0.20 on 50 mcg; will decrease synthroid to 25 mcg and rechec 6 weeks, 01/16/2016

## 2015-12-18 DIAGNOSIS — M6281 Muscle weakness (generalized): Secondary | ICD-10-CM | POA: Diagnosis not present

## 2015-12-18 DIAGNOSIS — R1312 Dysphagia, oropharyngeal phase: Secondary | ICD-10-CM | POA: Diagnosis not present

## 2015-12-18 DIAGNOSIS — S72012D Unspecified intracapsular fracture of left femur, subsequent encounter for closed fracture with routine healing: Secondary | ICD-10-CM | POA: Diagnosis not present

## 2015-12-18 DIAGNOSIS — R2681 Unsteadiness on feet: Secondary | ICD-10-CM | POA: Diagnosis not present

## 2015-12-19 DIAGNOSIS — S72012D Unspecified intracapsular fracture of left femur, subsequent encounter for closed fracture with routine healing: Secondary | ICD-10-CM | POA: Diagnosis not present

## 2015-12-19 DIAGNOSIS — R1312 Dysphagia, oropharyngeal phase: Secondary | ICD-10-CM | POA: Diagnosis not present

## 2015-12-19 DIAGNOSIS — R2681 Unsteadiness on feet: Secondary | ICD-10-CM | POA: Diagnosis not present

## 2015-12-19 DIAGNOSIS — M6281 Muscle weakness (generalized): Secondary | ICD-10-CM | POA: Diagnosis not present

## 2015-12-20 DIAGNOSIS — R1312 Dysphagia, oropharyngeal phase: Secondary | ICD-10-CM | POA: Diagnosis not present

## 2015-12-20 DIAGNOSIS — R2681 Unsteadiness on feet: Secondary | ICD-10-CM | POA: Diagnosis not present

## 2015-12-20 DIAGNOSIS — S72012D Unspecified intracapsular fracture of left femur, subsequent encounter for closed fracture with routine healing: Secondary | ICD-10-CM | POA: Diagnosis not present

## 2015-12-20 DIAGNOSIS — M6281 Muscle weakness (generalized): Secondary | ICD-10-CM | POA: Diagnosis not present

## 2015-12-21 DIAGNOSIS — R1312 Dysphagia, oropharyngeal phase: Secondary | ICD-10-CM | POA: Diagnosis not present

## 2015-12-21 DIAGNOSIS — S72012D Unspecified intracapsular fracture of left femur, subsequent encounter for closed fracture with routine healing: Secondary | ICD-10-CM | POA: Diagnosis not present

## 2015-12-21 DIAGNOSIS — R2681 Unsteadiness on feet: Secondary | ICD-10-CM | POA: Diagnosis not present

## 2015-12-21 DIAGNOSIS — M6281 Muscle weakness (generalized): Secondary | ICD-10-CM | POA: Diagnosis not present

## 2015-12-22 DIAGNOSIS — M6281 Muscle weakness (generalized): Secondary | ICD-10-CM | POA: Diagnosis not present

## 2015-12-22 DIAGNOSIS — R1312 Dysphagia, oropharyngeal phase: Secondary | ICD-10-CM | POA: Diagnosis not present

## 2015-12-22 DIAGNOSIS — R2681 Unsteadiness on feet: Secondary | ICD-10-CM | POA: Diagnosis not present

## 2015-12-22 DIAGNOSIS — S72012D Unspecified intracapsular fracture of left femur, subsequent encounter for closed fracture with routine healing: Secondary | ICD-10-CM | POA: Diagnosis not present

## 2015-12-23 DIAGNOSIS — M6281 Muscle weakness (generalized): Secondary | ICD-10-CM | POA: Diagnosis not present

## 2015-12-23 DIAGNOSIS — S72012D Unspecified intracapsular fracture of left femur, subsequent encounter for closed fracture with routine healing: Secondary | ICD-10-CM | POA: Diagnosis not present

## 2015-12-23 DIAGNOSIS — R2681 Unsteadiness on feet: Secondary | ICD-10-CM | POA: Diagnosis not present

## 2015-12-23 DIAGNOSIS — R1312 Dysphagia, oropharyngeal phase: Secondary | ICD-10-CM | POA: Diagnosis not present

## 2015-12-25 DIAGNOSIS — R1312 Dysphagia, oropharyngeal phase: Secondary | ICD-10-CM | POA: Diagnosis not present

## 2015-12-25 DIAGNOSIS — R2681 Unsteadiness on feet: Secondary | ICD-10-CM | POA: Diagnosis not present

## 2015-12-25 DIAGNOSIS — S72012D Unspecified intracapsular fracture of left femur, subsequent encounter for closed fracture with routine healing: Secondary | ICD-10-CM | POA: Diagnosis not present

## 2015-12-25 DIAGNOSIS — M6281 Muscle weakness (generalized): Secondary | ICD-10-CM | POA: Diagnosis not present

## 2015-12-26 DIAGNOSIS — M6281 Muscle weakness (generalized): Secondary | ICD-10-CM | POA: Diagnosis not present

## 2015-12-26 DIAGNOSIS — R1312 Dysphagia, oropharyngeal phase: Secondary | ICD-10-CM | POA: Diagnosis not present

## 2015-12-26 DIAGNOSIS — S72012D Unspecified intracapsular fracture of left femur, subsequent encounter for closed fracture with routine healing: Secondary | ICD-10-CM | POA: Diagnosis not present

## 2015-12-26 DIAGNOSIS — R2681 Unsteadiness on feet: Secondary | ICD-10-CM | POA: Diagnosis not present

## 2015-12-27 DIAGNOSIS — R2681 Unsteadiness on feet: Secondary | ICD-10-CM | POA: Diagnosis not present

## 2015-12-27 DIAGNOSIS — R1312 Dysphagia, oropharyngeal phase: Secondary | ICD-10-CM | POA: Diagnosis not present

## 2015-12-27 DIAGNOSIS — M6281 Muscle weakness (generalized): Secondary | ICD-10-CM | POA: Diagnosis not present

## 2015-12-27 DIAGNOSIS — S72012D Unspecified intracapsular fracture of left femur, subsequent encounter for closed fracture with routine healing: Secondary | ICD-10-CM | POA: Diagnosis not present

## 2015-12-28 DIAGNOSIS — M6281 Muscle weakness (generalized): Secondary | ICD-10-CM | POA: Diagnosis not present

## 2015-12-28 DIAGNOSIS — R2681 Unsteadiness on feet: Secondary | ICD-10-CM | POA: Diagnosis not present

## 2015-12-28 DIAGNOSIS — R1312 Dysphagia, oropharyngeal phase: Secondary | ICD-10-CM | POA: Diagnosis not present

## 2015-12-28 DIAGNOSIS — S72012D Unspecified intracapsular fracture of left femur, subsequent encounter for closed fracture with routine healing: Secondary | ICD-10-CM | POA: Diagnosis not present

## 2015-12-29 DIAGNOSIS — S72012D Unspecified intracapsular fracture of left femur, subsequent encounter for closed fracture with routine healing: Secondary | ICD-10-CM | POA: Diagnosis not present

## 2015-12-29 DIAGNOSIS — M6281 Muscle weakness (generalized): Secondary | ICD-10-CM | POA: Diagnosis not present

## 2015-12-29 DIAGNOSIS — R2681 Unsteadiness on feet: Secondary | ICD-10-CM | POA: Diagnosis not present

## 2015-12-29 DIAGNOSIS — R1312 Dysphagia, oropharyngeal phase: Secondary | ICD-10-CM | POA: Diagnosis not present

## 2015-12-30 DIAGNOSIS — S72012D Unspecified intracapsular fracture of left femur, subsequent encounter for closed fracture with routine healing: Secondary | ICD-10-CM | POA: Diagnosis not present

## 2015-12-30 DIAGNOSIS — R1312 Dysphagia, oropharyngeal phase: Secondary | ICD-10-CM | POA: Diagnosis not present

## 2015-12-30 DIAGNOSIS — R2681 Unsteadiness on feet: Secondary | ICD-10-CM | POA: Diagnosis not present

## 2015-12-30 DIAGNOSIS — M6281 Muscle weakness (generalized): Secondary | ICD-10-CM | POA: Diagnosis not present

## 2016-01-02 DIAGNOSIS — M6281 Muscle weakness (generalized): Secondary | ICD-10-CM | POA: Diagnosis not present

## 2016-01-02 DIAGNOSIS — R2681 Unsteadiness on feet: Secondary | ICD-10-CM | POA: Diagnosis not present

## 2016-01-02 DIAGNOSIS — R1312 Dysphagia, oropharyngeal phase: Secondary | ICD-10-CM | POA: Diagnosis not present

## 2016-01-02 DIAGNOSIS — S72012D Unspecified intracapsular fracture of left femur, subsequent encounter for closed fracture with routine healing: Secondary | ICD-10-CM | POA: Diagnosis not present

## 2016-01-03 DIAGNOSIS — M6281 Muscle weakness (generalized): Secondary | ICD-10-CM | POA: Diagnosis not present

## 2016-01-03 DIAGNOSIS — R2681 Unsteadiness on feet: Secondary | ICD-10-CM | POA: Diagnosis not present

## 2016-01-03 DIAGNOSIS — S72012D Unspecified intracapsular fracture of left femur, subsequent encounter for closed fracture with routine healing: Secondary | ICD-10-CM | POA: Diagnosis not present

## 2016-01-03 DIAGNOSIS — R1312 Dysphagia, oropharyngeal phase: Secondary | ICD-10-CM | POA: Diagnosis not present

## 2016-01-04 ENCOUNTER — Non-Acute Institutional Stay (SKILLED_NURSING_FACILITY): Payer: Medicare Other | Admitting: Internal Medicine

## 2016-01-04 ENCOUNTER — Encounter: Payer: Self-pay | Admitting: Internal Medicine

## 2016-01-04 DIAGNOSIS — G301 Alzheimer's disease with late onset: Secondary | ICD-10-CM | POA: Diagnosis not present

## 2016-01-04 DIAGNOSIS — F39 Unspecified mood [affective] disorder: Secondary | ICD-10-CM

## 2016-01-04 DIAGNOSIS — N183 Chronic kidney disease, stage 3 unspecified: Secondary | ICD-10-CM

## 2016-01-04 DIAGNOSIS — R1312 Dysphagia, oropharyngeal phase: Secondary | ICD-10-CM | POA: Diagnosis not present

## 2016-01-04 DIAGNOSIS — R2681 Unsteadiness on feet: Secondary | ICD-10-CM | POA: Diagnosis not present

## 2016-01-04 DIAGNOSIS — F0281 Dementia in other diseases classified elsewhere with behavioral disturbance: Secondary | ICD-10-CM

## 2016-01-04 DIAGNOSIS — S72012D Unspecified intracapsular fracture of left femur, subsequent encounter for closed fracture with routine healing: Secondary | ICD-10-CM | POA: Diagnosis not present

## 2016-01-04 DIAGNOSIS — M6281 Muscle weakness (generalized): Secondary | ICD-10-CM | POA: Diagnosis not present

## 2016-01-04 NOTE — Progress Notes (Signed)
Location:  [place] Room Number: [phone] Place of Service:  SNF [phone])  Marie Delaine. [name], MD  Patient Care Team: Provider Not In System as PCP - General  Extended Emergency Contact Information Primary Emergency Contact: [facility] Address: [address]          [place], [place] [address] Johnnette Litter of [place] Phone: [phone] Relation: Son Secondary Emergency Contact: [place], [place] [address] Johnnette Litter of [place] Phone: [phone] Relation: Son    Allergies: Patient has no known allergies.  Chief Complaint  Patient presents with  . Medical Management of Chronic Issues    Routine Visit    HPI: Patient is 82 y.o. female who is being seen for routine issues ofCKD3, dementia and mood disorder.  Past Medical History:  Diagnosis Date  . Chronic atrial fibrillation ([place])   . Chronic renal insufficiency, stage III (moderate)   . Constipation   . CVA (cerebral infarction) approx 2012   residual defect: poor R sided peripheral vision per son  . Depression   . History of fracture of right hip 03/2015  . Hyperlipidemia   . Hypertension   . Hypothyroidism   . Insomnia   . Osteoarthritis    wrists, hands  . Osteoporosis     Past Surgical History:  Procedure Laterality Date  . APPENDECTOMY    . COLONOSCOPY  08/22/08  . INTRAMEDULLARY (IM) NAIL INTERTROCHANTERIC Right 03/22/2015   Procedure: RIGHT femoral HIP NAILING;  Surgeon: Gaynelle Arabian, MD;  Location: [facility];  Service: Orthopedics;  Laterality: Right;  . KNEE ARTHROSCOPY Right   . THYROID SURGERY      Allergies as of 01/04/2016   No Known Allergies     Medication List       Accurate as of 01/04/16 11:59 PM. Always use your most recent med list.          acetaminophen 500 MG tablet Commonly known as:  TYLENOL Take 2 tablets (1,000 mg total) by mouth every 6 (six) hours as needed for mild pain, moderate pain or headache.   atenolol 50 MG  tablet Commonly known as:  TENORMIN Take 50 mg by mouth daily.   ELIQUIS 2.5 MG Tabs tablet Generic drug:  apixaban Take 2.5 mg by mouth 2 (two) times daily. 9am and 5pm   feeding supplement Liqd Take 1 Container by mouth 2 (two) times daily between meals.   HYDROcodone-acetaminophen 5-325 MG tablet Commonly known as:  NORCO/VICODIN Take 1 tablet by mouth every 6 (six) hours. scheduled   levothyroxine 25 MCG tablet Commonly known as:  SYNTHROID, LEVOTHROID Take 25 mcg by mouth daily before breakfast.   LORazepam 0.5 MG tablet Commonly known as:  ATIVAN Take 0.5 mg by mouth 3 (three) times daily as needed (agitation).   methocarbamol 500 MG tablet Commonly known as:  ROBAXIN Take 1,000 mg by mouth every 6 (six) hours. 2 tablets .   Hold for sedation   ondansetron 4 MG disintegrating tablet Commonly known as:  ZOFRAN-ODT Take 4 mg by mouth every 8 (eight) hours as needed for nausea or vomiting.   pravastatin 40 MG tablet Commonly known as:  PRAVACHOL Take 1 tablet (40 mg total) by mouth at bedtime.   QUEtiapine 50 MG tablet Commonly known as:  SEROQUEL Take 50 mg by mouth at bedtime.   QUEtiapine 25 MG tablet Commonly known as:  SEROQUEL Take 25 mg by mouth daily.  senna 8.6 MG tablet Commonly known as:  SENOKOT Take 1 tablet by mouth at bedtime as needed for constipation.   traMADol 50 MG tablet Commonly known as:  ULTRAM 1 tab po q6h prn pain   Vitamin D-3 1000 units Caps Take 3,000 Units by mouth daily.       Meds ordered this encounter  Medications  . feeding supplement (BOOST / RESOURCE BREEZE) LIQD    Sig: Take 1 Container by mouth 2 (two) times daily between meals.    Immunization History  Administered Date(s) Administered  . Influenza Split 11/20/2012  . Influenza-Unspecified 10/07/2015  . PPD Test 08/14/2015  . Pneumococcal Conjugate-13 05/22/2015  . Pneumococcal Polysaccharide-23 08/22/2008  . Tdap 01/01/2015, 08/06/2015, 01/10/2016     Social History  Substance Use Topics  . Smoking status: Never Smoker  . Smokeless tobacco: Never Used  . Alcohol use No    Review of Systems  UTO 2/2 dementia; nursing withot cocnerns   Vitals:   01/04/16 1134  BP: (!) 146/90  Pulse: 74  Resp: 20  Temp: 97.9 F (36.6 C)   Body mass index is 19.3 kg/m. Physical Exam  GENERAL APPEARANCE: Alert, conversant, No acute distress  SKIN: No diaphoresis rash HEENT: Unremarkable RESPIRATORY: Breathing is even, unlabored. Lung sounds are clear   CARDIOVASCULAR: Heart RRR no murmurs, rubs or gallops. No peripheral edema  GASTROINTESTINAL: Abdomen is soft, non-tender, not distended w/ normal bowel sounds.  GENITOURINARY: Bladder non tender, not distended  MUSCULOSKELETAL: No abnormal joints or musculature NEUROLOGIC: Cranial nerves 2-12 grossly intact. Moves all extremities PSYCHIATRIC: dementia, no behavioral issues  Patient Active Problem List   Diagnosis Date Noted  . Femoral neck fracture ([place]) 12/01/2015  . Malnutrition of moderate degree 12/01/2015  . Closed fracture of neck of left femur ([place])   . Mood disorder ([place]) 11/19/2015  . Dementia with behavioral disturbance 11/08/2015  . CKD (chronic kidney disease) stage 3, GFR 30-59 ml/min 09/17/2015  . Insomnia 08/18/2015  . Confusion 06/20/2015  . Recent urinary tract infection 06/20/2015  . Dysuria 04/10/2015  . Edema 04/06/2015  . Acute encephalopathy 03/29/2015  . Vitamin D deficiency 03/29/2015  . UTI (lower urinary tract infection) 03/21/2015  . Hip fracture ([place]) 03/20/2015  . Chronic atrial fibrillation ([place]) 02/20/2015  . Hyperlipidemia 02/20/2015  . Essential hypertension, benign 12/13/2012  . Cardiac arrhythmia 12/13/2012  . Urinary incontinence 12/13/2012  . Hypothyroidism 12/13/2012  . History of depression 12/11/2012    CMP     Component Value Date/Time   NA 139 01/10/2016 0030   NA 143 12/08/2015   K 3.9 01/10/2016 0030   CL 103 01/10/2016  0030   CO2 23 01/10/2016 0030   GLUCOSE 111 (H) 01/10/2016 0030   BUN 31 (H) 01/10/2016 0030   BUN 57 (A) 12/08/2015   CREATININE 1.26 (H) 01/10/2016 0030   CALCIUM 8.6 (L) 01/10/2016 0030   PROT 6.9 12/01/2015 0506   ALBUMIN 3.6 12/01/2015 0506   AST 24 12/01/2015 0506   ALT 16 12/01/2015 0506   ALKPHOS 75 12/01/2015 0506   BILITOT 0.6 12/01/2015 0506   GFRNONAA 36 (L) 01/10/2016 0030   GFRAA 41 (L) 01/10/2016 0030    Recent Labs  08/06/15 0220  12/01/15 0029 12/01/15 0506 12/08/15 01/10/16 0030  NA 133*  < > 141  141 141  141 143 139  K 3.6  < > 4.4  4.4 4.2 4.2 3.9  CL 97*  --  109 108  --  103  CO2 26  --   --  24  --  23  GLUCOSE 125*  --  111* 175*  --  111*  BUN 25*  < > 46*  46* 40*  40* 57* 31*  CREATININE 1.44*  < > 1.70*  1.7* 1.64*  1.6* 1.6* 1.26*  CALCIUM 8.7*  --   --  9.1  --  8.6*  < > = values in this interval not displayed.  Recent Labs  02/20/15 1512 03/20/15 1700 12/01/15 0506  AST 18 27 24   ALT 10 15 16   ALKPHOS 67 51 75  BILITOT 0.4 0.8 0.6  PROT 6.7 6.4* 6.9  ALBUMIN 3.8 3.5 3.6    Recent Labs  12/01/15 0111 12/01/15 0506 12/02/15 12/02/15 0429 01/10/16 0030  WBC 10.0  10.0 12.4  12.4* 11.1 11.1* 7.6  NEUTROABS 7.8* 11.0*  --   --  5.4  HGB 11.2*  11.2* 11.9*  11.9*  --  11.6* 11.3*  HCT 35.1*  35* 36.6  37  --  35.9* 34.8*  MCV 90.2 89.3  --  88.9 88.8  PLT 242  242 248  248  --  227 306    Recent Labs  08/24/15  CHOL 107  LDLCALC 37  TRIG 77   No results found for: [facility] Lab Results  Component Value Date   TSH 0.20 (A) 11/30/2015   Lab Results  Component Value Date   HGBA1C 5.3 08/24/2015   Lab Results  Component Value Date   CHOL 107 08/24/2015   HDL 55 08/24/2015   LDLCALC 37 08/24/2015   TRIG 77 08/24/2015    Significant Diagnostic Results in last 30 days:  Dg Chest 1 View  Result Date: 01/10/2016 CLINICAL DATA:  Left hip fracture EXAM: CHEST 1 VIEW COMPARISON:  11/30/2015 FINDINGS:  Stable elevation of the right hemidiaphragm. Minor atelectatic appearing right lung base opacities. Left lung is clear. Pulmonary vasculature is normal. Unchanged mediastinal contours, mildly prominent due to tortuous vasculature. Severe chronic arthropathy of both shoulders. IMPRESSION: No acute cardiopulmonary findings. Electronically Signed   By: Andreas Newport M.D.   On: 01/10/2016 00:44   Ct Head Wo Contrast  Result Date: 01/10/2016 CLINICAL DATA:  Fall today EXAM: CT HEAD WITHOUT CONTRAST CT CERVICAL SPINE WITHOUT CONTRAST TECHNIQUE: Multidetector CT imaging of the head and cervical spine was performed following the standard protocol without intravenous contrast. Multiplanar CT image reconstructions of the cervical spine were also generated. COMPARISON:  12/02/2015, 08/06/2015, 06/05/2015 FINDINGS: CT HEAD FINDINGS Brain: No acute territorial infarction or intracranial hemorrhage is visualized. There is no focal mass, mass effect or midline shift. Old left occipital infarct with ex vacuole dilatation of adjacent ventricle. Moderate periventricular white matter hypodensities consistent with small vessel disease. Stable ventricular size and morphology. Vascular: No hyperdense vessels.  Carotid artery calcifications. Skull: Mastoid air cells are clear. No skull fracture is visualized. Sinuses/Orbits: Minimal mucosal thickening in the paranasal sinuses. No acute orbital abnormality. Old nasal bone deformity. Other: None CT CERVICAL SPINE FINDINGS Alignment: Stable 3.6 mm anterolisthesis of C4 on C5 and 2 mm anterolisthesis of C7 on T1. Motion artifact limits the examination as does patient positioning. Facet alignment is grossly maintained. Skull base and vertebrae: Craniovertebral junction appears intact. The vertebral bodies demonstrate grossly normal stature. There is no gross fracture. Soft tissues and spinal canal: Prevertebral soft tissue thickness appears normal. Disc levels: Moderate severe  narrowing, endplate changes and osteophyte at C4-C5, C5-C6 and C6-C7. Multilevel bilateral hypertrophic facet arthropathy.  Upper chest: Lung apices are clear. Markedly enlarged thyroid gland, right lobe larger than right with multiple heterogenous nodules. Other: None IMPRESSION: 1. No CT evidence for acute intracranial abnormality. Old left occipital infarct and moderate periventricular white matter small vessel changes. 2. Stable alignment of the cervical spine with multilevel degenerative disc disease. No gross fracture or malalignment allowing for motion artifact. 3. Enlarged thyroid gland with multiple heterogenous masses on the right, extending into the upper mediastinum as before. Electronically Signed   By: Donavan Foil M.D.   On: 01/10/2016 00:38   Ct Cervical Spine Wo Contrast  Result Date: 01/10/2016 CLINICAL DATA:  Fall today EXAM: CT HEAD WITHOUT CONTRAST CT CERVICAL SPINE WITHOUT CONTRAST TECHNIQUE: Multidetector CT imaging of the head and cervical spine was performed following the standard protocol without intravenous contrast. Multiplanar CT image reconstructions of the cervical spine were also generated. COMPARISON:  12/02/2015, 08/06/2015, 06/05/2015 FINDINGS: CT HEAD FINDINGS Brain: No acute territorial infarction or intracranial hemorrhage is visualized. There is no focal mass, mass effect or midline shift. Old left occipital infarct with ex vacuole dilatation of adjacent ventricle. Moderate periventricular white matter hypodensities consistent with small vessel disease. Stable ventricular size and morphology. Vascular: No hyperdense vessels.  Carotid artery calcifications. Skull: Mastoid air cells are clear. No skull fracture is visualized. Sinuses/Orbits: Minimal mucosal thickening in the paranasal sinuses. No acute orbital abnormality. Old nasal bone deformity. Other: None CT CERVICAL SPINE FINDINGS Alignment: Stable 3.6 mm anterolisthesis of C4 on C5 and 2 mm anterolisthesis of C7 on T1.  Motion artifact limits the examination as does patient positioning. Facet alignment is grossly maintained. Skull base and vertebrae: Craniovertebral junction appears intact. The vertebral bodies demonstrate grossly normal stature. There is no gross fracture. Soft tissues and spinal canal: Prevertebral soft tissue thickness appears normal. Disc levels: Moderate severe narrowing, endplate changes and osteophyte at C4-C5, C5-C6 and C6-C7. Multilevel bilateral hypertrophic facet arthropathy. Upper chest: Lung apices are clear. Markedly enlarged thyroid gland, right lobe larger than right with multiple heterogenous nodules. Other: None IMPRESSION: 1. No CT evidence for acute intracranial abnormality. Old left occipital infarct and moderate periventricular white matter small vessel changes. 2. Stable alignment of the cervical spine with multilevel degenerative disc disease. No gross fracture or malalignment allowing for motion artifact. 3. Enlarged thyroid gland with multiple heterogenous masses on the right, extending into the upper mediastinum as before. Electronically Signed   By: Donavan Foil M.D.   On: 01/10/2016 00:38   Dg Hip Unilat W Or Wo Pelvis 2-3 Views Left  Result Date: 01/10/2016 CLINICAL DATA:  Left hip pain after falling tonight EXAM: DG HIP (WITH OR WITHOUT PELVIS) 2-3V LEFT COMPARISON:  08/04/2015 FINDINGS: There is a subcapital left hip fracture with foreshortening. No dislocation. Prior intramedullary right femoral fixation. IMPRESSION: Subcapital left hip fracture. Electronically Signed   By: Andreas Newport M.D.   On: 01/10/2016 00:41    Assessment and Plan  CKD (chronic kidney disease) stage 3, GFR 30-59 ml/min Recent GFR 41; BUN/Cr 31/1.26; stable;will cont to monitor at intervals  Dementia with behavioral disturbance Chronic and stable with no major declines; cont supportive care  Mood disorder (HCC) Stable;cont seroquel 50 mg qHS    Mycala Warshawsky D. [name], MD

## 2016-01-06 DIAGNOSIS — M6281 Muscle weakness (generalized): Secondary | ICD-10-CM | POA: Diagnosis not present

## 2016-01-06 DIAGNOSIS — R2681 Unsteadiness on feet: Secondary | ICD-10-CM | POA: Diagnosis not present

## 2016-01-06 DIAGNOSIS — S72012D Unspecified intracapsular fracture of left femur, subsequent encounter for closed fracture with routine healing: Secondary | ICD-10-CM | POA: Diagnosis not present

## 2016-01-06 DIAGNOSIS — R1312 Dysphagia, oropharyngeal phase: Secondary | ICD-10-CM | POA: Diagnosis not present

## 2016-01-08 DIAGNOSIS — R1312 Dysphagia, oropharyngeal phase: Secondary | ICD-10-CM | POA: Diagnosis not present

## 2016-01-08 DIAGNOSIS — S72012D Unspecified intracapsular fracture of left femur, subsequent encounter for closed fracture with routine healing: Secondary | ICD-10-CM | POA: Diagnosis not present

## 2016-01-09 ENCOUNTER — Encounter (HOSPITAL_COMMUNITY): Payer: Self-pay | Admitting: Emergency Medicine

## 2016-01-09 ENCOUNTER — Emergency Department (HOSPITAL_COMMUNITY): Payer: Medicare Other

## 2016-01-09 ENCOUNTER — Emergency Department (HOSPITAL_COMMUNITY)
Admission: EM | Admit: 2016-01-09 | Discharge: 2016-01-10 | Disposition: A | Discharge status: Home / Self Care | Payer: Medicare Other | Attending: Emergency Medicine | Admitting: Emergency Medicine

## 2016-01-09 DIAGNOSIS — I129 Hypertensive chronic kidney disease with stage 1 through stage 4 chronic kidney disease, or unspecified chronic kidney disease: Secondary | ICD-10-CM | POA: Insufficient documentation

## 2016-01-09 DIAGNOSIS — Z23 Encounter for immunization: Secondary | ICD-10-CM | POA: Insufficient documentation

## 2016-01-09 DIAGNOSIS — S064X0A Epidural hemorrhage without loss of consciousness, initial encounter: Secondary | ICD-10-CM | POA: Diagnosis not present

## 2016-01-09 DIAGNOSIS — S0990XA Unspecified injury of head, initial encounter: Secondary | ICD-10-CM | POA: Diagnosis present

## 2016-01-09 DIAGNOSIS — Y9289 Other specified places as the place of occurrence of the external cause: Secondary | ICD-10-CM | POA: Diagnosis not present

## 2016-01-09 DIAGNOSIS — Z8673 Personal history of transient ischemic attack (TIA), and cerebral infarction without residual deficits: Secondary | ICD-10-CM | POA: Diagnosis not present

## 2016-01-09 DIAGNOSIS — R1312 Dysphagia, oropharyngeal phase: Secondary | ICD-10-CM | POA: Diagnosis not present

## 2016-01-09 DIAGNOSIS — Y999 Unspecified external cause status: Secondary | ICD-10-CM | POA: Diagnosis not present

## 2016-01-09 DIAGNOSIS — Y939 Activity, unspecified: Secondary | ICD-10-CM | POA: Diagnosis not present

## 2016-01-09 DIAGNOSIS — N183 Chronic kidney disease, stage 3 (moderate): Secondary | ICD-10-CM | POA: Diagnosis not present

## 2016-01-09 DIAGNOSIS — W19XXXA Unspecified fall, initial encounter: Secondary | ICD-10-CM | POA: Insufficient documentation

## 2016-01-09 DIAGNOSIS — Z79899 Other long term (current) drug therapy: Secondary | ICD-10-CM | POA: Insufficient documentation

## 2016-01-09 DIAGNOSIS — S72002A Fracture of unspecified part of neck of left femur, initial encounter for closed fracture: Secondary | ICD-10-CM | POA: Diagnosis not present

## 2016-01-09 DIAGNOSIS — Z7901 Long term (current) use of anticoagulants: Secondary | ICD-10-CM | POA: Insufficient documentation

## 2016-01-09 DIAGNOSIS — S72001G Fracture of unspecified part of neck of right femur, subsequent encounter for closed fracture with delayed healing: Secondary | ICD-10-CM | POA: Diagnosis not present

## 2016-01-09 DIAGNOSIS — R918 Other nonspecific abnormal finding of lung field: Secondary | ICD-10-CM | POA: Diagnosis not present

## 2016-01-09 DIAGNOSIS — E039 Hypothyroidism, unspecified: Secondary | ICD-10-CM | POA: Insufficient documentation

## 2016-01-09 DIAGNOSIS — S199XXA Unspecified injury of neck, initial encounter: Secondary | ICD-10-CM | POA: Diagnosis not present

## 2016-01-09 DIAGNOSIS — S0001XA Abrasion of scalp, initial encounter: Secondary | ICD-10-CM | POA: Insufficient documentation

## 2016-01-09 DIAGNOSIS — S72002G Fracture of unspecified part of neck of left femur, subsequent encounter for closed fracture with delayed healing: Secondary | ICD-10-CM | POA: Insufficient documentation

## 2016-01-09 DIAGNOSIS — S72012D Unspecified intracapsular fracture of left femur, subsequent encounter for closed fracture with routine healing: Secondary | ICD-10-CM | POA: Diagnosis not present

## 2016-01-09 DIAGNOSIS — S098XXA Other specified injuries of head, initial encounter: Secondary | ICD-10-CM | POA: Diagnosis not present

## 2016-01-09 MED ORDER — TETANUS-DIPHTH-ACELL PERTUSSIS 5-2.5-18.5 LF-MCG/0.5 IM SUSP
0.5000 mL | Freq: Once | INTRAMUSCULAR | Status: AC
  Administered 2016-01-10: 0.5 mL via INTRAMUSCULAR
  Filled 2016-01-09: qty 0.5

## 2016-01-09 NOTE — ED Triage Notes (Signed)
BIB EMS from [facility] living facility. Pt had unwitnessed fall. Takes eliquis. Hx dementia, per facility pt at baseline. Hematoma and small lac to back of head. Reports L leg pain. VSS.

## 2016-01-09 NOTE — ED Provider Notes (Signed)
[facility] DEPT Provider Note   CSN: [id number] Arrival date & time: 01/09/16  2250  By signing my name below, I, Gwenlyn Fudge, attest that this documentation has been prepared under the direction and in the presence of Merryl Hacker, MD. Electronically Signed: Gwenlyn Fudge, ED Scribe. 01/09/16. 11:39 PM.   History   Chief Complaint Chief Complaint  Patient presents with  . Fall   LEVEL 5 CAVEAT: HPI and ROS limited due to Dementia  The history is provided by the patient. No language interpreter was used.   HPI Comments: Marie Robertson is a 83 y.o. female who presents to the Emergency Department s/p unwitnessed fall. Per triage note, pt was brought in by EMS from [facility] facility with a hematoma and small laceration to the back of the head. Per facility, pt is at baseline.   Patient noncontributory to history taking. She believes it is 85. She is only oriented to herself.  Past Medical History:  Diagnosis Date  . Chronic atrial fibrillation ([place])   . Chronic renal insufficiency, stage III (moderate)   . Constipation   . CVA (cerebral infarction) approx 2012   residual defect: poor R sided peripheral vision per son  . Depression   . History of fracture of right hip 03/2015  . Hyperlipidemia   . Hypertension   . Hypothyroidism   . Insomnia   . Osteoarthritis    wrists, hands  . Osteoporosis     Patient Active Problem List   Diagnosis Date Noted  . Femoral neck fracture ([place]) 12/01/2015  . Malnutrition of moderate degree 12/01/2015  . Closed fracture of neck of left femur ([place])   . Mood disorder ([place]) 11/19/2015  . Dementia with behavioral disturbance 11/08/2015  . CKD (chronic kidney disease) stage 3, GFR 30-59 ml/min 09/17/2015  . Insomnia 08/18/2015  . Confusion 06/20/2015  . Recent urinary tract infection 06/20/2015  . Dysuria 04/10/2015  . Edema 04/06/2015  . Acute encephalopathy 03/29/2015  . Vitamin D deficiency 03/29/2015  . UTI (lower  urinary tract infection) 03/21/2015  . Hip fracture ([place]) 03/20/2015  . Chronic atrial fibrillation ([place]) 02/20/2015  . Hyperlipidemia 02/20/2015  . Essential hypertension, benign 12/13/2012  . Cardiac arrhythmia 12/13/2012  . Urinary incontinence 12/13/2012  . Hypothyroidism 12/13/2012  . History of depression 12/11/2012    Past Surgical History:  Procedure Laterality Date  . APPENDECTOMY    . COLONOSCOPY  08/22/08  . INTRAMEDULLARY (IM) NAIL INTERTROCHANTERIC Right 03/22/2015   Procedure: RIGHT femoral HIP NAILING;  Surgeon: Gaynelle Arabian, MD;  Location: [facility];  Service: Orthopedics;  Laterality: Right;  . KNEE ARTHROSCOPY Right   . THYROID SURGERY      OB History    No data available       Home Medications    Prior to Admission medications   Medication Sig Start Date End Date Taking? Authorizing Provider  acetaminophen (TYLENOL) 500 MG tablet Take 2 tablets (1,000 mg total) by mouth every 6 (six) hours as needed for mild pain, moderate pain or headache. 08/06/15  Yes Kristen N Ward, DO  apixaban (ELIQUIS) 2.5 MG TABS tablet Take 2.5 mg by mouth 2 (two) times daily. 9am and 5pm   Yes Historical Provider, MD  atenolol (TENORMIN) 50 MG tablet Take 50 mg by mouth daily.   Yes Historical Provider, MD  Cholecalciferol (VITAMIN D-3) 1000 units CAPS Take 3,000 Units by mouth daily.    Yes Historical Provider, MD  ENSURE (ENSURE) Take 237 mLs by  mouth 2 (two) times daily between meals.   Yes Historical Provider, MD  feeding supplement (BOOST / RESOURCE BREEZE) LIQD Take 1 Container by mouth 2 (two) times daily between meals.   Yes Historical Provider, MD  levothyroxine (SYNTHROID, LEVOTHROID) 25 MCG tablet Take 25 mcg by mouth daily before breakfast.   Yes Historical Provider, MD  ondansetron (ZOFRAN-ODT) 4 MG disintegrating tablet Take 4 mg by mouth every 8 (eight) hours as needed for nausea or vomiting.   Yes Historical Provider, MD  pravastatin (PRAVACHOL) 40 MG tablet Take 1 tablet  (40 mg total) by mouth at bedtime. 02/23/15  Yes Tammi Sou, MD  QUEtiapine (SEROQUEL) 25 MG tablet Take 25 mg by mouth daily.   Yes Historical Provider, MD  QUEtiapine (SEROQUEL) 50 MG tablet Take 50 mg by mouth at bedtime.   Yes Historical Provider, MD  senna (SENOKOT) 8.6 MG tablet Take 1 tablet by mouth at bedtime as needed for constipation.    Yes Historical Provider, MD  tizanidine (ZANAFLEX) 2 MG capsule Take 2 mg by mouth 3 (three) times daily.   Yes Historical Provider, MD  traMADol (ULTRAM) 50 MG tablet 1 tab po q6h prn pain Patient not taking: Reported on 01/10/2016 06/09/15   Tammi Sou, MD    Family History Family History  Problem Relation Age of Onset  . Stroke Mother   . Diabetes Mother   . Alcohol abuse Father     Social History Social History  Substance Use Topics  . Smoking status: Never Smoker  . Smokeless tobacco: Never Used  . Alcohol use No     Allergies   Patient has no known allergies.  Review of Systems Review of Systems  Unable to perform ROS: Dementia    Physical Exam Updated Vital Signs BP 142/90 (BP Location: Left Arm)   Pulse 78   Temp 98.5 F (36.9 C) (Oral)   Resp 18   Ht 5\' 5"  (1.651 m)   Wt 115 lb (52.2 kg)   LMP 01/08/1972   SpO2 100%   BMI 19.14 kg/m   Physical Exam  Constitutional: No distress.  Elderly, frail, no acute distress  HENT:  Head: Normocephalic.  Small hematoma with abrasion to the occiput, bleeding controlled  Eyes: Pupils are equal, round, and reactive to light.  Neck: Normal range of motion.  Cardiovascular: Normal rate, regular rhythm and normal heart sounds.   No murmur heard. Pulmonary/Chest: Effort normal and breath sounds normal. No respiratory distress. She has no wheezes.  Abdominal: Soft. There is no tenderness.  Musculoskeletal: Normal range of motion.  Normal range of motion with bilateral hips and knees, neurovascular intact distally, pain with range of motion of the left hip, no  foreshortening or deformity noted  Neurological: She is alert.  Oriented only to herself  Skin: Skin is warm and dry.  Psychiatric: She has a normal mood and affect.  Nursing note and vitals reviewed.    ED Treatments / Results  DIAGNOSTIC STUDIES: Oxygen Saturation is 100% on RA, normal by my interpretation.    Labs (all labs ordered are listed, but only abnormal results are displayed) Labs Reviewed  URINALYSIS, ROUTINE W REFLEX MICROSCOPIC - Abnormal; Notable for the following:       Result Value   APPearance CLOUDY (*)    Hgb urine dipstick SMALL (*)    Protein, ur 100 (*)    Leukocytes, UA SMALL (*)    Bacteria, UA RARE (*)    Squamous Epithelial /  LPF 0-5 (*)    All other components within normal limits  CBC WITH DIFFERENTIAL/PLATELET - Abnormal; Notable for the following:    Hemoglobin 11.3 (*)    HCT 34.8 (*)    RDW 17.3 (*)    All other components within normal limits  BASIC METABOLIC PANEL - Abnormal; Notable for the following:    Glucose, Bld 111 (*)    BUN 31 (*)    Creatinine, Ser 1.26 (*)    Calcium 8.6 (*)    GFR calc non Af Amer 36 (*)    GFR calc Af Amer 41 (*)    All other components within normal limits    EKG  EKG Interpretation None       Radiology Dg Chest 1 View  Result Date: 01/10/2016 CLINICAL DATA:  Left hip fracture EXAM: CHEST 1 VIEW COMPARISON:  11/30/2015 FINDINGS: Stable elevation of the right hemidiaphragm. Minor atelectatic appearing right lung base opacities. Left lung is clear. Pulmonary vasculature is normal. Unchanged mediastinal contours, mildly prominent due to tortuous vasculature. Severe chronic arthropathy of both shoulders. IMPRESSION: No acute cardiopulmonary findings. Electronically Signed   By: Andreas Newport M.D.   On: 01/10/2016 00:44   Ct Head Wo Contrast  Result Date: 01/10/2016 CLINICAL DATA:  Fall today EXAM: CT HEAD WITHOUT CONTRAST CT CERVICAL SPINE WITHOUT CONTRAST TECHNIQUE: Multidetector CT imaging of the  head and cervical spine was performed following the standard protocol without intravenous contrast. Multiplanar CT image reconstructions of the cervical spine were also generated. COMPARISON:  12/02/2015, 08/06/2015, 06/05/2015 FINDINGS: CT HEAD FINDINGS Brain: No acute territorial infarction or intracranial hemorrhage is visualized. There is no focal mass, mass effect or midline shift. Old left occipital infarct with ex vacuole dilatation of adjacent ventricle. Moderate periventricular white matter hypodensities consistent with small vessel disease. Stable ventricular size and morphology. Vascular: No hyperdense vessels.  Carotid artery calcifications. Skull: Mastoid air cells are clear. No skull fracture is visualized. Sinuses/Orbits: Minimal mucosal thickening in the paranasal sinuses. No acute orbital abnormality. Old nasal bone deformity. Other: None CT CERVICAL SPINE FINDINGS Alignment: Stable 3.6 mm anterolisthesis of C4 on C5 and 2 mm anterolisthesis of C7 on T1. Motion artifact limits the examination as does patient positioning. Facet alignment is grossly maintained. Skull base and vertebrae: Craniovertebral junction appears intact. The vertebral bodies demonstrate grossly normal stature. There is no gross fracture. Soft tissues and spinal canal: Prevertebral soft tissue thickness appears normal. Disc levels: Moderate severe narrowing, endplate changes and osteophyte at C4-C5, C5-C6 and C6-C7. Multilevel bilateral hypertrophic facet arthropathy. Upper chest: Lung apices are clear. Markedly enlarged thyroid gland, right lobe larger than right with multiple heterogenous nodules. Other: None IMPRESSION: 1. No CT evidence for acute intracranial abnormality. Old left occipital infarct and moderate periventricular white matter small vessel changes. 2. Stable alignment of the cervical spine with multilevel degenerative disc disease. No gross fracture or malalignment allowing for motion artifact. 3. Enlarged  thyroid gland with multiple heterogenous masses on the right, extending into the upper mediastinum as before. Electronically Signed   By: Donavan Foil M.D.   On: 01/10/2016 00:38   Ct Cervical Spine Wo Contrast  Result Date: 01/10/2016 CLINICAL DATA:  Fall today EXAM: CT HEAD WITHOUT CONTRAST CT CERVICAL SPINE WITHOUT CONTRAST TECHNIQUE: Multidetector CT imaging of the head and cervical spine was performed following the standard protocol without intravenous contrast. Multiplanar CT image reconstructions of the cervical spine were also generated. COMPARISON:  12/02/2015, 08/06/2015, 06/05/2015 FINDINGS: CT HEAD FINDINGS Brain:  No acute territorial infarction or intracranial hemorrhage is visualized. There is no focal mass, mass effect or midline shift. Old left occipital infarct with ex vacuole dilatation of adjacent ventricle. Moderate periventricular white matter hypodensities consistent with small vessel disease. Stable ventricular size and morphology. Vascular: No hyperdense vessels.  Carotid artery calcifications. Skull: Mastoid air cells are clear. No skull fracture is visualized. Sinuses/Orbits: Minimal mucosal thickening in the paranasal sinuses. No acute orbital abnormality. Old nasal bone deformity. Other: None CT CERVICAL SPINE FINDINGS Alignment: Stable 3.6 mm anterolisthesis of C4 on C5 and 2 mm anterolisthesis of C7 on T1. Motion artifact limits the examination as does patient positioning. Facet alignment is grossly maintained. Skull base and vertebrae: Craniovertebral junction appears intact. The vertebral bodies demonstrate grossly normal stature. There is no gross fracture. Soft tissues and spinal canal: Prevertebral soft tissue thickness appears normal. Disc levels: Moderate severe narrowing, endplate changes and osteophyte at C4-C5, C5-C6 and C6-C7. Multilevel bilateral hypertrophic facet arthropathy. Upper chest: Lung apices are clear. Markedly enlarged thyroid gland, right lobe larger than  right with multiple heterogenous nodules. Other: None IMPRESSION: 1. No CT evidence for acute intracranial abnormality. Old left occipital infarct and moderate periventricular white matter small vessel changes. 2. Stable alignment of the cervical spine with multilevel degenerative disc disease. No gross fracture or malalignment allowing for motion artifact. 3. Enlarged thyroid gland with multiple heterogenous masses on the right, extending into the upper mediastinum as before. Electronically Signed   By: Donavan Foil M.D.   On: 01/10/2016 00:38   Dg Hip Unilat W Or Wo Pelvis 2-3 Views Left  Result Date: 01/10/2016 CLINICAL DATA:  Left hip pain after falling tonight EXAM: DG HIP (WITH OR WITHOUT PELVIS) 2-3V LEFT COMPARISON:  08/04/2015 FINDINGS: There is a subcapital left hip fracture with foreshortening. No dislocation. Prior intramedullary right femoral fixation. IMPRESSION: Subcapital left hip fracture. Electronically Signed   By: Andreas Newport M.D.   On: 01/10/2016 00:41    Procedures Procedures (including critical care time)  Medications Ordered in ED Medications  Tdap (BOOSTRIX) injection 0.5 mL (0.5 mLs Intramuscular Given 01/10/16 0001)     Initial Impression / Assessment and Plan / ED Course  I have reviewed the triage vital signs and the nursing notes.  Pertinent labs & imaging results that were available during my care of the patient were reviewed by me and considered in my medical decision making (see chart for details).  Clinical Course     Patient presents following a fall. History limited secondary to dementia. She has an abrasion to her scalp. Reports left hip pain with fracture on x-ray. Chart review reveals recent left hip fracture. Conservative management recommended by Dr. Percell Miller. Nonweightbearing and patient is wheelchair-bound. Patient is DO NOT RESUSCITATE.  Son is agreeable to continued medical management. CT head and neck negative. Patient is at her  baseline.  After history, exam, and medical workup I feel the patient has been appropriately medically screened and is safe for discharge home. Pertinent diagnoses were discussed with the patient. Patient was given return precautions.   Final Clinical Impressions(s) / ED Diagnoses   Final diagnoses:  Fall, initial encounter  Closed fracture of left hip with delayed healing, subsequent encounter  Abrasion of scalp, initial encounter    New Prescriptions New Prescriptions   No medications on file   I personally performed the services described in this documentation, which was scribed in my presence. The recorded information has been reviewed and is accurate.  Merryl Hacker, MD 01/10/16 [phone]

## 2016-01-10 ENCOUNTER — Emergency Department (HOSPITAL_COMMUNITY): Payer: Medicare Other

## 2016-01-10 DIAGNOSIS — S0990XA Unspecified injury of head, initial encounter: Secondary | ICD-10-CM | POA: Diagnosis not present

## 2016-01-10 DIAGNOSIS — S72002A Fracture of unspecified part of neck of left femur, initial encounter for closed fracture: Secondary | ICD-10-CM | POA: Diagnosis not present

## 2016-01-10 DIAGNOSIS — S0001XA Abrasion of scalp, initial encounter: Secondary | ICD-10-CM | POA: Diagnosis not present

## 2016-01-10 DIAGNOSIS — S72012D Unspecified intracapsular fracture of left femur, subsequent encounter for closed fracture with routine healing: Secondary | ICD-10-CM | POA: Diagnosis not present

## 2016-01-10 DIAGNOSIS — R918 Other nonspecific abnormal finding of lung field: Secondary | ICD-10-CM | POA: Diagnosis not present

## 2016-01-10 DIAGNOSIS — R1312 Dysphagia, oropharyngeal phase: Secondary | ICD-10-CM | POA: Diagnosis not present

## 2016-01-10 DIAGNOSIS — S199XXA Unspecified injury of neck, initial encounter: Secondary | ICD-10-CM | POA: Diagnosis not present

## 2016-01-10 LAB — URINALYSIS, ROUTINE W REFLEX MICROSCOPIC
Bilirubin Urine: NEGATIVE
GLUCOSE, UA: NEGATIVE mg/dL
Ketones, ur: NEGATIVE mg/dL
Nitrite: NEGATIVE
PH: 7 (ref 5.0–8.0)
PROTEIN: 100 mg/dL — AB
Specific Gravity, Urine: 1.012 (ref 1.005–1.030)

## 2016-01-10 LAB — CBC WITH DIFFERENTIAL/PLATELET
BASOS PCT: 0 %
Basophils Absolute: 0 10*3/uL (ref 0.0–0.1)
EOS ABS: 0.1 10*3/uL (ref 0.0–0.7)
Eosinophils Relative: 1 %
HCT: 34.8 % — ABNORMAL LOW (ref 36.0–46.0)
Hemoglobin: 11.3 g/dL — ABNORMAL LOW (ref 12.0–15.0)
LYMPHS ABS: 1.4 10*3/uL (ref 0.7–4.0)
Lymphocytes Relative: 18 %
MCH: 28.8 pg (ref 26.0–34.0)
MCHC: 32.5 g/dL (ref 30.0–36.0)
MCV: 88.8 fL (ref 78.0–100.0)
Monocytes Absolute: 0.7 10*3/uL (ref 0.1–1.0)
Monocytes Relative: 10 %
NEUTROS PCT: 71 %
Neutro Abs: 5.4 10*3/uL (ref 1.7–7.7)
Platelets: 306 10*3/uL (ref 150–400)
RBC: 3.92 MIL/uL (ref 3.87–5.11)
RDW: 17.3 % — ABNORMAL HIGH (ref 11.5–15.5)
WBC: 7.6 10*3/uL (ref 4.0–10.5)

## 2016-01-10 LAB — BASIC METABOLIC PANEL
Anion gap: 13 (ref 5–15)
BUN: 31 mg/dL — AB (ref 4–21)
BUN: 31 mg/dL — ABNORMAL HIGH (ref 6–20)
CO2: 23 mmol/L (ref 22–32)
CREATININE: 1.3 mg/dL — AB (ref 0.5–1.1)
Calcium: 8.6 mg/dL — ABNORMAL LOW (ref 8.9–10.3)
Chloride: 103 mmol/L (ref 101–111)
Creatinine, Ser: 1.26 mg/dL — ABNORMAL HIGH (ref 0.44–1.00)
GFR calc non Af Amer: 36 mL/min — ABNORMAL LOW (ref >60)
GFR, EST AFRICAN AMERICAN: 41 mL/min — AB (ref >60)
GLUCOSE: 111 mg/dL
Glucose, Bld: 111 mg/dL — ABNORMAL HIGH (ref 65–99)
POTASSIUM: 3.9 mmol/L (ref 3.5–5.1)
SODIUM: 139 mmol/L (ref 135–145)
Sodium: 139 mmol/L (ref 137–147)

## 2016-01-10 LAB — CBC AND DIFFERENTIAL: WBC: 7.6 10^3/mL

## 2016-01-10 NOTE — ED Notes (Signed)
[facility] called, report called to Tat Momoli

## 2016-01-10 NOTE — Discharge Instructions (Signed)
Patient was seen today after fall. Negative head CT. She has a persistent left hip fracture. Continue nonweightbearing and medical pain management as previously arranged.

## 2016-01-10 NOTE — ED Notes (Signed)
[place] MD speaking with son via telephone.

## 2016-01-14 ENCOUNTER — Encounter: Payer: Self-pay | Admitting: Internal Medicine

## 2016-01-14 NOTE — Assessment & Plan Note (Signed)
Recent GFR 41; BUN/Cr 31/1.26; stable;will cont to monitor at intervals

## 2016-01-14 NOTE — Assessment & Plan Note (Signed)
Chronic and stable with no major declines; cont supportive care

## 2016-01-14 NOTE — Assessment & Plan Note (Signed)
Stable;cont seroquel 50 mg qHS 

## 2016-01-16 DIAGNOSIS — E039 Hypothyroidism, unspecified: Secondary | ICD-10-CM | POA: Diagnosis not present

## 2016-01-16 DIAGNOSIS — I10 Essential (primary) hypertension: Secondary | ICD-10-CM | POA: Diagnosis not present

## 2016-01-16 DIAGNOSIS — N390 Urinary tract infection, site not specified: Secondary | ICD-10-CM | POA: Diagnosis not present

## 2016-01-16 DIAGNOSIS — I129 Hypertensive chronic kidney disease with stage 1 through stage 4 chronic kidney disease, or unspecified chronic kidney disease: Secondary | ICD-10-CM | POA: Diagnosis not present

## 2016-01-16 LAB — TSH: TSH: 0.45 u[IU]/mL (ref 0.41–5.90)

## 2016-01-31 DIAGNOSIS — G4700 Insomnia, unspecified: Secondary | ICD-10-CM | POA: Diagnosis not present

## 2016-01-31 DIAGNOSIS — F29 Unspecified psychosis not due to a substance or known physiological condition: Secondary | ICD-10-CM | POA: Diagnosis not present

## 2016-01-31 DIAGNOSIS — F419 Anxiety disorder, unspecified: Secondary | ICD-10-CM | POA: Diagnosis not present

## 2016-02-06 ENCOUNTER — Non-Acute Institutional Stay (SKILLED_NURSING_FACILITY): Payer: Medicare Other | Admitting: Internal Medicine

## 2016-02-06 ENCOUNTER — Encounter: Payer: Self-pay | Admitting: Internal Medicine

## 2016-02-06 DIAGNOSIS — E559 Vitamin D deficiency, unspecified: Secondary | ICD-10-CM | POA: Diagnosis not present

## 2016-02-06 DIAGNOSIS — I482 Chronic atrial fibrillation, unspecified: Secondary | ICD-10-CM

## 2016-02-06 DIAGNOSIS — E034 Atrophy of thyroid (acquired): Secondary | ICD-10-CM | POA: Diagnosis not present

## 2016-02-06 NOTE — Progress Notes (Signed)
Location:  [facility] Room Number: [phone] Place of Service:  SNF [phone])  Noah Delaine. [name], MD  Patient Care Team: Provider Not In System as PCP - General  Extended Emergency Contact Information Primary Emergency Contact: [facility] Address: [address]          [place], [place] [address] Johnnette Litter of [place] Phone: [phone] Relation: Son Secondary Emergency Contact: [place], [place] [address] Johnnette Litter of [place] Phone: [phone] Relation: Son    Allergies: Patient has no known allergies.  Chief Complaint  Patient presents with  . Medical Management of Chronic Issues    Routine Visit    HPI: Patient is 83 y.o. female who   Past Medical History:  Diagnosis Date  . Chronic atrial fibrillation ([place])   . Chronic renal insufficiency, stage III (moderate)   . Constipation   . CVA (cerebral infarction) approx 2012   residual defect: poor R sided peripheral vision per son  . Depression   . History of fracture of right hip 03/2015  . Hyperlipidemia   . Hypertension   . Hypothyroidism   . Insomnia   . Osteoarthritis    wrists, hands  . Osteoporosis     Past Surgical History:  Procedure Laterality Date  . APPENDECTOMY    . COLONOSCOPY  08/22/08  . INTRAMEDULLARY (IM) NAIL INTERTROCHANTERIC Right 03/22/2015   Procedure: RIGHT femoral HIP NAILING;  Surgeon: Gaynelle Arabian, MD;  Location: [facility];  Service: Orthopedics;  Laterality: Right;  . KNEE ARTHROSCOPY Right   . THYROID SURGERY      Allergies as of 02/06/2016   No Known Allergies     Medication List       Accurate as of 02/06/16 11:59 PM. Always use your most recent med list.          acetaminophen 500 MG tablet Commonly known as:  TYLENOL Take 2 tablets (1,000 mg total) by mouth every 6 (six) hours as needed for mild pain, moderate pain or headache.   atenolol 50 MG tablet Commonly known as:  TENORMIN Take 50 mg by mouth daily.     ELIQUIS 2.5 MG Tabs tablet Generic drug:  apixaban Take 2.5 mg by mouth 2 (two) times daily. 9am and 5pm   feeding supplement Liqd Take 1 Container by mouth 2 (two) times daily between meals.   ENSURE Take 237 mLs by mouth 2 (two) times daily between meals. 10 am and 2 pm   levothyroxine 25 MCG tablet Commonly known as:  SYNTHROID, LEVOTHROID Take 25 mcg by mouth daily before breakfast.   ondansetron 4 MG disintegrating tablet Commonly known as:  ZOFRAN-ODT Take 4 mg by mouth every 8 (eight) hours as needed for nausea or vomiting.   pravastatin 40 MG tablet Commonly known as:  PRAVACHOL Take 1 tablet (40 mg total) by mouth at bedtime.   QUEtiapine 25 MG tablet Commonly known as:  SEROQUEL Take 25 mg by mouth daily.   QUEtiapine 50 MG tablet Commonly known as:  SEROQUEL Take 50 mg by mouth at bedtime.   senna 8.6 MG tablet Commonly known as:  SENOKOT Take 1 tablet by mouth at bedtime as needed for constipation.   tizanidine 2 MG capsule Commonly known as:  ZANAFLEX Take 2 mg by mouth every 8 (eight) hours.   Vitamin D-3 1000 units Caps Take 3,000 Units by mouth daily.       No  orders of the defined types were placed in this encounter.   Immunization History  Administered Date(s) Administered  . Influenza Split 11/20/2012  . Influenza-Unspecified 10/07/2015  . PPD Test 08/14/2015  . Pneumococcal Conjugate-13 05/22/2015  . Pneumococcal Polysaccharide-23 08/22/2008  . Tdap 01/01/2015, 08/06/2015, 01/10/2016    Social History  Substance Use Topics  . Smoking status: Never Smoker  . Smokeless tobacco: Never Used  . Alcohol use No    Review of Systems  UTO 2/2 dementia; nursing without cocnderns    Vitals:   02/06/16 0848  BP: 126/82  Pulse: 94  Resp: 18  Temp: 97.6 F (36.4 C)   Body mass index is 18.47 kg/m. Physical Exam  GENERAL APPEARANCE: Alert,mod  conversant, No acute distress ; doing well now after her hip fx SKIN: No diaphoresis  rash HEENT: Unremarkable RESPIRATORY: Breathing is even, unlabored. Lung sounds are clear   CARDIOVASCULAR: Heart irreg no murmurs, rubs or gallops. No peripheral edema  GASTROINTESTINAL: Abdomen is soft, non-tender, not distended w/ normal bowel sounds.  GENITOURINARY: Bladder non tender, not distended  MUSCULOSKELETAL: No abnormal joints or musculature NEUROLOGIC: Cranial nerves 2-12 grossly intact. Moves all extremities PSYCHIATRIC: dementia, no behavioral issues  Patient Active Problem List   Diagnosis Date Noted  . Femoral neck fracture ([place]) 12/01/2015  . Malnutrition of moderate degree 12/01/2015  . Closed fracture of neck of left femur ([place])   . Mood disorder ([place]) 11/19/2015  . Dementia with behavioral disturbance 11/08/2015  . CKD (chronic kidney disease) stage 3, GFR 30-59 ml/min 09/17/2015  . Insomnia 08/18/2015  . Confusion 06/20/2015  . Recent urinary tract infection 06/20/2015  . Dysuria 04/10/2015  . Edema 04/06/2015  . Acute encephalopathy 03/29/2015  . Vitamin D deficiency 03/29/2015  . UTI (lower urinary tract infection) 03/21/2015  . Hip fracture ([place]) 03/20/2015  . Chronic atrial fibrillation ([place]) 02/20/2015  . Hyperlipidemia 02/20/2015  . Essential hypertension, benign 12/13/2012  . Cardiac arrhythmia 12/13/2012  . Urinary incontinence 12/13/2012  . Hypothyroidism 12/13/2012  . History of depression 12/11/2012    CMP     Component Value Date/Time   NA 139 01/10/2016 0030   NA 143 12/08/2015   K 3.9 01/10/2016 0030   CL 103 01/10/2016 0030   CO2 23 01/10/2016 0030   GLUCOSE 111 (H) 01/10/2016 0030   BUN 31 (H) 01/10/2016 0030   BUN 57 (A) 12/08/2015   CREATININE 1.26 (H) 01/10/2016 0030   CALCIUM 8.6 (L) 01/10/2016 0030   PROT 6.9 12/01/2015 0506   ALBUMIN 3.6 12/01/2015 0506   AST 24 12/01/2015 0506   ALT 16 12/01/2015 0506   ALKPHOS 75 12/01/2015 0506   BILITOT 0.6 12/01/2015 0506   GFRNONAA 36 (L) 01/10/2016 0030   GFRAA 41 (L)  01/10/2016 0030    Recent Labs  08/06/15 0220  12/01/15 0029 12/01/15 0506 12/08/15 01/10/16 0030  NA 133*  < > 141  141 141  141 143 139  K 3.6  < > 4.4  4.4 4.2 4.2 3.9  CL 97*  --  109 108  --  103  CO2 26  --   --  24  --  23  GLUCOSE 125*  --  111* 175*  --  111*  BUN 25*  < > 46*  46* 40*  40* 57* 31*  CREATININE 1.44*  < > 1.70*  1.7* 1.64*  1.6* 1.6* 1.26*  CALCIUM 8.7*  --   --  9.1  --  8.6*  < > =  values in this interval not displayed.  Recent Labs  02/20/15 1512 03/20/15 1700 12/01/15 0506  AST 18 27 24   ALT 10 15 16   ALKPHOS 67 51 75  BILITOT 0.4 0.8 0.6  PROT 6.7 6.4* 6.9  ALBUMIN 3.8 3.5 3.6    Recent Labs  12/01/15 0111 12/01/15 0506 12/02/15 12/02/15 0429 01/10/16 0030  WBC 10.0  10.0 12.4  12.4* 11.1 11.1* 7.6  NEUTROABS 7.8* 11.0*  --   --  5.4  HGB 11.2*  11.2* 11.9*  11.9*  --  11.6* 11.3*  HCT 35.1*  35* 36.6  37  --  35.9* 34.8*  MCV 90.2 89.3  --  88.9 88.8  PLT 242  242 248  248  --  227 306    Recent Labs  08/24/15  CHOL 107  LDLCALC 37  TRIG 77   No results found for: [facility] Lab Results  Component Value Date   TSH 0.45 01/16/2016   Lab Results  Component Value Date   HGBA1C 5.3 08/24/2015   Lab Results  Component Value Date   CHOL 107 08/24/2015   HDL 55 08/24/2015   LDLCALC 37 08/24/2015   TRIG 77 08/24/2015    Significant Diagnostic Results in last 30 days:  No results found.  Assessment and Plan  Chronic atrial fibrillation (HCC) No reported problems; cont tenormin 50 mg daily for rate and prophylaxis with eliquis 2.5 mg BID  Vitamin D deficiency Stable; plan to cont 3000 u daily replacement  Hypothyroidism Most recent TSH 0.45, just barely in normal range on synthroid 25 mg daily; plan to continue     Issachar Broady D. [name], MD

## 2016-02-08 ENCOUNTER — Non-Acute Institutional Stay (SKILLED_NURSING_FACILITY): Payer: Medicare Other | Admitting: Internal Medicine

## 2016-02-08 DIAGNOSIS — Z20828 Contact with and (suspected) exposure to other viral communicable diseases: Secondary | ICD-10-CM

## 2016-02-18 ENCOUNTER — Encounter: Payer: Self-pay | Admitting: Internal Medicine

## 2016-02-18 NOTE — Assessment & Plan Note (Signed)
No reported problems; cont tenormin 50 mg daily for rate and prophylaxis with eliquis 2.5 mg BID

## 2016-02-18 NOTE — Assessment & Plan Note (Signed)
Most recent TSH 0.45, just barely in normal range on synthroid 25 mg daily; plan to continue

## 2016-02-18 NOTE — Assessment & Plan Note (Addendum)
Stable; plan to cont 3000 u daily replacement

## 2016-02-20 DIAGNOSIS — E559 Vitamin D deficiency, unspecified: Secondary | ICD-10-CM | POA: Diagnosis not present

## 2016-02-20 DIAGNOSIS — I10 Essential (primary) hypertension: Secondary | ICD-10-CM | POA: Diagnosis not present

## 2016-02-20 DIAGNOSIS — E785 Hyperlipidemia, unspecified: Secondary | ICD-10-CM | POA: Diagnosis not present

## 2016-02-20 DIAGNOSIS — E119 Type 2 diabetes mellitus without complications: Secondary | ICD-10-CM | POA: Diagnosis not present

## 2016-02-20 LAB — TSH: TSH: 0.83 u[IU]/mL (ref 0.41–5.90)

## 2016-02-20 LAB — VITAMIN D 25 HYDROXY (VIT D DEFICIENCY, FRACTURES): Vit D, 25-Hydroxy: >60

## 2016-02-20 LAB — HEMOGLOBIN A1C: Hemoglobin A1C: 5.4

## 2016-02-20 LAB — HEPATIC FUNCTION PANEL
ALK PHOS: 101 U/L (ref 25–125)
ALT: 10 U/L (ref 7–35)
AST: 18 U/L (ref 13–35)
Bilirubin, Total: 0.5 mg/dL

## 2016-02-20 LAB — LIPID PANEL
Cholesterol: 136 mg/dL (ref 0–200)
HDL: 57 mg/dL (ref 35–70)
LDL CALC: 60 mg/dL
Triglycerides: 98 mg/dL (ref 40–160)

## 2016-02-20 LAB — BASIC METABOLIC PANEL
BUN: 33 mg/dL — AB (ref 4–21)
Creatinine: 1.1 mg/dL (ref 0.5–1.1)
GLUCOSE: 97 mg/dL
POTASSIUM: 4.7 mmol/L (ref 3.4–5.3)
Sodium: 141 mmol/L (ref 137–147)

## 2016-02-28 DIAGNOSIS — I10 Essential (primary) hypertension: Secondary | ICD-10-CM | POA: Diagnosis not present

## 2016-02-29 DIAGNOSIS — N390 Urinary tract infection, site not specified: Secondary | ICD-10-CM | POA: Diagnosis not present

## 2016-02-29 DIAGNOSIS — R319 Hematuria, unspecified: Secondary | ICD-10-CM | POA: Diagnosis not present

## 2016-03-05 ENCOUNTER — Encounter: Payer: Self-pay | Admitting: Internal Medicine

## 2016-03-05 ENCOUNTER — Non-Acute Institutional Stay (SKILLED_NURSING_FACILITY): Payer: Medicare Other | Admitting: Internal Medicine

## 2016-03-05 DIAGNOSIS — I10 Essential (primary) hypertension: Secondary | ICD-10-CM

## 2016-03-05 DIAGNOSIS — F39 Unspecified mood [affective] disorder: Secondary | ICD-10-CM

## 2016-03-05 DIAGNOSIS — E785 Hyperlipidemia, unspecified: Secondary | ICD-10-CM

## 2016-03-05 NOTE — Progress Notes (Signed)
Location:  [facility] Room Number: [phone] Place of Service:  SNF [phone])  Marie Robertson. Marie Coil, MD  Patient Care Team: Provider Not In System as PCP - General  Extended Emergency Contact Information Primary Emergency Contact: [facility] Address: [address]          [place], [place] [address] Johnnette Litter of [place] Phone: [phone] Relation: Son Secondary Emergency Contact: [place],  [address] Johnnette Litter of [place] Phone: [phone] Relation: Son    Allergies: Patient has no known allergies.  Chief Complaint  Patient presents with  . Medical Management of Chronic Issues    Routine Visit    HPI: Patient is 83 y.o. female who is being seen for routine issues of HTN, HLD and mood disorder.  Past Medical History:  Diagnosis Date  . Chronic atrial fibrillation ([place])   . Chronic renal insufficiency, stage III (moderate)   . Constipation   . CVA (cerebral infarction) approx 2012   residual defect: poor R sided peripheral vision per son  . Depression   . History of fracture of right hip 03/2015  . Hyperlipidemia   . Hypertension   . Hypothyroidism   . Insomnia   . Osteoarthritis    wrists, hands  . Osteoporosis     Past Surgical History:  Procedure Laterality Date  . APPENDECTOMY    . COLONOSCOPY  08/22/08  . INTRAMEDULLARY (IM) NAIL INTERTROCHANTERIC Right 03/22/2015   Procedure: RIGHT femoral HIP NAILING;  Surgeon: Gaynelle Arabian, MD;  Location: [facility];  Service: Orthopedics;  Laterality: Right;  . KNEE ARTHROSCOPY Right   . THYROID SURGERY      Allergies as of 03/05/2016   No Known Allergies     Medication List       Accurate as of 03/05/16 11:59 PM. Always use your most recent med list.          acetaminophen 500 MG tablet Commonly known as:  TYLENOL Take 2 tablets (1,000 mg total) by mouth every 6 (six) hours as needed for mild pain, moderate pain or headache.   atenolol 50 MG  tablet Commonly known as:  TENORMIN Take 50 mg by mouth daily.   ELIQUIS 2.5 MG Tabs tablet Generic drug:  apixaban Take 2.5 mg by mouth 2 (two) times daily. 9am and 5pm   feeding supplement Liqd Take 1 Container by mouth 3 (three) times daily between meals.   ENSURE Take 237 mLs by mouth 2 (two) times daily between meals. 10 am and 2 pm   levothyroxine 25 MCG tablet Commonly known as:  SYNTHROID, LEVOTHROID Take 25 mcg by mouth daily before breakfast.   ondansetron 4 MG disintegrating tablet Commonly known as:  ZOFRAN-ODT Take 4 mg by mouth every 8 (eight) hours as needed for nausea or vomiting.   pravastatin 40 MG tablet Commonly known as:  PRAVACHOL Take 1 tablet (40 mg total) by mouth at bedtime.   QUEtiapine 25 MG tablet Commonly known as:  SEROQUEL Take 25 mg by mouth daily.   QUEtiapine 50 MG tablet Commonly known as:  SEROQUEL Take 50 mg by mouth at bedtime.   senna 8.6 MG tablet Commonly known as:  SENOKOT Take 1 tablet by mouth at bedtime as needed for constipation.   tizanidine 2 MG capsule Commonly known as:  ZANAFLEX Take 2 mg by mouth every 8 (eight) hours.   Vitamin D-3 1000 units Caps Take 3,000 Units  by mouth daily.       No orders of the defined types were placed in this encounter.   Immunization History  Administered Date(s) Administered  . Influenza Split 11/20/2012  . Influenza-Unspecified 10/07/2015  . PPD Test 08/14/2015  . Pneumococcal Conjugate-13 05/22/2015  . Pneumococcal Polysaccharide-23 08/22/2008  . Tdap 01/01/2015, 08/06/2015, 01/10/2016    Social History  Substance Use Topics  . Smoking status: Never Smoker  . Smokeless tobacco: Never Used  . Alcohol use No    Review of Systems  UTO 2/2 dementia; nursing without concerns    Vitals:   03/05/16 1028  BP: 126/82  Pulse: 64  Resp: 17  Temp: (!) 96.6 F (35.9 C)   Body mass index is 17.71 kg/m. Physical Exam  GENERAL APPEARANCE: Alert, conversant, No acute  distress  SKIN: No diaphoresis rash HEENT: Unremarkable RESPIRATORY: Breathing is even, unlabored. Lung sounds are clear   CARDIOVASCULAR: Heart RRR no murmurs, rubs or gallops. No peripheral edema  GASTROINTESTINAL: Abdomen is soft, non-tender, not distended w/ normal bowel sounds.  GENITOURINARY: Bladder non tender, not distended  MUSCULOSKELETAL: No abnormal joints or musculature NEUROLOGIC: Cranial nerves 2-12 grossly intact. Moves all extremities PSYCHIATRIC: dementia, no behavioral issues  Patient Active Problem List   Diagnosis Date Noted  . Femoral neck fracture ([place]) 12/01/2015  . Malnutrition of moderate degree 12/01/2015  . Closed fracture of neck of left femur ([place])   . Mood disorder ([place]) 11/19/2015  . Dementia with behavioral disturbance 11/08/2015  . CKD (chronic kidney disease) stage 3, GFR 30-59 ml/min 09/17/2015  . Insomnia 08/18/2015  . Confusion 06/20/2015  . Recent urinary tract infection 06/20/2015  . Dysuria 04/10/2015  . Edema 04/06/2015  . Acute encephalopathy 03/29/2015  . Vitamin D deficiency 03/29/2015  . UTI (lower urinary tract infection) 03/21/2015  . Hip fracture ([place]) 03/20/2015  . Chronic atrial fibrillation ([place]) 02/20/2015  . Hyperlipidemia 02/20/2015  . Essential hypertension, benign 12/13/2012  . Cardiac arrhythmia 12/13/2012  . Urinary incontinence 12/13/2012  . Hypothyroidism 12/13/2012  . History of depression 12/11/2012    CMP     Component Value Date/Time   NA 141 02/20/2016   K 4.7 02/20/2016   CL 103 01/10/2016 0030   CO2 23 01/10/2016 0030   GLUCOSE 111 (H) 01/10/2016 0030   BUN 33 (A) 02/20/2016   CREATININE 1.1 02/20/2016   CREATININE 1.26 (H) 01/10/2016 0030   CALCIUM 8.6 (L) 01/10/2016 0030   PROT 6.9 12/01/2015 0506   ALBUMIN 3.6 12/01/2015 0506   AST 18 02/20/2016   ALT 10 02/20/2016   ALKPHOS 101 02/20/2016   BILITOT 0.6 12/01/2015 0506   GFRNONAA 36 (L) 01/10/2016 0030   GFRAA 41 (L) 01/10/2016 0030     Recent Labs  08/06/15 0220  12/01/15 0029 12/01/15 0506 12/08/15 01/10/16 0030 02/20/16  NA 133*  < > 141  141 141  141 143 139 141  K 3.6  < > 4.4  4.4 4.2 4.2 3.9 4.7  CL 97*  --  109 108  --  103  --   CO2 26  --   --  24  --  23  --   GLUCOSE 125*  --  111* 175*  --  111*  --   BUN 25*  < > 46*  46* 40*  40* 57* 31* 33*  CREATININE 1.44*  < > 1.70*  1.7* 1.64*  1.6* 1.6* 1.26* 1.1  CALCIUM 8.7*  --   --  9.1  --  8.6*  --   < > = values in this interval not displayed.  Recent Labs  03/20/15 1700 12/01/15 0506 02/20/16  AST 27 24 18   ALT 15 16 10   ALKPHOS 51 75 101  BILITOT 0.8 0.6  --   PROT 6.4* 6.9  --   ALBUMIN 3.5 3.6  --     Recent Labs  12/01/15 0111 12/01/15 0506 12/02/15 12/02/15 0429 01/10/16 0030  WBC 10.0  10.0 12.4  12.4* 11.1 11.1* 7.6  NEUTROABS 7.8* 11.0*  --   --  5.4  HGB 11.2*  11.2* 11.9*  11.9*  --  11.6* 11.3*  HCT 35.1*  35* 36.6  37  --  35.9* 34.8*  MCV 90.2 89.3  --  88.9 88.8  PLT 242  242 248  248  --  227 306    Recent Labs  08/24/15 02/20/16  CHOL 107 136  LDLCALC 37 60  TRIG 77 98   No results found for: [facility] Lab Results  Component Value Date   TSH 0.83 02/20/2016   Lab Results  Component Value Date   HGBA1C 5.4 02/20/2016   Lab Results  Component Value Date   CHOL 136 02/20/2016   HDL 57 02/20/2016   LDLCALC 60 02/20/2016   TRIG 98 02/20/2016    Significant Diagnostic Results in last 30 days:  No results found.  Assessment and Plan  Essential hypertension, benign Well controlled on tenormin 50 mg daily  Hyperlipidemia Good control ; 02/2016  LDL 60  HDL 57; cont pravachol 40 mg daily  Mood disorder (HCC) Stable;cont seroquel 50 mg qHS    Marie Mancebo D. Marie Coil, MD

## 2016-03-08 DIAGNOSIS — S72002D Fracture of unspecified part of neck of left femur, subsequent encounter for closed fracture with routine healing: Secondary | ICD-10-CM | POA: Diagnosis not present

## 2016-03-17 ENCOUNTER — Encounter: Payer: Self-pay | Admitting: Internal Medicine

## 2016-03-17 NOTE — Assessment & Plan Note (Signed)
Stable;cont seroquel 50 mg qHS

## 2016-03-17 NOTE — Assessment & Plan Note (Signed)
Well controlled on tenormin 50 mg daily

## 2016-03-17 NOTE — Assessment & Plan Note (Signed)
Good control ; 02/2016  LDL 60  HDL 57; cont pravachol 40 mg daily

## 2016-03-18 DIAGNOSIS — F29 Unspecified psychosis not due to a substance or known physiological condition: Secondary | ICD-10-CM | POA: Diagnosis not present

## 2016-03-18 DIAGNOSIS — G4700 Insomnia, unspecified: Secondary | ICD-10-CM | POA: Diagnosis not present

## 2016-03-18 DIAGNOSIS — F419 Anxiety disorder, unspecified: Secondary | ICD-10-CM | POA: Diagnosis not present

## 2016-03-20 NOTE — Progress Notes (Signed)
Location:  [facility] Room Number: [phone] Place of Service:  SNF [phone])  Marie Delaine.[name], MD  Patient Care Team: Provider Not In System as PCP - General  Extended Emergency Contact Information Primary Emergency Contact: [facility] Address: [address]          [place], [place] [address] Johnnette Litter of [place] Phone: [phone] Relation: Son Secondary Emergency Contact: [place], [place] [address] Johnnette Litter of [place] Phone: [phone] Relation: Son    Allergies: Patient has no known allergies.  Chief Complaint  Patient presents with  . Acute Visit    HPI: Patient is 83 y.o. female who is being seen acutely because an outbreak of Influenza A per CDC guidelines was recognized on 02/07/2016. Pt has no c/o flu like symptoms;therefore pt will need to be prophylaxed with Tamiflu for a minimum of 14 days per CDC protocol.  Past Medical History:  Diagnosis Date  . Chronic atrial fibrillation ([place])   . Chronic renal insufficiency, stage III (moderate)   . Constipation   . CVA (cerebral infarction) approx 2012   residual defect: poor R sided peripheral vision per son  . Depression   . History of fracture of right hip 03/2015  . Hyperlipidemia   . Hypertension   . Hypothyroidism   . Insomnia   . Osteoarthritis    wrists, hands  . Osteoporosis     Past Surgical History:  Procedure Laterality Date  . APPENDECTOMY    . COLONOSCOPY  08/22/08  . INTRAMEDULLARY (IM) NAIL INTERTROCHANTERIC Right 03/22/2015   Procedure: RIGHT femoral HIP NAILING;  Surgeon: Gaynelle Arabian, MD;  Location: [facility];  Service: Orthopedics;  Laterality: Right;  . KNEE ARTHROSCOPY Right   . THYROID SURGERY      Allergies as of 02/08/2016   No Known Allergies     Medication List       Accurate as of 02/08/16 11:59 PM. Always use your most recent med list.          acetaminophen 500 MG tablet Commonly known as:  TYLENOL Take 2 tablets  (1,000 mg total) by mouth every 6 (six) hours as needed for mild pain, moderate pain or headache.   atenolol 50 MG tablet Commonly known as:  TENORMIN Take 50 mg by mouth daily.   ELIQUIS 2.5 MG Tabs tablet Generic drug:  apixaban Take 2.5 mg by mouth 2 (two) times daily. 9am and 5pm   feeding supplement Liqd Take 1 Container by mouth 3 (three) times daily between meals.   levothyroxine 25 MCG tablet Commonly known as:  SYNTHROID, LEVOTHROID Take 25 mcg by mouth daily before breakfast.   ondansetron 4 MG disintegrating tablet Commonly known as:  ZOFRAN-ODT Take 4 mg by mouth every 8 (eight) hours as needed for nausea or vomiting.   pravastatin 40 MG tablet Commonly known as:  PRAVACHOL Take 1 tablet (40 mg total) by mouth at bedtime.   QUEtiapine 25 MG tablet Commonly known as:  SEROQUEL Take 25 mg by mouth daily.   QUEtiapine 50 MG tablet Commonly known as:  SEROQUEL Take 50 mg by mouth at bedtime.   senna 8.6 MG tablet Commonly known as:  SENOKOT Take 1 tablet by mouth at bedtime as needed for constipation.   tizanidine 2 MG capsule Commonly known as:  ZANAFLEX Take 2 mg by mouth every 8 (eight) hours.       No orders of  the defined types were placed in this encounter.   Immunization History  Administered Date(s) Administered  . Influenza Split 11/20/2012  . Influenza-Unspecified 10/07/2015  . PPD Test 08/14/2015  . Pneumococcal Conjugate-13 05/22/2015  . Pneumococcal Polysaccharide-23 08/22/2008  . Tdap 01/01/2015, 08/06/2015, 01/10/2016    Social History  Substance Use Topics  . Smoking status: Never Smoker  . Smokeless tobacco: Never Used  . Alcohol use No    Review of Systems  DATA OBTAINED: from patient, nurse GENERAL:  no fevers SKIN: No itching, rash HEENT: no rhinorrhea, congestion, ST or ear pain RESPIRATORY: No cough, wheezing, SOB CARDIAC: No chest pain, palpitations, lower extremity edema  GI: No abdominal pain, No N/V/D or  constipation, No heartburn or reflux  MUSCULOSKELETAL: No muscle aches NEUROLOGIC: No headache, dizziness   Vitals:   02/08/16 1602  BP: (!) 142/87  Pulse: 80  Resp: 18  Temp: 99.3 F (37.4 C)   Body mass index is 17.71 kg/m. Physical Exam  GENERAL APPEARANCE: Alert, conversant, No acute distress  SKIN: No diaphoresis rash HEENT: Unremarkable RESPIRATORY: Breathing is even, unlabored. Lung sounds are clear   CARDIOVASCULAR: Heart RRR no murmurs, rubs or gallops. No peripheral edema  GASTROINTESTINAL: Abdomen is soft, non-tender, not distended w/ normal bowel sounds.   NEUROLOGIC: Cranial nerves 2-12 grossly intact PSYCHIATRIC: baseline, no mental status changes  Patient Active Problem List   Diagnosis Date Noted  . Femoral neck fracture ([place]) 12/01/2015  . Malnutrition of moderate degree 12/01/2015  . Closed fracture of neck of left femur ([place])   . Mood disorder ([place]) 11/19/2015  . Dementia with behavioral disturbance 11/08/2015  . CKD (chronic kidney disease) stage 3, GFR 30-59 ml/min 09/17/2015  . Insomnia 08/18/2015  . Confusion 06/20/2015  . Recent urinary tract infection 06/20/2015  . Dysuria 04/10/2015  . Edema 04/06/2015  . Acute encephalopathy 03/29/2015  . Vitamin D deficiency 03/29/2015  . UTI (lower urinary tract infection) 03/21/2015  . Hip fracture ([place]) 03/20/2015  . Chronic atrial fibrillation ([place]) 02/20/2015  . Hyperlipidemia 02/20/2015  . Essential hypertension, benign 12/13/2012  . Cardiac arrhythmia 12/13/2012  . Urinary incontinence 12/13/2012  . Hypothyroidism 12/13/2012  . History of depression 12/11/2012    CMP     Component Value Date/Time   NA 141 02/20/2016   K 4.7 02/20/2016   CL 103 01/10/2016 0030   CO2 23 01/10/2016 0030   GLUCOSE 111 (H) 01/10/2016 0030   BUN 33 (A) 02/20/2016   CREATININE 1.1 02/20/2016   CREATININE 1.26 (H) 01/10/2016 0030   CALCIUM 8.6 (L) 01/10/2016 0030   PROT 6.9 12/01/2015 0506   ALBUMIN 3.6  12/01/2015 0506   AST 18 02/20/2016   ALT 10 02/20/2016   ALKPHOS 101 02/20/2016   BILITOT 0.6 12/01/2015 0506   GFRNONAA 36 (L) 01/10/2016 0030   GFRAA 41 (L) 01/10/2016 0030    Recent Labs  08/06/15 0220  12/01/15 0029 12/01/15 0506 12/08/15 01/10/16 0030 02/20/16  NA 133*  < > 141  141 141  141 143 139 141  K 3.6  < > 4.4  4.4 4.2 4.2 3.9 4.7  CL 97*  --  109 108  --  103  --   CO2 26  --   --  24  --  23  --   GLUCOSE 125*  --  111* 175*  --  111*  --   BUN 25*  < > 46*  46* 40*  40* 57* 31* 33*  CREATININE 1.44*  < >  1.70*  1.7* 1.64*  1.6* 1.6* 1.26* 1.1  CALCIUM 8.7*  --   --  9.1  --  8.6*  --   < > = values in this interval not displayed.  Recent Labs  12/01/15 0506 02/20/16  AST 24 18  ALT 16 10  ALKPHOS 75 101  BILITOT 0.6  --   PROT 6.9  --   ALBUMIN 3.6  --     Recent Labs  12/01/15 0111 12/01/15 0506 12/02/15 12/02/15 0429 01/10/16 0030  WBC 10.0  10.0 12.4  12.4* 11.1 11.1* 7.6  NEUTROABS 7.8* 11.0*  --   --  5.4  HGB 11.2*  11.2* 11.9*  11.9*  --  11.6* 11.3*  HCT 35.1*  35* 36.6  37  --  35.9* 34.8*  MCV 90.2 89.3  --  88.9 88.8  PLT 242  242 248  248  --  227 306    Recent Labs  08/24/15 02/20/16  CHOL 107 136  LDLCALC 37 60  TRIG 77 98   No results found for: [facility] Lab Results  Component Value Date   TSH 0.83 02/20/2016   Lab Results  Component Value Date   HGBA1C 5.4 02/20/2016   Lab Results  Component Value Date   CHOL 136 02/20/2016   HDL 57 02/20/2016   LDLCALC 60 02/20/2016   TRIG 98 02/20/2016    Significant Diagnostic Results in last 30 days:  No results found.  Assessment and Plan  EXPOSURE TO FLU/ INFLUENZA OUTBREAK AT SNF-   CrCl calculated by me-  17.4       Dose for 14 days- 30 mg q 48 hr Pt will be monitored daily for flu like symptoms                                                                                 Glenetta Kiger D.[name], MD

## 2016-03-31 ENCOUNTER — Encounter: Payer: Self-pay | Admitting: Internal Medicine

## 2016-04-03 ENCOUNTER — Encounter: Payer: Self-pay | Admitting: Internal Medicine

## 2016-04-03 ENCOUNTER — Non-Acute Institutional Stay (SKILLED_NURSING_FACILITY): Payer: Medicare Other | Admitting: Internal Medicine

## 2016-04-03 DIAGNOSIS — F0281 Dementia in other diseases classified elsewhere with behavioral disturbance: Secondary | ICD-10-CM

## 2016-04-03 DIAGNOSIS — N183 Chronic kidney disease, stage 3 unspecified: Secondary | ICD-10-CM

## 2016-04-03 DIAGNOSIS — F39 Unspecified mood [affective] disorder: Secondary | ICD-10-CM

## 2016-04-03 DIAGNOSIS — G301 Alzheimer's disease with late onset: Secondary | ICD-10-CM

## 2016-04-03 DIAGNOSIS — F02818 Dementia in other diseases classified elsewhere, unspecified severity, with other behavioral disturbance: Secondary | ICD-10-CM

## 2016-04-03 NOTE — Progress Notes (Signed)
Location:  [place] Room Number: [phone] Place of Service:  SNF [phone])  Inocencio Homes, MD  Patient Care Team: [name], MD as PCP - General (Internal Medicine)  Extended Emergency Contact Information Primary Emergency Contact: [facility] Address: [address]          [place], [place] [address] Johnnette Litter of [place] Phone: [phone] Relation: Son Secondary Emergency Contact: [place], [place] [address] Johnnette Litter of [place] Phone: [phone] Relation: Son    Allergies: Patient has no known allergies.  Chief Complaint  Patient presents with  . Medical Management of Chronic Issues    Routine Visit    HPI: Patient is 83 y.o. female who is being seen for routine issues of CKD3, dementia and mood disorder.  Past Medical History:  Diagnosis Date  . Chronic atrial fibrillation ([place])   . Chronic renal insufficiency, stage III (moderate)   . Constipation   . CVA (cerebral infarction) approx 2012   residual defect: poor R sided peripheral vision per son  . Depression   . History of fracture of right hip 03/2015  . Hyperlipidemia   . Hypertension   . Hypothyroidism   . Insomnia   . Osteoarthritis    wrists, hands  . Osteoporosis     Past Surgical History:  Procedure Laterality Date  . APPENDECTOMY    . COLONOSCOPY  08/22/08  . INTRAMEDULLARY (IM) NAIL INTERTROCHANTERIC Right 03/22/2015   Procedure: RIGHT femoral HIP NAILING;  Surgeon: Gaynelle Arabian, MD;  Location: [facility];  Service: Orthopedics;  Laterality: Right;  . KNEE ARTHROSCOPY Right   . THYROID SURGERY      Allergies as of 04/03/2016   No Known Allergies     Medication List       Accurate as of 04/03/16 11:59 PM. Always use your most recent med list.          acetaminophen 500 MG tablet Commonly known as:  TYLENOL Take 2 tablets (1,000 mg total) by mouth every 6 (six) hours as needed for mild pain, moderate pain or headache.     atenolol 50 MG tablet Commonly known as:  TENORMIN Take 50 mg by mouth daily.   ELIQUIS 2.5 MG Tabs tablet Generic drug:  apixaban Take 2.5 mg by mouth 2 (two) times daily. 9am and 5pm   feeding supplement Liqd Take 1 Container by mouth 3 (three) times daily between meals.   levothyroxine 25 MCG tablet Commonly known as:  SYNTHROID, LEVOTHROID Take 25 mcg by mouth daily before breakfast.   ondansetron 4 MG disintegrating tablet Commonly known as:  ZOFRAN-ODT Take 4 mg by mouth every 8 (eight) hours as needed for nausea or vomiting.   pravastatin 40 MG tablet Commonly known as:  PRAVACHOL Take 1 tablet (40 mg total) by mouth at bedtime.   QUEtiapine 25 MG tablet Commonly known as:  SEROQUEL Take 25 mg by mouth daily.   QUEtiapine 50 MG tablet Commonly known as:  SEROQUEL Take 50 mg by mouth at bedtime.   senna 8.6 MG tablet Commonly known as:  SENOKOT Take 1 tablet by mouth at bedtime as needed for constipation.   tizanidine 2 MG capsule Commonly known as:  ZANAFLEX Take 2 mg by mouth every 8 (eight) hours.       No orders of the defined types were placed in this encounter.   Immunization History  Administered Date(s) Administered  .  Influenza Split 11/20/2012  . Influenza-Unspecified 10/07/2015  . PPD Test 08/14/2015  . Pneumococcal Conjugate-13 05/22/2015  . Pneumococcal Polysaccharide-23 08/22/2008  . Tdap 01/01/2015, 08/06/2015, 01/10/2016    Social History  Substance Use Topics  . Smoking status: Never Smoker  . Smokeless tobacco: Never Used  . Alcohol use No    Review of Systems -  UTO 2/2 dementia; nursing without concerns     Vitals:   04/03/16 1100  BP: 128/86  Pulse: 78  Resp: 16  Temp: 98.4 F (36.9 C)   Body mass index is 18.57 kg/m. Physical Exam  GENERAL APPEARANCE: Alert, No acute distress  SKIN: No diaphoresis rash HEENT: Unremarkable RESPIRATORY: Breathing is even, unlabored. Lung sounds are clear   CARDIOVASCULAR:  Heart RRR no murmurs, rubs or gallops. No peripheral edema  GASTROINTESTINAL: Abdomen is soft, non-tender, not distended w/ normal bowel sounds.  GENITOURINARY: Bladder non tender, not distended  MUSCULOSKELETAL: No abnormal joints or musculature NEUROLOGIC: Cranial nerves 2-12 grossly intact. Moves all extremities PSYCHIATRIC: Mood and affect with dementia, no behavioral issues  Patient Active Problem List   Diagnosis Date Noted  . Femoral neck fracture ([place]) 12/01/2015  . Malnutrition of moderate degree 12/01/2015  . Closed fracture of neck of left femur ([place])   . Mood disorder ([place]) 11/19/2015  . Dementia with behavioral disturbance 11/08/2015  . CKD (chronic kidney disease) stage 3, GFR 30-59 ml/min 09/17/2015  . Insomnia 08/18/2015  . Confusion 06/20/2015  . Recent urinary tract infection 06/20/2015  . Dysuria 04/10/2015  . Edema 04/06/2015  . Acute encephalopathy 03/29/2015  . Vitamin D deficiency 03/29/2015  . UTI (lower urinary tract infection) 03/21/2015  . Hip fracture ([place]) 03/20/2015  . Chronic atrial fibrillation ([place]) 02/20/2015  . Hyperlipidemia 02/20/2015  . Essential hypertension, benign 12/13/2012  . Cardiac arrhythmia 12/13/2012  . Urinary incontinence 12/13/2012  . Hypothyroidism 12/13/2012  . History of depression 12/11/2012    CMP     Component Value Date/Time   NA 141 02/20/2016   K 4.7 02/20/2016   CL 103 01/10/2016 0030   CO2 23 01/10/2016 0030   GLUCOSE 111 (H) 01/10/2016 0030   BUN 33 (A) 02/20/2016   CREATININE 1.1 02/20/2016   CREATININE 1.26 (H) 01/10/2016 0030   CALCIUM 8.6 (L) 01/10/2016 0030   PROT 6.9 12/01/2015 0506   ALBUMIN 3.6 12/01/2015 0506   AST 18 02/20/2016   ALT 10 02/20/2016   ALKPHOS 101 02/20/2016   BILITOT 0.6 12/01/2015 0506   GFRNONAA 36 (L) 01/10/2016 0030   GFRAA 41 (L) 01/10/2016 0030    Recent Labs  08/06/15 0220  12/01/15 0029 12/01/15 0506 12/08/15 01/10/16 0030 02/20/16  NA 133*  < > 141  141 141   141 143 139 141  K 3.6  < > 4.4  4.4 4.2 4.2 3.9 4.7  CL 97*  --  109 108  --  103  --   CO2 26  --   --  24  --  23  --   GLUCOSE 125*  --  111* 175*  --  111*  --   BUN 25*  < > 46*  46* 40*  40* 57* 31* 33*  CREATININE 1.44*  < > 1.70*  1.7* 1.64*  1.6* 1.6* 1.26* 1.1  CALCIUM 8.7*  --   --  9.1  --  8.6*  --   < > = values in this interval not displayed.  Recent Labs  12/01/15 0506 02/20/16  AST 24  18  ALT 16 10  ALKPHOS 75 101  BILITOT 0.6  --   PROT 6.9  --   ALBUMIN 3.6  --     Recent Labs  12/01/15 0111 12/01/15 0506 12/02/15 12/02/15 0429 01/10/16 0030  WBC 10.0  10.0 12.4  12.4* 11.1 11.1* 7.6  NEUTROABS 7.8* 11.0*  --   --  5.4  HGB 11.2*  11.2* 11.9*  11.9*  --  11.6* 11.3*  HCT 35.1*  35* 36.6  37  --  35.9* 34.8*  MCV 90.2 89.3  --  88.9 88.8  PLT 242  242 248  248  --  227 306    Recent Labs  08/24/15 02/20/16  CHOL 107 136  LDLCALC 37 60  TRIG 77 98   No results found for: [facility] Lab Results  Component Value Date   TSH 0.83 02/20/2016   Lab Results  Component Value Date   HGBA1C 5.4 02/20/2016   Lab Results  Component Value Date   CHOL 136 02/20/2016   HDL 57 02/20/2016   LDLCALC 60 02/20/2016   TRIG 98 02/20/2016    Significant Diagnostic Results in last 30 days:  Dg Op Swallowing Func-medicare/speech Path  Result Date: 04/18/2016 Objective Swallowing Evaluation: Type of Study: MBS-Modified Barium Swallow Study Patient Details Name: Taima Rada MRN: [id number] Date of Birth: 1920-12-30 Today's Date: 04/18/2016 Time: SLP Start Time (ACUTE ONLY): 1110-SLP Stop Time (ACUTE ONLY): 1144 SLP Time Calculation (min) (ACUTE ONLY): 34 min Past Medical History: Past Medical History: Diagnosis Date . Chronic atrial fibrillation ([place])  . Chronic renal insufficiency, stage III (moderate)  . Constipation  . CVA (cerebral infarction) approx 2012  residual defect: poor R sided peripheral vision per son . Depression  . History of fracture  of right hip 03/2015 . Hyperlipidemia  . Hypertension  . Hypothyroidism  . Insomnia  . Osteoarthritis   wrists, hands . Osteoporosis  Past Surgical History: Past Surgical History: Procedure Laterality Date . APPENDECTOMY   . COLONOSCOPY  08/22/08 . INTRAMEDULLARY (IM) NAIL INTERTROCHANTERIC Right 03/22/2015  Procedure: RIGHT femoral HIP NAILING;  Surgeon: Gaynelle Arabian, MD;  Location: [facility];  Service: Orthopedics;  Laterality: Right; . KNEE ARTHROSCOPY Right  . THYROID SURGERY   HPI: Patient is a 83 y.o female who presents for outpatient MBS from [facility] PMH significant for chronic afib, renal insufficiency (stage III), CVA, depression, hx of right hip fracture, HTN, HLD and mood disorder. No Data Recorded Assessment / Plan / Recommendation CHL IP CLINICAL IMPRESSIONS 04/18/2016 Clinical Impression Pt demonstrates a mild oral dysphagia with residuals on the base of tongue that pt generally transits with a second swallow, though not always immediately or consistently, with spill to the valleculae with all textures. Oropharyngeal dysphagia characterized by frank  silent penetration during the swallow with thin liquids only (pt did not penetrate nectar, had accumulation of penetrate prior to nectar being given). Impairment related to slight delay in laryngeal elevation and closure of laryngeal vestibule as pt transits bolus. A chin tuck was not effective in preventing penetration. Mobility and strength of musculature generally WFL though not all of penetrate is ejected  during the swallow and it accumulated over several swallows. A cued throat clear and second swallow ejected the penetrate effectively. Esophageal sweep with puree and barium tablet showed no significant stasis, though esopahgus did appear torturous. Defer diet recommendation and appropriate strategies to primary SLP.  SLP Visit Diagnosis Dysphagia, oropharyngeal phase (R13.12) Attention and concentration deficit following --  Frontal  lobe and executive function deficit following -- Impact on safety and function Mild aspiration risk   CHL IP TREATMENT RECOMMENDATION 04/18/2016 Treatment Recommendations Defer treatment plan to f/u with SLP   No flowsheet data found. CHL IP DIET RECOMMENDATION 04/18/2016 SLP Diet Recommendations (No Data) Liquid Administration via -- Medication Administration Whole meds with puree Compensations -- Postural Changes --   CHL IP OTHER RECOMMENDATIONS 04/18/2016 Recommended Consults -- Oral Care Recommendations Oral care BID Other Recommendations --   No flowsheet data found.  No flowsheet data found.     CHL IP ORAL PHASE 04/18/2016 Oral Phase Impaired Oral - Pudding Teaspoon -- Oral - Pudding Cup -- Oral - Honey Teaspoon -- Oral - Honey Cup -- Oral - Nectar Teaspoon -- Oral - Nectar Cup Decreased bolus cohesion;Lingual/palatal residue Oral - Nectar Straw Decreased bolus cohesion;Lingual/palatal residue Oral - Thin Teaspoon -- Oral - Thin Cup Decreased bolus cohesion;Lingual/palatal residue Oral - Thin Straw Decreased bolus cohesion;Lingual/palatal residue Oral - Puree Decreased bolus cohesion;Lingual/palatal residue Oral - Mech Soft -- Oral - Regular Decreased bolus cohesion;Lingual/palatal residue Oral - Multi-Consistency -- Oral - Pill Decreased bolus cohesion;Lingual/palatal residue Oral Phase - Comment --  CHL IP PHARYNGEAL PHASE 04/18/2016 Pharyngeal Phase Impaired Pharyngeal- Pudding Teaspoon -- Pharyngeal -- Pharyngeal- Pudding Cup -- Pharyngeal -- Pharyngeal- Honey Teaspoon -- Pharyngeal -- Pharyngeal- Honey Cup -- Pharyngeal -- Pharyngeal- Nectar Teaspoon -- Pharyngeal -- Pharyngeal- Nectar Cup WFL Pharyngeal -- Pharyngeal- Nectar Straw WFL Pharyngeal -- Pharyngeal- Thin Teaspoon -- Pharyngeal -- Pharyngeal- Thin Cup Penetration/Aspiration before swallow;Penetration/Aspiration during swallow Pharyngeal Material enters airway, CONTACTS cords and not ejected out;Material enters airway, remains ABOVE vocal cords and  not ejected out Pharyngeal- Thin Straw Penetration/Aspiration before swallow;Penetration/Aspiration during swallow Pharyngeal Material enters airway, CONTACTS cords and not ejected out;Material enters airway, remains ABOVE vocal cords and not ejected out Pharyngeal- Puree WFL Pharyngeal -- Pharyngeal- Mechanical Soft -- Pharyngeal -- Pharyngeal- Regular WFL Pharyngeal -- Pharyngeal- Multi-consistency -- Pharyngeal -- Pharyngeal- Pill WFL;Other (Comment) Pharyngeal -- Pharyngeal Comment --  CHL IP CERVICAL ESOPHAGEAL PHASE 04/18/2016 Cervical Esophageal Phase WFL Pudding Teaspoon -- Pudding Cup -- Honey Teaspoon -- Honey Cup -- Nectar Teaspoon -- Nectar Cup -- Nectar Straw -- Thin Teaspoon -- Thin Cup -- Thin Straw -- Puree -- Mechanical Soft -- Regular -- Multi-consistency -- Pill -- Cervical Esophageal Comment -- CHL IP GO 04/18/2016 Functional Assessment Tool Used clinical judgement Functional Limitations Swallowing Swallow Current Status (T2458) CJ Swallow Goal Status (K9983) CJ Swallow Discharge Status (J8250) CJ Motor Speech Current Status (N3976) (None) Motor Speech Goal Status (B3419) (None) Motor Speech Goal Status (F7902) (None) Spoken Language Comprehension Current Status (I0973) (None) Spoken Language Comprehension Goal Status (Z3299) (None) Spoken Language Comprehension Discharge Status (M4268) (None) Spoken Language Expression Current Status (T4196) (None) Spoken Language Expression Goal Status (Q2297) (None) Spoken Language Expression Discharge Status (L8921) (None) Attention Current Status (J9417) (None) Attention Goal Status (E0814) (None) Attention Discharge Status (G8185) (None) Memory Current Status (U3149) (None) Memory Goal Status (F0263) (None) Memory Discharge Status (Z8588) (None) Voice Current Status (F0277) (None) Voice Goal Status (A1287) (None) Voice Discharge Status (O6767) (None) Other Speech-Language Pathology Functional Limitation Current Status (M0947) (None) Other Speech-Language  Pathology Functional Limitation Goal Status (S9628) (None) Other Speech-Language Pathology Functional Limitation Discharge Status [id number]) (None) DeBlois, Katherene Ponto 04/18/2016, 12:28 PM            CLINICAL DATA:  Stroke, dysphagia.  Dementia. EXAM: MODIFIED BARIUM SWALLOW TECHNIQUE: Different consistencies of barium were administered orally  to the patient by the Speech Pathologist. Imaging of the pharynx was performed in the lateral projection. FLUOROSCOPY TIME:  Fluoroscopy Time:  3 minutes, 14 seconds Radiation Exposure Index (if provided by the fluoroscopic device): Number of Acquired Spot Images: 0 COMPARISON:  None. FINDINGS: Thin liquid- penetration to the cords multiple episodes, with and without chin-tuck. Mild retention. Nectar thick liquid- penetration on 1 out of 3 swallows. Mild retention. Pure- retention on 1 of 3 swallows. Pure with cracker- within normal limits Barium tablet -  within normal limits IMPRESSION: 1. Silent penetration to the level of the cords with thin liquids and rarely with nectar thick liquid. Mild vallecular retention. Please refer to the Speech Pathologists report for complete details and recommendations. Electronically Signed   By: Van Clines M.D.   On: 04/18/2016 12:07    Assessment and Plan  CKD (chronic kidney disease) stage 3, GFR 30-59 ml/min 33/1.1, improved from prior; will monitor at intervals  Dementia with behavioral disturbance Stable and chronic without major decline; cont supportive care  Mood disorder (HCC) Improved on seroquel 25 mg daily and 50 mg qHS; cont current regimen    Jadore Mcguffin D. [name], MD

## 2016-04-10 DIAGNOSIS — I482 Chronic atrial fibrillation: Secondary | ICD-10-CM | POA: Diagnosis not present

## 2016-04-10 DIAGNOSIS — R1312 Dysphagia, oropharyngeal phase: Secondary | ICD-10-CM | POA: Diagnosis not present

## 2016-04-11 DIAGNOSIS — R1312 Dysphagia, oropharyngeal phase: Secondary | ICD-10-CM | POA: Diagnosis not present

## 2016-04-11 DIAGNOSIS — I482 Chronic atrial fibrillation: Secondary | ICD-10-CM | POA: Diagnosis not present

## 2016-04-12 DIAGNOSIS — I482 Chronic atrial fibrillation: Secondary | ICD-10-CM | POA: Diagnosis not present

## 2016-04-12 DIAGNOSIS — R1312 Dysphagia, oropharyngeal phase: Secondary | ICD-10-CM | POA: Diagnosis not present

## 2016-04-14 DIAGNOSIS — I482 Chronic atrial fibrillation: Secondary | ICD-10-CM | POA: Diagnosis not present

## 2016-04-14 DIAGNOSIS — R1312 Dysphagia, oropharyngeal phase: Secondary | ICD-10-CM | POA: Diagnosis not present

## 2016-04-15 DIAGNOSIS — G4700 Insomnia, unspecified: Secondary | ICD-10-CM | POA: Diagnosis not present

## 2016-04-15 DIAGNOSIS — R4589 Other symptoms and signs involving emotional state: Secondary | ICD-10-CM | POA: Diagnosis not present

## 2016-04-15 DIAGNOSIS — F22 Delusional disorders: Secondary | ICD-10-CM | POA: Diagnosis not present

## 2016-04-15 DIAGNOSIS — I482 Chronic atrial fibrillation: Secondary | ICD-10-CM | POA: Diagnosis not present

## 2016-04-15 DIAGNOSIS — R1312 Dysphagia, oropharyngeal phase: Secondary | ICD-10-CM | POA: Diagnosis not present

## 2016-04-15 DIAGNOSIS — F419 Anxiety disorder, unspecified: Secondary | ICD-10-CM | POA: Diagnosis not present

## 2016-04-16 ENCOUNTER — Other Ambulatory Visit (HOSPITAL_COMMUNITY): Payer: Self-pay | Admitting: Internal Medicine

## 2016-04-16 DIAGNOSIS — R1312 Dysphagia, oropharyngeal phase: Secondary | ICD-10-CM | POA: Diagnosis not present

## 2016-04-16 DIAGNOSIS — I482 Chronic atrial fibrillation: Secondary | ICD-10-CM | POA: Diagnosis not present

## 2016-04-16 DIAGNOSIS — R1319 Other dysphagia: Secondary | ICD-10-CM

## 2016-04-17 DIAGNOSIS — I482 Chronic atrial fibrillation: Secondary | ICD-10-CM | POA: Diagnosis not present

## 2016-04-17 DIAGNOSIS — R1312 Dysphagia, oropharyngeal phase: Secondary | ICD-10-CM | POA: Diagnosis not present

## 2016-04-18 ENCOUNTER — Ambulatory Visit (HOSPITAL_COMMUNITY)
Admission: RE | Admit: 2016-04-18 | Discharge: 2016-04-18 | Disposition: A | Discharge status: Home / Self Care | Payer: PRIVATE HEALTH INSURANCE | Source: Ambulatory Visit | Attending: Internal Medicine | Admitting: Internal Medicine

## 2016-04-18 DIAGNOSIS — R1312 Dysphagia, oropharyngeal phase: Secondary | ICD-10-CM | POA: Diagnosis not present

## 2016-04-18 DIAGNOSIS — I639 Cerebral infarction, unspecified: Secondary | ICD-10-CM | POA: Diagnosis not present

## 2016-04-18 DIAGNOSIS — R1310 Dysphagia, unspecified: Secondary | ICD-10-CM | POA: Diagnosis not present

## 2016-04-18 DIAGNOSIS — I482 Chronic atrial fibrillation: Secondary | ICD-10-CM | POA: Diagnosis not present

## 2016-04-18 DIAGNOSIS — R1319 Other dysphagia: Secondary | ICD-10-CM

## 2016-04-19 DIAGNOSIS — I482 Chronic atrial fibrillation: Secondary | ICD-10-CM | POA: Diagnosis not present

## 2016-04-19 DIAGNOSIS — R1312 Dysphagia, oropharyngeal phase: Secondary | ICD-10-CM | POA: Diagnosis not present

## 2016-04-21 DIAGNOSIS — I482 Chronic atrial fibrillation: Secondary | ICD-10-CM | POA: Diagnosis not present

## 2016-04-21 DIAGNOSIS — R1312 Dysphagia, oropharyngeal phase: Secondary | ICD-10-CM | POA: Diagnosis not present

## 2016-04-22 DIAGNOSIS — I482 Chronic atrial fibrillation: Secondary | ICD-10-CM | POA: Diagnosis not present

## 2016-04-22 DIAGNOSIS — R1312 Dysphagia, oropharyngeal phase: Secondary | ICD-10-CM | POA: Diagnosis not present

## 2016-04-24 DIAGNOSIS — I482 Chronic atrial fibrillation: Secondary | ICD-10-CM | POA: Diagnosis not present

## 2016-04-24 DIAGNOSIS — R1312 Dysphagia, oropharyngeal phase: Secondary | ICD-10-CM | POA: Diagnosis not present

## 2016-04-25 DIAGNOSIS — R1312 Dysphagia, oropharyngeal phase: Secondary | ICD-10-CM | POA: Diagnosis not present

## 2016-04-25 DIAGNOSIS — I482 Chronic atrial fibrillation: Secondary | ICD-10-CM | POA: Diagnosis not present

## 2016-04-26 DIAGNOSIS — R1312 Dysphagia, oropharyngeal phase: Secondary | ICD-10-CM | POA: Diagnosis not present

## 2016-04-26 DIAGNOSIS — I482 Chronic atrial fibrillation: Secondary | ICD-10-CM | POA: Diagnosis not present

## 2016-04-27 DIAGNOSIS — I482 Chronic atrial fibrillation: Secondary | ICD-10-CM | POA: Diagnosis not present

## 2016-04-27 DIAGNOSIS — R1312 Dysphagia, oropharyngeal phase: Secondary | ICD-10-CM | POA: Diagnosis not present

## 2016-04-29 DIAGNOSIS — R1312 Dysphagia, oropharyngeal phase: Secondary | ICD-10-CM | POA: Diagnosis not present

## 2016-04-29 DIAGNOSIS — I482 Chronic atrial fibrillation: Secondary | ICD-10-CM | POA: Diagnosis not present

## 2016-04-30 DIAGNOSIS — I482 Chronic atrial fibrillation: Secondary | ICD-10-CM | POA: Diagnosis not present

## 2016-04-30 DIAGNOSIS — R1312 Dysphagia, oropharyngeal phase: Secondary | ICD-10-CM | POA: Diagnosis not present

## 2016-05-01 DIAGNOSIS — I482 Chronic atrial fibrillation: Secondary | ICD-10-CM | POA: Diagnosis not present

## 2016-05-01 DIAGNOSIS — R1312 Dysphagia, oropharyngeal phase: Secondary | ICD-10-CM | POA: Diagnosis not present

## 2016-05-02 ENCOUNTER — Encounter: Payer: Self-pay | Admitting: Internal Medicine

## 2016-05-02 DIAGNOSIS — R1312 Dysphagia, oropharyngeal phase: Secondary | ICD-10-CM | POA: Diagnosis not present

## 2016-05-02 DIAGNOSIS — I482 Chronic atrial fibrillation: Secondary | ICD-10-CM | POA: Diagnosis not present

## 2016-05-02 NOTE — Assessment & Plan Note (Signed)
Improved on seroquel 25 mg daily and 50 mg qHS; cont current regimen

## 2016-05-02 NOTE — Assessment & Plan Note (Signed)
Stable and chronic without major decline; cont supportive care

## 2016-05-02 NOTE — Assessment & Plan Note (Signed)
33/1.1, improved from prior; will monitor at intervals

## 2016-05-03 ENCOUNTER — Encounter: Payer: Self-pay | Admitting: Internal Medicine

## 2016-05-03 ENCOUNTER — Non-Acute Institutional Stay (SKILLED_NURSING_FACILITY): Payer: Medicare Other | Admitting: Internal Medicine

## 2016-05-03 DIAGNOSIS — E559 Vitamin D deficiency, unspecified: Secondary | ICD-10-CM | POA: Diagnosis not present

## 2016-05-03 DIAGNOSIS — E034 Atrophy of thyroid (acquired): Secondary | ICD-10-CM

## 2016-05-03 DIAGNOSIS — R1312 Dysphagia, oropharyngeal phase: Secondary | ICD-10-CM | POA: Diagnosis not present

## 2016-05-03 DIAGNOSIS — E785 Hyperlipidemia, unspecified: Secondary | ICD-10-CM | POA: Diagnosis not present

## 2016-05-03 DIAGNOSIS — I482 Chronic atrial fibrillation: Secondary | ICD-10-CM | POA: Diagnosis not present

## 2016-05-03 NOTE — Progress Notes (Signed)
Location:  [place] Room Number: [phone] Place of Service:  SNF [phone])  [name], MD  Patient Care Team: [name], MD as PCP - General (Internal Medicine)  Extended Emergency Contact Information Primary Emergency Contact: [facility] Address: [address]          [place], [place] [address] Johnnette Litter of [place] Phone: [phone] Relation: Son Secondary Emergency Contact: [place], [place] [address] Johnnette Litter of [place] Phone: [phone] Relation: Son    Allergies: Patient has no known allergies.  Chief Complaint  Patient presents with  . Medical Management of Chronic Issues    Routine Visit    HPI: Patient is 83 y.o. female who is being seen for routine issues of Vit D def, HLD, and hypothyroidism.  Past Medical History:  Diagnosis Date  . Chronic atrial fibrillation ([place])   . Chronic renal insufficiency, stage III (moderate)   . Constipation   . CVA (cerebral infarction) approx 2012   residual defect: poor R sided peripheral vision per son  . Depression   . History of fracture of right hip 03/2015  . Hyperlipidemia   . Hypertension   . Hypothyroidism   . Insomnia   . Osteoarthritis    wrists, hands  . Osteoporosis     Past Surgical History:  Procedure Laterality Date  . APPENDECTOMY    . COLONOSCOPY  08/22/08  . INTRAMEDULLARY (IM) NAIL INTERTROCHANTERIC Right 03/22/2015   Procedure: RIGHT femoral HIP NAILING;  Surgeon: Gaynelle Arabian, MD;  Location: [facility];  Service: Orthopedics;  Laterality: Right;  . KNEE ARTHROSCOPY Right   . THYROID SURGERY      Allergies as of 05/03/2016   No Known Allergies     Medication List       Accurate as of 05/03/16 11:59 PM. Always use your most recent med list.          acetaminophen 500 MG tablet Commonly known as:  TYLENOL Take 2 tablets (1,000 mg total) by mouth every 6 (six) hours as needed for mild pain, moderate pain or  headache.   atenolol 50 MG tablet Commonly known as:  TENORMIN Take 50 mg by mouth daily.   ELIQUIS 2.5 MG Tabs tablet Generic drug:  apixaban Take 2.5 mg by mouth 2 (two) times daily. 9am and 5pm   feeding supplement Liqd Take 1 Container by mouth 3 (three) times daily between meals.   levothyroxine 25 MCG tablet Commonly known as:  SYNTHROID, LEVOTHROID Take 25 mcg by mouth daily before breakfast.   ondansetron 4 MG disintegrating tablet Commonly known as:  ZOFRAN-ODT Take 4 mg by mouth every 8 (eight) hours as needed for nausea or vomiting.   pravastatin 40 MG tablet Commonly known as:  PRAVACHOL Take 1 tablet (40 mg total) by mouth at bedtime.   QUEtiapine 25 MG tablet Commonly known as:  SEROQUEL Take 25 mg by mouth daily.   QUEtiapine 50 MG tablet Commonly known as:  SEROQUEL Take 50 mg by mouth at bedtime.   senna 8.6 MG tablet Commonly known as:  SENOKOT Take 1 tablet by mouth at bedtime as needed for constipation.   tizanidine 2 MG capsule Commonly known as:  ZANAFLEX Take 2 mg by mouth every 8 (eight) hours.       No orders of the defined types were placed in this encounter.   Immunization History  Administered Date(s) Administered  .  Influenza Split 11/20/2012  . Influenza-Unspecified 10/07/2015  . PPD Test 08/14/2015  . Pneumococcal Conjugate-13 05/22/2015  . Pneumococcal Polysaccharide-23 08/22/2008  . Tdap 01/01/2015, 08/06/2015, 01/10/2016    Social History  Substance Use Topics  . Smoking status: Never Smoker  . Smokeless tobacco: Never Used  . Alcohol use No    Review of Systems  UTO 2/2 dementia; nursing without concerns    Vitals:   05/03/16 0838  BP: 120/83  Pulse: 76  Resp: 18  Temp: 98.5 F (36.9 C)   Body mass index is 18.57 kg/m. Physical Exam  GENERAL APPEARANCE: Alert,  No acute distress  SKIN: No diaphoresis rash HEENT: Unremarkable RESPIRATORY: Breathing is even, unlabored. Lung sounds are clear    CARDIOVASCULAR: Heart RRR no murmurs, rubs or gallops. No peripheral edema  GASTROINTESTINAL: Abdomen is soft, non-tender, not distended w/ normal bowel sounds.  GENITOURINARY: Bladder non tender, not distended  MUSCULOSKELETAL: No abnormal joints or musculature NEUROLOGIC: Cranial nerves 2-12 grossly intact. Moves all extremities PSYCHIATRIC: Mood and affect with dementia, no behavioral issues  Patient Active Problem List   Diagnosis Date Noted  . Femoral neck fracture ([place]) 12/01/2015  . Malnutrition of moderate degree 12/01/2015  . Closed fracture of neck of left femur ([place])   . Mood disorder ([place]) 11/19/2015  . Dementia with behavioral disturbance 11/08/2015  . CKD (chronic kidney disease) stage 3, GFR 30-59 ml/min 09/17/2015  . Insomnia 08/18/2015  . Confusion 06/20/2015  . Recent urinary tract infection 06/20/2015  . Dysuria 04/10/2015  . Edema 04/06/2015  . Acute encephalopathy 03/29/2015  . Vitamin D deficiency 03/29/2015  . UTI (lower urinary tract infection) 03/21/2015  . Hip fracture ([place]) 03/20/2015  . Chronic atrial fibrillation ([place]) 02/20/2015  . Hyperlipidemia 02/20/2015  . Essential hypertension, benign 12/13/2012  . Cardiac arrhythmia 12/13/2012  . Urinary incontinence 12/13/2012  . Hypothyroidism 12/13/2012  . History of depression 12/11/2012    CMP     Component Value Date/Time   NA 141 02/20/2016   K 4.7 02/20/2016   CL 103 01/10/2016 0030   CO2 23 01/10/2016 0030   GLUCOSE 111 (H) 01/10/2016 0030   BUN 33 (A) 02/20/2016   CREATININE 1.1 02/20/2016   CREATININE 1.26 (H) 01/10/2016 0030   CALCIUM 8.6 (L) 01/10/2016 0030   PROT 6.9 12/01/2015 0506   ALBUMIN 3.6 12/01/2015 0506   AST 18 02/20/2016   ALT 10 02/20/2016   ALKPHOS 101 02/20/2016   BILITOT 0.6 12/01/2015 0506   GFRNONAA 36 (L) 01/10/2016 0030   GFRAA 41 (L) 01/10/2016 0030    Recent Labs  08/06/15 0220  12/01/15 0029 12/01/15 0506 12/08/15 01/10/16 01/10/16 0030 02/20/16  NA  133*  < > 141  141 141  141 143 139 139 141  K 3.6  < > 4.4  4.4 4.2 4.2  --  3.9 4.7  CL 97*  --  109 108  --   --  103  --   CO2 26  --   --  24  --   --  23  --   GLUCOSE 125*  --  111* 175*  --   --  111*  --   BUN 25*  < > 46*  46* 40*  40* 57* 31* 31* 33*  CREATININE 1.44*  < > 1.70*  1.7* 1.64*  1.6* 1.6* 1.3* 1.26* 1.1  CALCIUM 8.7*  --   --  9.1  --   --  8.6*  --   < > =  values in this interval not displayed.  Recent Labs  12/01/15 0506 02/20/16  AST 24 18  ALT 16 10  ALKPHOS 75 101  BILITOT 0.6  --   PROT 6.9  --   ALBUMIN 3.6  --     Recent Labs  12/01/15 0111 12/01/15 0506  12/02/15 0429 01/10/16 01/10/16 0030  WBC 10.0  10.0 12.4  12.4*  < > 11.1* 7.6 7.6  NEUTROABS 7.8* 11.0*  --   --   --  5.4  HGB 11.2*  11.2* 11.9*  11.9*  --  11.6*  --  11.3*  HCT 35.1*  35* 36.6  37  --  35.9*  --  34.8*  MCV 90.2 89.3  --  88.9  --  88.8  PLT 242  242 248  248  --  227  --  306  < > = values in this interval not displayed.  Recent Labs  08/24/15 02/20/16  CHOL 107 136  LDLCALC 37 60  TRIG 77 98   No results found for: [facility] Lab Results  Component Value Date   TSH 0.83 02/20/2016   Lab Results  Component Value Date   HGBA1C 5.4 02/20/2016   Lab Results  Component Value Date   CHOL 136 02/20/2016   HDL 57 02/20/2016   LDLCALC 60 02/20/2016   TRIG 98 02/20/2016    Significant Diagnostic Results in last 30 days:  No results found.  Assessment and Plan  Vitamin D deficiency Vit D level, most recent > 60; Vit d has been d/c  Hyperlipidemia Well controlled ; plan to cont pravachol 40 mg daily  Hypothyroidism Most recent TSH 0.83, better than prior; plan to cont 25 mcg daily     Shayde Gervacio D. [name], MD

## 2016-05-13 DIAGNOSIS — F22 Delusional disorders: Secondary | ICD-10-CM | POA: Diagnosis not present

## 2016-05-13 DIAGNOSIS — G4700 Insomnia, unspecified: Secondary | ICD-10-CM | POA: Diagnosis not present

## 2016-05-13 DIAGNOSIS — F419 Anxiety disorder, unspecified: Secondary | ICD-10-CM | POA: Diagnosis not present

## 2016-05-13 DIAGNOSIS — R4589 Other symptoms and signs involving emotional state: Secondary | ICD-10-CM | POA: Diagnosis not present

## 2016-05-24 DIAGNOSIS — D649 Anemia, unspecified: Secondary | ICD-10-CM | POA: Diagnosis not present

## 2016-05-24 DIAGNOSIS — I10 Essential (primary) hypertension: Secondary | ICD-10-CM | POA: Diagnosis not present

## 2016-05-24 LAB — BASIC METABOLIC PANEL
BUN: 41 — AB (ref 4–21)
Creatinine: 1.3 — AB (ref 0.5–1.1)
Glucose: 78

## 2016-05-29 ENCOUNTER — Non-Acute Institutional Stay (SKILLED_NURSING_FACILITY): Payer: Medicare Other | Admitting: Internal Medicine

## 2016-05-29 ENCOUNTER — Encounter: Payer: Self-pay | Admitting: Internal Medicine

## 2016-05-29 DIAGNOSIS — I10 Essential (primary) hypertension: Secondary | ICD-10-CM | POA: Diagnosis not present

## 2016-05-29 DIAGNOSIS — I482 Chronic atrial fibrillation, unspecified: Secondary | ICD-10-CM

## 2016-05-29 DIAGNOSIS — F39 Unspecified mood [affective] disorder: Secondary | ICD-10-CM | POA: Diagnosis not present

## 2016-05-29 NOTE — Progress Notes (Signed)
Location:  [facility] Room Number: [phone] Place of Service:  SNF [phone])  [name], MD  Patient Care Team: [name], MD as PCP - General (Internal Medicine)  Extended Emergency Contact Information Primary Emergency Contact: [facility] Address: [address]          [place], [place] [address] Johnnette Litter of [place] Phone: [phone] Relation: Son Secondary Emergency Contact: [place], [place] [address] Johnnette Litter of [place] Phone: [phone] Relation: Son    Allergies: Patient has no known allergies.  Chief Complaint  Patient presents with  . Medical Management of Chronic Issues    Routine Visit    HPI: Patient is 83 y.o. female who Is being seen for routine issues of mood disorder, chronic atrial fib, and hypertension.  Past Medical History:  Diagnosis Date  . Acute encephalopathy 03/29/2015  . Cardiac arrhythmia 12/13/2012   Per pt, takes atenolol, pradaxa. asymptomatic.   Marland Kitchen Chronic atrial fibrillation ([place])   . Chronic renal insufficiency, stage III (moderate)   . Confusion 06/20/2015  . Constipation   . CVA (cerebral infarction) approx 2012   residual defect: poor R sided peripheral vision per son  . Depression   . Femoral neck fracture ([place]) 12/01/2015  . Hip fracture ([place]) 03/20/2015  . History of fracture of right hip 03/2015  . Hyperlipidemia   . Hypertension   . Hypothyroidism   . Insomnia   . Osteoarthritis    wrists, hands  . Osteoporosis   . Urinary incontinence 12/13/2012  . Vitamin D deficiency 03/29/2015    Past Surgical History:  Procedure Laterality Date  . APPENDECTOMY    . COLONOSCOPY  08/22/08  . INTRAMEDULLARY (IM) NAIL INTERTROCHANTERIC Right 03/22/2015   Procedure: RIGHT femoral HIP NAILING;  Surgeon: Gaynelle Arabian, MD;  Location: [facility];  Service: Orthopedics;  Laterality: Right;  . KNEE ARTHROSCOPY Right   . THYROID SURGERY      Allergies as of 05/29/2016     No Known Allergies     Medication List       Accurate as of 05/29/16 11:59 PM. Always use your most recent med list.          acetaminophen 500 MG tablet Commonly known as:  TYLENOL Take 2 tablets (1,000 mg total) by mouth every 6 (six) hours as needed for mild pain, moderate pain or headache.   atenolol 50 MG tablet Commonly known as:  TENORMIN Take 50 mg by mouth daily.   ELIQUIS 2.5 MG Tabs tablet Generic drug:  apixaban Take 2.5 mg by mouth 2 (two) times daily. 9am and 5pm   feeding supplement Liqd Take 1 Container by mouth 3 (three) times daily between meals.   levothyroxine 25 MCG tablet Commonly known as:  SYNTHROID, LEVOTHROID Take 25 mcg by mouth daily before breakfast.   ondansetron 4 MG disintegrating tablet Commonly known as:  ZOFRAN-ODT Take 4 mg by mouth every 8 (eight) hours as needed for nausea or vomiting.   pravastatin 40 MG tablet Commonly known as:  PRAVACHOL Take 1 tablet (40 mg total) by mouth at bedtime.   QUEtiapine 25 MG tablet Commonly known as:  SEROQUEL Take 25 mg by mouth daily.   QUEtiapine 50 MG tablet Commonly known as:  SEROQUEL Take 50 mg by mouth at bedtime.   senna 8.6 MG tablet Commonly known as:  SENOKOT Take 1 tablet by mouth at bedtime as  needed for constipation.   tizanidine 2 MG capsule Commonly known as:  ZANAFLEX Take 2 mg by mouth every 8 (eight) hours.       No orders of the defined types were placed in this encounter.   Immunization History  Administered Date(s) Administered  . Influenza Split 11/20/2012  . Influenza-Unspecified 10/07/2015  . PPD Test 08/14/2015  . Pneumococcal Conjugate-13 05/22/2015  . Pneumococcal Polysaccharide-23 08/22/2008  . Tdap 01/01/2015, 08/06/2015, 01/10/2016    Social History  Substance Use Topics  . Smoking status: Never Smoker  . Smokeless tobacco: Never Used  . Alcohol use No    Review of Systems  UTO 2/2 dementia;nursing without concerns    Vitals:    05/29/16 0853  BP: 110/74  Pulse: 77  Resp: 18  Temp: 97.1 F (36.2 C)   Body mass index is 19.17 kg/m. Physical Exam  GENERAL APPEARANCE: Alert, conversant, No acute distress  SKIN: No diaphoresis rash HEENT: Unremarkable RESPIRATORY: Breathing is even, unlabored. Lung sounds are clear   CARDIOVASCULAR: Heart irreg no murmurs, rubs or gallops. No peripheral edema  GASTROINTESTINAL: Abdomen is soft, non-tender, not distended w/ normal bowel sounds.  GENITOURINARY: Bladder non tender, not distended  MUSCULOSKELETAL: No abnormal joints or musculature NEUROLOGIC: Cranial nerves 2-12 grossly intact. Moves all extremities PSYCHIATRIC: Mood and affect with dementia, no behavioral issues  Patient Active Problem List   Diagnosis Date Noted  . Failure to thrive in adult 07/14/2016  . Femoral neck fracture ([place]) 12/01/2015  . Malnutrition of moderate degree 12/01/2015  . Closed fracture of neck of left femur ([place])   . Mood disorder ([place]) 11/19/2015  . Dementia with behavioral disturbance 11/08/2015  . CKD (chronic kidney disease) stage 3, GFR 30-59 ml/min 09/17/2015  . Insomnia 08/18/2015  . Confusion 06/20/2015  . Recent urinary tract infection 06/20/2015  . Dysuria 04/10/2015  . Edema 04/06/2015  . Acute encephalopathy 03/29/2015  . Vitamin D deficiency 03/29/2015  . UTI (lower urinary tract infection) 03/21/2015  . Hip fracture ([place]) 03/20/2015  . Chronic atrial fibrillation ([place]) 02/20/2015  . Hyperlipidemia 02/20/2015  . Essential hypertension, benign 12/13/2012  . Cardiac arrhythmia 12/13/2012  . Urinary incontinence 12/13/2012  . Hypothyroidism 12/13/2012  . History of depression 12/11/2012    CMP     Component Value Date/Time   NA 141 02/20/2016   K 4.7 02/20/2016   CL 103 01/10/2016 0030   CO2 23 01/10/2016 0030   GLUCOSE 111 (H) 01/10/2016 0030   BUN 41 (A) 05/24/2016   CREATININE 1.3 (A) 05/24/2016   CREATININE 1.26 (H) 01/10/2016 0030   CALCIUM 8.6 (L)  01/10/2016 0030   PROT 6.9 12/01/2015 0506   ALBUMIN 3.6 12/01/2015 0506   AST 18 02/20/2016   ALT 10 02/20/2016   ALKPHOS 101 02/20/2016   BILITOT 0.6 12/01/2015 0506   GFRNONAA 36 (L) 01/10/2016 0030   GFRAA 41 (L) 01/10/2016 0030    Recent Labs  08/06/15 0220  12/01/15 0029 12/01/15 0506 12/08/15 01/10/16 01/10/16 0030 02/20/16 05/24/16  NA 133*  < > 141  141 141  141 143 139 139 141  --   K 3.6  < > 4.4  4.4 4.2 4.2  --  3.9 4.7  --   CL 97*  --  109 108  --   --  103  --   --   CO2 26  --   --  24  --   --  23  --   --  GLUCOSE 125*  --  111* 175*  --   --  111*  --   --   BUN 25*  < > 46*  46* 40*  40* 57* 31* 31* 33* 41*  CREATININE 1.44*  < > 1.70*  1.7* 1.64*  1.6* 1.6* 1.3* 1.26* 1.1 1.3*  CALCIUM 8.7*  --   --  9.1  --   --  8.6*  --   --   < > = values in this interval not displayed.  Recent Labs  12/01/15 0506 02/20/16  AST 24 18  ALT 16 10  ALKPHOS 75 101  BILITOT 0.6  --   PROT 6.9  --   ALBUMIN 3.6  --     Recent Labs  12/01/15 0111 12/01/15 0506  12/02/15 0429 01/10/16 01/10/16 0030  WBC 10.0  10.0 12.4  12.4*  < > 11.1* 7.6 7.6  NEUTROABS 7.8* 11.0*  --   --   --  5.4  HGB 11.2*  11.2* 11.9*  11.9*  --  11.6*  --  11.3*  HCT 35.1*  35* 36.6  37  --  35.9*  --  34.8*  MCV 90.2 89.3  --  88.9  --  88.8  PLT 242  242 248  248  --  227  --  306  < > = values in this interval not displayed.  Recent Labs  08/24/15 02/20/16  CHOL 107 136  LDLCALC 37 60  TRIG 77 98   No results found for: [facility] Lab Results  Component Value Date   TSH 0.83 02/20/2016   Lab Results  Component Value Date   HGBA1C 5.4 02/20/2016   Lab Results  Component Value Date   CHOL 136 02/20/2016   HDL 57 02/20/2016   LDLCALC 60 02/20/2016   TRIG 98 02/20/2016    Significant Diagnostic Results in last 30 days:  No results found.  Assessment and Plan  Mood disorder ([place]) Continues to be stable on Seroquel 25 mg by mouth daily and 50 mg by  mouth at bedtime; plan to continue current regimen  Chronic atrial fibrillation ([facility]) Chronic aches stable with no reported problems; plan to continue Tenormin 50 mg by mouth daily and Eliquis 2.5 mg by mouth twice a day  Essential hypertension, benign Stable; well-controlled on atenolol 50 mg by mouth daily     Ahmir Bracken D. [name], MD

## 2016-06-02 ENCOUNTER — Encounter: Payer: Self-pay | Admitting: Internal Medicine

## 2016-06-02 NOTE — Assessment & Plan Note (Signed)
Well controlled ; plan to cont pravachol 40 mg daily

## 2016-06-02 NOTE — Assessment & Plan Note (Signed)
Most recent TSH 0.83, better than prior; plan to cont 25 mcg daily

## 2016-06-02 NOTE — Assessment & Plan Note (Signed)
Vit D level, most recent > 60; Vit d has been d/c

## 2016-06-11 DIAGNOSIS — F29 Unspecified psychosis not due to a substance or known physiological condition: Secondary | ICD-10-CM | POA: Diagnosis not present

## 2016-06-11 DIAGNOSIS — F419 Anxiety disorder, unspecified: Secondary | ICD-10-CM | POA: Diagnosis not present

## 2016-06-11 DIAGNOSIS — R4589 Other symptoms and signs involving emotional state: Secondary | ICD-10-CM | POA: Diagnosis not present

## 2016-06-11 DIAGNOSIS — G4700 Insomnia, unspecified: Secondary | ICD-10-CM | POA: Diagnosis not present

## 2016-06-12 DIAGNOSIS — M6281 Muscle weakness (generalized): Secondary | ICD-10-CM | POA: Diagnosis not present

## 2016-06-12 DIAGNOSIS — Z993 Dependence on wheelchair: Secondary | ICD-10-CM | POA: Diagnosis not present

## 2016-06-12 DIAGNOSIS — I482 Chronic atrial fibrillation: Secondary | ICD-10-CM | POA: Diagnosis not present

## 2016-07-08 ENCOUNTER — Non-Acute Institutional Stay (SKILLED_NURSING_FACILITY): Payer: Medicare Other | Admitting: Internal Medicine

## 2016-07-08 DIAGNOSIS — N183 Chronic kidney disease, stage 3 unspecified: Secondary | ICD-10-CM

## 2016-07-08 DIAGNOSIS — F0281 Dementia in other diseases classified elsewhere with behavioral disturbance: Secondary | ICD-10-CM

## 2016-07-08 DIAGNOSIS — G301 Alzheimer's disease with late onset: Secondary | ICD-10-CM | POA: Diagnosis not present

## 2016-07-08 DIAGNOSIS — E034 Atrophy of thyroid (acquired): Secondary | ICD-10-CM | POA: Diagnosis not present

## 2016-07-08 NOTE — Progress Notes (Signed)
Location:  [facility] Room Number: [place]:  SNF ([phone])  [name], MD  Patient Care Team: [name], MD as PCP - General (Internal Medicine)  Extended Emergency Contact Information Primary Emergency Contact: [facility] Address: [address]          [place], [place] [address] Johnnette Litter of [place] Phone: [phone] Relation: Son Secondary Emergency Contact: [place], [place] [address] Johnnette Litter of [place] Phone: [phone] Relation: Son    Allergies: Patient has no known allergies.  Chief Complaint  Patient presents with  . Medical Management of Chronic Issues    routine visit    HPI: Patient is 83 y.o. female who Is being seen for routine issues of hypothyroidism, dementia, and chronic kidney disease stage III.  Past Medical History:  Diagnosis Date  . Acute encephalopathy 03/29/2015  . Cardiac arrhythmia 12/13/2012   Per pt, takes atenolol, pradaxa. asymptomatic.   Marland Kitchen Chronic atrial fibrillation ([place])   . Chronic renal insufficiency, stage III (moderate)   . Confusion 06/20/2015  . Constipation   . CVA (cerebral infarction) approx 2012   residual defect: poor R sided peripheral vision per son  . Depression   . Femoral neck fracture ([place]) 12/01/2015  . Hip fracture ([place]) 03/20/2015  . History of fracture of right hip 03/2015  . Hyperlipidemia   . Hypertension   . Hypothyroidism   . Insomnia   . Osteoarthritis    wrists, hands  . Osteoporosis   . Urinary incontinence 12/13/2012  . Vitamin D deficiency 03/29/2015    Past Surgical History:  Procedure Laterality Date  . APPENDECTOMY    . COLONOSCOPY  08/22/08  . INTRAMEDULLARY (IM) NAIL INTERTROCHANTERIC Right 03/22/2015   Procedure: RIGHT femoral HIP NAILING;  Surgeon: Gaynelle Arabian, MD;  Location: [facility];  Service: Orthopedics;  Laterality: Right;  . KNEE ARTHROSCOPY Right   . THYROID SURGERY      Allergies as of  07/08/2016   No Known Allergies     Medication List       Accurate as of 07/08/16 11:59 PM. Always use your most recent med list.          acetaminophen 500 MG tablet Commonly known as:  TYLENOL Take 2 tablets (1,000 mg total) by mouth every 6 (six) hours as needed for mild pain, moderate pain or headache.   atenolol 50 MG tablet Commonly known as:  TENORMIN Take 50 mg by mouth daily.   ELIQUIS 2.5 MG Tabs tablet Generic drug:  apixaban Take 2.5 mg by mouth 2 (two) times daily. 9am and 5pm   feeding supplement Liqd Take 1 Container by mouth 3 (three) times daily between meals.   levothyroxine 25 MCG tablet Commonly known as:  SYNTHROID, LEVOTHROID Take 25 mcg by mouth daily before breakfast.   ondansetron 4 MG disintegrating tablet Commonly known as:  ZOFRAN-ODT Take 4 mg by mouth every 8 (eight) hours as needed for nausea or vomiting.   pravastatin 40 MG tablet Commonly known as:  PRAVACHOL Take 1 tablet (40 mg total) by mouth at bedtime.   QUEtiapine 50 MG tablet Commonly known as:  SEROQUEL Take 50 mg by mouth at bedtime.   senna 8.6 MG tablet Commonly known as:  SENOKOT Take 1 tablet by mouth at bedtime as needed for constipation.   tizanidine 2 MG capsule Commonly known as:  ZANAFLEX Take 2 mg  by mouth every 8 (eight) hours.       No orders of the defined types were placed in this encounter.   Immunization History  Administered Date(s) Administered  . Influenza Split 11/20/2012  . Influenza-Unspecified 10/07/2015  . PPD Test 08/14/2015  . Pneumococcal Conjugate-13 05/22/2015  . Pneumococcal Polysaccharide-23 08/22/2008  . Tdap 01/01/2015, 08/06/2015, 01/10/2016    Social History  Substance Use Topics  . Smoking status: Never Smoker  . Smokeless tobacco: Never Used  . Alcohol use No    Review of Systems-Unable to obtain secondary to dementia; nursing-new concerns    Vitals:   07/08/16 1358  BP: 115/80  Pulse: 71  Resp: 18  Temp: 97.9  F (36.6 C)   Body mass index is 20.43 kg/m. Physical Exam  GENERAL APPEARANCE: Alert,  No acute distress  SKIN: No diaphoresis rash HEENT: Unremarkable RESPIRATORY: Breathing is even, unlabored. Lung sounds are clear   CARDIOVASCULAR: Heart RRR no murmurs, rubs or gallops. No peripheral edema  GASTROINTESTINAL: Abdomen is soft, non-tender, not distended w/ normal bowel sounds.  GENITOURINARY: Bladder non tender, not distended  MUSCULOSKELETAL: No abnormal joints or musculature NEUROLOGIC: Cranial nerves 2-12 grossly intact. Moves all extremities PSYCHIATRIC: Mood and affect With dementia, no behavioral issues  Patient Active Problem List   Diagnosis Date Noted  . Failure to thrive in adult 07/14/2016  . Femoral neck fracture ([place]) 12/01/2015  . Malnutrition of moderate degree 12/01/2015  . Closed fracture of neck of left femur ([place])   . Mood disorder ([place]) 11/19/2015  . Dementia with behavioral disturbance 11/08/2015  . CKD (chronic kidney disease) stage 3, GFR 30-59 ml/min 09/17/2015  . Insomnia 08/18/2015  . Confusion 06/20/2015  . Recent urinary tract infection 06/20/2015  . Dysuria 04/10/2015  . Edema 04/06/2015  . Acute encephalopathy 03/29/2015  . Vitamin D deficiency 03/29/2015  . UTI (lower urinary tract infection) 03/21/2015  . Hip fracture ([place]) 03/20/2015  . Chronic atrial fibrillation ([place]) 02/20/2015  . Hyperlipidemia 02/20/2015  . Essential hypertension, benign 12/13/2012  . Cardiac arrhythmia 12/13/2012  . Urinary incontinence 12/13/2012  . Hypothyroidism 12/13/2012  . History of depression 12/11/2012    CMP     Component Value Date/Time   NA 141 02/20/2016   K 4.7 02/20/2016   CL 103 01/10/2016 0030   CO2 23 01/10/2016 0030   GLUCOSE 111 (H) 01/10/2016 0030   BUN 41 (A) 05/24/2016   CREATININE 1.3 (A) 05/24/2016   CREATININE 1.26 (H) 01/10/2016 0030   CALCIUM 8.6 (L) 01/10/2016 0030   PROT 6.9 12/01/2015 0506   ALBUMIN 3.6 12/01/2015 0506    AST 18 02/20/2016   ALT 10 02/20/2016   ALKPHOS 101 02/20/2016   BILITOT 0.6 12/01/2015 0506   GFRNONAA 36 (L) 01/10/2016 0030   GFRAA 41 (L) 01/10/2016 0030    Recent Labs  08/06/15 0220  12/01/15 0029 12/01/15 0506 12/08/15 01/10/16 01/10/16 0030 02/20/16 05/24/16  NA 133*  < > 141  141 141  141 143 139 139 141  --   K 3.6  < > 4.4  4.4 4.2 4.2  --  3.9 4.7  --   CL 97*  --  109 108  --   --  103  --   --   CO2 26  --   --  24  --   --  23  --   --   GLUCOSE 125*  --  111* 175*  --   --  111*  --   --  BUN 25*  < > 46*  46* 40*  40* 57* 31* 31* 33* 41*  CREATININE 1.44*  < > 1.70*  1.7* 1.64*  1.6* 1.6* 1.3* 1.26* 1.1 1.3*  CALCIUM 8.7*  --   --  9.1  --   --  8.6*  --   --   < > = values in this interval not displayed.  Recent Labs  12/01/15 0506 02/20/16  AST 24 18  ALT 16 10  ALKPHOS 75 101  BILITOT 0.6  --   PROT 6.9  --   ALBUMIN 3.6  --     Recent Labs  12/01/15 0111 12/01/15 0506  12/02/15 0429 01/10/16 01/10/16 0030  WBC 10.0  10.0 12.4  12.4*  < > 11.1* 7.6 7.6  NEUTROABS 7.8* 11.0*  --   --   --  5.4  HGB 11.2*  11.2* 11.9*  11.9*  --  11.6*  --  11.3*  HCT 35.1*  35* 36.6  37  --  35.9*  --  34.8*  MCV 90.2 89.3  --  88.9  --  88.8  PLT 242  242 248  248  --  227  --  306  < > = values in this interval not displayed.  Recent Labs  08/24/15 02/20/16  CHOL 107 136  LDLCALC 37 60  TRIG 77 98   No results found for: [facility] Lab Results  Component Value Date   TSH 0.83 02/20/2016   Lab Results  Component Value Date   HGBA1C 5.4 02/20/2016   Lab Results  Component Value Date   CHOL 136 02/20/2016   HDL 57 02/20/2016   LDLCALC 60 02/20/2016   TRIG 98 02/20/2016    Significant Diagnostic Results in last 30 days:  No results found.  Assessment and Plan  Hypothyroidism Stable; continue Synthroid 25 g daily  Dementia with behavioral disturbance Stable and chronic without major decline; continue supportive  care  CKD (chronic kidney disease) stage 3, GFR 30-59 ml/min Most recent BUN/creatinine is 41/1.3 which is stable from prior; no recent GFR; we will monitor intervals    Luca Burston D. [name], MD

## 2016-07-12 ENCOUNTER — Non-Acute Institutional Stay (SKILLED_NURSING_FACILITY): Payer: Medicare Other | Admitting: Internal Medicine

## 2016-07-12 ENCOUNTER — Encounter: Payer: Self-pay | Admitting: Internal Medicine

## 2016-07-12 DIAGNOSIS — R627 Adult failure to thrive: Secondary | ICD-10-CM | POA: Diagnosis not present

## 2016-07-12 NOTE — Progress Notes (Signed)
Location:  [facility] Room Number: [place]:  SNF ([phone])  [name], MD  Patient Care Team: [name], MD as PCP - General (Internal Medicine)  Extended Emergency Contact Information Primary Emergency Contact: [facility] Address: [address]          [place], [place] [address] Johnnette Litter of [place] Phone: [phone] Relation: Son Secondary Emergency Contact: [place],  [address] Johnnette Litter of [place] Phone: [phone] Relation: Son    Allergies: Patient has no known allergies.  Chief Complaint  Patient presents with  . Acute Visit    HPI: Patient is 83 y.o. female who is being seen for FTT. Patient has been declining and patient's son has made the decision for his mother to be comfort measures only. Patient recently had a fall and apparently hit her head because she had a small contusion but there was no loss of consciousness and patient has not been different in her mental status since her fall. Patient herself is pleasant and has no complaint.  Past Medical History:  Diagnosis Date  . Acute encephalopathy 03/29/2015  . Cardiac arrhythmia 12/13/2012   Per pt, takes atenolol, pradaxa. asymptomatic.   Marland Kitchen Chronic atrial fibrillation ([place])   . Chronic renal insufficiency, stage III (moderate)   . Confusion 06/20/2015  . Constipation   . CVA (cerebral infarction) approx 2012   residual defect: poor R sided peripheral vision per son  . Depression   . Femoral neck fracture ([place]) 12/01/2015  . Hip fracture ([place]) 03/20/2015  . History of fracture of right hip 03/2015  . Hyperlipidemia   . Hypertension   . Hypothyroidism   . Insomnia   . Osteoarthritis    wrists, hands  . Osteoporosis   . Urinary incontinence 12/13/2012  . Vitamin D deficiency 03/29/2015    Past Surgical History:  Procedure Laterality Date  . APPENDECTOMY    . COLONOSCOPY  08/22/08  . INTRAMEDULLARY (IM) NAIL  INTERTROCHANTERIC Right 03/22/2015   Procedure: RIGHT femoral HIP NAILING;  Surgeon: Gaynelle Arabian, MD;  Location: [facility];  Service: Orthopedics;  Laterality: Right;  . KNEE ARTHROSCOPY Right   . THYROID SURGERY      Allergies as of 07/12/2016   No Known Allergies     Medication List       Accurate as of 07/12/16  2:07 PM. Always use your most recent med list.          acetaminophen 500 MG tablet Commonly known as:  TYLENOL Take 2 tablets (1,000 mg total) by mouth every 6 (six) hours as needed for mild pain, moderate pain or headache.   atenolol 50 MG tablet Commonly known as:  TENORMIN Take 50 mg by mouth daily.   ELIQUIS 2.5 MG Tabs tablet Generic drug:  apixaban Take 2.5 mg by mouth 2 (two) times daily. 9am and 5pm   feeding supplement Liqd Take 1 Container by mouth 3 (three) times daily between meals.   levothyroxine 25 MCG tablet Commonly known as:  SYNTHROID, LEVOTHROID Take 25 mcg by mouth daily before breakfast.   ondansetron 4 MG disintegrating tablet Commonly known as:  ZOFRAN-ODT Take 4 mg by mouth every 8 (eight) hours as needed for nausea or vomiting.   pravastatin 40 MG tablet Commonly known as:  PRAVACHOL Take 1 tablet (40 mg total) by mouth at bedtime.   QUEtiapine 50 MG tablet Commonly known  as:  SEROQUEL Take 50 mg by mouth at bedtime.   senna 8.6 MG tablet Commonly known as:  SENOKOT Take 1 tablet by mouth at bedtime as needed for constipation.   tizanidine 2 MG capsule Commonly known as:  ZANAFLEX Take 2 mg by mouth every 8 (eight) hours.       No orders of the defined types were placed in this encounter.   Immunization History  Administered Date(s) Administered  . Influenza Split 11/20/2012  . Influenza-Unspecified 10/07/2015  . PPD Test 08/14/2015  . Pneumococcal Conjugate-13 05/22/2015  . Pneumococcal Polysaccharide-23 08/22/2008  . Tdap 01/01/2015, 08/06/2015, 01/10/2016    Social History  Substance Use Topics  . Smoking  status: Never Smoker  . Smokeless tobacco: Never Used  . Alcohol use No    Review of Systems UTO 2/2 dementia; per nursing no new concerns    Vitals:   07/12/16 1400  BP: 115/80  Pulse: 71  Resp: 18  Temp: 97.9 F (36.6 C)   Body mass index is 20.43 kg/m. Physical Exam  GENERAL APPEARANCE: Alert, pleasant, No acute distress  SKIN: No diaphoresis rash HEENT: Unremarkable RESPIRATORY: Breathing is even, unlabored. Lung sounds are clear   CARDIOVASCULAR: Heart RRR no murmurs, rubs or gallops. No peripheral edema  GASTROINTESTINAL: Abdomen is soft, non-tender, not distended w/ normal bowel sounds.  GENITOURINARY: Bladder non tender, not distended  MUSCULOSKELETAL: No abnormal joints or musculature NEUROLOGIC: Cranial nerves 2-12 grossly intact. Moves all extremities PSYCHIATRIC: dementia, no behavioral issues  Patient Active Problem List   Diagnosis Date Noted  . Femoral neck fracture ([place]) 12/01/2015  . Malnutrition of moderate degree 12/01/2015  . Closed fracture of neck of left femur ([place])   . Mood disorder ([place]) 11/19/2015  . Dementia with behavioral disturbance 11/08/2015  . CKD (chronic kidney disease) stage 3, GFR 30-59 ml/min 09/17/2015  . Insomnia 08/18/2015  . Confusion 06/20/2015  . Recent urinary tract infection 06/20/2015  . Dysuria 04/10/2015  . Edema 04/06/2015  . Acute encephalopathy 03/29/2015  . Vitamin D deficiency 03/29/2015  . UTI (lower urinary tract infection) 03/21/2015  . Hip fracture ([place]) 03/20/2015  . Chronic atrial fibrillation ([place]) 02/20/2015  . Hyperlipidemia 02/20/2015  . Essential hypertension, benign 12/13/2012  . Cardiac arrhythmia 12/13/2012  . Urinary incontinence 12/13/2012  . Hypothyroidism 12/13/2012  . History of depression 12/11/2012    CMP     Component Value Date/Time   NA 141 02/20/2016   K 4.7 02/20/2016   CL 103 01/10/2016 0030   CO2 23 01/10/2016 0030   GLUCOSE 111 (H) 01/10/2016 0030   BUN 41 (A)  05/24/2016   CREATININE 1.3 (A) 05/24/2016   CREATININE 1.26 (H) 01/10/2016 0030   CALCIUM 8.6 (L) 01/10/2016 0030   PROT 6.9 12/01/2015 0506   ALBUMIN 3.6 12/01/2015 0506   AST 18 02/20/2016   ALT 10 02/20/2016   ALKPHOS 101 02/20/2016   BILITOT 0.6 12/01/2015 0506   GFRNONAA 36 (L) 01/10/2016 0030   GFRAA 41 (L) 01/10/2016 0030    Recent Labs  08/06/15 0220  12/01/15 0029 12/01/15 0506 12/08/15 01/10/16 01/10/16 0030 02/20/16 05/24/16  NA 133*  < > 141  141 141  141 143 139 139 141  --   K 3.6  < > 4.4  4.4 4.2 4.2  --  3.9 4.7  --   CL 97*  --  109 108  --   --  103  --   --   CO2 26  --   --  24  --   --  23  --   --   GLUCOSE 125*  --  111* 175*  --   --  111*  --   --   BUN 25*  < > 46*  46* 40*  40* 57* 31* 31* 33* 41*  CREATININE 1.44*  < > 1.70*  1.7* 1.64*  1.6* 1.6* 1.3* 1.26* 1.1 1.3*  CALCIUM 8.7*  --   --  9.1  --   --  8.6*  --   --   < > = values in this interval not displayed.  Recent Labs  12/01/15 0506 02/20/16  AST 24 18  ALT 16 10  ALKPHOS 75 101  BILITOT 0.6  --   PROT 6.9  --   ALBUMIN 3.6  --     Recent Labs  12/01/15 0111 12/01/15 0506  12/02/15 0429 01/10/16 01/10/16 0030  WBC 10.0  10.0 12.4  12.4*  < > 11.1* 7.6 7.6  NEUTROABS 7.8* 11.0*  --   --   --  5.4  HGB 11.2*  11.2* 11.9*  11.9*  --  11.6*  --  11.3*  HCT 35.1*  35* 36.6  37  --  35.9*  --  34.8*  MCV 90.2 89.3  --  88.9  --  88.8  PLT 242  242 248  248  --  227  --  306  < > = values in this interval not displayed.  Recent Labs  08/24/15 02/20/16  CHOL 107 136  LDLCALC 37 60  TRIG 77 98   No results found for: [facility] Lab Results  Component Value Date   TSH 0.83 02/20/2016   Lab Results  Component Value Date   HGBA1C 5.4 02/20/2016   Lab Results  Component Value Date   CHOL 136 02/20/2016   HDL 57 02/20/2016   LDLCALC 60 02/20/2016   TRIG 98 02/20/2016    Significant Diagnostic Results in last 30 days:  No results found.  Assessment  and Plan  FTT - patient is not too far down the path but is headed in that direction; patient's son Yelina Sarratt has made the decision for his mother to be comfort measures only; I have written an order for her to have no hospitalizations no IV's; if there is any question about treatment for the patient for any reason such as an infection staff will need to speak to the son before any treatments are instituted     Noah Delaine. [name], MD

## 2016-07-14 ENCOUNTER — Encounter: Payer: Self-pay | Admitting: Internal Medicine

## 2016-07-14 DIAGNOSIS — R627 Adult failure to thrive: Secondary | ICD-10-CM | POA: Insufficient documentation

## 2016-07-18 ENCOUNTER — Encounter: Payer: Self-pay | Admitting: Internal Medicine

## 2016-07-18 DIAGNOSIS — F419 Anxiety disorder, unspecified: Secondary | ICD-10-CM | POA: Diagnosis not present

## 2016-07-18 DIAGNOSIS — G4700 Insomnia, unspecified: Secondary | ICD-10-CM | POA: Diagnosis not present

## 2016-07-18 DIAGNOSIS — R4589 Other symptoms and signs involving emotional state: Secondary | ICD-10-CM | POA: Diagnosis not present

## 2016-07-18 DIAGNOSIS — F0390 Unspecified dementia without behavioral disturbance: Secondary | ICD-10-CM | POA: Diagnosis not present

## 2016-07-18 NOTE — Assessment & Plan Note (Addendum)
Continues to be stable on Seroquel 25 mg by mouth daily and 50 mg by mouth at bedtime; plan to continue current regimen

## 2016-07-18 NOTE — Assessment & Plan Note (Signed)
Chronic aches stable with no reported problems; plan to continue Tenormin 50 mg by mouth daily and Eliquis 2.5 mg by mouth twice a day

## 2016-07-18 NOTE — Assessment & Plan Note (Signed)
Stable; well-controlled on atenolol 50 mg by mouth daily

## 2016-07-23 DIAGNOSIS — I739 Peripheral vascular disease, unspecified: Secondary | ICD-10-CM | POA: Diagnosis not present

## 2016-07-23 DIAGNOSIS — M79675 Pain in left toe(s): Secondary | ICD-10-CM | POA: Diagnosis not present

## 2016-07-23 DIAGNOSIS — B351 Tinea unguium: Secondary | ICD-10-CM | POA: Diagnosis not present

## 2016-07-23 DIAGNOSIS — M79674 Pain in right toe(s): Secondary | ICD-10-CM | POA: Diagnosis not present

## 2016-07-24 ENCOUNTER — Non-Acute Institutional Stay (SKILLED_NURSING_FACILITY): Payer: Medicare Other

## 2016-07-24 DIAGNOSIS — Z0000 Encounter for general adult medical examination without abnormal findings: Secondary | ICD-10-CM

## 2016-07-24 NOTE — Progress Notes (Signed)
Subjective:   Marie Robertson is a 83 y.o. female who presents for an Initial Medicare Annual Wellness Visit at [facility]; incapacitated patient unable to answer questions appropriately        Objective:    Today's Vitals   07/24/16 1232  BP: 130/70  Pulse: 70  Temp: 98.5 F (36.9 C)  TempSrc: Oral  SpO2: 97%  Weight: 119 lb (54 kg)  Height: 5\' 4"  (1.626 m)   Body mass index is 20.43 kg/m.   Current Medications (verified) Outpatient Encounter Prescriptions as of 07/24/2016  Medication Sig  . acetaminophen (TYLENOL) 500 MG tablet Take 2 tablets (1,000 mg total) by mouth every 6 (six) hours as needed for mild pain, moderate pain or headache.  Marland Kitchen apixaban (ELIQUIS) 2.5 MG TABS tablet Take 2.5 mg by mouth 2 (two) times daily. 9am and 5pm  . atenolol (TENORMIN) 50 MG tablet Take 50 mg by mouth daily.  . feeding supplement (BOOST / RESOURCE BREEZE) LIQD Take 1 Container by mouth 3 (three) times daily between meals.   Marland Kitchen levothyroxine (SYNTHROID, LEVOTHROID) 25 MCG tablet Take 25 mcg by mouth daily before breakfast.  . ondansetron (ZOFRAN-ODT) 4 MG disintegrating tablet Take 4 mg by mouth every 8 (eight) hours as needed for nausea or vomiting.  . pravastatin (PRAVACHOL) 40 MG tablet Take 1 tablet (40 mg total) by mouth at bedtime.  Marland Kitchen QUEtiapine (SEROQUEL) 50 MG tablet Take 50 mg by mouth at bedtime.  . senna (SENOKOT) 8.6 MG tablet Take 1 tablet by mouth at bedtime as needed for constipation.   . tizanidine (ZANAFLEX) 2 MG capsule Take 2 mg by mouth every 8 (eight) hours.    No facility-administered encounter medications on file as of 07/24/2016.     Allergies (verified) Patient has no known allergies.   History: Past Medical History:  Diagnosis Date  . Acute encephalopathy 03/29/2015  . Cardiac arrhythmia 12/13/2012   Per pt, takes atenolol, pradaxa. asymptomatic.   Marland Kitchen Chronic atrial fibrillation ([place])   . Chronic renal insufficiency, stage III (moderate)   .  Confusion 06/20/2015  . Constipation   . CVA (cerebral infarction) approx 2012   residual defect: poor R sided peripheral vision per son  . Depression   . Femoral neck fracture ([place]) 12/01/2015  . Hip fracture ([place]) 03/20/2015  . History of fracture of right hip 03/2015  . Hyperlipidemia   . Hypertension   . Hypothyroidism   . Insomnia   . Osteoarthritis    wrists, hands  . Osteoporosis   . Urinary incontinence 12/13/2012  . Vitamin D deficiency 03/29/2015   Past Surgical History:  Procedure Laterality Date  . APPENDECTOMY    . COLONOSCOPY  08/22/08  . INTRAMEDULLARY (IM) NAIL INTERTROCHANTERIC Right 03/22/2015   Procedure: RIGHT femoral HIP NAILING;  Surgeon: Gaynelle Arabian, MD;  Location: [facility];  Service: Orthopedics;  Laterality: Right;  . KNEE ARTHROSCOPY Right   . THYROID SURGERY     Family History  Problem Relation Age of Onset  . Stroke Mother   . Diabetes Mother   . Alcohol abuse Father    Social History   Occupational History  . retired Corporate treasurer    Social History Main Topics  . Smoking status: Never Smoker  . Smokeless tobacco: Never Used  . Alcohol use No  . Drug use: No  . Sexual activity: No    Tobacco Counseling Counseling given: Not Answered   Activities of Daily Living In your present  state of health, do you have any difficulty performing the following activities: 07/24/2016  Hearing? Y  Vision? Y  Difficulty concentrating or making decisions? Y  Walking or climbing stairs? Y  Dressing or bathing? Y  Doing errands, shopping? Y  Preparing Food and eating ? Y  Using the Toilet? Y  In the past six months, have you accidently leaked urine? Y  Do you have problems with loss of bowel control? Y  Managing your Medications? Y  Managing your Finances? Y  Housekeeping or managing your Housekeeping? Y  Some recent data might be hidden    Immunizations and Health Maintenance Immunization History  Administered Date(s) Administered  . Influenza Split  11/20/2012  . Influenza-Unspecified 10/07/2015  . PPD Test 08/14/2015  . Pneumococcal Conjugate-13 05/22/2015  . Pneumococcal Polysaccharide-23 08/22/2008  . Tdap 01/01/2015, 08/06/2015, 01/10/2016   There are no preventive care reminders to display for this patient.  Patient Care Team: [name], MD as PCP - General (Internal Medicine)  Indicate any recent Medical Services you may have received from other than [facility] providers in the past year (date may be approximate).     Assessment:   This is a routine wellness examination for Marie Robertson.   Hearing/Vision screen No exam data present  Dietary issues and exercise activities discussed: Current Exercise Habits: The patient does not participate in regular exercise at present, Exercise limited by: orthopedic condition(s);neurologic condition(s)  Goals    None     Depression Screen PHQ 2/9 Scores 07/24/2016  PHQ - 2 Score 1    Fall Risk Fall Risk  07/24/2016  Falls in the past year? Yes  Number falls in past yr: 2 or more  Injury with Fall? Yes    Cognitive Function:     6CIT Screen 07/24/2016  What Year? 4 points  What month? 3 points  What time? 3 points  Count back from 20 4 points  Months in reverse 4 points  Repeat phrase 10 points  Total Score 28    Screening Tests Health Maintenance  Topic Date Due  . INFLUENZA VACCINE  08/07/2016  . TETANUS/TDAP  01/09/2026  . DEXA SCAN  Completed  . PNA vac Low Risk Adult  Completed      Plan:    I have personally reviewed and addressed the Medicare Annual Wellness questionnaire and have noted the following in the patient's chart:  A. Medical and social history B. Use of alcohol, tobacco or illicit drugs  C. Current medications and supplements D. Functional ability and status E.  Nutritional status F.  Physical activity G. Advance directives H. List of other physicians I.  Hospitalizations, surgeries, and ER visits in previous 12 months J.   [place] to include hearing, vision, cognitive, depression L. Referrals and appointments - none  In addition, unable to review and discuss with incapacitated patient certain preventive protocols, quality metrics, and best practice recommendations. A written personalized care plan for preventive services as well as general preventive health recommendations were provided to patient.   See attached scanned questionnaire for additional information.   Signed,   Rich Reining, RN Nurse Health Advisor   Quick Notes   Health Maintenance: Up to date     Abnormal Screen: 6 Cit-28     Patient Concerns: None     Nurse Concerns: None

## 2016-07-24 NOTE — Patient Instructions (Signed)
Marie Robertson , Thank you for taking time to come for your Medicare Wellness Visit. I appreciate your ongoing commitment to your health goals. Please review the following plan we discussed and let me know if I can assist you in the future.   Screening recommendations/referrals: Colonoscopy up to date, pt over age 81 Mammogram up to date, pt over age 52 Bone Density up to date Recommended yearly ophthalmology/optometry visit for glaucoma screening and checkup Recommended yearly dental visit for hygiene and checkup  Vaccinations: Influenza vaccine due 2018 fall season Pneumococcal vaccine up to date Tdap vaccine due 01/09/2026 Shingles vaccine not in records  Advanced directives: In Chart  Conditions/risks identified: None  Next appointment: Dr. [name] makes rounds   Preventive Care 65 Years and Older, Female Preventive care refers to lifestyle choices and visits with your health care provider that can promote health and wellness. What does preventive care include?  A yearly physical exam. This is also called an annual well check.  Dental exams once or twice a year.  Routine eye exams. Ask your health care provider how often you should have your eyes checked.  Personal lifestyle choices, including:  Daily care of your teeth and gums.  Regular physical activity.  Eating a healthy diet.  Avoiding tobacco and drug use.  Limiting alcohol use.  Practicing safe sex.  Taking low-dose aspirin every day.  Taking vitamin and mineral supplements as recommended by your health care provider. What happens during an annual well check? The services and screenings done by your health care provider during your annual well check will depend on your age, overall health, lifestyle risk factors, and family history of disease. Counseling  Your health care provider may ask you questions about your:  Alcohol use.  Tobacco use.  Drug use.  Emotional well-being.  Home and  relationship well-being.  Sexual activity.  Eating habits.  History of falls.  Memory and ability to understand (cognition).  Work and work Statistician.  Reproductive health. Screening  You may have the following tests or measurements:  Height, weight, and BMI.  Blood pressure.  Lipid and cholesterol levels. These may be checked every 5 years, or more frequently if you are over 84 years old.  Skin check.  Lung cancer screening. You may have this screening every year starting at age 5 if you have a 30-pack-year history of smoking and currently smoke or have quit within the past 15 years.  Fecal occult blood test (FOBT) of the stool. You may have this test every year starting at age 6.  Flexible sigmoidoscopy or colonoscopy. You may have a sigmoidoscopy every 5 years or a colonoscopy every 10 years starting at age 88.  Hepatitis C blood test.  Hepatitis B blood test.  Sexually transmitted disease (STD) testing.  Diabetes screening. This is done by checking your blood sugar (glucose) after you have not eaten for a while (fasting). You may have this done every 1-3 years.  Bone density scan. This is done to screen for osteoporosis. You may have this done starting at age 9.  Mammogram. This may be done every 1-2 years. Talk to your health care provider about how often you should have regular mammograms. Talk with your health care provider about your test results, treatment options, and if necessary, the need for more tests. Vaccines  Your health care provider may recommend certain vaccines, such as:  Influenza vaccine. This is recommended every year.  Tetanus, diphtheria, and acellular pertussis (Tdap, Td) vaccine. You may  need a Td booster every 10 years.  Zoster vaccine. You may need this after age 93.  Pneumococcal 13-valent conjugate (PCV13) vaccine. One dose is recommended after age 18.  Pneumococcal polysaccharide (PPSV23) vaccine. One dose is recommended after  age 59. Talk to your health care provider about which screenings and vaccines you need and how often you need them. This information is not intended to replace advice given to you by your health care provider. Make sure you discuss any questions you have with your health care provider. Document Released: 01/20/2015 Document Revised: 09/13/2015 Document Reviewed: 10/25/2014 Elsevier Interactive Patient Education  2017 [place] Prevention in the Home Falls can cause injuries. They can happen to people of all ages. There are many things you can do to make your home safe and to help prevent falls. What can I do on the outside of my home?  Regularly fix the edges of walkways and driveways and fix any cracks.  Remove anything that might make you trip as you walk through a door, such as a raised step or threshold.  Trim any bushes or trees on the path to your home.  Use bright outdoor lighting.  Clear any walking paths of anything that might make someone trip, such as rocks or tools.  Regularly check to see if handrails are loose or broken. Make sure that both sides of any steps have handrails.  Any raised decks and porches should have guardrails on the edges.  Have any leaves, snow, or ice cleared regularly.  Use sand or salt on walking paths during winter.  Clean up any spills in your garage right away. This includes oil or grease spills. What can I do in the bathroom?  Use night lights.  Install grab bars by the toilet and in the tub and shower. Do not use towel bars as grab bars.  Use non-skid mats or decals in the tub or shower.  If you need to sit down in the shower, use a plastic, non-slip stool.  Keep the floor dry. Clean up any water that spills on the floor as soon as it happens.  Remove soap buildup in the tub or shower regularly.  Attach bath mats securely with double-sided non-slip rug tape.  Do not have throw rugs and other things on the floor that can  make you trip. What can I do in the bedroom?  Use night lights.  Make sure that you have a light by your bed that is easy to reach.  Do not use any sheets or blankets that are too big for your bed. They should not hang down onto the floor.  Have a firm chair that has side arms. You can use this for support while you get dressed.  Do not have throw rugs and other things on the floor that can make you trip. What can I do in the kitchen?  Clean up any spills right away.  Avoid walking on wet floors.  Keep items that you use a lot in easy-to-reach places.  If you need to reach something above you, use a strong step stool that has a grab bar.  Keep electrical cords out of the way.  Do not use floor polish or wax that makes floors slippery. If you must use wax, use non-skid floor wax.  Do not have throw rugs and other things on the floor that can make you trip. What can I do with my stairs?  Do not leave any items on  the stairs.  Make sure that there are handrails on both sides of the stairs and use them. Fix handrails that are broken or loose. Make sure that handrails are as long as the stairways.  Check any carpeting to make sure that it is firmly attached to the stairs. Fix any carpet that is loose or worn.  Avoid having throw rugs at the top or bottom of the stairs. If you do have throw rugs, attach them to the floor with carpet tape.  Make sure that you have a light switch at the top of the stairs and the bottom of the stairs. If you do not have them, ask someone to add them for you. What else can I do to help prevent falls?  Wear shoes that:  Do not have high heels.  Have rubber bottoms.  Are comfortable and fit you well.  Are closed at the toe. Do not wear sandals.  If you use a stepladder:  Make sure that it is fully opened. Do not climb a closed stepladder.  Make sure that both sides of the stepladder are locked into place.  Ask someone to hold it for you,  if possible.  Clearly mark and make sure that you can see:  Any grab bars or handrails.  First and last steps.  Where the edge of each step is.  Use tools that help you move around (mobility aids) if they are needed. These include:  Canes.  Walkers.  Scooters.  Crutches.  Turn on the lights when you go into a dark area. Replace any light bulbs as soon as they burn out.  Set up your furniture so you have a clear path. Avoid moving your furniture around.  If any of your floors are uneven, fix them.  If there are any pets around you, be aware of where they are.  Review your medicines with your doctor. Some medicines can make you feel dizzy. This can increase your chance of falling. Ask your doctor what other things that you can do to help prevent falls. This information is not intended to replace advice given to you by your health care provider. Make sure you discuss any questions you have with your health care provider. Document Released: 10/20/2008 Document Revised: 06/01/2015 Document Reviewed: 01/28/2014 Elsevier Interactive Patient Education  2017 [employer].

## 2016-07-28 ENCOUNTER — Encounter: Payer: Self-pay | Admitting: Internal Medicine

## 2016-07-28 NOTE — Assessment & Plan Note (Signed)
Most recent BUN/creatinine is 41/1.3 which is stable from prior; no recent GFR; we will monitor intervals

## 2016-07-28 NOTE — Assessment & Plan Note (Signed)
Stable; continue Synthroid 25 g daily

## 2016-07-28 NOTE — Assessment & Plan Note (Signed)
Stable and chronic without major decline; continue supportive care

## 2016-08-14 DIAGNOSIS — F0390 Unspecified dementia without behavioral disturbance: Secondary | ICD-10-CM | POA: Diagnosis not present

## 2016-08-14 DIAGNOSIS — F419 Anxiety disorder, unspecified: Secondary | ICD-10-CM | POA: Diagnosis not present

## 2016-08-14 DIAGNOSIS — F29 Unspecified psychosis not due to a substance or known physiological condition: Secondary | ICD-10-CM | POA: Diagnosis not present

## 2016-08-14 DIAGNOSIS — G4700 Insomnia, unspecified: Secondary | ICD-10-CM | POA: Diagnosis not present

## 2016-08-16 ENCOUNTER — Encounter: Payer: Self-pay | Admitting: Internal Medicine

## 2016-08-16 ENCOUNTER — Non-Acute Institutional Stay (SKILLED_NURSING_FACILITY): Payer: Medicare Other | Admitting: Internal Medicine

## 2016-08-16 DIAGNOSIS — E034 Atrophy of thyroid (acquired): Secondary | ICD-10-CM | POA: Diagnosis not present

## 2016-08-16 DIAGNOSIS — I10 Essential (primary) hypertension: Secondary | ICD-10-CM | POA: Diagnosis not present

## 2016-08-16 DIAGNOSIS — F39 Unspecified mood [affective] disorder: Secondary | ICD-10-CM | POA: Diagnosis not present

## 2016-08-16 NOTE — Progress Notes (Signed)
Location:  [facility] Room Number: [phone] Place of Service:  SNF [phone])  [name], MD  Patient Care Team: [name], MD as PCP - General (Internal Medicine)  Extended Emergency Contact Information Primary Emergency Contact: [facility] Address: [address]          [place], [place] [address] Johnnette Litter of [place] Phone: [phone] Relation: Son Secondary Emergency Contact: [place], [place] [address] Johnnette Litter of [place] Phone: [phone] Relation: Son    Allergies: Patient has no known allergies.  Chief Complaint  Patient presents with  . Medical Management of Chronic Issues    routine visit    HPI: Patient is 83 y.o. female who Is being seen for routine issues of mood disorder, hypothyroidism, and hypertension.  Past Medical History:  Diagnosis Date  . Acute encephalopathy 03/29/2015  . Cardiac arrhythmia 12/13/2012   Per pt, takes atenolol, pradaxa. asymptomatic.   Marland Kitchen Chronic atrial fibrillation ([place])   . Chronic renal insufficiency, stage III (moderate)   . CKD (chronic kidney disease) stage 3, GFR 30-59 ml/min 09/17/2015  . Closed fracture of neck of left femur ([place])   . Confusion 06/20/2015  . Constipation   . CVA (cerebral infarction) approx 2012   residual defect: poor R sided peripheral vision per son  . Dementia with behavioral disturbance 11/08/2015  . Depression   . Femoral neck fracture ([place]) 12/01/2015  . Hip fracture ([place]) 03/20/2015  . History of depression 12/11/2012   Pt reports 40 pound weight loss. No current Tx.   Marland Kitchen History of fracture of right hip 03/2015  . Hyperlipidemia   . Hypertension   . Hypothyroidism   . Insomnia   . Osteoarthritis    wrists, hands  . Osteoporosis   . Urinary incontinence 12/13/2012  . Vitamin D deficiency 03/29/2015    Past Surgical History:  Procedure Laterality Date  . APPENDECTOMY    . COLONOSCOPY  08/22/08  . INTRAMEDULLARY (IM)  NAIL INTERTROCHANTERIC Right 03/22/2015   Procedure: RIGHT femoral HIP NAILING;  Surgeon: Gaynelle Arabian, MD;  Location: [facility];  Service: Orthopedics;  Laterality: Right;  . KNEE ARTHROSCOPY Right   . THYROID SURGERY      Allergies as of 08/16/2016   No Known Allergies     Medication List       Accurate as of 08/16/16 11:59 PM. Always use your most recent med list.          acetaminophen 500 MG tablet Commonly known as:  TYLENOL Take 2 tablets (1,000 mg total) by mouth every 6 (six) hours as needed for mild pain, moderate pain or headache.   atenolol 50 MG tablet Commonly known as:  TENORMIN Take 50 mg by mouth daily.   ELIQUIS 2.5 MG Tabs tablet Generic drug:  apixaban Take 2.5 mg by mouth 2 (two) times daily. 9am and 5pm   feeding supplement Liqd Take 1 Container by mouth 3 (three) times daily between meals.   levothyroxine 25 MCG tablet Commonly known as:  SYNTHROID, LEVOTHROID Take 25 mcg by mouth daily before breakfast.   ondansetron 4 MG disintegrating tablet Commonly known as:  ZOFRAN-ODT Take 4 mg by mouth every 8 (eight) hours as needed for nausea or vomiting.   pravastatin 40 MG tablet Commonly known as:  PRAVACHOL Take 1 tablet (40 mg total) by mouth at bedtime.   QUEtiapine 50 MG tablet Commonly known as:  SEROQUEL Take 50 mg by mouth at bedtime.   senna 8.6 MG tablet Commonly known as:  SENOKOT Take 1 tablet by mouth at bedtime as needed for constipation.   tizanidine 2 MG capsule Commonly known as:  ZANAFLEX Take 2 mg by mouth every 8 (eight) hours.       No orders of the defined types were placed in this encounter.   Immunization History  Administered Date(s) Administered  . Influenza Split 11/20/2012  . Influenza-Unspecified 10/07/2015  . PPD Test 08/14/2015  . Pneumococcal Conjugate-13 05/22/2015  . Pneumococcal Polysaccharide-23 08/22/2008  . Tdap 01/01/2015, 08/06/2015, 01/10/2016    Social History  Substance Use Topics  .  Smoking status: Never Smoker  . Smokeless tobacco: Never Used  . Alcohol use No    Review of SystemsUnable to obtain secondary to dementia; nursing-no concerns    Vitals:   08/16/16 1128  BP: 115/80  Pulse: (!) 59  Resp: 20  Temp: 98.6 F (37 C)   Body mass index is 20.53 kg/m. Physical Exam  GENERAL APPEARANCE: Alert, conversant, No acute distress  SKIN: No diaphoresis rash HEENT: Unremarkable RESPIRATORY: Breathing is even, unlabored. Lung sounds are clear   CARDIOVASCULAR: Heart RRR no murmurs, rubs or gallops. No peripheral edema  GASTROINTESTINAL: Abdomen is soft, non-tender, not distended w/ normal bowel sounds.  GENITOURINARY: Bladder non tender, not distended  MUSCULOSKELETAL: No abnormal joints or musculature NEUROLOGIC: Cranial nerves 2-12 grossly intact. Moves all extremities PSYCHIATRIC: Mood and affectWith dementia, no behavioral issues  PE has not changed since prior exam.  Patient Active Problem List   Diagnosis Date Noted  . Failure to thrive in adult 07/14/2016  . Femoral neck fracture ([place]) 12/01/2015  . Malnutrition of moderate degree 12/01/2015  . Closed fracture of neck of left femur ([place])   . Mood disorder ([place]) 11/19/2015  . Dementia with behavioral disturbance 11/08/2015  . CKD (chronic kidney disease) stage 3, GFR 30-59 ml/min 09/17/2015  . Insomnia 08/18/2015  . Confusion 06/20/2015  . Recent urinary tract infection 06/20/2015  . Dysuria 04/10/2015  . Edema 04/06/2015  . Acute encephalopathy 03/29/2015  . Vitamin D deficiency 03/29/2015  . UTI (lower urinary tract infection) 03/21/2015  . Hip fracture ([place]) 03/20/2015  . Chronic atrial fibrillation ([place]) 02/20/2015  . Hyperlipidemia 02/20/2015  . Essential hypertension, benign 12/13/2012  . Cardiac arrhythmia 12/13/2012  . Urinary incontinence 12/13/2012  . Hypothyroidism 12/13/2012  . History of depression 12/11/2012    CMP     Component Value Date/Time   NA 141 02/20/2016    K 4.7 02/20/2016   CL 103 01/10/2016 0030   CO2 23 01/10/2016 0030   GLUCOSE 111 (H) 01/10/2016 0030   BUN 41 (A) 05/24/2016   CREATININE 1.3 (A) 05/24/2016   CREATININE 1.26 (H) 01/10/2016 0030   CALCIUM 8.6 (L) 01/10/2016 0030   PROT 6.9 12/01/2015 0506   ALBUMIN 3.6 12/01/2015 0506   AST 18 02/20/2016   ALT 10 02/20/2016   ALKPHOS 101 02/20/2016   BILITOT 0.6 12/01/2015 0506   GFRNONAA 36 (L) 01/10/2016 0030   GFRAA 41 (L) 01/10/2016 0030    Recent Labs  12/01/15 0029 12/01/15 0506 12/08/15 01/10/16 01/10/16 0030 02/20/16 05/24/16  NA 141  141 141  141 143 139 139 141  --   K 4.4  4.4 4.2 4.2  --  3.9 4.7  --   CL 109 108  --   --  103  --   --   CO2  --  24  --   --  23  --   --   GLUCOSE 111* 175*  --   --  111*  --   --   BUN 46*  46* 40*  40* 57* 31* 31* 33* 41*  CREATININE 1.70*  1.7* 1.64*  1.6* 1.6* 1.3* 1.26* 1.1 1.3*  CALCIUM  --  9.1  --   --  8.6*  --   --     Recent Labs  12/01/15 0506 02/20/16  AST 24 18  ALT 16 10  ALKPHOS 75 101  BILITOT 0.6  --   PROT 6.9  --   ALBUMIN 3.6  --     Recent Labs  12/01/15 0111 12/01/15 0506  12/02/15 0429 01/10/16 01/10/16 0030  WBC 10.0  10.0 12.4  12.4*  < > 11.1* 7.6 7.6  NEUTROABS 7.8* 11.0*  --   --   --  5.4  HGB 11.2*  11.2* 11.9*  11.9*  --  11.6*  --  11.3*  HCT 35.1*  35* 36.6  37  --  35.9*  --  34.8*  MCV 90.2 89.3  --  88.9  --  88.8  PLT 242  242 248  248  --  227  --  306  < > = values in this interval not displayed.  Recent Labs  02/20/16  CHOL 136  LDLCALC 60  TRIG 98   No results found for: [facility] Lab Results  Component Value Date   TSH 0.83 02/20/2016   Lab Results  Component Value Date   HGBA1C 5.4 02/20/2016   Lab Results  Component Value Date   CHOL 136 02/20/2016   HDL 57 02/20/2016   LDLCALC 60 02/20/2016   TRIG 98 02/20/2016    Significant Diagnostic Results in last 30 days:  No results found.  Assessment and Plan  Mood disorder  ([place]) Chronic and stable; plan to continue Seroquel 25 mg by mouth every morning and 50 mg by mouth daily at bedtime  Hypothyroidism TSH is 0.83, stable; continue Synthroid 25 g daily  Essential hypertension, benign Controlled; continue atenolol 50 mg by mouth daily    Anne D. [name], MD

## 2016-08-31 ENCOUNTER — Encounter: Payer: Self-pay | Admitting: Internal Medicine

## 2016-08-31 NOTE — Assessment & Plan Note (Signed)
Controlled; continue atenolol 50 mg by mouth daily

## 2016-08-31 NOTE — Assessment & Plan Note (Signed)
Chronic and stable; plan to continue Seroquel 25 mg by mouth every morning and 50 mg by mouth daily at bedtime

## 2016-08-31 NOTE — Assessment & Plan Note (Signed)
TSH is 0.83, stable; continue Synthroid 25 g daily

## 2016-09-16 ENCOUNTER — Encounter: Payer: Self-pay | Admitting: Internal Medicine

## 2016-09-16 ENCOUNTER — Non-Acute Institutional Stay (SKILLED_NURSING_FACILITY): Payer: Medicare Other | Admitting: Internal Medicine

## 2016-09-16 DIAGNOSIS — G301 Alzheimer's disease with late onset: Secondary | ICD-10-CM | POA: Diagnosis not present

## 2016-09-16 DIAGNOSIS — N183 Chronic kidney disease, stage 3 unspecified: Secondary | ICD-10-CM

## 2016-09-16 DIAGNOSIS — F0281 Dementia in other diseases classified elsewhere with behavioral disturbance: Secondary | ICD-10-CM | POA: Diagnosis not present

## 2016-09-16 DIAGNOSIS — E785 Hyperlipidemia, unspecified: Secondary | ICD-10-CM | POA: Diagnosis not present

## 2016-09-16 NOTE — Progress Notes (Signed)
Location:  [facility] Room Number: [place]:  SNF [phone])  [name], MD  Patient Care Team: [name], MD as PCP - General (Internal Medicine)  Extended Emergency Contact Information Primary Emergency Contact: [facility] Address: [address]          [place], [place] [address] Johnnette Litter of [place] Phone: [phone] Relation: Son Secondary Emergency Contact: [place], [place] [address] Johnnette Litter of [place] Phone: [phone] Relation: Son    Allergies: Patient has no known allergies.  Chief Complaint  Patient presents with  . Medical Management of Chronic Issues    routine visit    HPI: Patient is 83 y.o. female who Is being seen for routine issues of hyperlipidemia, chronic kidney disease stage III, and dementia.  Past Medical History:  Diagnosis Date  . Acute encephalopathy 03/29/2015  . Cardiac arrhythmia 12/13/2012   Per pt, takes atenolol, pradaxa. asymptomatic.   Marland Kitchen Chronic atrial fibrillation ([place])   . Chronic renal insufficiency, stage III (moderate) (HCC)   . CKD (chronic kidney disease) stage 3, GFR 30-59 ml/min (HCC) 09/17/2015  . Closed fracture of neck of left femur ([place])   . Confusion 06/20/2015  . Constipation   . CVA (cerebral infarction) approx 2012   residual defect: poor R sided peripheral vision per son  . Dementia with behavioral disturbance 11/08/2015  . Depression   . Femoral neck fracture ([place]) 12/01/2015  . Hip fracture ([place]) 03/20/2015  . History of depression 12/11/2012   Pt reports 40 pound weight loss. No current Tx.   Marland Kitchen History of fracture of right hip 03/2015  . Hyperlipidemia   . Hypertension   . Hypothyroidism   . Insomnia   . Osteoarthritis    wrists, hands  . Osteoporosis   . Urinary incontinence 12/13/2012  . Vitamin D deficiency 03/29/2015    Past Surgical History:  Procedure Laterality Date  . APPENDECTOMY    . COLONOSCOPY  08/22/08    . INTRAMEDULLARY (IM) NAIL INTERTROCHANTERIC Right 03/22/2015   Procedure: RIGHT femoral HIP NAILING;  Surgeon: Gaynelle Arabian, MD;  Location: [facility];  Service: Orthopedics;  Laterality: Right;  . KNEE ARTHROSCOPY Right   . THYROID SURGERY      Allergies as of 09/16/2016   No Known Allergies     Medication List       Accurate as of 09/16/16 11:59 PM. Always use your most recent med list.          acetaminophen 500 MG tablet Commonly known as:  TYLENOL Take 2 tablets (1,000 mg total) by mouth every 6 (six) hours as needed for mild pain, moderate pain or headache.   atenolol 50 MG tablet Commonly known as:  TENORMIN Take 50 mg by mouth daily.   ELIQUIS 2.5 MG Tabs tablet Generic drug:  apixaban Take 2.5 mg by mouth 2 (two) times daily. 9am and 5pm   feeding supplement Liqd Take 1 Container by mouth 3 (three) times daily between meals.   levothyroxine 25 MCG tablet Commonly known as:  SYNTHROID, LEVOTHROID Take 25 mcg by mouth daily before breakfast.   ondansetron 4 MG disintegrating tablet Commonly known as:  ZOFRAN-ODT Take 4 mg by mouth every 8 (eight) hours as needed for nausea or vomiting.   pravastatin 40 MG tablet Commonly known as:  PRAVACHOL Take 1 tablet (40 mg total) by mouth at bedtime.   QUEtiapine  50 MG tablet Commonly known as:  SEROQUEL Take 50 mg by mouth at bedtime.   senna 8.6 MG tablet Commonly known as:  SENOKOT Take 1 tablet by mouth at bedtime as needed for constipation.   tizanidine 2 MG capsule Commonly known as:  ZANAFLEX Take 2 mg by mouth every 8 (eight) hours.       No orders of the defined types were placed in this encounter.   Immunization History  Administered Date(s) Administered  . Influenza Split 11/20/2012  . Influenza-Unspecified 10/07/2015  . PPD Test 08/14/2015  . Pneumococcal Conjugate-13 05/22/2015  . Pneumococcal Polysaccharide-23 08/22/2008  . Tdap 01/01/2015, 08/06/2015, 01/10/2016    Social History   Substance Use Topics  . Smoking status: Never Smoker  . Smokeless tobacco: Never Used  . Alcohol use No    Review of SystemsUnable to obtain secondary to dementia; nursing-no concerns    Vitals:   09/16/16 1621  BP: 113/75  Pulse: 80  Resp: 18  Temp: (!) 97.2 F (36.2 C)   Body mass index is 20.08 kg/m. Physical Exam  GENERAL APPEARANCE: Alert, conversant, No acute distress  SKIN: No diaphoresis rash HEENT: Unremarkable RESPIRATORY: Breathing is even, unlabored. Lung sounds are clear   CARDIOVASCULAR: Heart RRR no murmurs, rubs or gallops. No peripheral edema  GASTROINTESTINAL: Abdomen is soft, non-tender, not distended w/ normal bowel sounds.  GENITOURINARY: Bladder non tender, not distended  MUSCULOSKELETAL: No abnormal joints or musculature NEUROLOGIC: Cranial nerves 2-12 grossly intact. Moves all extremities PSYCHIATRIC: Mood and affect With dementia, no behaviors Patient Active Problem List   Diagnosis Date Noted  . Failure to thrive in adult 07/14/2016  . Femoral neck fracture ([place]) 12/01/2015  . Malnutrition of moderate degree 12/01/2015  . Closed fracture of neck of left femur ([place])   . Mood disorder ([place]) 11/19/2015  . Dementia with behavioral disturbance 11/08/2015  . CKD (chronic kidney disease) stage 3, GFR 30-59 ml/min (HCC) 09/17/2015  . Insomnia 08/18/2015  . Confusion 06/20/2015  . Recent urinary tract infection 06/20/2015  . Dysuria 04/10/2015  . Edema 04/06/2015  . Acute encephalopathy 03/29/2015  . Vitamin D deficiency 03/29/2015  . UTI (lower urinary tract infection) 03/21/2015  . Hip fracture ([place]) 03/20/2015  . Chronic atrial fibrillation ([place]) 02/20/2015  . Hyperlipidemia 02/20/2015  . Essential hypertension, benign 12/13/2012  . Cardiac arrhythmia 12/13/2012  . Urinary incontinence 12/13/2012  . Hypothyroidism 12/13/2012  . History of depression 12/11/2012    CMP     Component Value Date/Time   NA 141 02/20/2016   K 4.7  02/20/2016   CL 103 01/10/2016 0030   CO2 23 01/10/2016 0030   GLUCOSE 111 (H) 01/10/2016 0030   BUN 41 (A) 05/24/2016   CREATININE 1.3 (A) 05/24/2016   CREATININE 1.26 (H) 01/10/2016 0030   CALCIUM 8.6 (L) 01/10/2016 0030   PROT 6.9 12/01/2015 0506   ALBUMIN 3.6 12/01/2015 0506   AST 18 02/20/2016   ALT 10 02/20/2016   ALKPHOS 101 02/20/2016   BILITOT 0.6 12/01/2015 0506   GFRNONAA 36 (L) 01/10/2016 0030   GFRAA 41 (L) 01/10/2016 0030    Recent Labs  12/01/15 0029 12/01/15 0506 12/08/15 01/10/16 01/10/16 0030 02/20/16 05/24/16  NA 141  141 141  141 143 139 139 141  --   K 4.4  4.4 4.2 4.2  --  3.9 4.7  --   CL 109 108  --   --  103  --   --   CO2  --  24  --   --  23  --   --   GLUCOSE 111* 175*  --   --  111*  --   --   BUN 46*  46* 40*  40* 57* 31* 31* 33* 41*  CREATININE 1.70*  1.7* 1.64*  1.6* 1.6* 1.3* 1.26* 1.1 1.3*  CALCIUM  --  9.1  --   --  8.6*  --   --     Recent Labs  12/01/15 0506 02/20/16  AST 24 18  ALT 16 10  ALKPHOS 75 101  BILITOT 0.6  --   PROT 6.9  --   ALBUMIN 3.6  --     Recent Labs  12/01/15 0111 12/01/15 0506  12/02/15 0429 01/10/16 01/10/16 0030  WBC 10.0  10.0 12.4  12.4*  < > 11.1* 7.6 7.6  NEUTROABS 7.8* 11.0*  --   --   --  5.4  HGB 11.2*  11.2* 11.9*  11.9*  --  11.6*  --  11.3*  HCT 35.1*  35* 36.6  37  --  35.9*  --  34.8*  MCV 90.2 89.3  --  88.9  --  88.8  PLT 242  242 248  248  --  227  --  306  < > = values in this interval not displayed.  Recent Labs  02/20/16  CHOL 136  LDLCALC 60  TRIG 98   No results found for: [facility] Lab Results  Component Value Date   TSH 0.83 02/20/2016   Lab Results  Component Value Date   HGBA1C 5.4 02/20/2016   Lab Results  Component Value Date   CHOL 136 02/20/2016   HDL 57 02/20/2016   LDLCALC 60 02/20/2016   TRIG 98 02/20/2016    Significant Diagnostic Results in last 30 days:  No results found.  Assessment and Plan  Hyperlipidemia We'll  controlled; continue Pravachol 40 mg by mouth daily  CKD (chronic kidney disease) stage 3, GFR 30-59 ml/min Lisinopril most recent creatinine 1.1 which is stable from prior; plan to monitor intervals  Dementia with behavioral disturbance Chronic and stable without major decline; continue supportive care    [place] D. [name], MD

## 2016-09-19 DIAGNOSIS — F0390 Unspecified dementia without behavioral disturbance: Secondary | ICD-10-CM | POA: Diagnosis not present

## 2016-09-19 DIAGNOSIS — G4700 Insomnia, unspecified: Secondary | ICD-10-CM | POA: Diagnosis not present

## 2016-09-19 DIAGNOSIS — F419 Anxiety disorder, unspecified: Secondary | ICD-10-CM | POA: Diagnosis not present

## 2016-09-19 DIAGNOSIS — F29 Unspecified psychosis not due to a substance or known physiological condition: Secondary | ICD-10-CM | POA: Diagnosis not present

## 2016-10-15 ENCOUNTER — Encounter: Payer: Self-pay | Admitting: Internal Medicine

## 2016-10-15 ENCOUNTER — Non-Acute Institutional Stay (SKILLED_NURSING_FACILITY): Payer: Medicare Other | Admitting: Internal Medicine

## 2016-10-15 DIAGNOSIS — I482 Chronic atrial fibrillation, unspecified: Secondary | ICD-10-CM

## 2016-10-15 DIAGNOSIS — F39 Unspecified mood [affective] disorder: Secondary | ICD-10-CM

## 2016-10-15 DIAGNOSIS — I10 Essential (primary) hypertension: Secondary | ICD-10-CM | POA: Diagnosis not present

## 2016-10-15 NOTE — Progress Notes (Signed)
Location:  [place] [facility] Room Number: [phone] Place of Service:  SNF [id number])  [name], MD  Patient Care Team: [name], MD as PCP - General (Internal Medicine)  Extended Emergency Contact Information Primary Emergency Contact: [facility] Address: [address]          [place], [place] [address] Johnnette Litter of [place] Phone: [phone] Relation: Son Secondary Emergency Contact: [place], [place] [address] Johnnette Litter of [place] Phone: [phone] Relation: Son    Allergies: Patient has no known allergies.  Chief Complaint  Patient presents with  . Medical Management of Chronic Issues    routine visit    HPI: Patient is 83 y.o. female who Is being seen for routine issues of atrial fibrillation, mood disorder, and hypertension.  Past Medical History:  Diagnosis Date  . Acute encephalopathy 03/29/2015  . Cardiac arrhythmia 12/13/2012   Per pt, takes atenolol, pradaxa. asymptomatic.   Marland Kitchen Chronic atrial fibrillation ([place])   . Chronic renal insufficiency, stage III (moderate) (HCC)   . CKD (chronic kidney disease) stage 3, GFR 30-59 ml/min (HCC) 09/17/2015  . Closed fracture of neck of left femur ([place])   . Confusion 06/20/2015  . Constipation   . CVA (cerebral infarction) approx 2012   residual defect: poor R sided peripheral vision per son  . Dementia with behavioral disturbance 11/08/2015  . Depression   . Femoral neck fracture ([place]) 12/01/2015  . Hip fracture ([place]) 03/20/2015  . History of depression 12/11/2012   Pt reports 40 pound weight loss. No current Tx.   Marland Kitchen History of fracture of right hip 03/2015  . Hyperlipidemia   . Hypertension   . Hypothyroidism   . Insomnia   . Osteoarthritis    wrists, hands  . Osteoporosis   . Urinary incontinence 12/13/2012  . Vitamin D deficiency 03/29/2015    Past Surgical History:  Procedure Laterality Date  . APPENDECTOMY    . COLONOSCOPY  08/22/08  .  INTRAMEDULLARY (IM) NAIL INTERTROCHANTERIC Right 03/22/2015   Procedure: RIGHT femoral HIP NAILING;  Surgeon: Gaynelle Arabian, MD;  Location: [facility];  Service: Orthopedics;  Laterality: Right;  . KNEE ARTHROSCOPY Right   . THYROID SURGERY      Allergies as of 10/15/2016   No Known Allergies     Medication List       Accurate as of 10/15/16 11:59 PM. Always use your most recent med list.          acetaminophen 500 MG tablet Commonly known as:  TYLENOL Take 2 tablets (1,000 mg total) by mouth every 6 (six) hours as needed for mild pain, moderate pain or headache.   atenolol 50 MG tablet Commonly known as:  TENORMIN Take 50 mg by mouth daily.   ELIQUIS 2.5 MG Tabs tablet Generic drug:  apixaban Take 2.5 mg by mouth 2 (two) times daily. 9am and 5pm   feeding supplement Liqd Take 1 Container by mouth 3 (three) times daily between meals.   levothyroxine 25 MCG tablet Commonly known as:  SYNTHROID, LEVOTHROID Take 25 mcg by mouth daily before breakfast.   ondansetron 4 MG disintegrating tablet Commonly known as:  ZOFRAN-ODT Take 4 mg by mouth every 8 (eight) hours as needed for nausea or vomiting.   pravastatin 40 MG tablet Commonly known as:  PRAVACHOL Take 1 tablet (40 mg total) by mouth at bedtime.   QUEtiapine 50 MG tablet  Commonly known as:  SEROQUEL Take 50 mg by mouth at bedtime.   senna 8.6 MG tablet Commonly known as:  SENOKOT Take 1 tablet by mouth at bedtime as needed for constipation.   tizanidine 2 MG capsule Commonly known as:  ZANAFLEX Take 2 mg by mouth every 8 (eight) hours.       No orders of the defined types were placed in this encounter.   Immunization History  Administered Date(s) Administered  . Influenza Split 11/20/2012  . Influenza-Unspecified 10/07/2015  . PPD Test 08/14/2015  . Pneumococcal Conjugate-13 05/22/2015  . Pneumococcal Polysaccharide-23 08/22/2008  . Tdap 01/01/2015, 08/06/2015, 01/10/2016    Social History    Substance Use Topics  . Smoking status: Never Smoker  . Smokeless tobacco: Never Used  . Alcohol use No    Review of SystemsUnable to obtain secondary to dementia; nursing-no concerns    Vitals:   10/15/16 1131  BP: (!) 122/58  Pulse: 66  Resp: 16  Temp: (!) 97.3 F (36.3 C)   Body mass index is 19.4 kg/m. Physical Exam  GENERAL APPEARANCE: Alert, conversant, No acute distress  SKIN: No diaphoresis rash HEENT: Unremarkable RESPIRATORY: Breathing is even, unlabored. Lung sounds are clear   CARDIOVASCULAR: Heart RRR no murmurs, rubs or gallops. No peripheral edema  GASTROINTESTINAL: Abdomen is soft, non-tender, not distended w/ normal bowel sounds.  GENITOURINARY: Bladder non tender, not distended  MUSCULOSKELETAL: No abnormal joints or musculature NEUROLOGIC: Cranial nerves 2-12 grossly intact. Moves all extremities PSYCHIATRIC: Mood and affect With dementia, no behavioral issues  Patient Active Problem List   Diagnosis Date Noted  . Failure to thrive in adult 07/14/2016  . Femoral neck fracture ([place]) 12/01/2015  . Malnutrition of moderate degree 12/01/2015  . Closed fracture of neck of left femur ([place])   . Mood disorder ([place]) 11/19/2015  . Dementia with behavioral disturbance 11/08/2015  . CKD (chronic kidney disease) stage 3, GFR 30-59 ml/min (HCC) 09/17/2015  . Insomnia 08/18/2015  . Confusion 06/20/2015  . Recent urinary tract infection 06/20/2015  . Dysuria 04/10/2015  . Edema 04/06/2015  . Acute encephalopathy 03/29/2015  . Vitamin D deficiency 03/29/2015  . UTI (lower urinary tract infection) 03/21/2015  . Hip fracture ([place]) 03/20/2015  . Chronic atrial fibrillation ([place]) 02/20/2015  . Hyperlipidemia 02/20/2015  . Essential hypertension, benign 12/13/2012  . Cardiac arrhythmia 12/13/2012  . Urinary incontinence 12/13/2012  . Hypothyroidism 12/13/2012  . History of depression 12/11/2012    CMP     Component Value Date/Time   NA 141 02/20/2016   K  4.7 02/20/2016   CL 103 01/10/2016 0030   CO2 23 01/10/2016 0030   GLUCOSE 111 (H) 01/10/2016 0030   BUN 41 (A) 05/24/2016   CREATININE 1.3 (A) 05/24/2016   CREATININE 1.26 (H) 01/10/2016 0030   CALCIUM 8.6 (L) 01/10/2016 0030   PROT 6.9 12/01/2015 0506   ALBUMIN 3.6 12/01/2015 0506   AST 18 02/20/2016   ALT 10 02/20/2016   ALKPHOS 101 02/20/2016   BILITOT 0.6 12/01/2015 0506   GFRNONAA 36 (L) 01/10/2016 0030   GFRAA 41 (L) 01/10/2016 0030    Recent Labs  12/01/15 0029 12/01/15 0506 12/08/15 01/10/16 01/10/16 0030 02/20/16 05/24/16  NA 141  141 141  141 143 139 139 141  --   K 4.4  4.4 4.2 4.2  --  3.9 4.7  --   CL 109 108  --   --  103  --   --   CO2  --  24  --   --  23  --   --   GLUCOSE 111* 175*  --   --  111*  --   --   BUN 46*  46* 40*  40* 57* 31* 31* 33* 41*  CREATININE 1.70*  1.7* 1.64*  1.6* 1.6* 1.3* 1.26* 1.1 1.3*  CALCIUM  --  9.1  --   --  8.6*  --   --     Recent Labs  12/01/15 0506 02/20/16  AST 24 18  ALT 16 10  ALKPHOS 75 101  BILITOT 0.6  --   PROT 6.9  --   ALBUMIN 3.6  --     Recent Labs  12/01/15 0111 12/01/15 0506  12/02/15 0429 01/10/16 01/10/16 0030  WBC 10.0  10.0 12.4  12.4*  < > 11.1* 7.6 7.6  NEUTROABS 7.8* 11.0*  --   --   --  5.4  HGB 11.2*  11.2* 11.9*  11.9*  --  11.6*  --  11.3*  HCT 35.1*  35* 36.6  37  --  35.9*  --  34.8*  MCV 90.2 89.3  --  88.9  --  88.8  PLT 242  242 248  248  --  227  --  306  < > = values in this interval not displayed.  Recent Labs  02/20/16  CHOL 136  LDLCALC 60  TRIG 98   No results found for: [facility] Lab Results  Component Value Date   TSH 0.83 02/20/2016   Lab Results  Component Value Date   HGBA1C 5.4 02/20/2016   Lab Results  Component Value Date   CHOL 136 02/20/2016   HDL 57 02/20/2016   LDLCALC 60 02/20/2016   TRIG 98 02/20/2016    Significant Diagnostic Results in last 30 days:  No results found.  Assessment and Plan  Chronic atrial  fibrillation (HCC) Chronic and stable; plan to continue Tenormin 50 mg by mouth daily and Eliquis 2.5 mg by mouth twice a day  Mood disorder (HCC) Chronic and stable; continue Seroquel 25 mg by mouth every morning and 50 mg by mouth daily at bedtime  Essential hypertension, benign Control on Tenormin 50 mg by mouth daily; plan to continue current medication    Webb Silversmith D. [name], MD

## 2016-10-17 ENCOUNTER — Encounter: Payer: Self-pay | Admitting: Internal Medicine

## 2016-10-17 NOTE — Assessment & Plan Note (Signed)
Chronic and stable without major decline; continue supportive care

## 2016-10-17 NOTE — Assessment & Plan Note (Signed)
Chronic and stable; plan to continue Tenormin 50 mg by mouth daily and Eliquis 2.5 mg by mouth twice a day

## 2016-10-17 NOTE — Assessment & Plan Note (Signed)
Control on Tenormin 50 mg by mouth daily; plan to continue current medication

## 2016-10-17 NOTE — Assessment & Plan Note (Signed)
Chronic and stable; continue Seroquel 25 mg by mouth every morning and 50 mg by mouth daily at bedtime

## 2016-10-17 NOTE — Assessment & Plan Note (Signed)
We'll controlled; continue Pravachol 40 mg by mouth daily

## 2016-10-17 NOTE — Assessment & Plan Note (Signed)
Lisinopril most recent creatinine 1.1 which is stable from prior; plan to monitor intervals

## 2016-10-28 DIAGNOSIS — F29 Unspecified psychosis not due to a substance or known physiological condition: Secondary | ICD-10-CM | POA: Diagnosis not present

## 2016-10-28 DIAGNOSIS — F419 Anxiety disorder, unspecified: Secondary | ICD-10-CM | POA: Diagnosis not present

## 2016-10-28 DIAGNOSIS — G4700 Insomnia, unspecified: Secondary | ICD-10-CM | POA: Diagnosis not present

## 2016-11-12 DIAGNOSIS — B351 Tinea unguium: Secondary | ICD-10-CM | POA: Diagnosis not present

## 2016-11-12 DIAGNOSIS — I739 Peripheral vascular disease, unspecified: Secondary | ICD-10-CM | POA: Diagnosis not present

## 2016-11-25 ENCOUNTER — Non-Acute Institutional Stay (SKILLED_NURSING_FACILITY): Payer: Medicare Other | Admitting: Internal Medicine

## 2016-11-25 ENCOUNTER — Encounter: Payer: Self-pay | Admitting: Internal Medicine

## 2016-11-25 DIAGNOSIS — E559 Vitamin D deficiency, unspecified: Secondary | ICD-10-CM | POA: Diagnosis not present

## 2016-11-25 DIAGNOSIS — E034 Atrophy of thyroid (acquired): Secondary | ICD-10-CM | POA: Diagnosis not present

## 2016-11-25 DIAGNOSIS — F39 Unspecified mood [affective] disorder: Secondary | ICD-10-CM

## 2016-11-25 NOTE — Progress Notes (Signed)
Location:  [place] Room Number: [place]:  SNF ([phone])  Marie Duos, MD  Patient Care Team: Marie Duos, MD as PCP - General (Internal Medicine)  Extended Emergency Contact Information Primary Emergency Contact: [facility] Address: [address]          [place], [place] [address] Johnnette Litter of [place] Phone: [phone] Relation: Son Secondary Emergency Contact: [place],  [address] Johnnette Litter of [place] Phone: [phone] Relation: Son    Allergies: Patient has no known allergies.  Chief Complaint  Patient presents with  . Medical Management of Chronic Issues    routine visit    HPI: Patient is 83 y.o. female who is being seen for routine issues of hypothyroidism, vitamin D deficiency, and mood disorder.  Past Medical History:  Diagnosis Date  . Acute encephalopathy 03/29/2015  . Cardiac arrhythmia 12/13/2012   Per pt, takes atenolol, pradaxa. asymptomatic.   Marland Kitchen Chronic atrial fibrillation ([place])   . Chronic renal insufficiency, stage III (moderate) (HCC)   . CKD (chronic kidney disease) stage 3, GFR 30-59 ml/min (HCC) 09/17/2015  . Closed fracture of neck of left femur ([place])   . Confusion 06/20/2015  . Constipation   . CVA (cerebral infarction) approx 2012   residual defect: poor R sided peripheral vision per son  . Dementia with behavioral disturbance 11/08/2015  . Depression   . Femoral neck fracture ([place]) 12/01/2015  . Hip fracture ([place]) 03/20/2015  . History of depression 12/11/2012   Pt reports 40 pound weight loss. No current Tx.   Marland Kitchen History of fracture of right hip 03/2015  . Hyperlipidemia   . Hypertension   . Hypothyroidism   . Insomnia   . Osteoarthritis    wrists, hands  . Osteoporosis   . Urinary incontinence 12/13/2012  . Vitamin D deficiency 03/29/2015    Past Surgical History:  Procedure Laterality Date  . APPENDECTOMY    . COLONOSCOPY  08/22/08  .  INTRAMEDULLARY (IM) NAIL INTERTROCHANTERIC Right 03/22/2015   Procedure: RIGHT femoral HIP NAILING;  Surgeon: Gaynelle Arabian, MD;  Location: [facility];  Service: Orthopedics;  Laterality: Right;  . KNEE ARTHROSCOPY Right   . THYROID SURGERY      Allergies as of 11/25/2016   No Known Allergies     Medication List        Accurate as of 11/25/16 11:59 PM. Always use your most recent med list.          acetaminophen 500 MG tablet Commonly known as:  TYLENOL Take 2 tablets (1,000 mg total) by mouth every 6 (six) hours as needed for mild pain, moderate pain or headache.   atenolol 50 MG tablet Commonly known as:  TENORMIN Take 50 mg by mouth daily.   ELIQUIS 2.5 MG Tabs tablet Generic drug:  apixaban Take 2.5 mg by mouth 2 (two) times daily. 9am and 5pm   feeding supplement Liqd Take 1 Container by mouth 3 (three) times daily between meals.   levothyroxine 25 MCG tablet Commonly known as:  SYNTHROID, LEVOTHROID Take 25 mcg by mouth daily before breakfast.   ondansetron 4 MG disintegrating tablet Commonly known as:  ZOFRAN-ODT Take 4 mg by mouth every 8 (eight) hours as needed for nausea or vomiting.   pravastatin 40 MG tablet Commonly known as:  PRAVACHOL Take 1 tablet (40 mg total) by mouth at bedtime.   QUEtiapine 50  MG tablet Commonly known as:  SEROQUEL Take 50 mg by mouth at bedtime.   senna 8.6 MG tablet Commonly known as:  SENOKOT Take 1 tablet by mouth at bedtime as needed for constipation.       No orders of the defined types were placed in this encounter.   Immunization History  Administered Date(s) Administered  . Influenza Split 11/20/2012  . Influenza-Unspecified 10/07/2015, 10/18/2016  . PPD Test 08/14/2015  . Pneumococcal Conjugate-13 05/22/2015  . Pneumococcal Polysaccharide-23 08/22/2008  . Tdap 01/01/2015, 08/06/2015, 01/10/2016    Social History   Tobacco Use  . Smoking status: Never Smoker  . Smokeless tobacco: Never Used  Substance  Use Topics  . Alcohol use: No    Review of Systems  unable to obtain secondary to dementia; nursing with no issues    Vitals:   11/25/16 1453  BP: (!) 145/92  Pulse: 82  Resp: 18  Temp: 98.9 F (37.2 C)  SpO2: 97%   Body mass index is 19.57 kg/m. Physical Exam  GENERAL APPEARANCE: Alert, conversant, No acute distress  SKIN: No diaphoresis rash HEENT: Unremarkable RESPIRATORY: Breathing is even, unlabored. Lung sounds are clear   CARDIOVASCULAR: Heart RRR no murmurs, rubs or gallops. No peripheral edema  GASTROINTESTINAL: Abdomen is soft, non-tender, not distended w/ normal bowel sounds.  GENITOURINARY: Bladder non tender, not distended  MUSCULOSKELETAL: No abnormal joints or musculature NEUROLOGIC: Cranial nerves 2-12 grossly intact. Moves all extremities PSYCHIATRIC: Mood and affect with dementia, no behavioral issues  Patient Active Problem List   Diagnosis Date Noted  . Failure to thrive in adult 07/14/2016  . Femoral neck fracture ([place]) 12/01/2015  . Malnutrition of moderate degree 12/01/2015  . Closed fracture of neck of left femur ([place])   . Mood disorder ([place]) 11/19/2015  . Dementia with behavioral disturbance 11/08/2015  . CKD (chronic kidney disease) stage 3, GFR 30-59 ml/min (HCC) 09/17/2015  . Insomnia 08/18/2015  . Confusion 06/20/2015  . Recent urinary tract infection 06/20/2015  . Dysuria 04/10/2015  . Edema 04/06/2015  . Acute encephalopathy 03/29/2015  . Vitamin D deficiency 03/29/2015  . UTI (lower urinary tract infection) 03/21/2015  . Hip fracture ([place]) 03/20/2015  . Chronic atrial fibrillation ([place]) 02/20/2015  . Hyperlipidemia 02/20/2015  . Essential hypertension, benign 12/13/2012  . Cardiac arrhythmia 12/13/2012  . Urinary incontinence 12/13/2012  . Hypothyroidism 12/13/2012  . History of depression 12/11/2012    CMP     Component Value Date/Time   NA 141 02/20/2016   K 4.7 02/20/2016   CL 103 01/10/2016 0030   CO2 23 01/10/2016  0030   GLUCOSE 111 (H) 01/10/2016 0030   BUN 41 (A) 05/24/2016   CREATININE 1.3 (A) 05/24/2016   CREATININE 1.26 (H) 01/10/2016 0030   CALCIUM 8.6 (L) 01/10/2016 0030   PROT 6.9 12/01/2015 0506   ALBUMIN 3.6 12/01/2015 0506   AST 18 02/20/2016   ALT 10 02/20/2016   ALKPHOS 101 02/20/2016   BILITOT 0.6 12/01/2015 0506   GFRNONAA 36 (L) 01/10/2016 0030   GFRAA 41 (L) 01/10/2016 0030   Recent Labs    12/01/15 0029 12/01/15 0506 12/08/15 01/10/16 01/10/16 0030 02/20/16 05/24/16  NA 141  141 141  141 143 139 139 141  --   K 4.4  4.4 4.2 4.2  --  3.9 4.7  --   CL 109 108  --   --  103  --   --   CO2  --  24  --   --  23  --   --   GLUCOSE 111* 175*  --   --  111*  --   --   BUN 46*  46* 40*  40* 57* 31* 31* 33* 41*  CREATININE 1.70*  1.7* 1.64*  1.6* 1.6* 1.3* 1.26* 1.1 1.3*  CALCIUM  --  9.1  --   --  8.6*  --   --    Recent Labs    12/01/15 0506 02/20/16  AST 24 18  ALT 16 10  ALKPHOS 75 101  BILITOT 0.6  --   PROT 6.9  --   ALBUMIN 3.6  --    Recent Labs    12/01/15 0111 12/01/15 0506  12/02/15 0429 01/10/16 01/10/16 0030  WBC 10.0  10.0 12.4  12.4*   < > 11.1* 7.6 7.6  NEUTROABS 7.8* 11.0*  --   --   --  5.4  HGB 11.2*  11.2* 11.9*  11.9*  --  11.6*  --  11.3*  HCT 35.1*  35* 36.6  37  --  35.9*  --  34.8*  MCV 90.2 89.3  --  88.9  --  88.8  PLT 242  242 248  248  --  227  --  306   < > = values in this interval not displayed.   Recent Labs    02/20/16  CHOL 136  LDLCALC 60  TRIG 98   No results found for: [facility] Lab Results  Component Value Date   TSH 0.83 02/20/2016   Lab Results  Component Value Date   HGBA1C 5.4 02/20/2016   Lab Results  Component Value Date   CHOL 136 02/20/2016   HDL 57 02/20/2016   LDLCALC 60 02/20/2016   TRIG 98 02/20/2016    Significant Diagnostic Results in last 30 days:  No results found.  Assessment and Plan  Hypothyroidism TSH stable; plan to continue Synthroid 25 g by mouth  daily  Vitamin D deficiency It's been 9 months since patient's vitamin D was checked and greater than 60; will redraw vitamin D level  Mood disorder (HCC) Chronic and stable; continue Seroquel 50 mg by mouth daily at bedtime    Webb Silversmith D. [name], MD

## 2016-11-29 ENCOUNTER — Encounter: Payer: Self-pay | Admitting: Internal Medicine

## 2016-11-29 NOTE — Assessment & Plan Note (Signed)
TSH stable; plan to continue Synthroid 25 g by mouth daily

## 2016-11-29 NOTE — Assessment & Plan Note (Signed)
It's been 9 months since patient's vitamin D was checked and greater than 60; will redraw vitamin D level

## 2016-11-29 NOTE — Assessment & Plan Note (Signed)
Chronic and stable; continue Seroquel 50 mg by mouth daily at bedtime

## 2016-12-03 DIAGNOSIS — E559 Vitamin D deficiency, unspecified: Secondary | ICD-10-CM | POA: Diagnosis not present

## 2016-12-03 DIAGNOSIS — E785 Hyperlipidemia, unspecified: Secondary | ICD-10-CM | POA: Diagnosis not present

## 2016-12-03 DIAGNOSIS — E039 Hypothyroidism, unspecified: Secondary | ICD-10-CM | POA: Diagnosis not present

## 2016-12-03 DIAGNOSIS — E119 Type 2 diabetes mellitus without complications: Secondary | ICD-10-CM | POA: Diagnosis not present

## 2016-12-03 DIAGNOSIS — I10 Essential (primary) hypertension: Secondary | ICD-10-CM | POA: Diagnosis not present

## 2016-12-03 LAB — BASIC METABOLIC PANEL
BUN: 19 (ref 4–21)
Creatinine: 1 (ref 0.5–1.1)
GLUCOSE: 81
POTASSIUM: 4.1 (ref 3.4–5.3)
SODIUM: 143 (ref 137–147)

## 2016-12-03 LAB — CBC AND DIFFERENTIAL
HEMATOCRIT: 37 (ref 36–46)
HEMOGLOBIN: 12.1 (ref 12.0–16.0)
PLATELETS: 255 (ref 150–399)
WBC: 5.8

## 2016-12-03 LAB — VITAMIN B12: Vitamin B-12: 5.1

## 2016-12-03 LAB — VITAMIN D 25 HYDROXY (VIT D DEFICIENCY, FRACTURES): Vit D, 25-Hydroxy: 32.23

## 2016-12-03 LAB — TSH: TSH: 0.2 — AB (ref 0.41–5.90)

## 2016-12-03 LAB — HEPATIC FUNCTION PANEL
ALT: 9 (ref 7–35)
AST: 16 (ref 13–35)
Alkaline Phosphatase: 71 (ref 25–125)
Bilirubin, Total: 0.5

## 2016-12-03 LAB — LIPID PANEL
CHOLESTEROL: 117 (ref 0–200)
HDL: 54 (ref 35–70)
LDL CALC: 46
Triglycerides: 81 (ref 40–160)

## 2016-12-05 DIAGNOSIS — F419 Anxiety disorder, unspecified: Secondary | ICD-10-CM | POA: Diagnosis not present

## 2016-12-05 DIAGNOSIS — F29 Unspecified psychosis not due to a substance or known physiological condition: Secondary | ICD-10-CM | POA: Diagnosis not present

## 2016-12-05 DIAGNOSIS — F0390 Unspecified dementia without behavioral disturbance: Secondary | ICD-10-CM | POA: Diagnosis not present

## 2016-12-05 DIAGNOSIS — G4700 Insomnia, unspecified: Secondary | ICD-10-CM | POA: Diagnosis not present

## 2016-12-06 ENCOUNTER — Other Ambulatory Visit: Payer: Self-pay

## 2016-12-23 ENCOUNTER — Encounter: Payer: Self-pay | Admitting: Internal Medicine

## 2016-12-23 ENCOUNTER — Non-Acute Institutional Stay (SKILLED_NURSING_FACILITY): Payer: Medicare Other | Admitting: Internal Medicine

## 2016-12-23 DIAGNOSIS — F0281 Dementia in other diseases classified elsewhere with behavioral disturbance: Secondary | ICD-10-CM | POA: Diagnosis not present

## 2016-12-23 DIAGNOSIS — N183 Chronic kidney disease, stage 3 unspecified: Secondary | ICD-10-CM

## 2016-12-23 DIAGNOSIS — E785 Hyperlipidemia, unspecified: Secondary | ICD-10-CM | POA: Diagnosis not present

## 2016-12-23 DIAGNOSIS — G301 Alzheimer's disease with late onset: Secondary | ICD-10-CM | POA: Diagnosis not present

## 2016-12-23 DIAGNOSIS — F02818 Dementia in other diseases classified elsewhere, unspecified severity, with other behavioral disturbance: Secondary | ICD-10-CM

## 2016-12-23 NOTE — Progress Notes (Signed)
Location:  [facility] Room Number: [place]:  SNF ([phone])  [name], MD  Patient Care Team: [name], MD as PCP - General (Internal Medicine)  Extended Emergency Contact Information Primary Emergency Contact: [facility] Address: [address]          [place], [place] [address] Johnnette Litter of [place] Phone: [phone] Relation: Son Secondary Emergency Contact: [place], [place] [address] Johnnette Litter of [place] Phone: [phone] Relation: Son    Allergies: Patient has no known allergies.  Chief Complaint  Patient presents with  . Medical Management of Chronic Issues    routine visit    HPI: Patient is 83 y.o. female who is being seen for routine issues of hyperlipidemia, chronic kidney disease stage III, and dementia.  Past Medical History:  Diagnosis Date  . Acute encephalopathy 03/29/2015  . Cardiac arrhythmia 12/13/2012   Per pt, takes atenolol, pradaxa. asymptomatic.   Marland Kitchen Chronic atrial fibrillation ([place])   . Chronic renal insufficiency, stage III (moderate) (HCC)   . CKD (chronic kidney disease) stage 3, GFR 30-59 ml/min (HCC) 09/17/2015  . Closed fracture of neck of left femur ([place])   . Confusion 06/20/2015  . Constipation   . CVA (cerebral infarction) approx 2012   residual defect: poor R sided peripheral vision per son  . Dementia with behavioral disturbance 11/08/2015  . Depression   . Femoral neck fracture ([place]) 12/01/2015  . Hip fracture ([place]) 03/20/2015  . History of depression 12/11/2012   Pt reports 40 pound weight loss. No current Tx.   Marland Kitchen History of fracture of right hip 03/2015  . Hyperlipidemia   . Hypertension   . Hypothyroidism   . Insomnia   . Osteoarthritis    wrists, hands  . Osteoporosis   . Urinary incontinence 12/13/2012  . Vitamin D deficiency 03/29/2015    Past Surgical History:  Procedure Laterality Date  . APPENDECTOMY    . COLONOSCOPY  08/22/08    . INTRAMEDULLARY (IM) NAIL INTERTROCHANTERIC Right 03/22/2015   Procedure: RIGHT femoral HIP NAILING;  Surgeon: Gaynelle Arabian, MD;  Location: [facility];  Service: Orthopedics;  Laterality: Right;  . KNEE ARTHROSCOPY Right   . THYROID SURGERY      Allergies as of 12/23/2016   No Known Allergies     Medication List        Accurate as of 12/23/16 11:59 PM. Always use your most recent med list.          acetaminophen 500 MG tablet Commonly known as:  TYLENOL Take 2 tablets (1,000 mg total) by mouth every 6 (six) hours as needed for mild pain, moderate pain or headache.   atenolol 50 MG tablet Commonly known as:  TENORMIN Take 50 mg by mouth daily.   ELIQUIS 2.5 MG Tabs tablet Generic drug:  apixaban Take 2.5 mg by mouth 2 (two) times daily. 9am and 5pm   feeding supplement Liqd Take 1 Container by mouth 3 (three) times daily between meals.   levothyroxine 25 MCG tablet Commonly known as:  SYNTHROID, LEVOTHROID Take 25 mcg by mouth daily before breakfast.   ondansetron 4 MG disintegrating tablet Commonly known as:  ZOFRAN-ODT Take 4 mg by mouth every 8 (eight) hours as needed for nausea or vomiting.   pravastatin 40 MG tablet Commonly known as:  PRAVACHOL Take 1 tablet (40 mg total) by mouth at bedtime.  QUEtiapine 50 MG tablet Commonly known as:  SEROQUEL Take 50 mg by mouth at bedtime.   senna 8.6 MG tablet Commonly known as:  SENOKOT Take 1 tablet by mouth at bedtime as needed for constipation.   tizanidine 2 MG capsule Commonly known as:  ZANAFLEX Take 2 mg by mouth. Take one capsule every 8 hours       No orders of the defined types were placed in this encounter.   Immunization History  Administered Date(s) Administered  . Influenza Split 11/20/2012  . Influenza-Unspecified 10/07/2015, 10/18/2016  . PPD Test 08/14/2015  . Pneumococcal Conjugate-13 05/22/2015  . Pneumococcal Polysaccharide-23 08/22/2008  . Tdap 01/01/2015, 08/06/2015, 01/10/2016     Social History   Tobacco Use  . Smoking status: Never Smoker  . Smokeless tobacco: Never Used  Substance Use Topics  . Alcohol use: No    Review of Systems  unable to obtain secondary to dementia; nursing-no concerns    Vitals:   12/23/16 1154  BP: 120/84  Pulse: 70  Resp: 18  Temp: 98.2 F (36.8 C)  SpO2: 95%   Body mass index is 19.57 kg/m. Physical Exam  GENERAL APPEARANCE: Alert, conversant, No acute distress  SKIN: No diaphoresis rash HEENT: Unremarkable RESPIRATORY: Breathing is even, unlabored. Lung sounds are clear   CARDIOVASCULAR: Heart RRR no murmurs, rubs or gallops. No peripheral edema  GASTROINTESTINAL: Abdomen is soft, non-tender, not distended w/ normal bowel sounds.  GENITOURINARY: Bladder non tender, not distended  MUSCULOSKELETAL: No abnormal joints or musculature NEUROLOGIC: Cranial nerves 2-12 grossly intact. Moves all extremities PSYCHIATRIC: Mood and affect with dementia, no behavioral issues  Patient Active Problem List   Diagnosis Date Noted  . Failure to thrive in adult 07/14/2016  . Femoral neck fracture ([place]) 12/01/2015  . Malnutrition of moderate degree 12/01/2015  . Closed fracture of neck of left femur ([place])   . Mood disorder ([place]) 11/19/2015  . Dementia with behavioral disturbance 11/08/2015  . CKD (chronic kidney disease) stage 3, GFR 30-59 ml/min (HCC) 09/17/2015  . Insomnia 08/18/2015  . Confusion 06/20/2015  . Recent urinary tract infection 06/20/2015  . Dysuria 04/10/2015  . Edema 04/06/2015  . Acute encephalopathy 03/29/2015  . Vitamin D deficiency 03/29/2015  . UTI (lower urinary tract infection) 03/21/2015  . Hip fracture ([place]) 03/20/2015  . Chronic atrial fibrillation ([place]) 02/20/2015  . Hyperlipidemia 02/20/2015  . Essential hypertension, benign 12/13/2012  . Cardiac arrhythmia 12/13/2012  . Urinary incontinence 12/13/2012  . Hypothyroidism 12/13/2012  . History of depression 12/11/2012    CMP      Component Value Date/Time   NA 143 12/03/2016   K 4.1 12/03/2016   CL 103 01/10/2016 0030   CO2 23 01/10/2016 0030   GLUCOSE 111 (H) 01/10/2016 0030   BUN 19 12/03/2016   CREATININE 1.0 12/03/2016   CREATININE 1.26 (H) 01/10/2016 0030   CALCIUM 8.6 (L) 01/10/2016 0030   PROT 6.9 12/01/2015 0506   ALBUMIN 3.6 12/01/2015 0506   AST 16 12/03/2016   ALT 9 12/03/2016   ALKPHOS 71 12/03/2016   BILITOT 0.6 12/01/2015 0506   GFRNONAA 36 (L) 01/10/2016 0030   GFRAA 41 (L) 01/10/2016 0030   Recent Labs    02/20/16 05/24/16 12/03/16  NA 141  --  143  K 4.7  --  4.1  BUN 33* 41* 19  CREATININE 1.1 1.3* 1.0   Recent Labs    02/20/16 12/03/16  AST 18 16  ALT 10 9  ALKPHOS 101 71  Recent Labs    12/03/16  WBC 5.8  HGB 12.1  HCT 37  PLT 255   Recent Labs    02/20/16 12/03/16  CHOL 136 117  LDLCALC 60 46  TRIG 98 81   No results found for: [facility] Lab Results  Component Value Date   TSH 0.20 (A) 12/03/2016   Lab Results  Component Value Date   HGBA1C 5.4 02/20/2016   Lab Results  Component Value Date   CHOL 117 12/03/2016   HDL 54 12/03/2016   LDLCALC 46 12/03/2016   TRIG 81 12/03/2016    Significant Diagnostic Results in last 30 days:  No results found.  Assessment and Plan  Hyperlipidemia LDL 46, HDL 54; good control; plan to continue Pravachol 40 mg by mouth daily  CKD (chronic kidney disease) stage 3, GFR 30-59 ml/min No recent GFR; most recent creatinine 1.0 which is improved from prior; we'll monitor at intervals  Dementia with behavioral disturbance Chronic and stable without major declines; continue supportive care    Joevon Holliman D. [name], MD

## 2017-01-22 ENCOUNTER — Encounter: Payer: Self-pay | Admitting: Internal Medicine

## 2017-01-22 ENCOUNTER — Non-Acute Institutional Stay (SKILLED_NURSING_FACILITY): Payer: Medicare Other | Admitting: Internal Medicine

## 2017-01-22 DIAGNOSIS — F39 Unspecified mood [affective] disorder: Secondary | ICD-10-CM | POA: Diagnosis not present

## 2017-01-22 DIAGNOSIS — I482 Chronic atrial fibrillation, unspecified: Secondary | ICD-10-CM

## 2017-01-22 DIAGNOSIS — I10 Essential (primary) hypertension: Secondary | ICD-10-CM

## 2017-01-22 NOTE — Progress Notes (Signed)
Location:  [facility] Room Number: [phone] Place of Service:  SNF ([place] Number: [phone] [phone])  [name], MD  Patient Care Team: [name], MD as PCP - General (Internal Medicine)  Extended Emergency Contact Information Primary Emergency Contact: [facility] Address: [place], [place] [address] Johnnette Litter of [place] Phone: [phone] Relation: Son Secondary Emergency Contact: [place], [place] [address] Johnnette Litter of [place] Phone: [phone] Relation: Son    Allergies: Patient has no known allergies.  Chief Complaint  Patient presents with  . Medical Management of Chronic Issues    Routine visit (HTN, A-Fib)    HPI: Patient is 84 y.o. female who is being seen for routine issues of chronic atrial fib, hypertension, and mood disorder.  Past Medical History:  Diagnosis Date  . Acute encephalopathy 03/29/2015  . Cardiac arrhythmia 12/13/2012   Per pt, takes atenolol, pradaxa. asymptomatic.   Marland Kitchen Chronic atrial fibrillation ([place])   . Chronic renal insufficiency, stage III (moderate) (HCC)   . CKD (chronic kidney disease) stage 3, GFR 30-59 ml/min (HCC) 09/17/2015  . Closed fracture of neck of left femur ([place])   . Confusion 06/20/2015  . Constipation   . CVA (cerebral infarction) approx 2012   residual defect: poor R sided peripheral vision per son  . Dementia with behavioral disturbance 11/08/2015  . Depression   . Femoral neck fracture ([place]) 12/01/2015  . Hip fracture ([place]) 03/20/2015  . History of depression 12/11/2012   Pt reports 40 pound weight loss. No current Tx.   Marland Kitchen History of fracture of right hip 03/2015  . Hyperlipidemia   . Hypertension   . Hypothyroidism   . Insomnia   . Osteoarthritis    wrists, hands  . Osteoporosis   . Urinary incontinence 12/13/2012  . Vitamin D deficiency 03/29/2015    Past Surgical History:    Procedure Laterality Date  . APPENDECTOMY    . COLONOSCOPY  08/22/08  . INTRAMEDULLARY (IM) NAIL INTERTROCHANTERIC Right 03/22/2015   Procedure: RIGHT femoral HIP NAILING;  Surgeon: Gaynelle Arabian, MD;  [facility];  Service: Orthopedics;  Laterality: Right;  . KNEE ARTHROSCOPY Right   . THYROID SURGERY      Allergies as of 01/22/2017   No Known Allergies     Medication List        Accurate as of 01/22/17 11:59 PM. Always use your most recent med list.          acetaminophen 500 MG tablet Commonly known as:  TYLENOL Take 2 tablets (1,000 mg total) by mouth every 6 (six) hours as needed for mild pain, moderate pain or headache.   atenolol 50 MG tablet Commonly known as:  TENORMIN Take 50 mg by mouth daily.   ELIQUIS 2.5 MG Tabs tablet Generic drug:  apixaban Take 2.5 mg by mouth 2 (two) times daily. 9am and 5pm   feeding supplement Liqd Take 1 Container by mouth 3 (three) times daily between meals.   levothyroxine 25 MCG tablet Commonly known as:  SYNTHROID, LEVOTHROID Take 25 mcg by mouth daily before breakfast.   ondansetron 4 MG disintegrating tablet Commonly known as:  ZOFRAN-ODT Take 4 mg by mouth every 8 (eight) hours as needed for nausea or vomiting.   pravastatin 40 MG tablet Commonly known as:  PRAVACHOL Take  1 tablet (40 mg total) by mouth at bedtime.   QUEtiapine 50 MG tablet Commonly known as:  SEROQUEL Take 50 mg by mouth at bedtime.   senna 8.6 MG tablet Commonly known as:  SENOKOT Take 1 tablet by mouth at bedtime as needed for constipation.   tizanidine 2 MG capsule Commonly known as:  ZANAFLEX Take 2 mg by mouth. Take one capsule every 8 hours       No orders of the defined types were placed in this encounter.   Immunization History  Administered Date(s) Administered  . Influenza Split 11/20/2012  . Influenza-Unspecified 10/07/2015, 10/18/2016  . PPD Test 08/14/2015  . Pneumococcal Conjugate-13 05/22/2015  . Pneumococcal  Polysaccharide-23 08/22/2008  . Tdap 01/01/2015, 08/06/2015, 01/10/2016    Social History   Tobacco Use  . Smoking status: Never Smoker  . Smokeless tobacco: Never Used  Substance Use Topics  . Alcohol use: No    Review of Systems  unable to obtain secondary to dementia; nursing-no acute concerns     Vitals:   01/22/17 1555  BP: (!) 152/91  Pulse: 68  Resp: (!) 22  Temp: 98.2 F (36.8 C)  SpO2: 97%   Body mass index is 18.98 kg/m. Physical Exam  GENERAL APPEARANCE: Alert, conversant, No acute distress  SKIN: No diaphoresis rash HEENT: Unremarkable RESPIRATORY: Breathing is even, unlabored. Lung sounds are clear   CARDIOVASCULAR: Heart RRR no murmurs, rubs or gallops. No peripheral edema  GASTROINTESTINAL: Abdomen is soft, non-tender, not distended w/ normal bowel sounds.  GENITOURINARY: Bladder non tender, not distended  MUSCULOSKELETAL: No abnormal joints or musculature NEUROLOGIC: Cranial nerves 2-12 grossly intact. Moves all extremities PSYCHIATRIC: Dementia, no behavioral issues  Patient Active Problem List   Diagnosis Date Noted  . Failure to thrive in adult 07/14/2016  . Femoral neck fracture ([place]) 12/01/2015  . Malnutrition of moderate degree 12/01/2015  . Closed fracture of neck of left femur ([place])   . Mood disorder ([place]) 11/19/2015  . Dementia with behavioral disturbance 11/08/2015  . CKD (chronic kidney disease) stage 3, GFR 30-59 ml/min (HCC) 09/17/2015  . Insomnia 08/18/2015  . Confusion 06/20/2015  . Recent urinary tract infection 06/20/2015  . Dysuria 04/10/2015  . Edema 04/06/2015  . Acute encephalopathy 03/29/2015  . Vitamin D deficiency 03/29/2015  . UTI (lower urinary tract infection) 03/21/2015  . Hip fracture ([place]) 03/20/2015  . Chronic atrial fibrillation ([place]) 02/20/2015  . Hyperlipidemia 02/20/2015  . Essential hypertension, benign 12/13/2012  . Cardiac arrhythmia 12/13/2012  . Urinary incontinence 12/13/2012  . Hypothyroidism  12/13/2012  . History of depression 12/11/2012    CMP     Component Value Date/Time   NA 143 12/03/2016   K 4.1 12/03/2016   CL 103 01/10/2016 0030   CO2 23 01/10/2016 0030   GLUCOSE 111 (H) 01/10/2016 0030   BUN 19 12/03/2016   CREATININE 1.0 12/03/2016   CREATININE 1.26 (H) 01/10/2016 0030   CALCIUM 8.6 (L) 01/10/2016 0030   PROT 6.9 12/01/2015 0506   ALBUMIN 3.6 12/01/2015 0506   AST 16 12/03/2016   ALT 9 12/03/2016   ALKPHOS 71 12/03/2016   BILITOT 0.6 12/01/2015 0506   GFRNONAA 36 (L) 01/10/2016 0030   GFRAA 41 (L) 01/10/2016 0030   Recent Labs    02/20/16 05/24/16 12/03/16  NA 141  --  143  K 4.7  --  4.1  BUN 33* 41* 19  CREATININE 1.1 1.3* 1.0   Recent Labs    02/20/16 12/03/16  AST 18 16  ALT 10 9  ALKPHOS 101 71   Recent Labs    12/03/16  WBC 5.8  HGB 12.1  HCT 37  PLT 255   Recent Labs    02/20/16 12/03/16  CHOL 136 117  LDLCALC 60 46  TRIG 98 81   No results found for: [facility] Lab Results  Component Value Date   TSH 0.20 (A) 12/03/2016   Lab Results  Component Value Date   HGBA1C 5.4 02/20/2016   Lab Results  Component Value Date   CHOL 117 12/03/2016   HDL 54 12/03/2016   LDLCALC 46 12/03/2016   TRIG 81 12/03/2016    Significant Diagnostic Results in last 30 days:  No results found.  Assessment and Plan  Chronic atrial fibrillation (HCC) Stable; continue Tenormin 50 mg daily for rate and Eliquis 2.5 mg twice a day as prophylaxis  Essential hypertension, benign Recently well controlled for age; will continue observation; will continue Tenormin 50 mg by mouth daily  Mood disorder (HCC) Stable; continue Seroquel 50 mg by mouth daily at bedtime     Webb Silversmith D. [name], MD

## 2017-01-25 ENCOUNTER — Encounter: Payer: Self-pay | Admitting: Internal Medicine

## 2017-01-25 NOTE — Assessment & Plan Note (Signed)
No recent GFR; most recent creatinine 1.0 which is improved from prior; we'll monitor at intervals

## 2017-01-25 NOTE — Assessment & Plan Note (Signed)
LDL 46, HDL 54; good control; plan to continue Pravachol 40 mg by mouth daily

## 2017-01-25 NOTE — Assessment & Plan Note (Signed)
Chronic and stable without major declines; continue supportive care

## 2017-01-30 DIAGNOSIS — F29 Unspecified psychosis not due to a substance or known physiological condition: Secondary | ICD-10-CM | POA: Diagnosis not present

## 2017-01-30 DIAGNOSIS — F419 Anxiety disorder, unspecified: Secondary | ICD-10-CM | POA: Diagnosis not present

## 2017-01-30 DIAGNOSIS — F0390 Unspecified dementia without behavioral disturbance: Secondary | ICD-10-CM | POA: Diagnosis not present

## 2017-01-30 DIAGNOSIS — G4700 Insomnia, unspecified: Secondary | ICD-10-CM | POA: Diagnosis not present

## 2017-02-02 ENCOUNTER — Encounter: Payer: Self-pay | Admitting: Internal Medicine

## 2017-02-02 NOTE — Assessment & Plan Note (Signed)
Stable; continue Seroquel 50 mg by mouth daily at bedtime

## 2017-02-02 NOTE — Assessment & Plan Note (Signed)
Stable; continue Tenormin 50 mg daily for rate and Eliquis 2.5 mg twice a day as prophylaxis

## 2017-02-02 NOTE — Assessment & Plan Note (Signed)
Recently well controlled for age; will continue observation; will continue Tenormin 50 mg by mouth daily

## 2017-02-18 ENCOUNTER — Encounter: Payer: Self-pay | Admitting: Internal Medicine

## 2017-02-18 ENCOUNTER — Non-Acute Institutional Stay (SKILLED_NURSING_FACILITY): Payer: Medicare Other | Admitting: Internal Medicine

## 2017-02-18 DIAGNOSIS — E559 Vitamin D deficiency, unspecified: Secondary | ICD-10-CM | POA: Diagnosis not present

## 2017-02-18 DIAGNOSIS — F0281 Dementia in other diseases classified elsewhere with behavioral disturbance: Secondary | ICD-10-CM | POA: Diagnosis not present

## 2017-02-18 DIAGNOSIS — E034 Atrophy of thyroid (acquired): Secondary | ICD-10-CM

## 2017-02-18 DIAGNOSIS — F02818 Dementia in other diseases classified elsewhere, unspecified severity, with other behavioral disturbance: Secondary | ICD-10-CM

## 2017-02-18 DIAGNOSIS — G301 Alzheimer's disease with late onset: Secondary | ICD-10-CM | POA: Diagnosis not present

## 2017-02-18 NOTE — Progress Notes (Signed)
Location:  [facility] Room Number: [phone] Place of Service:  SNF [phone])  [name], MD  Patient Care Team: [name], MD as PCP - General (Internal Medicine)  Extended Emergency Contact Information Primary Emergency Contact: [facility] Address: [address]          [place], [place] [address] Johnnette Litter of [place] Phone: [phone] Relation: Son Secondary Emergency Contact: [place], [place] [address] Johnnette Litter of [place] Phone: [phone] Relation: Son    Allergies: Patient has no known allergies.  Chief Complaint  Patient presents with  . Medical Management of Chronic Issues    Routine Visit    HPI: Patient is 84 y.o. female who is being seen for routine issues of hypothyroidism, vitamin D deficiency, and dementia.  Past Medical History:  Diagnosis Date  . Acute encephalopathy 03/29/2015  . Cardiac arrhythmia 12/13/2012   Per pt, takes atenolol, pradaxa. asymptomatic.   Marland Kitchen Chronic atrial fibrillation ([place])   . Chronic renal insufficiency, stage III (moderate) (HCC)   . CKD (chronic kidney disease) stage 3, GFR 30-59 ml/min (HCC) 09/17/2015  . Closed fracture of neck of left femur ([place])   . Confusion 06/20/2015  . Constipation   . CVA (cerebral infarction) approx 2012   residual defect: poor R sided peripheral vision per son  . Dementia with behavioral disturbance 11/08/2015  . Depression   . Femoral neck fracture ([place]) 12/01/2015  . Hip fracture ([place]) 03/20/2015  . History of depression 12/11/2012   Pt reports 40 pound weight loss. No current Tx.   Marland Kitchen History of fracture of right hip 03/2015  . Hyperlipidemia   . Hypertension   . Hypothyroidism   . Insomnia   . Osteoarthritis    wrists, hands  . Osteoporosis   . Urinary incontinence 12/13/2012  . Vitamin D deficiency 03/29/2015    Past Surgical History:  Procedure Laterality Date  . APPENDECTOMY    . COLONOSCOPY  08/22/08  .  INTRAMEDULLARY (IM) NAIL INTERTROCHANTERIC Right 03/22/2015   Procedure: RIGHT femoral HIP NAILING;  Surgeon: Gaynelle Arabian, MD;  [facility];  Service: Orthopedics;  Laterality: Right;  . KNEE ARTHROSCOPY Right   . THYROID SURGERY      Allergies as of 02/18/2017   No Known Allergies     Medication List        Accurate as of 02/18/17 11:59 PM. Always use your most recent med list.          acetaminophen 500 MG tablet Commonly known as:  TYLENOL Take 2 tablets (1,000 mg total) by mouth every 6 (six) hours as needed for mild pain, moderate pain or headache.   atenolol 50 MG tablet Commonly known as:  TENORMIN Take 50 mg by mouth daily.   ELIQUIS 2.5 MG Tabs tablet Generic drug:  apixaban Take 2.5 mg by mouth 2 (two) times daily. 9am and 5pm   feeding supplement Liqd Take 1 Container by mouth 3 (three) times daily between meals.   levothyroxine 25 MCG tablet Commonly known as:  SYNTHROID, LEVOTHROID Take 25 mcg by mouth daily before breakfast.   ondansetron 4 MG disintegrating tablet Commonly known as:  ZOFRAN-ODT Take 4 mg by mouth every 8 (eight) hours as needed for nausea or vomiting.   pravastatin 40 MG tablet Commonly known as:  PRAVACHOL Take 1 tablet (40 mg total) by mouth at bedtime.   QUEtiapine 25 MG  tablet Commonly known as:  SEROQUEL Take 25 mg by mouth at bedtime.   senna 8.6 MG tablet Commonly known as:  SENOKOT Take 1 tablet by mouth at bedtime as needed for constipation.   tizanidine 2 MG capsule Commonly known as:  ZANAFLEX Take 2 mg by mouth every 8 (eight) hours.       No orders of the defined types were placed in this encounter.   Immunization History  Administered Date(s) Administered  . Influenza Split 11/20/2012  . Influenza-Unspecified 10/07/2015, 10/18/2016  . PPD Test 08/14/2015  . Pneumococcal Conjugate-13 05/22/2015  . Pneumococcal Polysaccharide-23 08/22/2008  . Tdap 01/01/2015, 08/06/2015, 01/10/2016    Social  History   Tobacco Use  . Smoking status: Never Smoker  . Smokeless tobacco: Never Used  Substance Use Topics  . Alcohol use: No    Review of Systems  unable to obtain secondary to dementia nursing-no acute concerns    Vitals:   02/18/17 1130  BP: 90/60  Pulse: 75  Resp: 18  Temp: 98.3 F (36.8 C)  SpO2: 96%   Body mass index is 18.95 kg/m. Physical Exam  GENERAL APPEARANCE: Alert, conversant, No acute distress  SKIN: No diaphoresis rash HEENT: Unremarkable RESPIRATORY: Breathing is even, unlabored. Lung sounds are clear   CARDIOVASCULAR: Heart RRR no murmurs, rubs or gallops. No peripheral edema  GASTROINTESTINAL: Abdomen is soft, non-tender, not distended w/ normal bowel sounds.  GENITOURINARY: Bladder non tender, not distended  MUSCULOSKELETAL: No abnormal joints or musculature NEUROLOGIC: Cranial nerves 2-12 grossly intact. Moves all extremities PSYCHIATRIC: Dementia, no behavioral issues  Patient Active Problem List   Diagnosis Date Noted  . Failure to thrive in adult 07/14/2016  . Femoral neck fracture ([place]) 12/01/2015  . Malnutrition of moderate degree 12/01/2015  . Closed fracture of neck of left femur ([place])   . Mood disorder ([place]) 11/19/2015  . Dementia with behavioral disturbance 11/08/2015  . CKD (chronic kidney disease) stage 3, GFR 30-59 ml/min (HCC) 09/17/2015  . Insomnia 08/18/2015  . Confusion 06/20/2015  . Recent urinary tract infection 06/20/2015  . Dysuria 04/10/2015  . Edema 04/06/2015  . Acute encephalopathy 03/29/2015  . Vitamin D deficiency 03/29/2015  . UTI (lower urinary tract infection) 03/21/2015  . Hip fracture ([place]) 03/20/2015  . Chronic atrial fibrillation ([place]) 02/20/2015  . Hyperlipidemia 02/20/2015  . Essential hypertension, benign 12/13/2012  . Cardiac arrhythmia 12/13/2012  . Urinary incontinence 12/13/2012  . Hypothyroidism 12/13/2012  . History of depression 12/11/2012    CMP     Component Value Date/Time   NA 143  12/03/2016   K 4.1 12/03/2016   CL 103 01/10/2016 0030   CO2 23 01/10/2016 0030   GLUCOSE 111 (H) 01/10/2016 0030   BUN 19 12/03/2016   CREATININE 1.0 12/03/2016   CREATININE 1.26 (H) 01/10/2016 0030   CALCIUM 8.6 (L) 01/10/2016 0030   PROT 6.9 12/01/2015 0506   ALBUMIN 3.6 12/01/2015 0506   AST 16 12/03/2016   ALT 9 12/03/2016   ALKPHOS 71 12/03/2016   BILITOT 0.6 12/01/2015 0506   GFRNONAA 36 (L) 01/10/2016 0030   GFRAA 41 (L) 01/10/2016 0030   Recent Labs    05/24/16 12/03/16  NA  --  143  K  --  4.1  BUN 41* 19  CREATININE 1.3* 1.0   Recent Labs    12/03/16  AST 16  ALT 9  ALKPHOS 71   Recent Labs    12/03/16  WBC 5.8  HGB 12.1  HCT 37  PLT 255   Recent Labs    12/03/16  CHOL 117  LDLCALC 46  TRIG 81   No results found for: [facility] Lab Results  Component Value Date   TSH 0.20 (A) 12/03/2016   Lab Results  Component Value Date   HGBA1C 5.4 02/20/2016   Lab Results  Component Value Date   CHOL 117 12/03/2016   HDL 54 12/03/2016   LDLCALC 46 12/03/2016   TRIG 81 12/03/2016    Significant Diagnostic Results in last 30 days:  No results found.  Assessment and Plan  Hypothyroidism Stable; stable; continue Synthroid 25 g daily  Vitamin D deficiency Vitamin D level XXXII; normal noted for placement at this time  Dementia with behavioral disturbance Chronic without major declines; continue supportive care    Beanca Kiester D. [name], MD

## 2017-02-20 DIAGNOSIS — B351 Tinea unguium: Secondary | ICD-10-CM | POA: Diagnosis not present

## 2017-02-20 DIAGNOSIS — R262 Difficulty in walking, not elsewhere classified: Secondary | ICD-10-CM | POA: Diagnosis not present

## 2017-02-20 DIAGNOSIS — I739 Peripheral vascular disease, unspecified: Secondary | ICD-10-CM | POA: Diagnosis not present

## 2017-02-22 ENCOUNTER — Encounter: Payer: Self-pay | Admitting: Internal Medicine

## 2017-02-22 NOTE — Assessment & Plan Note (Signed)
Chronic without major declines; continue supportive care

## 2017-02-22 NOTE — Assessment & Plan Note (Signed)
Stable; stable; continue Synthroid 25 g daily

## 2017-02-22 NOTE — Assessment & Plan Note (Signed)
Vitamin D level XXXII; normal noted for placement at this time

## 2017-02-25 DIAGNOSIS — G4700 Insomnia, unspecified: Secondary | ICD-10-CM | POA: Diagnosis not present

## 2017-02-25 DIAGNOSIS — F0390 Unspecified dementia without behavioral disturbance: Secondary | ICD-10-CM | POA: Diagnosis not present

## 2017-02-25 DIAGNOSIS — R443 Hallucinations, unspecified: Secondary | ICD-10-CM | POA: Diagnosis not present

## 2017-02-25 DIAGNOSIS — F419 Anxiety disorder, unspecified: Secondary | ICD-10-CM | POA: Diagnosis not present

## 2017-03-14 ENCOUNTER — Non-Acute Institutional Stay (SKILLED_NURSING_FACILITY): Payer: Medicare Other | Admitting: Internal Medicine

## 2017-03-14 DIAGNOSIS — F39 Unspecified mood [affective] disorder: Secondary | ICD-10-CM | POA: Diagnosis not present

## 2017-03-14 DIAGNOSIS — E785 Hyperlipidemia, unspecified: Secondary | ICD-10-CM

## 2017-03-14 DIAGNOSIS — N183 Chronic kidney disease, stage 3 unspecified: Secondary | ICD-10-CM

## 2017-03-16 ENCOUNTER — Encounter: Payer: Self-pay | Admitting: Internal Medicine

## 2017-03-16 NOTE — Progress Notes (Signed)
Location:  [facility] of Service:  SNF (31)  [name], MD  Patient Care Team: [name], MD as PCP - General (Internal Medicine)  Extended Emergency Contact Information Primary Emergency Contact: [facility] Address: [place], [place] [address] Johnnette Litter of [place] Phone: [phone] Relation: Son Secondary Emergency Contact: [place], [place] [address] Johnnette Litter of [place] Phone: [phone] Relation: Son    Allergies: Patient has no known allergies.  Chief Complaint  Patient presents with  . Medical Management of Chronic Issues    HPI: Patient is 84 y.o. female who is being seen for routine issues of chronic kidney disease stage III, hyperlipidemia, and mood disorder.  Past Medical History:  Diagnosis Date  . Acute encephalopathy 03/29/2015  . Cardiac arrhythmia 12/13/2012   Per pt, takes atenolol, pradaxa. asymptomatic.   Marland Kitchen Chronic atrial fibrillation ([place])   . Chronic renal insufficiency, stage III (moderate) (HCC)   . CKD (chronic kidney disease) stage 3, GFR 30-59 ml/min (HCC) 09/17/2015  . Closed fracture of neck of left femur ([place])   . Confusion 06/20/2015  . Constipation   . CVA (cerebral infarction) approx 2012   residual defect: poor R sided peripheral vision per son  . Dementia with behavioral disturbance 11/08/2015  . Depression   . Femoral neck fracture ([place]) 12/01/2015  . Hip fracture ([place]) 03/20/2015  . History of depression 12/11/2012   Pt reports 40 pound weight loss. No current Tx.   Marland Kitchen History of fracture of right hip 03/2015  . Hyperlipidemia   . Hypertension   . Hypothyroidism   . Insomnia   . Osteoarthritis    wrists, hands  . Osteoporosis   . Urinary incontinence 12/13/2012  . Vitamin D deficiency 03/29/2015    Past Surgical History:  Procedure Laterality Date  . APPENDECTOMY    . COLONOSCOPY  08/22/08  . INTRAMEDULLARY (IM) NAIL  INTERTROCHANTERIC Right 03/22/2015   Procedure: RIGHT femoral HIP NAILING;  Surgeon: Gaynelle Arabian, MD;  [facility]: [facility];  Service: Orthopedics;  Laterality: Right;  . KNEE ARTHROSCOPY Right   . THYROID SURGERY      Allergies as of 03/14/2017   No Known Allergies     Medication List        Accurate as of 03/14/17 11:59 PM. Always use your most recent med list.          acetaminophen 500 MG tablet Commonly known as:  TYLENOL Take 2 tablets (1,000 mg total) by mouth every 6 (six) hours as needed for mild pain, moderate pain or headache.   atenolol 50 MG tablet Commonly known as:  TENORMIN Take 50 mg by mouth daily.   ELIQUIS 2.5 MG Tabs tablet Generic drug:  apixaban Take 2.5 mg by mouth 2 (two) times daily. 9am and 5pm   feeding supplement Liqd Take 1 Container by mouth 3 (three) times daily between meals.   levothyroxine 25 MCG tablet Commonly known as:  SYNTHROID, LEVOTHROID Take 25 mcg by mouth daily before breakfast.   ondansetron 4 MG disintegrating tablet Commonly known as:  ZOFRAN-ODT Take 4 mg by mouth every 8 (eight) hours as needed for nausea or vomiting.   pravastatin 40 MG tablet Commonly known as:  PRAVACHOL Take 1 tablet (40 mg total) by mouth at bedtime.   QUEtiapine 25 MG tablet Commonly known as:  SEROQUEL Take 25 mg by mouth at bedtime.   senna 8.6 MG tablet Commonly known as:  SENOKOT Take 1 tablet by mouth at bedtime as needed for constipation.   tizanidine 2 MG capsule Commonly known as:  ZANAFLEX Take 2 mg by mouth every 8 (eight) hours.       No orders of the defined types were placed in this encounter.   Immunization History  Administered Date(s) Administered  . Influenza Split 11/20/2012  . Influenza-Unspecified 10/07/2015, 10/18/2016  . PPD Test 08/14/2015  . Pneumococcal Conjugate-13 05/22/2015  . Pneumococcal Polysaccharide-23 08/22/2008  . Tdap 01/01/2015, 08/06/2015, 01/10/2016    Social History   Tobacco Use  .  Smoking status: Never Smoker  . Smokeless tobacco: Never Used  Substance Use Topics  . Alcohol use: No    Review of Systems  unable to obtain secondary to dementia; nursing-no acute concerns      Vitals:   03/16/17 2040  BP: 95/64  Pulse: 72  Resp: 18  Temp: (!) 97 F (36.1 C)  SpO2: 94%   There is no height or weight on file to calculate BMI. Physical Exam  GENERAL APPEARANCE: Alert, conversant, No acute distress  SKIN: No diaphoresis rash HEENT: Unremarkable RESPIRATORY: Breathing is even, unlabored. Lung sounds are clear   CARDIOVASCULAR: Heart RRR no murmurs, rubs or gallops. No peripheral edema  GASTROINTESTINAL: Abdomen is soft, non-tender, not distended w/ normal bowel sounds.  GENITOURINARY: Bladder non tender, not distended  MUSCULOSKELETAL: No abnormal joints or musculature NEUROLOGIC: Cranial nerves 2-12 grossly intact. Moves all extremities PSYCHIATRIC: Dementia, no behavioral issues  Patient Active Problem List   Diagnosis Date Noted  . Failure to thrive in adult 07/14/2016  . Femoral neck fracture ([place]) 12/01/2015  . Malnutrition of moderate degree 12/01/2015  . Closed fracture of neck of left femur ([place])   . Mood disorder ([place]) 11/19/2015  . Dementia with behavioral disturbance 11/08/2015  . CKD (chronic kidney disease) stage 3, GFR 30-59 ml/min (HCC) 09/17/2015  . Insomnia 08/18/2015  . Confusion 06/20/2015  . Recent urinary tract infection 06/20/2015  . Dysuria 04/10/2015  . Edema 04/06/2015  . Acute encephalopathy 03/29/2015  . Vitamin D deficiency 03/29/2015  . UTI (lower urinary tract infection) 03/21/2015  . Hip fracture ([place]) 03/20/2015  . Chronic atrial fibrillation ([place]) 02/20/2015  . Hyperlipidemia 02/20/2015  . Essential hypertension, benign 12/13/2012  . Cardiac arrhythmia 12/13/2012  . Urinary incontinence 12/13/2012  . Hypothyroidism 12/13/2012  . History of depression 12/11/2012    CMP     Component Value Date/Time   NA 143  12/03/2016   K 4.1 12/03/2016   CL 103 01/10/2016 0030   CO2 23 01/10/2016 0030   GLUCOSE 111 (H) 01/10/2016 0030   BUN 19 12/03/2016   CREATININE 1.0 12/03/2016   CREATININE 1.26 (H) 01/10/2016 0030   CALCIUM 8.6 (L) 01/10/2016 0030   PROT 6.9 12/01/2015 0506   ALBUMIN 3.6 12/01/2015 0506   AST 16 12/03/2016   ALT 9 12/03/2016   ALKPHOS 71 12/03/2016   BILITOT 0.6 12/01/2015 0506   GFRNONAA 36 (L) 01/10/2016 0030   GFRAA 41 (L) 01/10/2016 0030   Recent Labs    05/24/16 12/03/16  NA  --  143  K  --  4.1  BUN 41* 19  CREATININE 1.3* 1.0   Recent Labs    12/03/16  AST 16  ALT 9  ALKPHOS 71   Recent Labs    12/03/16  WBC 5.8  HGB 12.1  HCT 37  PLT 255   Recent Labs    12/03/16  CHOL 117  LDLCALC 46  TRIG 81   No results found for: [facility] Lab Results  Component Value Date   TSH 0.20 (A) 12/03/2016   Lab Results  Component Value Date   HGBA1C 5.4 02/20/2016   Lab Results  Component Value Date   CHOL 117 12/03/2016   HDL 54 12/03/2016   LDLCALC 46 12/03/2016   TRIG 81 12/03/2016    Significant Diagnostic Results in last 30 days:  No results found.  Assessment and Plan  CKD (chronic kidney disease) stage 3, GFR 30-59 ml/min BUN/creatinine is 19/1.0 stable from prior; monitor at intervals  Hyperlipidemia LDL 46, HDL 54, good control; continue Zocor 40 mg daily  Mood disorder ([place]) Relatively well controlled; continue Seroquel 25 mg daily at bedtime    Inocencio Homes, MD

## 2017-03-16 NOTE — Assessment & Plan Note (Signed)
Relatively well controlled; continue Seroquel 25 mg daily at bedtime

## 2017-03-16 NOTE — Assessment & Plan Note (Signed)
LDL 46, HDL 54, good control; continue Zocor 40 mg daily

## 2017-03-16 NOTE — Assessment & Plan Note (Signed)
BUN/creatinine is 19/1.0 stable from prior; monitor at intervals

## 2017-03-24 DIAGNOSIS — F0390 Unspecified dementia without behavioral disturbance: Secondary | ICD-10-CM | POA: Diagnosis not present

## 2017-03-24 DIAGNOSIS — F419 Anxiety disorder, unspecified: Secondary | ICD-10-CM | POA: Diagnosis not present

## 2017-03-24 DIAGNOSIS — G4700 Insomnia, unspecified: Secondary | ICD-10-CM | POA: Diagnosis not present

## 2017-03-24 DIAGNOSIS — R443 Hallucinations, unspecified: Secondary | ICD-10-CM | POA: Diagnosis not present

## 2017-04-22 ENCOUNTER — Non-Acute Institutional Stay (SKILLED_NURSING_FACILITY): Payer: Medicare Other | Admitting: Internal Medicine

## 2017-04-22 ENCOUNTER — Encounter: Payer: Self-pay | Admitting: Internal Medicine

## 2017-04-22 DIAGNOSIS — G301 Alzheimer's disease with late onset: Secondary | ICD-10-CM | POA: Diagnosis not present

## 2017-04-22 DIAGNOSIS — F02818 Dementia in other diseases classified elsewhere, unspecified severity, with other behavioral disturbance: Secondary | ICD-10-CM

## 2017-04-22 DIAGNOSIS — R269 Unspecified abnormalities of gait and mobility: Secondary | ICD-10-CM | POA: Diagnosis not present

## 2017-04-22 DIAGNOSIS — F0281 Dementia in other diseases classified elsewhere with behavioral disturbance: Secondary | ICD-10-CM

## 2017-04-22 DIAGNOSIS — I482 Chronic atrial fibrillation, unspecified: Secondary | ICD-10-CM

## 2017-04-22 NOTE — Progress Notes (Signed)
Location:   [facility] Room Number: 161/W Place of Service:  SNF [phone]) Provider:  Henreitta Leber, MD  Patient Care Team: [name], MD as PCP - General (Internal Medicine)  Extended Emergency Contact Information Primary Emergency Contact: [facility] Address: [address]          [place], [place] [address] Johnnette Litter of [place] Phone: [phone] Relation: Son Secondary Emergency Contact: [place], [place] [address] Johnnette Litter of [place] Phone: [phone] Relation: Son  Code Status:  DNR Goals of care: Advanced Directive information Advanced Directives 04/22/2017  Does Patient Have a Medical Advance Directive? Yes  Type of Advance Directive Out of facility DNR (pink MOST or yellow form)  Does patient want to make changes to medical advance directive? No - Patient declined  Copy of [place] in Chart? -  Would patient like information on creating a medical advance directive? -  Pre-existing out of facility DNR order (yellow form or pink MOST form) -    -Chief complaint acute visit follow-up fall--   HPI:  Pt is a 84 y.o. female  --seen today for follow-up of a fall earlier this morning.  Patient does have a history of significant dementia- and desires are for essentially comfort and supportive care without aggressive interventions.  Apparently she fell getting out of her wheelchair and did bump her forehead she does have a small hematoma on the right side of her forehead.  Nursing staff helped her back to her chair and she appears to be at her baseline currently she is bright alert somewhat confused but at her baseline.  She does not complain of any pain at this point.  Vital signs appear to be stable.  I do note she does have a history of atrial fibrillation and is on low-dose Eliquis for anticoagulation-.  She is on fairly minimal meds she is on Seroquel  apparently for some history of psychosis most likely dementia related.  She is however pleasant and actually joking a bit in her wheelchair today      Past Medical History:  Diagnosis Date  . Acute encephalopathy 03/29/2015  . Cardiac arrhythmia 12/13/2012   Per pt, takes atenolol, pradaxa. asymptomatic.   Marland Kitchen Chronic atrial fibrillation ([place])   . Chronic renal insufficiency, stage III (moderate) (HCC)   . CKD (chronic kidney disease) stage 3, GFR 30-59 ml/min (HCC) 09/17/2015  . Closed fracture of neck of left femur ([place])   . Confusion 06/20/2015  . Constipation   . CVA (cerebral infarction) approx 2012   residual defect: poor R sided peripheral vision per son  . Dementia with behavioral disturbance 11/08/2015  . Depression   . Femoral neck fracture ([place]) 12/01/2015  . Hip fracture ([place]) 03/20/2015  . History of depression 12/11/2012   Pt reports 40 pound weight loss. No current Tx.   Marland Kitchen History of fracture of right hip 03/2015  . Hyperlipidemia   . Hypertension   . Hypothyroidism   . Insomnia   . Osteoarthritis    wrists, hands  . Osteoporosis   . Urinary incontinence 12/13/2012  . Vitamin D deficiency 03/29/2015   Past Surgical History:  Procedure Laterality Date  . APPENDECTOMY    . COLONOSCOPY  08/22/08  . INTRAMEDULLARY (IM) NAIL INTERTROCHANTERIC Right 03/22/2015   Procedure: RIGHT femoral HIP NAILING;  Surgeon: Gaynelle Arabian, MD;  [facility];  Service: Orthopedics;  Laterality: Right;  . KNEE ARTHROSCOPY Right   . THYROID SURGERY      No Known Allergies  Outpatient Encounter Medications as of 04/22/2017  Medication Sig  . acetaminophen (TYLENOL) 500 MG tablet Take 2 tablets (1,000 mg total) by mouth every 6 (six) hours as needed for mild pain, moderate pain or headache.  Marland Kitchen apixaban (ELIQUIS) 2.5 MG TABS tablet Take 2.5 mg by mouth 2 (two) times daily. 9am and 5pm  . atenolol (TENORMIN) 50 MG tablet Take 50 mg by mouth daily.  . feeding supplement (BOOST / RESOURCE  BREEZE) LIQD Take 1 Container by mouth 3 (three) times daily between meals.   Marland Kitchen levothyroxine (SYNTHROID, LEVOTHROID) 25 MCG tablet Take 25 mcg by mouth daily before breakfast.  . ondansetron (ZOFRAN-ODT) 4 MG disintegrating tablet Take 4 mg by mouth every 8 (eight) hours as needed for nausea or vomiting.  . pravastatin (PRAVACHOL) 40 MG tablet Take 1 tablet (40 mg total) by mouth at bedtime.  Marland Kitchen QUEtiapine (SEROQUEL) 25 MG tablet Take 25 mg by mouth 2 (two) times daily.   Marland Kitchen senna (SENOKOT) 8.6 MG tablet Take 1 tablet by mouth at bedtime as needed for constipation.   . [DISCONTINUED] tizanidine (ZANAFLEX) 2 MG capsule Take 2 mg by mouth every 8 (eight) hours.    No facility-administered encounter medications on file as of 04/22/2017.     Review of Systems   This is essentially unobtainable secondary to dementia however she is denying any pain-nursing does not report any loss of consciousness before during or after the fall-she appears to be at her baseline currently-she does not complain of any shortness of breath or dizziness-nursing did not note this  either  Immunization History  Administered Date(s) Administered  . Influenza Split 11/20/2012  . Influenza-Unspecified 10/07/2015, 10/18/2016  . PPD Test 08/14/2015  . Pneumococcal Conjugate-13 05/22/2015  . Pneumococcal Polysaccharide-23 08/22/2008  . Tdap 01/01/2015, 08/06/2015, 01/10/2016   Pertinent  Health Maintenance Due  Topic Date Due  . INFLUENZA VACCINE  08/07/2017  . DEXA SCAN  Completed  . PNA vac Low Risk Adult  Completed   Fall Risk  07/24/2016  Falls in the past year? Yes  Number falls in past yr: 2 or more  Injury with Fall? Yes   Functional Status Survey:    Vitals:   04/22/17 1454  BP: 123/82  Pulse: (!) 106  Resp: 18  Temp: (!) 97.4 F (36.3 C)  TempSrc: Oral  Note pulse manually when I took it was 69 I suspect that her initial pulse may have been somewhat elevated secondary to excitement after the fallt      Physical Exam   In general this is a somewhat frail elderly female who appears to be in no distress sitting comfortably in her wheelchair she is somewhat agitated initially with all the attention but then began to smile and joke a bit.  Her skin is warm and dry she does have a small hematoma on her forehead I do not see signs of surrounding erythema or cellulitis.  Eyes sclera conjunctive are clear pupils are equal round reactive to light visual acuity appears to be intact.  Oropharynx is clear tongue is midline with full range of motion-.  Chest is clear to auscultation there is no labored breathing.   Heart is regular irregular rate and rhythm in the 60s she does not really have significant lower extremity edema.  Her abdomen is soft nontender with positive bowel sounds  Musculoskeletal-she has general frailty but is able to move her upper and lower extremities it appears at baseline could not really appreciate pain with gentle range of motion of the legs hips and arms- grip strength appears to be at baseline bilaterally.  Neurologic appears grossly intact as noted above her speech is clear cranial nerves intact.  Psych she is oriented to self is pleasant but confused-she was initially agitated with all the attention but then became good humor it and actually was joking with me.  Pleasant smiling waving goodbye.   Labs reviewed: Recent Labs    05/24/16 12/03/16  NA  --  143  K  --  4.1  BUN 41* 19  CREATININE 1.3* 1.0   Recent Labs    12/03/16  AST 16  ALT 9  ALKPHOS 71   Recent Labs    12/03/16  WBC 5.8  HGB 12.1  HCT 37  PLT 255   Lab Results  Component Value Date   TSH 0.20 (A) 12/03/2016   Lab Results  Component Value Date   HGBA1C 5.4 02/20/2016   Lab Results  Component Value Date   CHOL 117 12/03/2016   HDL 54 12/03/2016   LDLCALC 46 12/03/2016   TRIG 81 12/03/2016    Significant Diagnostic Results in last 30 days:  No results  found.  Assessment/Plan  #1 fall-she does have a small hematoma on her forehead but clinically appears to be stable without further injury- facility will follow fall protocol with neuro checks-vital signs have been stable no sign of distress or changes from baseline on exam this morning-at this point continue to monitor and follow fall protocol.  Emphasis is on supportive care however she will need to be watched again she appears to be doing well at this point  #2 atrial fibrillation this appears rate controlled on atenolol she is on low-dose Eliquis-again I suspect the low-dose is secondary to her fall risk.    [id number] note greater than 25 minutes spent assessing patient-discussing her status with nursing staff reviewing her chart-coordinating a plan of care

## 2017-04-24 DIAGNOSIS — G4700 Insomnia, unspecified: Secondary | ICD-10-CM | POA: Diagnosis not present

## 2017-04-24 DIAGNOSIS — R443 Hallucinations, unspecified: Secondary | ICD-10-CM | POA: Diagnosis not present

## 2017-04-24 DIAGNOSIS — F0390 Unspecified dementia without behavioral disturbance: Secondary | ICD-10-CM | POA: Diagnosis not present

## 2017-04-24 DIAGNOSIS — F29 Unspecified psychosis not due to a substance or known physiological condition: Secondary | ICD-10-CM | POA: Diagnosis not present

## 2017-04-29 DIAGNOSIS — I482 Chronic atrial fibrillation: Secondary | ICD-10-CM | POA: Diagnosis not present

## 2017-04-29 DIAGNOSIS — L603 Nail dystrophy: Secondary | ICD-10-CM | POA: Diagnosis not present

## 2017-04-29 DIAGNOSIS — N184 Chronic kidney disease, stage 4 (severe): Secondary | ICD-10-CM | POA: Diagnosis not present

## 2017-04-29 DIAGNOSIS — R279 Unspecified lack of coordination: Secondary | ICD-10-CM | POA: Diagnosis not present

## 2017-04-29 DIAGNOSIS — B351 Tinea unguium: Secondary | ICD-10-CM | POA: Diagnosis not present

## 2017-04-29 DIAGNOSIS — Z9181 History of falling: Secondary | ICD-10-CM | POA: Diagnosis not present

## 2017-04-29 DIAGNOSIS — R2681 Unsteadiness on feet: Secondary | ICD-10-CM | POA: Diagnosis not present

## 2017-04-29 DIAGNOSIS — M6281 Muscle weakness (generalized): Secondary | ICD-10-CM | POA: Diagnosis not present

## 2017-04-29 DIAGNOSIS — I739 Peripheral vascular disease, unspecified: Secondary | ICD-10-CM | POA: Diagnosis not present

## 2017-04-30 DIAGNOSIS — R279 Unspecified lack of coordination: Secondary | ICD-10-CM | POA: Diagnosis not present

## 2017-04-30 DIAGNOSIS — N184 Chronic kidney disease, stage 4 (severe): Secondary | ICD-10-CM | POA: Diagnosis not present

## 2017-04-30 DIAGNOSIS — I482 Chronic atrial fibrillation: Secondary | ICD-10-CM | POA: Diagnosis not present

## 2017-04-30 DIAGNOSIS — R2681 Unsteadiness on feet: Secondary | ICD-10-CM | POA: Diagnosis not present

## 2017-04-30 DIAGNOSIS — M6281 Muscle weakness (generalized): Secondary | ICD-10-CM | POA: Diagnosis not present

## 2017-04-30 DIAGNOSIS — Z9181 History of falling: Secondary | ICD-10-CM | POA: Diagnosis not present

## 2017-05-01 ENCOUNTER — Non-Acute Institutional Stay (SKILLED_NURSING_FACILITY): Payer: Medicare Other | Admitting: Adult Health

## 2017-05-01 ENCOUNTER — Encounter: Payer: Self-pay | Admitting: Adult Health

## 2017-05-01 DIAGNOSIS — E785 Hyperlipidemia, unspecified: Secondary | ICD-10-CM | POA: Diagnosis not present

## 2017-05-01 DIAGNOSIS — N184 Chronic kidney disease, stage 4 (severe): Secondary | ICD-10-CM | POA: Diagnosis not present

## 2017-05-01 DIAGNOSIS — R2681 Unsteadiness on feet: Secondary | ICD-10-CM | POA: Diagnosis not present

## 2017-05-01 DIAGNOSIS — F02818 Dementia in other diseases classified elsewhere, unspecified severity, with other behavioral disturbance: Secondary | ICD-10-CM

## 2017-05-01 DIAGNOSIS — E034 Atrophy of thyroid (acquired): Secondary | ICD-10-CM

## 2017-05-01 DIAGNOSIS — F0281 Dementia in other diseases classified elsewhere with behavioral disturbance: Secondary | ICD-10-CM | POA: Diagnosis not present

## 2017-05-01 DIAGNOSIS — I482 Chronic atrial fibrillation, unspecified: Secondary | ICD-10-CM

## 2017-05-01 DIAGNOSIS — M6281 Muscle weakness (generalized): Secondary | ICD-10-CM | POA: Diagnosis not present

## 2017-05-01 DIAGNOSIS — Z9181 History of falling: Secondary | ICD-10-CM | POA: Diagnosis not present

## 2017-05-01 DIAGNOSIS — R279 Unspecified lack of coordination: Secondary | ICD-10-CM | POA: Diagnosis not present

## 2017-05-01 DIAGNOSIS — G301 Alzheimer's disease with late onset: Secondary | ICD-10-CM

## 2017-05-01 NOTE — Progress Notes (Signed)
Location:   [place] Room Number: [place] of Service:  SNF (31)   CODE STATUS: DNR  No Known Allergies  Chief Complaint  Patient presents with  . Medical Management of Chronic Issues    Afib; hypothyroidism; dementia.     HPI:  She is a 84 year old long term resident of this facility being seen for the management of her chronic illnesses; afib; hypothyroidism; dementia. She is unable to participate in the hpi or ros. There are no reports of uncontrolled pain; no chest pain; no changes in appetite. There are no nursing concerns at this time.   Past Medical History:  Diagnosis Date  . Acute encephalopathy 03/29/2015  . Cardiac arrhythmia 12/13/2012   Per pt, takes atenolol, pradaxa. asymptomatic.   Marland Kitchen Chronic atrial fibrillation ([place])   . Chronic renal insufficiency, stage III (moderate) (HCC)   . CKD (chronic kidney disease) stage 3, GFR 30-59 ml/min (HCC) 09/17/2015  . Closed fracture of neck of left femur ([place])   . Confusion 06/20/2015  . Constipation   . CVA (cerebral infarction) approx 2012   residual defect: poor R sided peripheral vision per son  . Dementia with behavioral disturbance 11/08/2015  . Depression   . Femoral neck fracture ([place]) 12/01/2015  . Hip fracture ([place]) 03/20/2015  . History of depression 12/11/2012   Pt reports 40 pound weight loss. No current Tx.   Marland Kitchen History of fracture of right hip 03/2015  . Hyperlipidemia   . Hypertension   . Hypothyroidism   . Insomnia   . Osteoarthritis    wrists, hands  . Osteoporosis   . Urinary incontinence 12/13/2012  . Vitamin D deficiency 03/29/2015    Past Surgical History:  Procedure Laterality Date  . APPENDECTOMY    . COLONOSCOPY  08/22/08  . INTRAMEDULLARY (IM) NAIL INTERTROCHANTERIC Right 03/22/2015   Procedure: RIGHT femoral HIP NAILING;  Surgeon: Gaynelle Arabian, MD;  Location: [facility];  Service: Orthopedics;  Laterality: Right;  . KNEE ARTHROSCOPY Right   . THYROID SURGERY        Social History   Socioeconomic History  . Marital status: Widowed    Spouse name: Not on file  . Number of children: Not on file  . Years of education: Not on file  . Highest education level: Not on file  Occupational History  . Occupation: retired Corporate treasurer  Social Needs  . Financial resource strain: Not on file  . Food insecurity:    Worry: Not on file    Inability: Not on file  . Transportation needs:    Medical: Not on file    Non-medical: Not on file  Tobacco Use  . Smoking status: Never Smoker  . Smokeless tobacco: Never Used  Substance and Sexual Activity  . Alcohol use: No  . Drug use: No  . Sexual activity: Never  Lifestyle  . Physical activity:    Days per week: Not on file    Minutes per session: Not on file  . Stress: Not on file  Relationships  . Social connections:    Talks on phone: Not on file    Gets together: Not on file    Attends religious service: Not on file    Active member of club or organization: Not on file    Attends meetings of clubs or organizations: Not on file    Relationship status: Not on file  . Intimate partner violence:    Fear of  current or ex partner: Not on file    Emotionally abused: Not on file    Physically abused: Not on file    Forced sexual activity: Not on file  Other Topics Concern  . Not on file  Social History Narrative   Widow.   Educ: 11th grade.   Occupation: retired Insurance claims handler and retired Secretary/administrator.   As of 02/2015, pt is in independent living at [facility] in [place].   No T/A/Ds.   Admitted to [facility] 08/14/15   Never smoked   Alcohol none   DNR   Family History  Problem Relation Age of Onset  . Stroke Mother   . Diabetes Mother   . Alcohol abuse Father       VITAL SIGNS BP 136/74   Pulse 87   Temp (!) 97.3 F (36.3 C)   Resp 17   Ht 5\' 4"  (1.626 m)   Wt 111 lb (50.3 kg)   LMP 01/08/1972 (LMP Unknown)   BMI 19.05 kg/m   Outpatient Encounter Medications as of  05/01/2017  Medication Sig  . acetaminophen (TYLENOL) 500 MG tablet Take 2 tablets (1,000 mg total) by mouth every 6 (six) hours as needed for mild pain, moderate pain or headache.  Marland Kitchen apixaban (ELIQUIS) 2.5 MG TABS tablet Take 2.5 mg by mouth 2 (two) times daily. 9am and 5pm  . atenolol (TENORMIN) 50 MG tablet Take 50 mg by mouth daily.  . feeding supplement (BOOST / RESOURCE BREEZE) LIQD Take 1 Container by mouth 3 (three) times daily between meals.   Marland Kitchen levothyroxine (SYNTHROID, LEVOTHROID) 25 MCG tablet Take 25 mcg by mouth daily before breakfast.  . ondansetron (ZOFRAN-ODT) 4 MG disintegrating tablet Take 4 mg by mouth every 8 (eight) hours as needed for nausea or vomiting.  . pravastatin (PRAVACHOL) 40 MG tablet Take 1 tablet (40 mg total) by mouth at bedtime.  Marland Kitchen QUEtiapine (SEROQUEL) 25 MG tablet Take 25 mg by mouth 2 (two) times daily.   Marland Kitchen senna (SENOKOT) 8.6 MG tablet Take 1 tablet by mouth at bedtime as needed for constipation.   Marland Kitchen tiZANidine (ZANAFLEX) 2 MG tablet Take 2 mg by mouth every 6 (six) hours.   No facility-administered encounter medications on file as of 05/01/2017.      SIGNIFICANT DIAGNOSTIC EXAMS  No recent labs   Review of Systems  Unable to perform ROS: Dementia (unable to participate )     Physical Exam  Constitutional: No distress.  Frail   Neck: No thyromegaly present.  Cardiovascular: Normal rate, regular rhythm, normal heart sounds and intact distal pulses.  Pulmonary/Chest: Effort normal and breath sounds normal. No respiratory distress.  Abdominal: Soft. Bowel sounds are normal. She exhibits no distension. There is no tenderness.  Musculoskeletal: She exhibits no edema.  Is able to move all extremities   Lymphadenopathy:    She has no cervical adenopathy.  Neurological: She is alert.  Skin: Skin is warm and dry. She is not diaphoretic.  Psychiatric: She has a normal mood and affect.      ASSESSMENT/ PLAN:  TODAY:   1. Essential  hypertension benign: stable b/p 136/74: will continue tenormin 50 mg nightly   2. Chronic atrial fibrillation: heart rate stable: will continue tenormin 50 mg nightly for rate control; and on eliquid 2.5 mg twice daily  3. Hypothyroidism due to acquired atrophy of thyroid: stable will continue synthroid 25 mcg daily   4. Dementia with behavioral disturbance: without change in  status; weight is 111 pounds; will monitor   5. CKD stage 3: no change in status will check renal function  6. Hyperlipidemia: is stable will stop pravachol at this time; this medication more than likely is not providing her with any benefit  7. Mood disorder: is stable will continue seroquel 25 mg twice daily will monitor   MD is aware of resident's narcotic use and is in agreement with current plan of care. We will attempt to wean resident as apropriate   Ok Edwards NP [facility]  Contact [phone] Monday through Friday 8am- 5pm  After hours call [phone]

## 2017-05-02 DIAGNOSIS — I482 Chronic atrial fibrillation: Secondary | ICD-10-CM | POA: Diagnosis not present

## 2017-05-02 DIAGNOSIS — N184 Chronic kidney disease, stage 4 (severe): Secondary | ICD-10-CM | POA: Diagnosis not present

## 2017-05-02 DIAGNOSIS — Z9181 History of falling: Secondary | ICD-10-CM | POA: Diagnosis not present

## 2017-05-02 DIAGNOSIS — R2681 Unsteadiness on feet: Secondary | ICD-10-CM | POA: Diagnosis not present

## 2017-05-02 DIAGNOSIS — I10 Essential (primary) hypertension: Secondary | ICD-10-CM | POA: Diagnosis not present

## 2017-05-02 DIAGNOSIS — M6281 Muscle weakness (generalized): Secondary | ICD-10-CM | POA: Diagnosis not present

## 2017-05-02 DIAGNOSIS — D649 Anemia, unspecified: Secondary | ICD-10-CM | POA: Diagnosis not present

## 2017-05-02 DIAGNOSIS — R279 Unspecified lack of coordination: Secondary | ICD-10-CM | POA: Diagnosis not present

## 2017-05-02 DIAGNOSIS — E039 Hypothyroidism, unspecified: Secondary | ICD-10-CM | POA: Diagnosis not present

## 2017-05-05 DIAGNOSIS — N184 Chronic kidney disease, stage 4 (severe): Secondary | ICD-10-CM | POA: Diagnosis not present

## 2017-05-05 DIAGNOSIS — R279 Unspecified lack of coordination: Secondary | ICD-10-CM | POA: Diagnosis not present

## 2017-05-05 DIAGNOSIS — R2681 Unsteadiness on feet: Secondary | ICD-10-CM | POA: Diagnosis not present

## 2017-05-05 DIAGNOSIS — M6281 Muscle weakness (generalized): Secondary | ICD-10-CM | POA: Diagnosis not present

## 2017-05-05 DIAGNOSIS — I482 Chronic atrial fibrillation: Secondary | ICD-10-CM | POA: Diagnosis not present

## 2017-05-05 DIAGNOSIS — Z9181 History of falling: Secondary | ICD-10-CM | POA: Diagnosis not present

## 2017-05-06 DIAGNOSIS — R279 Unspecified lack of coordination: Secondary | ICD-10-CM | POA: Diagnosis not present

## 2017-05-06 DIAGNOSIS — M6281 Muscle weakness (generalized): Secondary | ICD-10-CM | POA: Diagnosis not present

## 2017-05-06 DIAGNOSIS — I482 Chronic atrial fibrillation: Secondary | ICD-10-CM | POA: Diagnosis not present

## 2017-05-06 DIAGNOSIS — R2681 Unsteadiness on feet: Secondary | ICD-10-CM | POA: Diagnosis not present

## 2017-05-06 DIAGNOSIS — N184 Chronic kidney disease, stage 4 (severe): Secondary | ICD-10-CM | POA: Diagnosis not present

## 2017-05-06 DIAGNOSIS — Z9181 History of falling: Secondary | ICD-10-CM | POA: Diagnosis not present

## 2017-05-07 DIAGNOSIS — M6281 Muscle weakness (generalized): Secondary | ICD-10-CM | POA: Diagnosis not present

## 2017-05-07 DIAGNOSIS — N184 Chronic kidney disease, stage 4 (severe): Secondary | ICD-10-CM | POA: Diagnosis not present

## 2017-05-07 DIAGNOSIS — Z9181 History of falling: Secondary | ICD-10-CM | POA: Diagnosis not present

## 2017-05-07 DIAGNOSIS — R279 Unspecified lack of coordination: Secondary | ICD-10-CM | POA: Diagnosis not present

## 2017-05-07 DIAGNOSIS — I482 Chronic atrial fibrillation: Secondary | ICD-10-CM | POA: Diagnosis not present

## 2017-05-22 DIAGNOSIS — H04123 Dry eye syndrome of bilateral lacrimal glands: Secondary | ICD-10-CM | POA: Diagnosis not present

## 2017-05-22 DIAGNOSIS — H43813 Vitreous degeneration, bilateral: Secondary | ICD-10-CM | POA: Diagnosis not present

## 2017-05-22 DIAGNOSIS — Z961 Presence of intraocular lens: Secondary | ICD-10-CM | POA: Diagnosis not present

## 2017-05-22 DIAGNOSIS — F0390 Unspecified dementia without behavioral disturbance: Secondary | ICD-10-CM | POA: Diagnosis not present

## 2017-05-28 ENCOUNTER — Non-Acute Institutional Stay (SKILLED_NURSING_FACILITY): Payer: Medicare Other | Admitting: Internal Medicine

## 2017-05-28 ENCOUNTER — Encounter: Payer: Self-pay | Admitting: Internal Medicine

## 2017-05-28 DIAGNOSIS — E034 Atrophy of thyroid (acquired): Secondary | ICD-10-CM | POA: Diagnosis not present

## 2017-05-28 DIAGNOSIS — I10 Essential (primary) hypertension: Secondary | ICD-10-CM

## 2017-05-28 DIAGNOSIS — F39 Unspecified mood [affective] disorder: Secondary | ICD-10-CM

## 2017-05-28 NOTE — Progress Notes (Signed)
Location:  [place] Room Number: 311-W Place of Service:  SNF (31)  [name], MD  Patient Care Team: [name], MD as PCP - General (Internal Medicine)  Extended Emergency Contact Information Primary Emergency Contact: [facility] Address: [address]          [place], [place] [address] Johnnette Litter of [place] Phone: [phone] Relation: Son Secondary Emergency Contact: [place], [place] [address] Johnnette Litter of [place] Phone: [phone] Relation: Son    Allergies: Patient has no known allergies.  Chief Complaint  Patient presents with  . Medical Management of Chronic Issues    Routine Visit    HPI: Patient is 84 y.o. female who is being seen for routine issues of mood disorder, hypothyroidism and hypertension.  Past Medical History:  Diagnosis Date  . Acute encephalopathy 03/29/2015  . Cardiac arrhythmia 12/13/2012   Per pt, takes atenolol, pradaxa. asymptomatic.   Marland Kitchen Chronic atrial fibrillation ([place])   . Chronic renal insufficiency, stage III (moderate) (HCC)   . CKD (chronic kidney disease) stage 3, GFR 30-59 ml/min (HCC) 09/17/2015  . Closed fracture of neck of left femur ([place])   . Confusion 06/20/2015  . Constipation   . CVA (cerebral infarction) approx 2012   residual defect: poor R sided peripheral vision per son  . Dementia with behavioral disturbance 11/08/2015  . Depression   . Femoral neck fracture ([place]) 12/01/2015  . Hip fracture ([place]) 03/20/2015  . History of depression 12/11/2012   Pt reports 40 pound weight loss. No current Tx.   Marland Kitchen History of fracture of right hip 03/2015  . Hyperlipidemia   . Hypertension   . Hypothyroidism   . Insomnia   . Osteoarthritis    wrists, hands  . Osteoporosis   . Urinary incontinence 12/13/2012  . Vitamin D deficiency 03/29/2015    Past Surgical History:  Procedure Laterality Date  . APPENDECTOMY    . COLONOSCOPY  08/22/08  .  INTRAMEDULLARY (IM) NAIL INTERTROCHANTERIC Right 03/22/2015   Procedure: RIGHT femoral HIP NAILING;  Surgeon: Gaynelle Arabian, MD;  [facility];  Service: Orthopedics;  Laterality: Right;  . KNEE ARTHROSCOPY Right   . THYROID SURGERY      Allergies as of 05/28/2017   No Known Allergies     Medication List        Accurate as of 05/28/17 11:59 PM. Always use your most recent med list.          acetaminophen 500 MG tablet Commonly known as:  TYLENOL Take 2 tablets (1,000 mg total) by mouth every 6 (six) hours as needed for mild pain, moderate pain or headache.   atenolol 50 MG tablet Commonly known as:  TENORMIN Take 50 mg by mouth daily.   ELIQUIS 2.5 MG Tabs tablet Generic drug:  apixaban Take 2.5 mg by mouth 2 (two) times daily. 9am and 5pm   feeding supplement Liqd Take 1 Container by mouth 3 (three) times daily between meals.   levothyroxine 25 MCG tablet Commonly known as:  SYNTHROID, LEVOTHROID Take 25 mcg by mouth daily before breakfast.   ondansetron 4 MG disintegrating tablet Commonly known as:  ZOFRAN-ODT Take 4 mg by mouth every 8 (eight) hours as needed for nausea or vomiting.   QUEtiapine 25 MG tablet Commonly known as:  SEROQUEL Take 25 mg by mouth 2 (two) times daily.   senna 8.6 MG  tablet Commonly known as:  SENOKOT Take 1 tablet by mouth at bedtime as needed for constipation.   tiZANidine 2 MG tablet Commonly known as:  ZANAFLEX Take 2 mg by mouth every 6 (six) hours.       No orders of the defined types were placed in this encounter.   Immunization History  Administered Date(s) Administered  . Influenza Split 11/20/2012  . Influenza-Unspecified 10/07/2015, 10/18/2016  . PPD Test 08/14/2015  . Pneumococcal Conjugate-13 05/22/2015  . Pneumococcal Polysaccharide-23 08/22/2008  . Tdap 01/01/2015, 08/06/2015, 01/10/2016    Social History   Tobacco Use  . Smoking status: Never Smoker  . Smokeless tobacco: Never Used  Substance Use  Topics  . Alcohol use: No    Review of Systems unable to obtain secondary to dementia; nursing-no acute concerns    Vitals:   05/28/17 1153  BP: 132/74  Pulse: 66  Resp: 18  Temp: 98 F (36.7 C)  SpO2: 93%   Body mass index is 19.05 kg/m. Physical Exam  GENERAL APPEARANCE: Alert, conversant, No acute distress  SKIN: No diaphoresis rash HEENT: Unremarkable RESPIRATORY: Breathing is even, unlabored. Lung sounds are clear   CARDIOVASCULAR: Heart irregular no murmurs, rubs or gallops. No peripheral edema  GASTROINTESTINAL: Abdomen is soft, non-tender, not distended w/ normal bowel sounds.  GENITOURINARY: Bladder non tender, not distended  MUSCULOSKELETAL: No abnormal joints or musculature NEUROLOGIC: Cranial nerves 2-12 grossly intact. Moves all extremities PSYCHIATRIC: Dementia, no behavioral issues  Patient Active Problem List   Diagnosis Date Noted  . Failure to thrive in adult 07/14/2016  . Femoral neck fracture ([place]) 12/01/2015  . Malnutrition of moderate degree 12/01/2015  . Closed fracture of neck of left femur ([place])   . Mood disorder ([place]) 11/19/2015  . Dementia with behavioral disturbance 11/08/2015  . CKD (chronic kidney disease) stage 3, GFR 30-59 ml/min (HCC) 09/17/2015  . Insomnia 08/18/2015  . Confusion 06/20/2015  . Recent urinary tract infection 06/20/2015  . Dysuria 04/10/2015  . Edema 04/06/2015  . Acute encephalopathy 03/29/2015  . Vitamin D deficiency 03/29/2015  . UTI (lower urinary tract infection) 03/21/2015  . Hip fracture ([place]) 03/20/2015  . Chronic atrial fibrillation ([place]) 02/20/2015  . Hyperlipidemia 02/20/2015  . Essential hypertension, benign 12/13/2012  . Cardiac arrhythmia 12/13/2012  . Urinary incontinence 12/13/2012  . Hypothyroidism 12/13/2012  . History of depression 12/11/2012    CMP     Component Value Date/Time   NA 143 12/03/2016   K 4.1 12/03/2016   CL 103 01/10/2016 0030   CO2 23 01/10/2016 0030   GLUCOSE 111 (H)  01/10/2016 0030   BUN 19 12/03/2016   CREATININE 1.0 12/03/2016   CREATININE 1.26 (H) 01/10/2016 0030   CALCIUM 8.6 (L) 01/10/2016 0030   PROT 6.9 12/01/2015 0506   ALBUMIN 3.6 12/01/2015 0506   AST 16 12/03/2016   ALT 9 12/03/2016   ALKPHOS 71 12/03/2016   BILITOT 0.6 12/01/2015 0506   GFRNONAA 36 (L) 01/10/2016 0030   GFRAA 41 (L) 01/10/2016 0030   Recent Labs    12/03/16  NA 143  K 4.1  BUN 19  CREATININE 1.0   Recent Labs    12/03/16  AST 16  ALT 9  ALKPHOS 71   Recent Labs    12/03/16  WBC 5.8  HGB 12.1  HCT 37  PLT 255   Recent Labs    12/03/16  CHOL 117  LDLCALC 46  TRIG 81   No results found for: [facility]  Results  Component Value Date   TSH 0.20 (A) 12/03/2016   Lab Results  Component Value Date   HGBA1C 5.4 02/20/2016   Lab Results  Component Value Date   CHOL 117 12/03/2016   HDL 54 12/03/2016   LDLCALC 46 12/03/2016   TRIG 81 12/03/2016    Significant Diagnostic Results in last 30 days:  No results found.  Assessment and Plan  Mood disorder ([place]) Better controlled; continue Seroquel 25 mg twice daily  Hypothyroidism Stable; continue Synthroid 25 mcg daily  Essential hypertension, benign Controlled; continue Tenormin 50 mg daily    Jenisis Harmsen D. [name], MD

## 2017-06-03 DIAGNOSIS — G4700 Insomnia, unspecified: Secondary | ICD-10-CM | POA: Diagnosis not present

## 2017-06-03 DIAGNOSIS — F0390 Unspecified dementia without behavioral disturbance: Secondary | ICD-10-CM | POA: Diagnosis not present

## 2017-06-03 DIAGNOSIS — R443 Hallucinations, unspecified: Secondary | ICD-10-CM | POA: Diagnosis not present

## 2017-06-03 DIAGNOSIS — F419 Anxiety disorder, unspecified: Secondary | ICD-10-CM | POA: Diagnosis not present

## 2017-06-19 ENCOUNTER — Non-Acute Institutional Stay (SKILLED_NURSING_FACILITY): Payer: Medicare Other | Admitting: Internal Medicine

## 2017-06-19 ENCOUNTER — Encounter: Payer: Self-pay | Admitting: Internal Medicine

## 2017-06-19 DIAGNOSIS — N183 Chronic kidney disease, stage 3 unspecified: Secondary | ICD-10-CM

## 2017-06-19 DIAGNOSIS — F0281 Dementia in other diseases classified elsewhere with behavioral disturbance: Secondary | ICD-10-CM | POA: Diagnosis not present

## 2017-06-19 DIAGNOSIS — G301 Alzheimer's disease with late onset: Secondary | ICD-10-CM

## 2017-06-19 DIAGNOSIS — I482 Chronic atrial fibrillation, unspecified: Secondary | ICD-10-CM

## 2017-06-19 DIAGNOSIS — F02818 Dementia in other diseases classified elsewhere, unspecified severity, with other behavioral disturbance: Secondary | ICD-10-CM

## 2017-06-19 NOTE — Progress Notes (Signed)
Location:  [facility] Room Number: 311-W Place of Service:  SNF (31)  [name], MD  Patient Care Team: [name], MD as PCP - General (Internal Medicine)  Extended Emergency Contact Information Primary Emergency Contact: [facility] Address: [address]          [place], [place] [address] Johnnette Litter of [place] Phone: [phone] Relation: Son Secondary Emergency Contact: [place], [place] [address] Johnnette Litter of [place] Phone: [phone] Relation: Son    Allergies: Patient has no known allergies.  Chief Complaint  Patient presents with  . Medical Management of Chronic Issues    Chronic Visit    HPI: Patient is 84 y.o. female who is being seen for routine issues of chronic atrial fib, dementia, and chronic kidney disease stage III.  Past Medical History:  Diagnosis Date  . Acute encephalopathy 03/29/2015  . Cardiac arrhythmia 12/13/2012   Per pt, takes atenolol, pradaxa. asymptomatic.   Marland Kitchen Chronic atrial fibrillation ([place])   . Chronic renal insufficiency, stage III (moderate) (HCC)   . CKD (chronic kidney disease) stage 3, GFR 30-59 ml/min (HCC) 09/17/2015  . Closed fracture of neck of left femur ([place])   . Confusion 06/20/2015  . Constipation   . CVA (cerebral infarction) approx 2012   residual defect: poor R sided peripheral vision per son  . Dementia with behavioral disturbance 11/08/2015  . Depression   . Femoral neck fracture ([place]) 12/01/2015  . Hip fracture ([place]) 03/20/2015  . History of depression 12/11/2012   Pt reports 40 pound weight loss. No current Tx.   Marland Kitchen History of fracture of right hip 03/2015  . Hyperlipidemia   . Hypertension   . Hypothyroidism   . Insomnia   . Osteoarthritis    wrists, hands  . Osteoporosis   . Urinary incontinence 12/13/2012  . Vitamin D deficiency 03/29/2015    Past Surgical History:  Procedure Laterality Date  . APPENDECTOMY    . COLONOSCOPY   08/22/08  . INTRAMEDULLARY (IM) NAIL INTERTROCHANTERIC Right 03/22/2015   Procedure: RIGHT femoral HIP NAILING;  Surgeon: Gaynelle Arabian, MD;  [facility];  Service: Orthopedics;  Laterality: Right;  . KNEE ARTHROSCOPY Right   . THYROID SURGERY      Allergies as of 06/19/2017   No Known Allergies     Medication List        Accurate as of 06/19/17 11:59 PM. Always use your most recent med list.          acetaminophen 500 MG tablet Commonly known as:  TYLENOL Take 2 tablets (1,000 mg total) by mouth every 6 (six) hours as needed for mild pain, moderate pain or headache.   atenolol 50 MG tablet Commonly known as:  TENORMIN Take 50 mg by mouth daily.   ELIQUIS 2.5 MG Tabs tablet Generic drug:  apixaban Take 2.5 mg by mouth 2 (two) times daily. 9am and 5pm   feeding supplement Liqd Take 1 Container by mouth 3 (three) times daily between meals.   levothyroxine 25 MCG tablet Commonly known as:  SYNTHROID, LEVOTHROID Take 25 mcg by mouth daily before breakfast.   ondansetron 4 MG disintegrating tablet Commonly known as:  ZOFRAN-ODT Take 4 mg by mouth every 8 (eight) hours as needed for nausea or vomiting.   QUEtiapine 25 MG tablet Commonly known as:  SEROQUEL Take 25 mg by mouth 2 (two) times daily.  senna 8.6 MG tablet Commonly known as:  SENOKOT Take 1 tablet by mouth at bedtime as needed for constipation.   tiZANidine 2 MG tablet Commonly known as:  ZANAFLEX Take 2 mg by mouth every 6 (six) hours.       No orders of the defined types were placed in this encounter.   Immunization History  Administered Date(s) Administered  . Influenza Split 11/20/2012  . Influenza-Unspecified 10/07/2015, 10/18/2016  . PPD Test 08/14/2015  . Pneumococcal Conjugate-13 05/22/2015  . Pneumococcal Polysaccharide-23 08/22/2008  . Tdap 01/01/2015, 08/06/2015, 01/10/2016    Social History   Tobacco Use  . Smoking status: Never Smoker  . Smokeless tobacco: Never Used    Substance Use Topics  . Alcohol use: No    Review of Systems unable to obtain secondary to dementia; nursing-no acute concerns    Vitals:   06/19/17 1136  BP: 116/68  Pulse: 74  Resp: 18  Temp: (!) 97.4 F (36.3 C)   Body mass index is 18.85 kg/m. Physical Exam  GENERAL APPEARANCE: Alert, conversant, No acute distress  SKIN: No diaphoresis rash HEENT: Unremarkable RESPIRATORY: Breathing is even, unlabored. Lung sounds are clear   CARDIOVASCULAR: Heart regular no murmurs, rubs or gallops. No peripheral edema  GASTROINTESTINAL: Abdomen is soft, non-tender, not distended w/ normal bowel sounds.  GENITOURINARY: Bladder non tender, not distended  MUSCULOSKELETAL: No abnormal joints or musculature NEUROLOGIC: Cranial nerves 2-12 grossly intact. Moves all extremities PSYCHIATRIC: Dementia, no behavioral issues  Patient Active Problem List   Diagnosis Date Noted  . Failure to thrive in adult 07/14/2016  . Femoral neck fracture ([place]) 12/01/2015  . Malnutrition of moderate degree 12/01/2015  . Closed fracture of neck of left femur ([place])   . Mood disorder ([place]) 11/19/2015  . Dementia with behavioral disturbance 11/08/2015  . CKD (chronic kidney disease) stage 3, GFR 30-59 ml/min (HCC) 09/17/2015  . Insomnia 08/18/2015  . Confusion 06/20/2015  . Recent urinary tract infection 06/20/2015  . Dysuria 04/10/2015  . Edema 04/06/2015  . Acute encephalopathy 03/29/2015  . Vitamin D deficiency 03/29/2015  . UTI (lower urinary tract infection) 03/21/2015  . Hip fracture ([place]) 03/20/2015  . Chronic atrial fibrillation ([place]) 02/20/2015  . Hyperlipidemia 02/20/2015  . Essential hypertension, benign 12/13/2012  . Cardiac arrhythmia 12/13/2012  . Urinary incontinence 12/13/2012  . Hypothyroidism 12/13/2012  . History of depression 12/11/2012    CMP     Component Value Date/Time   NA 143 12/03/2016   K 4.1 12/03/2016   CL 103 01/10/2016 0030   CO2 23 01/10/2016 0030   GLUCOSE  111 (H) 01/10/2016 0030   BUN 19 12/03/2016   CREATININE 1.0 12/03/2016   CREATININE 1.26 (H) 01/10/2016 0030   CALCIUM 8.6 (L) 01/10/2016 0030   PROT 6.9 12/01/2015 0506   ALBUMIN 3.6 12/01/2015 0506   AST 16 12/03/2016   ALT 9 12/03/2016   ALKPHOS 71 12/03/2016   BILITOT 0.6 12/01/2015 0506   GFRNONAA 36 (L) 01/10/2016 0030   GFRAA 41 (L) 01/10/2016 0030   Recent Labs    12/03/16  NA 143  K 4.1  BUN 19  CREATININE 1.0   Recent Labs    12/03/16  AST 16  ALT 9  ALKPHOS 71   Recent Labs    12/03/16  WBC 5.8  HGB 12.1  HCT 37  PLT 255   Recent Labs    12/03/16  CHOL 117  LDLCALC 46  TRIG 81   No results found for:  [facility] Lab Results  Component Value Date   TSH 0.20 (A) 12/03/2016   Lab Results  Component Value Date   HGBA1C 5.4 02/20/2016   Lab Results  Component Value Date   CHOL 117 12/03/2016   HDL 54 12/03/2016   LDLCALC 46 12/03/2016   TRIG 81 12/03/2016    Significant Diagnostic Results in last 30 days:  No results found.  Assessment and Plan  Chronic atrial fibrillation (HCC) Stable; continue Tenormin 50 mg daily for rate and Eliquis 2.5 mg twice daily as prophylaxis  Dementia with behavioral disturbance Chronic; downward trajectory  but no major declines; on no meds, continue supportive care  CKD (chronic kidney disease) stage 3, GFR 30-59 ml/min GFR 50, stable from prior; still stage III which is excellent for age; will monitor intervals    [name], MD

## 2017-06-26 DIAGNOSIS — G4700 Insomnia, unspecified: Secondary | ICD-10-CM | POA: Diagnosis not present

## 2017-06-26 DIAGNOSIS — F0390 Unspecified dementia without behavioral disturbance: Secondary | ICD-10-CM | POA: Diagnosis not present

## 2017-06-26 DIAGNOSIS — R4589 Other symptoms and signs involving emotional state: Secondary | ICD-10-CM | POA: Diagnosis not present

## 2017-06-26 DIAGNOSIS — F419 Anxiety disorder, unspecified: Secondary | ICD-10-CM | POA: Diagnosis not present

## 2017-06-28 ENCOUNTER — Encounter: Payer: Self-pay | Admitting: Internal Medicine

## 2017-06-28 NOTE — Assessment & Plan Note (Signed)
Stable; continue Synthroid 25 mcg daily

## 2017-06-28 NOTE — Assessment & Plan Note (Signed)
Better controlled; continue Seroquel 25 mg twice daily

## 2017-06-28 NOTE — Assessment & Plan Note (Signed)
Controlled; continue Tenormin 50 mg daily 

## 2017-07-12 ENCOUNTER — Encounter: Payer: Self-pay | Admitting: Internal Medicine

## 2017-07-12 NOTE — Assessment & Plan Note (Signed)
Stable; continue Tenormin 50 mg daily for rate and Eliquis 2.5 mg twice daily as prophylaxis

## 2017-07-12 NOTE — Assessment & Plan Note (Signed)
Chronic; downward trajectory  but no major declines; on no meds, continue supportive care

## 2017-07-12 NOTE — Assessment & Plan Note (Signed)
GFR 50, stable from prior; still stage III which is excellent for age; will monitor intervals

## 2017-07-16 ENCOUNTER — Encounter: Payer: Self-pay | Admitting: Internal Medicine

## 2017-07-16 ENCOUNTER — Non-Acute Institutional Stay (SKILLED_NURSING_FACILITY): Payer: Medicare Other | Admitting: Internal Medicine

## 2017-07-16 DIAGNOSIS — E785 Hyperlipidemia, unspecified: Secondary | ICD-10-CM | POA: Diagnosis not present

## 2017-07-16 DIAGNOSIS — F39 Unspecified mood [affective] disorder: Secondary | ICD-10-CM | POA: Diagnosis not present

## 2017-07-16 DIAGNOSIS — E034 Atrophy of thyroid (acquired): Secondary | ICD-10-CM

## 2017-07-16 NOTE — Progress Notes (Signed)
Location:  [place] Room Number: [phone] Place of Service:  SNF [phone])  Marie Robertson. Marie Coil, MD  Patient Care Team: [name], MD as PCP - General (Internal Medicine)  Extended Emergency Contact Information Primary Emergency Contact: [facility] Address: [address]          [place], [place] [address] Johnnette Litter of [place] Phone: [phone] Relation: Son Secondary Emergency Contact: [place], [place] [address] Johnnette Litter of [place] Phone: [phone] Relation: Son    Allergies: Patient has no known allergies.  Chief Complaint  Patient presents with  . Medical Management of Chronic Issues    Routine visit    HPI: Patient is 84 y.o. female who is being seen for routine issues of hyperlipidemia, mood disorder, and hypothyroidism.  Past Medical History:  Diagnosis Date  . Acute encephalopathy 03/29/2015  . Cardiac arrhythmia 12/13/2012   Per pt, takes atenolol, pradaxa. asymptomatic.   Marland Kitchen Chronic atrial fibrillation ([place])   . Chronic renal insufficiency, stage III (moderate) (HCC)   . CKD (chronic kidney disease) stage 3, GFR 30-59 ml/min (HCC) 09/17/2015  . Closed fracture of neck of left femur ([place])   . Confusion 06/20/2015  . Constipation   . CVA (cerebral infarction) approx 2012   residual defect: poor R sided peripheral vision per son  . Dementia with behavioral disturbance 11/08/2015  . Depression   . Femoral neck fracture ([place]) 12/01/2015  . Hip fracture ([place]) 03/20/2015  . History of depression 12/11/2012   Pt reports 40 pound weight loss. No current Tx.   Marland Kitchen History of fracture of right hip 03/2015  . Hyperlipidemia   . Hypertension   . Hypothyroidism   . Insomnia   . Osteoarthritis    wrists, hands  . Osteoporosis   . Urinary incontinence 12/13/2012  . Vitamin D deficiency 03/29/2015    Past Surgical History:  Procedure Laterality Date  . APPENDECTOMY    . COLONOSCOPY  08/22/08  .  INTRAMEDULLARY (IM) NAIL INTERTROCHANTERIC Right 03/22/2015   Procedure: RIGHT femoral HIP NAILING;  Surgeon: Gaynelle Arabian, MD;  [facility]: [facility];  Service: Orthopedics;  Laterality: Right;  . KNEE ARTHROSCOPY Right   . THYROID SURGERY      Allergies as of 07/16/2017   No Known Allergies     Medication List        Accurate as of 07/16/17 11:59 PM. Always use your most recent med list.          acetaminophen 500 MG tablet Commonly known as:  TYLENOL Take 2 tablets (1,000 mg total) by mouth every 6 (six) hours as needed for mild pain, moderate pain or headache.   atenolol 50 MG tablet Commonly known as:  TENORMIN Take 50 mg by mouth daily.   ELIQUIS 2.5 MG Tabs tablet Generic drug:  apixaban Take 2.5 mg by mouth 2 (two) times daily. 9am and 5pm   feeding supplement Liqd Take 1 Container by mouth 3 (three) times daily between meals.   levothyroxine 25 MCG tablet Commonly known as:  SYNTHROID, LEVOTHROID Take 25 mcg by mouth daily before breakfast.   ondansetron 4 MG disintegrating tablet Commonly known as:  ZOFRAN-ODT Take 4 mg by mouth every 8 (eight) hours as needed for nausea or vomiting.   QUEtiapine 25 MG tablet Commonly known as:  SEROQUEL Take 25 mg by mouth 2 (two) times daily.   senna 8.6 MG tablet  Commonly known as:  SENOKOT Take 1 tablet by mouth at bedtime as needed for constipation.   tiZANidine 2 MG tablet Commonly known as:  ZANAFLEX Take 2 mg by mouth every 6 (six) hours.       No orders of the defined types were placed in this encounter.   Immunization History  Administered Date(s) Administered  . Influenza Split 11/20/2012  . Influenza-Unspecified 10/07/2015, 10/18/2016  . PPD Test 08/14/2015  . Pneumococcal Conjugate-13 05/22/2015  . Pneumococcal Polysaccharide-23 08/22/2008  . Tdap 01/01/2015, 08/06/2015, 01/10/2016    Social History   Tobacco Use  . Smoking status: Never Smoker  . Smokeless tobacco: Never Used  Substance Use  Topics  . Alcohol use: No    Review of Systems unable to obtain secondary to dementia; nursing- no acute concerns    Vitals:   07/16/17 1416  BP: 104/70  Pulse: 62  Resp: 20  Temp: (!) 97.3 F (36.3 C)  SpO2: 97%   Body mass index is 18.71 kg/m. Physical Exam  GENERAL APPEARANCE: Alert, conversant, No acute distress  SKIN: No diaphoresis rash HEENT: Unremarkable RESPIRATORY: Breathing is even, unlabored. Lung sounds are clear   CARDIOVASCULAR: Heart RRR no murmurs, rubs or gallops. No peripheral edema  GASTROINTESTINAL: Abdomen is soft, non-tender, not distended w/ normal bowel sounds.  GENITOURINARY: Bladder non tender, not distended  MUSCULOSKELETAL: No abnormal joints or musculature NEUROLOGIC: Cranial nerves 2-12 grossly intact. Moves all extremities PSYCHIATRIC: Dementia, no behavioral issues  Patient Active Problem List   Diagnosis Date Noted  . Failure to thrive in adult 07/14/2016  . Femoral neck fracture ([place]) 12/01/2015  . Malnutrition of moderate degree 12/01/2015  . Closed fracture of neck of left femur ([place])   . Mood disorder ([place]) 11/19/2015  . Dementia with behavioral disturbance 11/08/2015  . CKD (chronic kidney disease) stage 3, GFR 30-59 ml/min (HCC) 09/17/2015  . Insomnia 08/18/2015  . Confusion 06/20/2015  . Recent urinary tract infection 06/20/2015  . Dysuria 04/10/2015  . Edema 04/06/2015  . Acute encephalopathy 03/29/2015  . Vitamin D deficiency 03/29/2015  . UTI (lower urinary tract infection) 03/21/2015  . Hip fracture ([place]) 03/20/2015  . Chronic atrial fibrillation ([place]) 02/20/2015  . Hyperlipidemia 02/20/2015  . Essential hypertension, benign 12/13/2012  . Cardiac arrhythmia 12/13/2012  . Urinary incontinence 12/13/2012  . Hypothyroidism 12/13/2012  . History of depression 12/11/2012    CMP     Component Value Date/Time   NA 143 12/03/2016   K 4.1 12/03/2016   CL 103 01/10/2016 0030   CO2 23 01/10/2016 0030   GLUCOSE 111 (H)  01/10/2016 0030   BUN 19 12/03/2016   CREATININE 1.0 12/03/2016   CREATININE 1.26 (H) 01/10/2016 0030   CALCIUM 8.6 (L) 01/10/2016 0030   PROT 6.9 12/01/2015 0506   ALBUMIN 3.6 12/01/2015 0506   AST 16 12/03/2016   ALT 9 12/03/2016   ALKPHOS 71 12/03/2016   BILITOT 0.6 12/01/2015 0506   GFRNONAA 36 (L) 01/10/2016 0030   GFRAA 41 (L) 01/10/2016 0030   Recent Labs    12/03/16  NA 143  K 4.1  BUN 19  CREATININE 1.0   Recent Labs    12/03/16  AST 16  ALT 9  ALKPHOS 71   Recent Labs    12/03/16  WBC 5.8  HGB 12.1  HCT 37  PLT 255   Recent Labs    12/03/16  CHOL 117  LDLCALC 46  TRIG 81   No results found for: [facility]  Lab Results  Component Value Date   TSH 0.20 (A) 12/03/2016   Lab Results  Component Value Date   HGBA1C 5.4 02/20/2016   Lab Results  Component Value Date   CHOL 117 12/03/2016   HDL 54 12/03/2016   LDLCALC 46 12/03/2016   TRIG 81 12/03/2016    Significant Diagnostic Results in last 30 days:  No results found.  Assessment and Plan  Hyperlipidemia Patient is no longer on Zocor secondary to age  Mood disorder ([place]) Good control; continue Seroquel 25 mg twice daily  Hypothyroidism Chronic; continue Synthroid 25 mg daily     Graison Leinberger D. Marie Coil, MD

## 2017-07-26 ENCOUNTER — Encounter: Payer: Self-pay | Admitting: Internal Medicine

## 2017-07-26 NOTE — Assessment & Plan Note (Signed)
Good control; continue Seroquel 25 mg twice daily

## 2017-07-26 NOTE — Assessment & Plan Note (Signed)
Chronic; continue Synthroid 25 mg daily

## 2017-07-26 NOTE — Assessment & Plan Note (Signed)
Patient is no longer on Zocor secondary to age

## 2017-07-28 ENCOUNTER — Non-Acute Institutional Stay (SKILLED_NURSING_FACILITY): Payer: Medicare Other

## 2017-07-28 DIAGNOSIS — B351 Tinea unguium: Secondary | ICD-10-CM | POA: Diagnosis not present

## 2017-07-28 DIAGNOSIS — Z0000 Encounter for general adult medical examination without abnormal findings: Secondary | ICD-10-CM | POA: Diagnosis not present

## 2017-07-28 DIAGNOSIS — R262 Difficulty in walking, not elsewhere classified: Secondary | ICD-10-CM | POA: Diagnosis not present

## 2017-07-28 DIAGNOSIS — I739 Peripheral vascular disease, unspecified: Secondary | ICD-10-CM | POA: Diagnosis not present

## 2017-07-28 NOTE — Patient Instructions (Signed)
Marie Robertson , Thank you for taking time to come for your Medicare Wellness Visit. I appreciate your ongoing commitment to your health goals. Please review the following plan we discussed and let me know if I can assist you in the future.   Screening recommendations/referrals: Colonoscopy excluded over age 82 Mammogram excluded, over age 90 Bone Density up to date Recommended yearly ophthalmology/optometry visit for glaucoma screening and checkup Recommended yearly dental visit for hygiene and checkup  Vaccinations: Influenza vaccine up to date, due 2019 fall season Pneumococcal vaccine up to date, completed Tdap vaccine up to date, due 01/09/2026 Shingles vaccine not in past records    Advanced directives: in chart  Conditions/risks identified: none  Next appointment: Dr. [name] makes rounds   Preventive Care 65 Years and Older, Female Preventive care refers to lifestyle choices and visits with your health care provider that can promote health and wellness. What does preventive care include?  A yearly physical exam. This is also called an annual well check.  Dental exams once or twice a year.  Routine eye exams. Ask your health care provider how often you should have your eyes checked.  Personal lifestyle choices, including:  Daily care of your teeth and gums.  Regular physical activity.  Eating a healthy diet.  Avoiding tobacco and drug use.  Limiting alcohol use.  Practicing safe sex.  Taking low-dose aspirin every day.  Taking vitamin and mineral supplements as recommended by your health care provider. What happens during an annual well check? The services and screenings done by your health care provider during your annual well check will depend on your age, overall health, lifestyle risk factors, and family history of disease. Counseling  Your health care provider may ask you questions about your:  Alcohol use.  Tobacco use.  Drug use.  Emotional  well-being.  Home and relationship well-being.  Sexual activity.  Eating habits.  History of falls.  Memory and ability to understand (cognition).  Work and work Statistician.  Reproductive health. Screening  You may have the following tests or measurements:  Height, weight, and BMI.  Blood pressure.  Lipid and cholesterol levels. These may be checked every 5 years, or more frequently if you are over 42 years old.  Skin check.  Lung cancer screening. You may have this screening every year starting at age 67 if you have a 30-pack-year history of smoking and currently smoke or have quit within the past 15 years.  Fecal occult blood test (FOBT) of the stool. You may have this test every year starting at age 61.  Flexible sigmoidoscopy or colonoscopy. You may have a sigmoidoscopy every 5 years or a colonoscopy every 10 years starting at age 59.  Hepatitis C blood test.  Hepatitis B blood test.  Sexually transmitted disease (STD) testing.  Diabetes screening. This is done by checking your blood sugar (glucose) after you have not eaten for a while (fasting). You may have this done every 1-3 years.  Bone density scan. This is done to screen for osteoporosis. You may have this done starting at age 43.  Mammogram. This may be done every 1-2 years. Talk to your health care provider about how often you should have regular mammograms. Talk with your health care provider about your test results, treatment options, and if necessary, the need for more tests. Vaccines  Your health care provider may recommend certain vaccines, such as:  Influenza vaccine. This is recommended every year.  Tetanus, diphtheria, and acellular pertussis (Tdap,  Td) vaccine. You may need a Td booster every 10 years.  Zoster vaccine. You may need this after age 20.  Pneumococcal 13-valent conjugate (PCV13) vaccine. One dose is recommended after age 70.  Pneumococcal polysaccharide (PPSV23) vaccine. One  dose is recommended after age 52. Talk to your health care provider about which screenings and vaccines you need and how often you need them. This information is not intended to replace advice given to you by your health care provider. Make sure you discuss any questions you have with your health care provider. Document Released: 01/20/2015 Document Revised: 09/13/2015 Document Reviewed: 10/25/2014 Elsevier Interactive Patient Education  2017 [employer] Prevention in the Home Falls can cause injuries. They can happen to people of all ages. There are many things you can do to make your home safe and to help prevent falls. What can I do on the outside of my home?  Regularly fix the edges of walkways and driveways and fix any cracks.  Remove anything that might make you trip as you walk through a door, such as a raised step or threshold.  Trim any bushes or trees on the path to your home.  Use bright outdoor lighting.  Clear any walking paths of anything that might make someone trip, such as rocks or tools.  Regularly check to see if handrails are loose or broken. Make sure that both sides of any steps have handrails.  Any raised decks and porches should have guardrails on the edges.  Have any leaves, snow, or ice cleared regularly.  Use sand or salt on walking paths during winter.  Clean up any spills in your garage right away. This includes oil or grease spills. What can I do in the bathroom?  Use night lights.  Install grab bars by the toilet and in the tub and shower. Do not use towel bars as grab bars.  Use non-skid mats or decals in the tub or shower.  If you need to sit down in the shower, use a plastic, non-slip stool.  Keep the floor dry. Clean up any water that spills on the floor as soon as it happens.  Remove soap buildup in the tub or shower regularly.  Attach bath mats securely with double-sided non-slip rug tape.  Do not have throw rugs and other  things on the floor that can make you trip. What can I do in the bedroom?  Use night lights.  Make sure that you have a light by your bed that is easy to reach.  Do not use any sheets or blankets that are too big for your bed. They should not hang down onto the floor.  Have a firm chair that has side arms. You can use this for support while you get dressed.  Do not have throw rugs and other things on the floor that can make you trip. What can I do in the kitchen?  Clean up any spills right away.  Avoid walking on wet floors.  Keep items that you use a lot in easy-to-reach places.  If you need to reach something above you, use a strong step stool that has a grab bar.  Keep electrical cords out of the way.  Do not use floor polish or wax that makes floors slippery. If you must use wax, use non-skid floor wax.  Do not have throw rugs and other things on the floor that can make you trip. What can I do with my stairs?  Do not  leave any items on the stairs.  Make sure that there are handrails on both sides of the stairs and use them. Fix handrails that are broken or loose. Make sure that handrails are as long as the stairways.  Check any carpeting to make sure that it is firmly attached to the stairs. Fix any carpet that is loose or worn.  Avoid having throw rugs at the top or bottom of the stairs. If you do have throw rugs, attach them to the floor with carpet tape.  Make sure that you have a light switch at the top of the stairs and the bottom of the stairs. If you do not have them, ask someone to add them for you. What else can I do to help prevent falls?  Wear shoes that:  Do not have high heels.  Have rubber bottoms.  Are comfortable and fit you well.  Are closed at the toe. Do not wear sandals.  If you use a stepladder:  Make sure that it is fully opened. Do not climb a closed stepladder.  Make sure that both sides of the stepladder are locked into place.  Ask  someone to hold it for you, if possible.  Clearly mark and make sure that you can see:  Any grab bars or handrails.  First and last steps.  Where the edge of each step is.  Use tools that help you move around (mobility aids) if they are needed. These include:  Canes.  Walkers.  Scooters.  Crutches.  Turn on the lights when you go into a dark area. Replace any light bulbs as soon as they burn out.  Set up your furniture so you have a clear path. Avoid moving your furniture around.  If any of your floors are uneven, fix them.  If there are any pets around you, be aware of where they are.  Review your medicines with your doctor. Some medicines can make you feel dizzy. This can increase your chance of falling. Ask your doctor what other things that you can do to help prevent falls. This information is not intended to replace advice given to you by your health care provider. Make sure you discuss any questions you have with your health care provider. Document Released: 10/20/2008 Document Revised: 06/01/2015 Document Reviewed: 01/28/2014 Elsevier Interactive Patient Education  2017 [employer].

## 2017-07-28 NOTE — Progress Notes (Signed)
Subjective:   Marie Robertson is a 84 y.o. female who presents for Medicare Annual (Subsequent) preventive examination at [place]  Last AWV-07/24/2016    Objective:     Vitals: BP 110/75 (BP Location: Left Arm, Patient Position: Sitting)   Pulse 65   Temp (!) 97.4 F (36.3 C) (Oral)   Ht 5\' 4"  (1.626 m)   Wt 109 lb (49.4 kg)   LMP 01/08/1972 (LMP Unknown)   BMI 18.71 kg/m   Body mass index is 18.71 kg/m.  Advanced Directives 07/28/2017 05/01/2017 04/22/2017 02/18/2017 01/22/2017 12/23/2016 11/25/2016  Does Patient Have a Medical Advance Directive? Yes Yes Yes Yes Yes Yes Yes  Type of Advance Directive Out of facility DNR (pink MOST or yellow form) Out of facility DNR (pink MOST or yellow form) Out of facility DNR (pink MOST or yellow form) [place];Out of facility DNR (pink MOST or yellow form) Out of facility DNR (pink MOST or yellow form);Healthcare Power of [place];Out of facility DNR (pink MOST or yellow form) [place];Out of facility DNR (pink MOST or yellow form)  Does patient want to make changes to medical advance directive? No - Patient declined No - Patient declined No - Patient declined - - - -  Copy of Press photographer in Chart? - - - Yes Yes Yes Yes  Would patient like information on creating a medical advance directive? - - - - - - -  Pre-existing out of facility DNR order (yellow form or pink MOST form) Yellow form placed in chart (order not valid for inpatient use) Yellow form placed in chart (order not valid for inpatient use) - Yellow form placed in chart (order not valid for inpatient use) Yellow form placed in chart (order not valid for inpatient use) Yellow form placed in chart (order not valid for inpatient use) Yellow form placed in chart (order not valid for inpatient use)    Tobacco Social History   Tobacco Use  Smoking Status Never Smoker  Smokeless Tobacco  Never Used     Counseling given: Not Answered   Clinical Intake:  Pre-visit preparation completed: No  Pain : No/denies pain     Nutritional Risks: None Diabetes: No  How often do you need to have someone help you when you read instructions, pamphlets, or other written materials from your doctor or pharmacy?: 2 - Rarely What is the last grade level you completed in school?: College  Interpreter Needed?: No  Information entered by :: Tyson Dense, RN  Past Medical History:  Diagnosis Date  . Acute encephalopathy 03/29/2015  . Cardiac arrhythmia 12/13/2012   Per pt, takes atenolol, pradaxa. asymptomatic.   Marland Kitchen Chronic atrial fibrillation ([place])   . Chronic renal insufficiency, stage III (moderate) (HCC)   . CKD (chronic kidney disease) stage 3, GFR 30-59 ml/min (HCC) 09/17/2015  . Closed fracture of neck of left femur ([place])   . Confusion 06/20/2015  . Constipation   . CVA (cerebral infarction) approx 2012   residual defect: poor R sided peripheral vision per son  . Dementia with behavioral disturbance 11/08/2015  . Depression   . Femoral neck fracture ([place]) 12/01/2015  . Hip fracture ([place]) 03/20/2015  . History of depression 12/11/2012   Pt reports 40 pound weight loss. No current Tx.   Marland Kitchen History of fracture of right hip 03/2015  . Hyperlipidemia   . Hypertension   . Hypothyroidism   . Insomnia   .  Osteoarthritis    wrists, hands  . Osteoporosis   . Urinary incontinence 12/13/2012  . Vitamin D deficiency 03/29/2015   Past Surgical History:  Procedure Laterality Date  . APPENDECTOMY    . COLONOSCOPY  08/22/08  . INTRAMEDULLARY (IM) NAIL INTERTROCHANTERIC Right 03/22/2015   Procedure: RIGHT femoral HIP NAILING;  Surgeon: Gaynelle Arabian, MD;  Location: [facility];  Service: Orthopedics;  Laterality: Right;  . KNEE ARTHROSCOPY Right   . THYROID SURGERY     Family History  Problem Relation Age of Onset  . Stroke Mother   . Diabetes Mother   . Alcohol abuse Father    Social  History   Socioeconomic History  . Marital status: Widowed    Spouse name: Not on file  . Number of children: Not on file  . Years of education: Not on file  . Highest education level: Not on file  Occupational History  . Occupation: retired Corporate treasurer  Social Needs  . Financial resource strain: Not hard at all  . Food insecurity:    Worry: Never true    Inability: Never true  . Transportation needs:    Medical: No    Non-medical: No  Tobacco Use  . Smoking status: Never Smoker  . Smokeless tobacco: Never Used  Substance and Sexual Activity  . Alcohol use: No  . Drug use: No  . Sexual activity: Never  Lifestyle  . Physical activity:    Days per week: 0 days    Minutes per session: 0 min  . Stress: Not at all  Relationships  . Social connections:    Talks on phone: Once a week    Gets together: Once a week    Attends religious service: Never    Active member of club or organization: No    Attends meetings of clubs or organizations: Never    Relationship status: Widowed  Other Topics Concern  . Not on file  Social History Narrative   Widow.   Educ: 11th grade.   Occupation: retired Insurance claims handler and retired Secretary/administrator.   As of 02/2015, pt is in independent living at [facility] in [place].   No T/A/Ds.   Admitted to [facility] 08[facility]14/15   Never smoked   Alcohol none   DNR    Outpatient Encounter Medications as of 07/28/2017  Medication Sig  . acetaminophen (TYLENOL) 500 MG tablet Take 2 tablets (1,000 mg total) by mouth every 6 (six) hours as needed for mild pain, moderate pain or headache.  Marland Kitchen apixaban (ELIQUIS) 2.5 MG TABS tablet Take 2.5 mg by mouth 2 (two) times daily. 9am and 5pm  . atenolol (TENORMIN) 50 MG tablet Take 50 mg by mouth daily.  . feeding supplement (BOOST / RESOURCE BREEZE) LIQD Take 1 Container by mouth 3 (three) times daily between meals.   Marland Kitchen levothyroxine (SYNTHROID, LEVOTHROID) 25 MCG tablet Take 25 mcg by mouth daily before  breakfast.  . ondansetron (ZOFRAN-ODT) 4 MG disintegrating tablet Take 4 mg by mouth every 8 (eight) hours as needed for nausea or vomiting.  Marland Kitchen QUEtiapine (SEROQUEL) 25 MG tablet Take 25 mg by mouth 2 (two) times daily.   Marland Kitchen senna (SENOKOT) 8.6 MG tablet Take 1 tablet by mouth at bedtime as needed for constipation.   Marland Kitchen tiZANidine (ZANAFLEX) 2 MG tablet Take 2 mg by mouth every 6 (six) hours.   No facility-administered encounter medications on file as of 07/28/2017.     Activities of Daily Living In  your present state of health, do you have any difficulty performing the following activities: 07/28/2017  Hearing? Y  Vision? N  Difficulty concentrating or making decisions? Y  Walking or climbing stairs? Y  Dressing or bathing? Y  Doing errands, shopping? Y  Preparing Food and eating ? Y  Using the Toilet? Y  In the past six months, have you accidently leaked urine? Y  Do you have problems with loss of bowel control? Y  Managing your Medications? Y  Managing your Finances? Y  Housekeeping or managing your Housekeeping? Y  Some recent data might be hidden    Patient Care Team: [name], MD as PCP - General (Internal Medicine)    Assessment:   This is a routine wellness examination for Anjenette.  Exercise Activities and Dietary recommendations Current Exercise Habits: The patient does not participate in regular exercise at present, Exercise limited by: orthopedic condition(s);neurologic condition(s)  Goals    None      Fall Risk Fall Risk  07/28/2017 07/24/2016  Falls in the past year? No Yes  Number falls in past yr: - 2 or more  Injury with Fall? - Yes   Is the patient's home free of loose throw rugs in walkways, pet beds, electrical cords, etc?   yes      Grab bars in the bathroom? yes      Handrails on the stairs?   yes      Adequate lighting?   yes   Depression Screen PHQ 2/9 Scores 07/28/2017 07/24/2016  PHQ - 2 Score 0 1     Cognitive Function     6CIT  Screen 07/28/2017 07/24/2016  What Year? 4 points 4 points  What month? 3 points 3 points  What time? 3 points 3 points  Count back from 20 4 points 4 points  Months in reverse 4 points 4 points  Repeat phrase 10 points 10 points  Total Score 28 28    Immunization History  Administered Date(s) Administered  . Influenza Split 11/20/2012  . Influenza-Unspecified 10/07/2015, 10/18/2016  . PPD Test 08/14/2015  . Pneumococcal Conjugate-13 05/22/2015  . Pneumococcal Polysaccharide-23 08/22/2008  . Tdap 01/01/2015, 08/06/2015, 01/10/2016    Qualifies for Shingles Vaccine? Not in past records  Screening Tests Health Maintenance  Topic Date Due  . INFLUENZA VACCINE  08/07/2017  . TETANUS/TDAP  01/09/2026  . DEXA SCAN  Completed  . PNA vac Low Risk Adult  Completed    Cancer Screenings: Lung: Low Dose CT Chest recommended if Age 34-80 years, 30 pack-year currently smoking OR have quit w/in 15years. Patient does not qualify. Breast:  Up to date on Mammogram? Yes   Up to date of Bone Density/Dexa? Yes Colorectal: up to date  Additional Screenings:  Hepatitis C Screening: declined     Plan:    I have personally reviewed and addressed the Medicare Annual Wellness questionnaire and have noted the following in the patient's chart:  A. Medical and social history B. Use of alcohol, tobacco or illicit drugs  C. Current medications and supplements D. Functional ability and status E.  Nutritional status F.  Physical activity G. Advance directives H. List of other physicians I.  Hospitalizations, surgeries, and ER visits in previous 12 months J.  [place] to include hearing, vision, cognitive, depression L. Referrals and appointments - none  In addition, I have reviewed and discussed with patient certain preventive protocols, quality metrics, and best practice recommendations. A written personalized care plan for  preventive services as well as general preventive health  recommendations were provided to patient.  See attached scanned questionnaire for additional information.   Signed,   Tyson Dense, RN Nurse Health Advisor  Patient Concern: None

## 2017-07-29 IMAGING — CT CT HEAD W/O CM
4 series · 16 of 47 positions shown, 18 images · non-contrast
Comparison: None.

CLINICAL DATA: Status post fall, with concern for head injury.
Initial encounter.

EXAM:
CT HEAD WITHOUT CONTRAST
TECHNIQUE: Contiguous axial images were obtained from the base of the skull
through the vertex without intravenous contrast.

[Series 2: head without · axial · non-contrast · 0.41mm/px · z∈[+143,+253]mm · 6 of 32 slices shown, 8 images]
[im 5/32  brain]
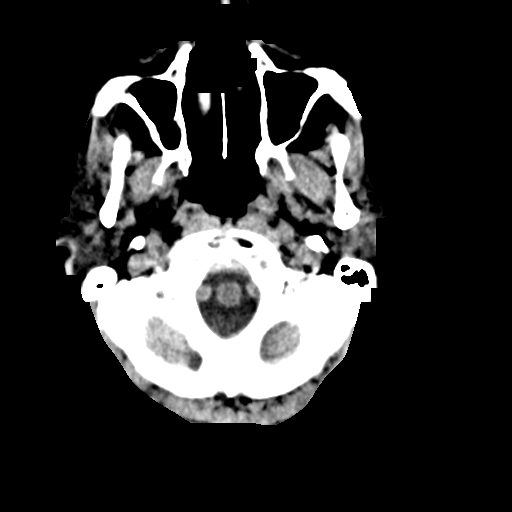
[im 5/32  bone]
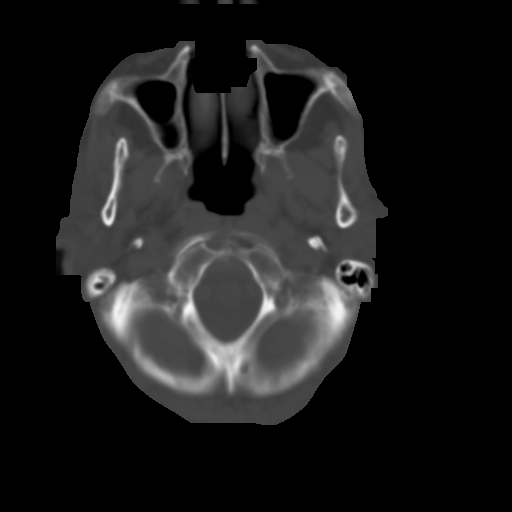
[im 9/32  brain]
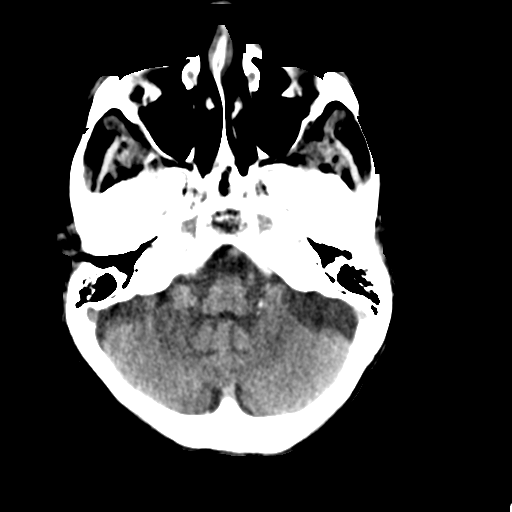
[im 14/32  brain]
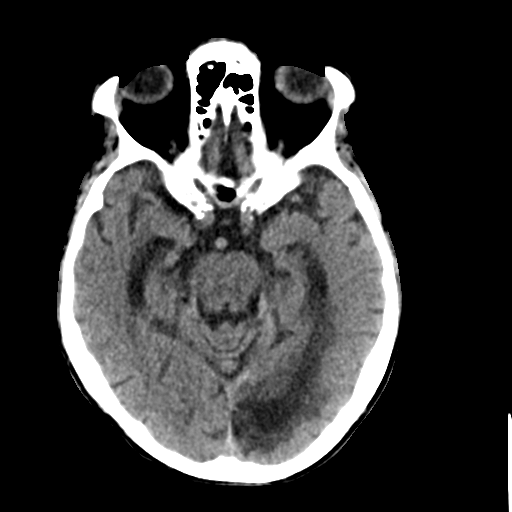
[im 18/32  brain]
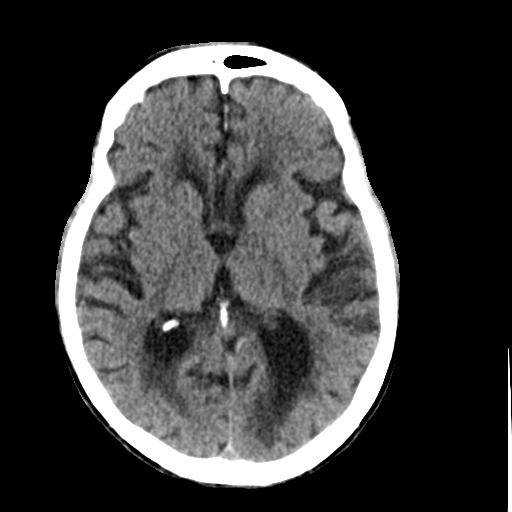
[im 23/32  brain]
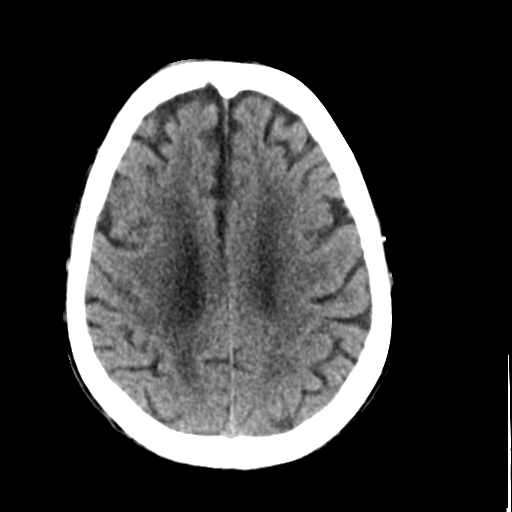
[im 23/32  bone]
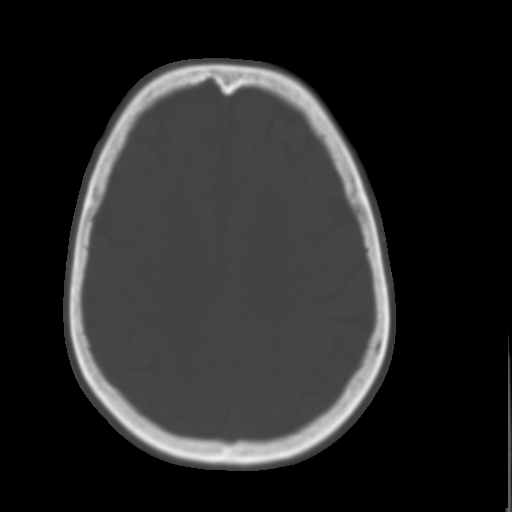
[im 27/32  brain]
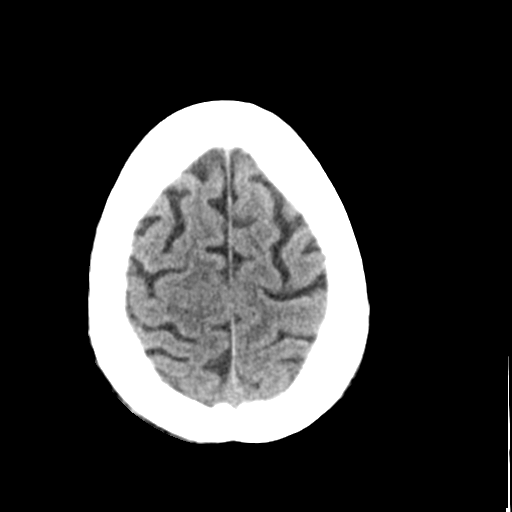

[Series 3: head bone · axial · 0.41mm/px · z∈[+139,+195]mm · 4 of 86 slices shown]
[im 9/86  bone]
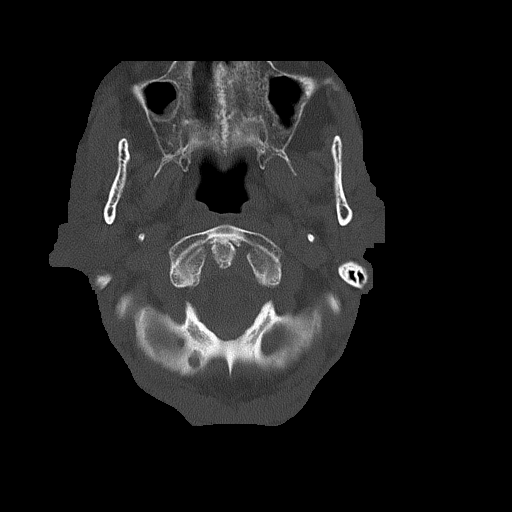
[im 17/86  bone]
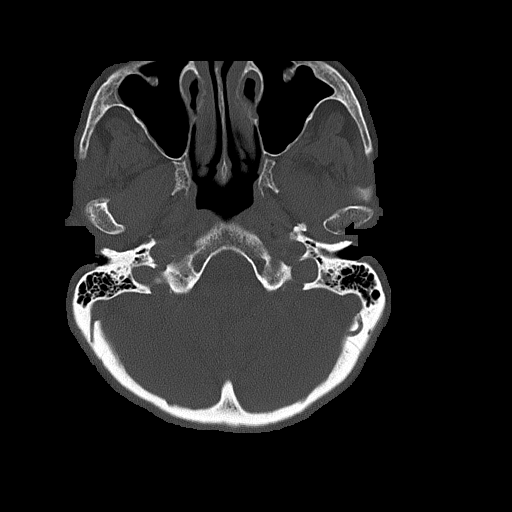
[im 29/86  bone]
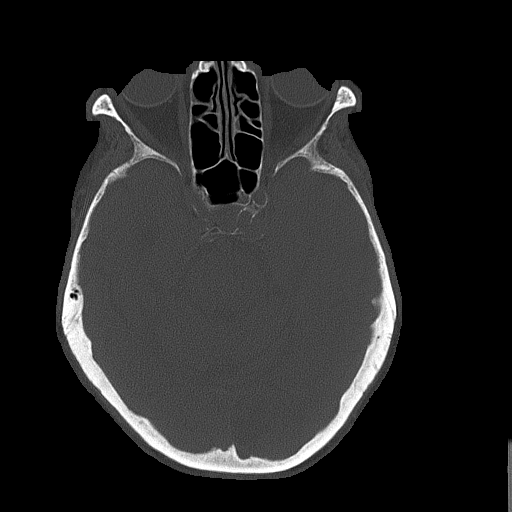
[im 37/86  bone]
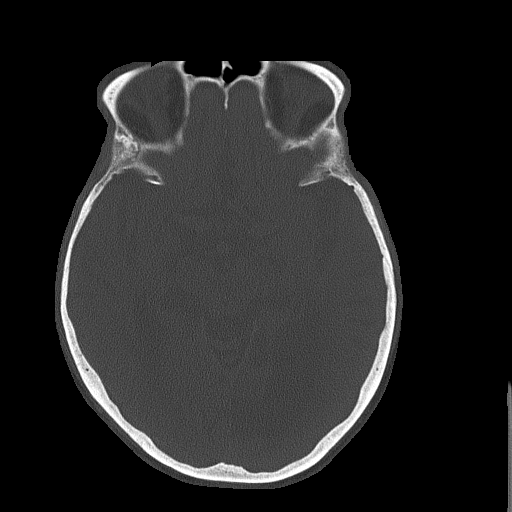

[Series 4: head without cor · coronal · non-contrast · 0.34mm/px · 3 of 67 slices shown]
[im 23/67  brain]
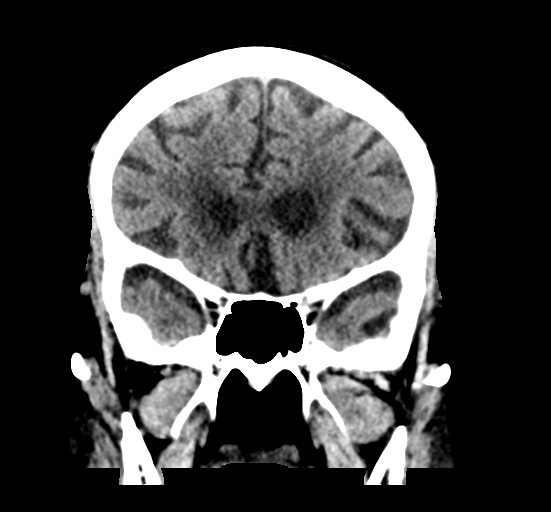
[im 30/67  brain]
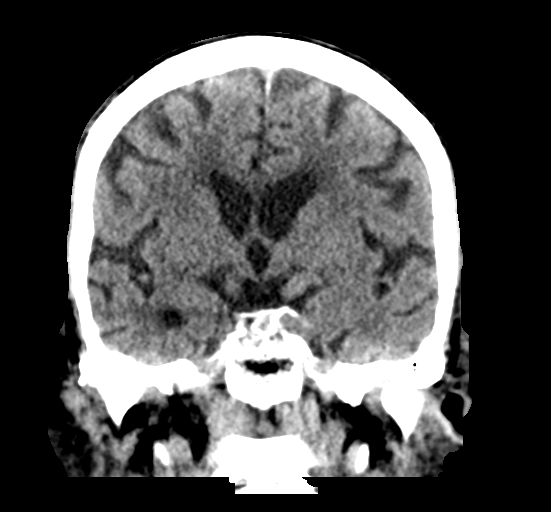
[im 37/67  brain]
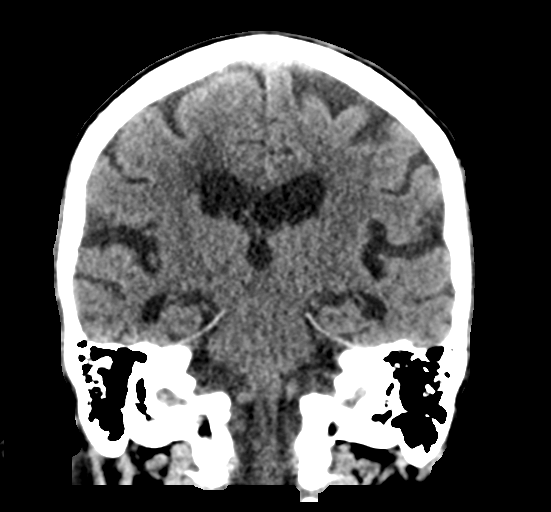

[Series 5: head without sag · sagittal · non-contrast · 0.33mm/px · 3 of 59 slices shown]
[im 20/59  brain]
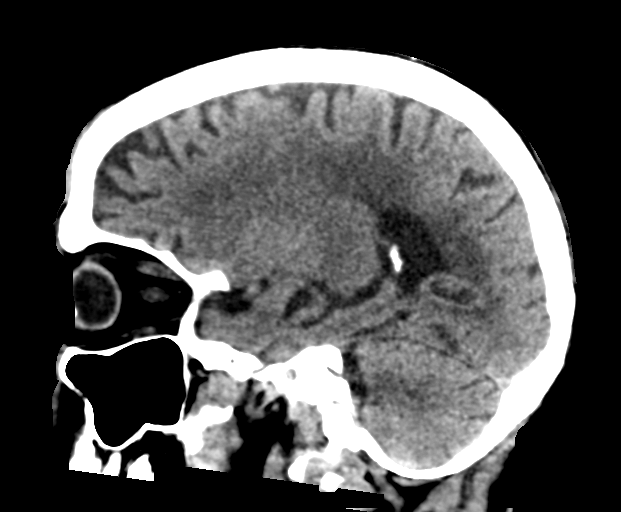
[im 30/59  brain]
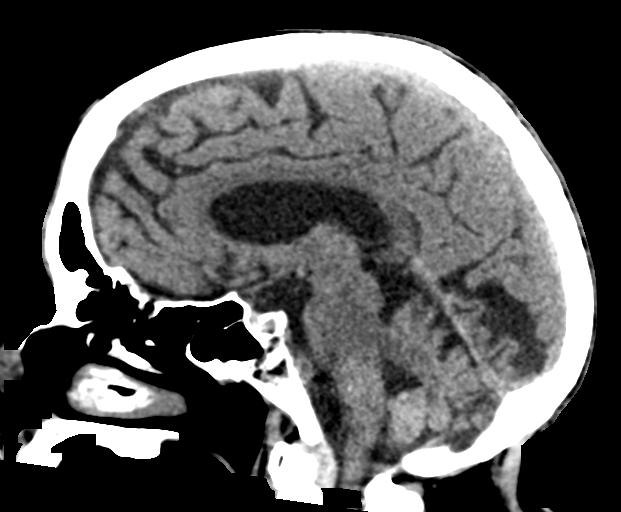
[im 39/59  brain]
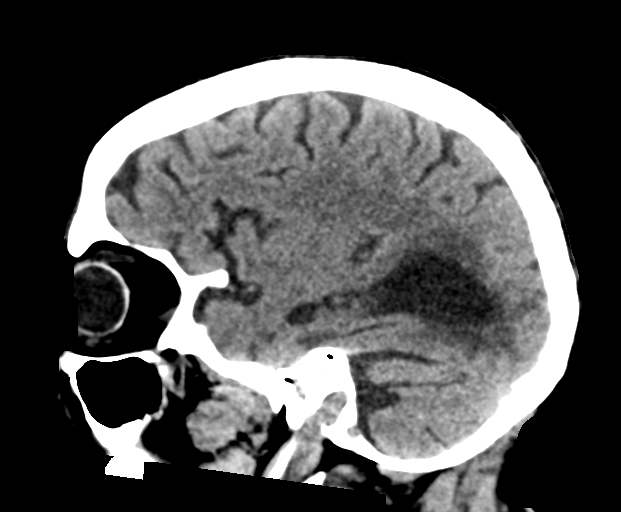

[16 of 47 positions shown; findings below may reference images not displayed]

FINDINGS: Brain: No evidence of acute infarction, hemorrhage, hydrocephalus,
extra-axial collection or mass lesion/mass effect.

Prominence of the ventricles and sulci reflects mild to moderate
cortical volume loss, with scattered periventricular and subcortical
white matter change likely reflecting small vessel ischemic
microangiopathy. A chronic infarct is noted at the left occipital
lobe, with associated encephalomalacia. Cerebellar atrophy is noted.

The brainstem and fourth ventricle are within normal limits. The
basal ganglia are unremarkable in appearance. No mass effect or
midline shift is seen.

Vascular: No hyperdense vessel or unexpected calcification.

Skull: There is no evidence of fracture; visualized osseous
structures are unremarkable in appearance.

Sinuses/Orbits: The orbits are within normal limits. The paranasal
sinuses and mastoid air cells are well-aerated.

Other: Soft tissue swelling is noted overlying the right frontal
calvarium.
IMPRESSION: 1. No evidence of traumatic intracranial injury or fracture.
2. Soft tissue swelling overlying the right frontal calvarium.
3. Mild to moderate cortical volume loss and scattered small vessel
ischemic microangiopathy.
4. Chronic infarct at the left occipital lobe, with associated
encephalomalacia.

## 2017-08-20 ENCOUNTER — Non-Acute Institutional Stay (SKILLED_NURSING_FACILITY): Payer: Medicare Other | Admitting: Internal Medicine

## 2017-08-20 ENCOUNTER — Encounter: Payer: Self-pay | Admitting: Internal Medicine

## 2017-08-20 DIAGNOSIS — I482 Chronic atrial fibrillation, unspecified: Secondary | ICD-10-CM

## 2017-08-20 DIAGNOSIS — F0281 Dementia in other diseases classified elsewhere with behavioral disturbance: Secondary | ICD-10-CM

## 2017-08-20 DIAGNOSIS — G301 Alzheimer's disease with late onset: Secondary | ICD-10-CM | POA: Diagnosis not present

## 2017-08-20 DIAGNOSIS — F02818 Dementia in other diseases classified elsewhere, unspecified severity, with other behavioral disturbance: Secondary | ICD-10-CM

## 2017-08-20 DIAGNOSIS — N183 Chronic kidney disease, stage 3 unspecified: Secondary | ICD-10-CM

## 2017-08-20 NOTE — Progress Notes (Signed)
Location:  [facility] Room Number: [phone] Place of Service:  SNF [phone])  Noah Delaine. [name], MD  Patient Care Team: [name], MD as PCP - General (Internal Medicine)  Extended Emergency Contact Information Primary Emergency Contact: [facility] Address: [address]          [place], [place] [address] Johnnette Litter of [place] Phone: [phone] Relation: Son Secondary Emergency Contact: [place], [place] [address] Johnnette Litter of [place] Phone: [phone] Relation: Son    Allergies: Patient has no known allergies.  Chief Complaint  Patient presents with  . Medical Management of Chronic Issues    Routine Visit    HPI: Patient is 84 y.o. female who is being seen for routine issues of dementia, chronic kidney disease stage III, and chronic atrial fib.  Past Medical History:  Diagnosis Date  . Acute encephalopathy 03/29/2015  . Cardiac arrhythmia 12/13/2012   Per pt, takes atenolol, pradaxa. asymptomatic.   Marland Kitchen Chronic atrial fibrillation ([place])   . Chronic renal insufficiency, stage III (moderate) (HCC)   . CKD (chronic kidney disease) stage 3, GFR 30-59 ml/min (HCC) 09/17/2015  . Closed fracture of neck of left femur ([place])   . Confusion 06/20/2015  . Constipation   . CVA (cerebral infarction) approx 2012   residual defect: poor R sided peripheral vision per son  . Dementia with behavioral disturbance 11/08/2015  . Depression   . Femoral neck fracture ([place]) 12/01/2015  . Hip fracture ([place]) 03/20/2015  . History of depression 12/11/2012   Pt reports 40 pound weight loss. No current Tx.   Marland Kitchen History of fracture of right hip 03/2015  . Hyperlipidemia   . Hypertension   . Hypothyroidism   . Insomnia   . Osteoarthritis    wrists, hands  . Osteoporosis   . Urinary incontinence 12/13/2012  . Vitamin D deficiency 03/29/2015    Past Surgical History:  Procedure Laterality Date  . APPENDECTOMY    . COLONOSCOPY   08/22/08  . INTRAMEDULLARY (IM) NAIL INTERTROCHANTERIC Right 03/22/2015   Procedure: RIGHT femoral HIP NAILING;  Surgeon: Gaynelle Arabian, MD;  Location: [facility]S;  Service: Orthopedics;  Laterality: Right;  . KNEE ARTHROSCOPY Right   . THYROID SURGERY      Allergies as of 08/20/2017   No Known Allergies     Medication List        Accurate as of 08/20/17 11:59 PM. Always use your most recent med list.          acetaminophen 500 MG tablet Commonly known as:  TYLENOL Take 2 tablets (1,000 mg total) by mouth every 6 (six) hours as needed for mild pain, moderate pain or headache.   atenolol 50 MG tablet Commonly known as:  TENORMIN Take 50 mg by mouth daily.   ELIQUIS 2.5 MG Tabs tablet Generic drug:  apixaban Take 2.5 mg by mouth 2 (two) times daily. 9am and 5pm   feeding supplement Liqd Take 1 Container by mouth 3 (three) times daily between meals.   levothyroxine 25 MCG tablet Commonly known as:  SYNTHROID, LEVOTHROID Take 25 mcg by mouth daily before breakfast.   ondansetron 4 MG disintegrating tablet Commonly known as:  ZOFRAN-ODT Take 4 mg by mouth every 8 (eight) hours as needed for nausea or vomiting.   QUEtiapine 25 MG tablet Commonly known as:  SEROQUEL Take 25 mg by mouth 2 (two) times daily.  senna 8.6 MG tablet Commonly known as:  SENOKOT Take 1 tablet by mouth at bedtime as needed for constipation.   tiZANidine 2 MG tablet Commonly known as:  ZANAFLEX Take 2 mg by mouth every 6 (six) hours.       No orders of the defined types were placed in this encounter.   Immunization History  Administered Date(s) Administered  . Influenza Split 11/20/2012  . Influenza-Unspecified 10/07/2015, 10/18/2016  . PPD Test 08/14/2015  . Pneumococcal Conjugate-13 05/22/2015  . Pneumococcal Polysaccharide-23 08/22/2008  . Tdap 01/01/2015, 08/06/2015, 01/10/2016    Social History   Tobacco Use  . Smoking status: Never Smoker  . Smokeless tobacco: Never Used    Substance Use Topics  . Alcohol use: No    Review of Systems to obtain secondary to dementia; nursing-no acute concerns      Vitals:   08/20/17 1450  BP: 120/80  Pulse: 68  Resp: 18  Temp: 97.7 F (36.5 C)   Body mass index is 18.5 kg/m. Physical Exam  GENERAL APPEARANCE: Alert, conversant, No acute distress  SKIN: No diaphoresis rash HEENT: Unremarkable RESPIRATORY: Breathing is even, unlabored. Lung sounds are clear   CARDIOVASCULAR: Heart irregular no murmurs, rubs or gallops. No peripheral edema  GASTROINTESTINAL: Abdomen is soft, non-tender, not distended w/ normal bowel sounds.  GENITOURINARY: Bladder non tender, not distended  MUSCULOSKELETAL: No abnormal joints or musculature NEUROLOGIC: Cranial nerves 2-12 grossly intact. Moves all extremities PSYCHIATRIC: Dementia, no behavioral issues  Patient Active Problem List   Diagnosis Date Noted  . Failure to thrive in adult 07/14/2016  . Femoral neck fracture ([place]) 12/01/2015  . Malnutrition of moderate degree 12/01/2015  . Closed fracture of neck of left femur ([place])   . Mood disorder ([place]) 11/19/2015  . Dementia with behavioral disturbance 11/08/2015  . CKD (chronic kidney disease) stage 3, GFR 30-59 ml/min (HCC) 09/17/2015  . Insomnia 08/18/2015  . Confusion 06/20/2015  . Recent urinary tract infection 06/20/2015  . Dysuria 04/10/2015  . Edema 04/06/2015  . Acute encephalopathy 03/29/2015  . Vitamin D deficiency 03/29/2015  . UTI (lower urinary tract infection) 03/21/2015  . Hip fracture ([place]) 03/20/2015  . Chronic atrial fibrillation ([place]) 02/20/2015  . Hyperlipidemia 02/20/2015  . Essential hypertension, benign 12/13/2012  . Cardiac arrhythmia 12/13/2012  . Urinary incontinence 12/13/2012  . Hypothyroidism 12/13/2012  . History of depression 12/11/2012    CMP     Component Value Date/Time   NA 143 12/03/2016   K 4.1 12/03/2016   CL 103 01/10/2016 0030   CO2 23 01/10/2016 0030   GLUCOSE 111 (H)  01/10/2016 0030   BUN 19 12/03/2016   CREATININE 1.0 12/03/2016   CREATININE 1.26 (H) 01/10/2016 0030   CALCIUM 8.6 (L) 01/10/2016 0030   PROT 6.9 12/01/2015 0506   ALBUMIN 3.6 12/01/2015 0506   AST 16 12/03/2016   ALT 9 12/03/2016   ALKPHOS 71 12/03/2016   BILITOT 0.6 12/01/2015 0506   GFRNONAA 36 (L) 01/10/2016 0030   GFRAA 41 (L) 01/10/2016 0030   Recent Labs    12/03/16  NA 143  K 4.1  BUN 19  CREATININE 1.0   Recent Labs    12/03/16  AST 16  ALT 9  ALKPHOS 71   Recent Labs    12/03/16  WBC 5.8  HGB 12.1  HCT 37  PLT 255   Recent Labs    12/03/16  CHOL 117  LDLCALC 46  TRIG 81   No results found for:  [facility] Lab Results  Component Value Date   TSH 0.20 (A) 12/03/2016   Lab Results  Component Value Date   HGBA1C 5.4 02/20/2016   Lab Results  Component Value Date   CHOL 117 12/03/2016   HDL 54 12/03/2016   LDLCALC 46 12/03/2016   TRIG 81 12/03/2016    Significant Diagnostic Results in last 30 days:  No results found.  Assessment and Plan  Dementia with behavioral disturbance Chronic, no major declines; patient on no medications; continue supportive care  CKD (chronic kidney disease) stage 3, GFR 30-59 ml/min No recent GFR, creatinine is 1 which is stable from prior and excellent for age; monitor at intervals  Chronic atrial fibrillation (HCC) Stable; continue Tenormin 50 mg daily for rate control and Eliquis 2.5 mg twice daily as prophylaxis    Webb Silversmith D. [name], MD

## 2017-08-27 ENCOUNTER — Encounter: Payer: Self-pay | Admitting: Internal Medicine

## 2017-08-27 NOTE — Assessment & Plan Note (Signed)
No recent GFR, creatinine is 1 which is stable from prior and excellent for age; monitor at intervals

## 2017-08-27 NOTE — Assessment & Plan Note (Signed)
Stable; continue Tenormin 50 mg daily for rate control and Eliquis 2.5 mg twice daily as prophylaxis

## 2017-08-27 NOTE — Assessment & Plan Note (Signed)
Chronic, no major declines; patient on no medications; continue supportive care

## 2017-09-10 DIAGNOSIS — F0390 Unspecified dementia without behavioral disturbance: Secondary | ICD-10-CM | POA: Diagnosis not present

## 2017-09-10 DIAGNOSIS — F419 Anxiety disorder, unspecified: Secondary | ICD-10-CM | POA: Diagnosis not present

## 2017-09-10 DIAGNOSIS — G4700 Insomnia, unspecified: Secondary | ICD-10-CM | POA: Diagnosis not present

## 2017-09-10 DIAGNOSIS — R443 Hallucinations, unspecified: Secondary | ICD-10-CM | POA: Diagnosis not present

## 2017-09-16 ENCOUNTER — Non-Acute Institutional Stay (SKILLED_NURSING_FACILITY): Payer: Medicare Other | Admitting: Internal Medicine

## 2017-09-16 ENCOUNTER — Encounter: Payer: Self-pay | Admitting: Internal Medicine

## 2017-09-16 DIAGNOSIS — E034 Atrophy of thyroid (acquired): Secondary | ICD-10-CM | POA: Diagnosis not present

## 2017-09-16 DIAGNOSIS — I10 Essential (primary) hypertension: Secondary | ICD-10-CM | POA: Diagnosis not present

## 2017-09-16 DIAGNOSIS — F39 Unspecified mood [affective] disorder: Secondary | ICD-10-CM

## 2017-09-16 NOTE — Progress Notes (Signed)
Location:  [place] Room Number: 311-W Place of Service:  SNF (31)  [name], MD  Patient Care Team: [name], MD as PCP - General (Internal Medicine)  Extended Emergency Contact Information Primary Emergency Contact: [facility] Address: [address]          [place], [place] [address] [place] of [place] Phone: [phone] Relation: Son Secondary Emergency Contact: [place], [place] [address] [place] of [place] Phone: [phone] Relation: Son    Allergies: Patient has no known allergies.  Chief Complaint  Patient presents with  . Medical Management of Chronic Issues    HPI: Patient is 84 y.o. female who   Past Medical History:  Diagnosis Date  . Acute encephalopathy 03/29/2015  . Cardiac arrhythmia 12/13/2012   Per pt, takes atenolol, pradaxa. asymptomatic.   Marland Kitchen Chronic atrial fibrillation ([place])   . Chronic renal insufficiency, stage III (moderate) (HCC)   . CKD (chronic kidney disease) stage 3, GFR 30-59 ml/min (HCC) 09/17/2015  . Closed fracture of neck of left femur ([place])   . Confusion 06/20/2015  . Constipation   . CVA (cerebral infarction) approx 2012   residual defect: poor R sided peripheral vision per son  . Dementia with behavioral disturbance 11/08/2015  . Depression   . Femoral neck fracture ([place]) 12/01/2015  . Hip fracture ([place]) 03/20/2015  . History of depression 12/11/2012   Pt reports 40 pound weight loss. No current Tx.   Marland Kitchen History of fracture of right hip 03/2015  . Hyperlipidemia   . Hypertension   . Hypothyroidism   . Insomnia   . Osteoarthritis    wrists, hands  . Osteoporosis   . Urinary incontinence 12/13/2012  . Vitamin D deficiency 03/29/2015    Past Surgical History:  Procedure Laterality Date  . APPENDECTOMY    . COLONOSCOPY  08/22/08  . INTRAMEDULLARY (IM) NAIL INTERTROCHANTERIC Right 03/22/2015   Procedure: RIGHT femoral HIP NAILING;  Surgeon:  Gaynelle Arabian, MD;  Location: [facility];  Service: Orthopedics;  Laterality: Right;  . KNEE ARTHROSCOPY Right   . THYROID SURGERY      Allergies as of 09/16/2017   No Known Allergies     Medication List        Accurate as of 09/16/17 11:59 PM. Always use your most recent med list.          acetaminophen 325 MG tablet Commonly known as:  TYLENOL Take 650 mg by mouth 3 (three) times daily.   atenolol 50 MG tablet Commonly known as:  TENORMIN Take 50 mg by mouth daily.   ELIQUIS 2.5 MG Tabs tablet Generic drug:  apixaban Take 2.5 mg by mouth 2 (two) times daily. 9am and 5pm   feeding supplement Liqd Take 1 Container by mouth 3 (three) times daily between meals.   levothyroxine 25 MCG tablet Commonly known as:  SYNTHROID, LEVOTHROID Take 25 mcg by mouth daily before breakfast.   ondansetron 4 MG disintegrating tablet Commonly known as:  ZOFRAN-ODT Take 4 mg by mouth every 8 (eight) hours as needed for nausea or vomiting.   QUEtiapine 25 MG tablet Commonly known as:  SEROQUEL Take 25 mg by mouth 2 (two) times daily.   senna 8.6 MG tablet Commonly known as:  SENOKOT Take 1 tablet by mouth at bedtime as needed for constipation.   tiZANidine 2 MG tablet Commonly known as:  ZANAFLEX Take 2  mg by mouth every 8 (eight) hours as needed for muscle spasms.       No orders of the defined types were placed in this encounter.   Immunization History  Administered Date(s) Administered  . Influenza Split 11/20/2012  . Influenza-Unspecified 10/07/2015, 10/18/2016  . PPD Test 08/14/2015  . Pneumococcal Conjugate-13 05/22/2015  . Pneumococcal Polysaccharide-23 08/22/2008  . Tdap 01/01/2015, 08/06/2015, 01/10/2016    Social History   Tobacco Use  . Smoking status: Never Smoker  . Smokeless tobacco: Never Used  Substance Use Topics  . Alcohol use: No    Review of Systems  DATA OBTAINED: from patient, nurse, medical record, family member GENERAL:  no fevers, fatigue,  appetite changes SKIN: No itching, rash HEENT: No complaint RESPIRATORY: No cough, wheezing, SOB CARDIAC: No chest pain, palpitations, lower extremity edema  GI: No abdominal pain, No N/V/D or constipation, No heartburn or reflux  GU: No dysuria, frequency or urgency, or incontinence  MUSCULOSKELETAL: No unrelieved bone/joint pain NEUROLOGIC: No headache, dizziness  PSYCHIATRIC: No overt anxiety or sadness  Vitals:   09/16/17 1044  BP: 136/89  Pulse: 62  Resp: 18  Temp: (!) 97.4 F (36.3 C)  SpO2: 96%   Body mass index is 18.5 kg/m. Physical Exam  GENERAL APPEARANCE: Alert, conversant, No acute distress  SKIN: No diaphoresis rash HEENT: Unremarkable RESPIRATORY: Breathing is even, unlabored. Lung sounds are clear   CARDIOVASCULAR: Heart RRR no murmurs, rubs or gallops. No peripheral edema  GASTROINTESTINAL: Abdomen is soft, non-tender, not distended w/ normal bowel sounds.  GENITOURINARY: Bladder non tender, not distended  MUSCULOSKELETAL: No abnormal joints or musculature NEUROLOGIC: Cranial nerves 2-12 grossly intact. Moves all extremities PSYCHIATRIC: Mood and affect appropriate to situation, no behavioral issues  Patient Active Problem List   Diagnosis Date Noted  . Failure to thrive in adult 07/14/2016  . Femoral neck fracture ([place]) 12/01/2015  . Malnutrition of moderate degree 12/01/2015  . Closed fracture of neck of left femur ([place])   . Mood disorder ([place]) 11/19/2015  . Dementia with behavioral disturbance 11/08/2015  . CKD (chronic kidney disease) stage 3, GFR 30-59 ml/min (HCC) 09/17/2015  . Insomnia 08/18/2015  . Confusion 06/20/2015  . Recent urinary tract infection 06/20/2015  . Dysuria 04/10/2015  . Edema 04/06/2015  . Acute encephalopathy 03/29/2015  . Vitamin D deficiency 03/29/2015  . UTI (lower urinary tract infection) 03/21/2015  . Hip fracture ([place]) 03/20/2015  . Chronic atrial fibrillation ([place]) 02/20/2015  . Hyperlipidemia 02/20/2015  .  Essential hypertension, benign 12/13/2012  . Cardiac arrhythmia 12/13/2012  . Urinary incontinence 12/13/2012  . Hypothyroidism 12/13/2012  . History of depression 12/11/2012    CMP     Component Value Date/Time   NA 143 12/03/2016   K 4.1 12/03/2016   CL 103 01/10/2016 0030   CO2 23 01/10/2016 0030   GLUCOSE 111 (H) 01/10/2016 0030   BUN 19 12/03/2016   CREATININE 1.0 12/03/2016   CREATININE 1.26 (H) 01/10/2016 0030   CALCIUM 8.6 (L) 01/10/2016 0030   PROT 6.9 12/01/2015 0506   ALBUMIN 3.6 12/01/2015 0506   AST 16 12/03/2016   ALT 9 12/03/2016   ALKPHOS 71 12/03/2016   BILITOT 0.6 12/01/2015 0506   GFRNONAA 36 (L) 01/10/2016 0030   GFRAA 41 (L) 01/10/2016 0030   Recent Labs    12/03/16  NA 143  K 4.1  BUN 19  CREATININE 1.0   Recent Labs    12/03/16  AST 16  ALT 9  ALKPHOS  71   Recent Labs    12/03/16  WBC 5.8  HGB 12.1  HCT 37  PLT 255   Recent Labs    12/03/16  CHOL 117  LDLCALC 46  TRIG 81   No results found for: [facility] Lab Results  Component Value Date   TSH 0.20 (A) 12/03/2016   Lab Results  Component Value Date   HGBA1C 5.4 02/20/2016   Lab Results  Component Value Date   CHOL 117 12/03/2016   HDL 54 12/03/2016   LDLCALC 46 12/03/2016   TRIG 81 12/03/2016    Significant Diagnostic Results in last 30 days:  No results found.  Assessment and Pl  Location:  [place] [facility] [place] Room Number: 311-W Place of Service:  SNF (31)  [name], MD  Patient Care Team: [name], MD as PCP - General (Internal Medicine)  Extended Emergency Contact Information Primary Emergency Contact: [facility] Address: [address]          [place], [place] [address] Johnnette Litter of [place] Phone: [phone] Relation: Son Secondary Emergency Contact: [place], [place] [address] Johnnette Litter of [place] Phone: [phone] Relation: Son    Allergies: Patient has no known  allergies.  Chief Complaint  Patient presents with  . Medical Management of Chronic Issues    HPI: Patient is 84 y.o. female who   Past Medical History:  Diagnosis Date  . Acute encephalopathy 03/29/2015  . Cardiac arrhythmia 12/13/2012   Per pt, takes atenolol, pradaxa. asymptomatic.   Marland Kitchen Chronic atrial fibrillation ([place])   . Chronic renal insufficiency, stage III (moderate) (HCC)   . CKD (chronic kidney disease) stage 3, GFR 30-59 ml/min (HCC) 09/17/2015  . Closed fracture of neck of left femur ([place])   . Confusion 06/20/2015  . Constipation   . CVA (cerebral infarction) approx 2012   residual defect: poor R sided peripheral vision per son  . Dementia with behavioral disturbance 11/08/2015  . Depression   . Femoral neck fracture ([place]) 12/01/2015  . Hip fracture ([place]) 03/20/2015  . History of depression 12/11/2012   Pt reports 40 pound weight loss. No current Tx.   Marland Kitchen History of fracture of right hip 03/2015  . Hyperlipidemia   . Hypertension   . Hypothyroidism   . Insomnia   . Osteoarthritis    wrists, hands  . Osteoporosis   . Urinary incontinence 12/13/2012  . Vitamin D deficiency 03/29/2015    Past Surgical History:  Procedure Laterality Date  . APPENDECTOMY    . COLONOSCOPY  08/22/08  . INTRAMEDULLARY (IM) NAIL INTERTROCHANTERIC Right 03/22/2015   Procedure: RIGHT femoral HIP NAILING;  Surgeon: Gaynelle Arabian, MD;  [facility];  Service: Orthopedics;  Laterality: Right;  . KNEE ARTHROSCOPY Right   . THYROID SURGERY      Allergies as of 09/16/2017   No Known Allergies     Medication List        Accurate as of 09/16/17 11:59 PM. Always use your most recent med list.          acetaminophen 325 MG tablet Commonly known as:  TYLENOL Take 650 mg by mouth 3 (three) times daily.   atenolol 50 MG tablet Commonly known as:  TENORMIN Take 50 mg by mouth daily.   ELIQUIS 2.5 MG Tabs tablet Generic drug:  apixaban Take 2.5 mg by mouth 2 (two) times daily. 9am and  5pm  feeding supplement Liqd Take 1 Container by mouth 3 (three) times daily between meals.   levothyroxine 25 MCG tablet Commonly known as:  SYNTHROID, LEVOTHROID Take 25 mcg by mouth daily before breakfast.   ondansetron 4 MG disintegrating tablet Commonly known as:  ZOFRAN-ODT Take 4 mg by mouth every 8 (eight) hours as needed for nausea or vomiting.   QUEtiapine 25 MG tablet Commonly known as:  SEROQUEL Take 25 mg by mouth 2 (two) times daily.   senna 8.6 MG tablet Commonly known as:  SENOKOT Take 1 tablet by mouth at bedtime as needed for constipation.   tiZANidine 2 MG tablet Commonly known as:  ZANAFLEX Take 2 mg by mouth every 8 (eight) hours as needed for muscle spasms.       No orders of the defined types were placed in this encounter.   Immunization History  Administered Date(s) Administered  . Influenza Split 11/20/2012  . Influenza-Unspecified 10/07/2015, 10/18/2016  . PPD Test 08/14/2015  . Pneumococcal Conjugate-13 05/22/2015  . Pneumococcal Polysaccharide-23 08/22/2008  . Tdap 01/01/2015, 08/06/2015, 01/10/2016    Social History   Tobacco Use  . Smoking status: Never Smoker  . Smokeless tobacco: Never Used  Substance Use Topics  . Alcohol use: No    Review of Systems  DATA OBTAINED: from patient, nurse, medical record, family member GENERAL:  no fevers, fatigue, appetite changes SKIN: No itching, rash HEENT: No complaint RESPIRATORY: No cough, wheezing, SOB CARDIAC: No chest pain, palpitations, lower extremity edema  GI: No abdominal pain, No N/V/D or constipation, No heartburn or reflux  GU: No dysuria, frequency or urgency, or incontinence  MUSCULOSKELETAL: No unrelieved bone/joint pain NEUROLOGIC: No headache, dizziness  PSYCHIATRIC: No overt anxiety or sadness  Vitals:   09/16/17 1044  BP: 136/89  Pulse: 62  Resp: 18  Temp: (!) 97.4 F (36.3 C)  SpO2: 96%   Body mass index is 18.5 kg/m. Physical Exam  GENERAL  APPEARANCE: Alert, conversant, No acute distress  SKIN: No diaphoresis rash HEENT: Unremarkable RESPIRATORY: Breathing is even, unlabored. Lung sounds are clear   CARDIOVASCULAR: Heart RRR no murmurs, rubs or gallops. No peripheral edema  GASTROINTESTINAL: Abdomen is soft, non-tender, not distended w/ normal bowel sounds.  GENITOURINARY: Bladder non tender, not distended  MUSCULOSKELETAL: No abnormal joints or musculature NEUROLOGIC: Cranial nerves 2-12 grossly intact. Moves all extremities PSYCHIATRIC: Mood and affect appropriate to situation, no behavioral issues  Patient Active Problem List   Diagnosis Date Noted  . Failure to thrive in adult 07/14/2016  . Femoral neck fracture ([place]) 12/01/2015  . Malnutrition of moderate degree 12/01/2015  . Closed fracture of neck of left femur ([place])   . Mood disorder ([place]) 11/19/2015  . Dementia with behavioral disturbance 11/08/2015  . CKD (chronic kidney disease) stage 3, GFR 30-59 ml/min (HCC) 09/17/2015  . Insomnia 08/18/2015  . Confusion 06/20/2015  . Recent urinary tract infection 06/20/2015  . Dysuria 04/10/2015  . Edema 04/06/2015  . Acute encephalopathy 03/29/2015  . Vitamin D deficiency 03/29/2015  . UTI (lower urinary tract infection) 03/21/2015  . Hip fracture ([place]) 03/20/2015  . Chronic atrial fibrillation ([place]) 02/20/2015  . Hyperlipidemia 02/20/2015  . Essential hypertension, benign 12/13/2012  . Cardiac arrhythmia 12/13/2012  . Urinary incontinence 12/13/2012  . Hypothyroidism 12/13/2012  . History of depression 12/11/2012    CMP     Component Value Date/Time   NA 143 12/03/2016   K 4.1 12/03/2016   CL 103 01/10/2016 0030   CO2 23 01/10/2016  0030   GLUCOSE 111 (H) 01/10/2016 0030   BUN 19 12/03/2016   CREATININE 1.0 12/03/2016   CREATININE 1.26 (H) 01/10/2016 0030   CALCIUM 8.6 (L) 01/10/2016 0030   PROT 6.9 12/01/2015 0506   ALBUMIN 3.6 12/01/2015 0506   AST 16 12/03/2016   ALT 9 12/03/2016   ALKPHOS 71  12/03/2016   BILITOT 0.6 12/01/2015 0506   GFRNONAA 36 (L) 01/10/2016 0030   GFRAA 41 (L) 01/10/2016 0030   Recent Labs    12/03/16  NA 143  K 4.1  BUN 19  CREATININE 1.0   Recent Labs    12/03/16  AST 16  ALT 9  ALKPHOS 71   Recent Labs    12/03/16  WBC 5.8  HGB 12.1  HCT 37  PLT 255   Recent Labs    12/03/16  CHOL 117  LDLCALC 46  TRIG 81   No results found for: [facility] Lab Results  Component Value Date   TSH 0.20 (A) 12/03/2016   Lab Results  Component Value Date   HGBA1C 5.4 02/20/2016   Lab Results  Component Value Date   CHOL 117 12/03/2016   HDL 54 12/03/2016   LDLCALC 46 12/03/2016   TRIG 81 12/03/2016    Significant Diagnostic Results in last 30 days:  No results found.  Assessment and Plan  Essential hypertension, benign No problems; continue Tenormin 50 mg daily  Mood disorder (HCC) Controlled; continue Seroquel 25 mg twice daily  Hypothyroidism Plan to take patient off of Synthroid and recheck TSH in 4 weeks   Labs/tests ordered:    Inocencio Homes, MD   Location:  [place] Room Number: 311-W Place of Service:  SNF (31)  [name], MD  Patient Care Team: [name], MD as PCP - General (Internal Medicine)  Extended Emergency Contact Information Primary Emergency Contact: [facility] Address: [address]          [place], [place] [address] [place] Phone: [phone] Relation: Son Secondary Emergency Contact: [place], [place] [address] [place] of [place] Phone: [phone] Relation: Son    Allergies: Patient has no known allergies.  Chief Complaint  Patient presents with  . Medical Management of Chronic Issues    HPI: Patient is 84 y.o. female who is being seen for routine issues of hypertension, mood disorder, and hypothyroidism.  Past Medical History:  Diagnosis Date  . Acute encephalopathy 03/29/2015  .  Cardiac arrhythmia 12/13/2012   Per pt, takes atenolol, pradaxa. asymptomatic.   Marland Kitchen Chronic atrial fibrillation ([place])   . Chronic renal insufficiency, stage III (moderate) (HCC)   . CKD (chronic kidney disease) stage 3, GFR 30-59 ml/min (HCC) 09/17/2015  . Closed fracture of neck of left femur ([place])   . Confusion 06/20/2015  . Constipation   . CVA (cerebral infarction) approx 2012   residual defect: poor R sided peripheral vision per son  . Dementia with behavioral disturbance 11/08/2015  . Depression   . Femoral neck fracture ([place]) 12/01/2015  . Hip fracture ([place]) 03/20/2015  . History of depression 12/11/2012   Pt reports 40 pound weight loss. No current Tx.   Marland Kitchen History of fracture of right hip 03/2015  . Hyperlipidemia   . Hypertension   . Hypothyroidism   . Insomnia   . Osteoarthritis    wrists, hands  . Osteoporosis   . Urinary incontinence  12/13/2012  . Vitamin D deficiency 03/29/2015    Past Surgical History:  Procedure Laterality Date  . APPENDECTOMY    . COLONOSCOPY  08/22/08  . INTRAMEDULLARY (IM) NAIL INTERTROCHANTERIC Right 03/22/2015   Procedure: RIGHT femoral HIP NAILING;  Surgeon: Gaynelle Arabian, MD;  [facility];  Service: Orthopedics;  Laterality: Right;  . KNEE ARTHROSCOPY Right   . THYROID SURGERY      Allergies as of 09/16/2017   No Known Allergies     Medication List        Accurate as of 09/16/17 11:59 PM. Always use your most recent med list.          acetaminophen 325 MG tablet Commonly known as:  TYLENOL Take 650 mg by mouth 3 (three) times daily.   atenolol 50 MG tablet Commonly known as:  TENORMIN Take 50 mg by mouth daily.   ELIQUIS 2.5 MG Tabs tablet Generic drug:  apixaban Take 2.5 mg by mouth 2 (two) times daily. 9am and 5pm   feeding supplement Liqd Take 1 Container by mouth 3 (three) times daily between meals.   levothyroxine 25 MCG tablet Commonly known as:  SYNTHROID, LEVOTHROID Take 25 mcg by mouth daily before breakfast.     ondansetron 4 MG disintegrating tablet Commonly known as:  ZOFRAN-ODT Take 4 mg by mouth every 8 (eight) hours as needed for nausea or vomiting.   QUEtiapine 25 MG tablet Commonly known as:  SEROQUEL Take 25 mg by mouth 2 (two) times daily.   senna 8.6 MG tablet Commonly known as:  SENOKOT Take 1 tablet by mouth at bedtime as needed for constipation.   tiZANidine 2 MG tablet Commonly known as:  ZANAFLEX Take 2 mg by mouth every 8 (eight) hours as needed for muscle spasms.       No orders of the defined types were placed in this encounter.   Immunization History  Administered Date(s) Administered  . Influenza Split 11/20/2012  . Influenza-Unspecified 10/07/2015, 10/18/2016  . PPD Test 08/14/2015  . Pneumococcal Conjugate-13 05/22/2015  . Pneumococcal Polysaccharide-23 08/22/2008  . Tdap 01/01/2015, 08/06/2015, 01/10/2016    Social History   Tobacco Use  . Smoking status: Never Smoker  . Smokeless tobacco: Never Used  Substance Use Topics  . Alcohol use: No    Review of Systems unable to obtain secondary to dementia; nursing-no acute concerns    Vitals:   09/16/17 1044  BP: 136/89  Pulse: 62  Resp: 18  Temp: (!) 97.4 F (36.3 C)  SpO2: 96%   Body mass index is 18.5 kg/m. Physical Exam  GENERAL APPEARANCE: Alert, conversant, No acute distress  SKIN: No diaphoresis rash HEENT: Unremarkable RESPIRATORY: Breathing is even, unlabored. Lung sounds are clear   CARDIOVASCULAR: Heart irregular no murmurs, rubs or gallops. No peripheral edema  GASTROINTESTINAL: Abdomen is soft, non-tender, not distended w/ normal bowel sounds.  GENITOURINARY: Bladder non tender, not distended  MUSCULOSKELETAL: No abnormal joints or musculature NEUROLOGIC: Cranial nerves 2-12 grossly intact. Moves all extremities PSYCHIATRIC: Dementia, no behavioral issues  Patient Active Problem List   Diagnosis Date Noted  . Failure to thrive in adult 07/14/2016  . Femoral neck fracture  ([place]) 12/01/2015  . Malnutrition of moderate degree 12/01/2015  . Closed fracture of neck of left femur ([place])   . Mood disorder ([place]) 11/19/2015  . Dementia with behavioral disturbance 11/08/2015  . CKD (chronic kidney disease) stage 3, GFR 30-59 ml/min (HCC) 09/17/2015  . Insomnia 08/18/2015  . Confusion 06/20/2015  .  Recent urinary tract infection 06/20/2015  . Dysuria 04/10/2015  . Edema 04/06/2015  . Acute encephalopathy 03/29/2015  . Vitamin D deficiency 03/29/2015  . UTI (lower urinary tract infection) 03/21/2015  . Hip fracture ([place]) 03/20/2015  . Chronic atrial fibrillation ([place]) 02/20/2015  . Hyperlipidemia 02/20/2015  . Essential hypertension, benign 12/13/2012  . Cardiac arrhythmia 12/13/2012  . Urinary incontinence 12/13/2012  . Hypothyroidism 12/13/2012  . History of depression 12/11/2012    CMP     Component Value Date/Time   NA 143 12/03/2016   K 4.1 12/03/2016   CL 103 01/10/2016 0030   CO2 23 01/10/2016 0030   GLUCOSE 111 (H) 01/10/2016 0030   BUN 19 12/03/2016   CREATININE 1.0 12/03/2016   CREATININE 1.26 (H) 01/10/2016 0030   CALCIUM 8.6 (L) 01/10/2016 0030   PROT 6.9 12/01/2015 0506   ALBUMIN 3.6 12/01/2015 0506   AST 16 12/03/2016   ALT 9 12/03/2016   ALKPHOS 71 12/03/2016   BILITOT 0.6 12/01/2015 0506   GFRNONAA 36 (L) 01/10/2016 0030   GFRAA 41 (L) 01/10/2016 0030   Recent Labs    12/03/16  NA 143  K 4.1  BUN 19  CREATININE 1.0   Recent Labs    12/03/16  AST 16  ALT 9  ALKPHOS 71   Recent Labs    12/03/16  WBC 5.8  HGB 12.1  HCT 37  PLT 255   Recent Labs    12/03/16  CHOL 117  LDLCALC 46  TRIG 81   No results found for: [facility] Lab Results  Component Value Date   TSH 0.20 (A) 12/03/2016   Lab Results  Component Value Date   HGBA1C 5.4 02/20/2016   Lab Results  Component Value Date   CHOL 117 12/03/2016   HDL 54 12/03/2016   LDLCALC 46 12/03/2016   TRIG 81 12/03/2016    Significant Diagnostic Results in  last 30 days:  No results found.  Assessment and Plan  Essential hypertension, benign No problems; continue Tenormin 50 mg daily  Mood disorder (HCC) Controlled; continue Seroquel 25 mg twice daily  Hypothyroidism Plan to take patient off of Synthroid and recheck TSH in 4 weeks    Inocencio Homes, MD    Labs/tests ordered:    Noah Delaine. [name], MD

## 2017-09-21 ENCOUNTER — Encounter: Payer: Self-pay | Admitting: Internal Medicine

## 2017-09-21 NOTE — Assessment & Plan Note (Signed)
Controlled; continue Seroquel 25 mg twice daily 

## 2017-09-21 NOTE — Assessment & Plan Note (Signed)
Plan to take patient off of Synthroid and recheck TSH in 4 weeks

## 2017-09-21 NOTE — Assessment & Plan Note (Signed)
No problems; continue Tenormin 50 mg daily

## 2017-09-30 DIAGNOSIS — F419 Anxiety disorder, unspecified: Secondary | ICD-10-CM | POA: Diagnosis not present

## 2017-09-30 DIAGNOSIS — G4700 Insomnia, unspecified: Secondary | ICD-10-CM | POA: Diagnosis not present

## 2017-09-30 DIAGNOSIS — F0390 Unspecified dementia without behavioral disturbance: Secondary | ICD-10-CM | POA: Diagnosis not present

## 2017-10-13 ENCOUNTER — Encounter: Payer: Self-pay | Admitting: Internal Medicine

## 2017-10-13 ENCOUNTER — Non-Acute Institutional Stay (SKILLED_NURSING_FACILITY): Payer: Medicare Other | Admitting: Internal Medicine

## 2017-10-13 DIAGNOSIS — E119 Type 2 diabetes mellitus without complications: Secondary | ICD-10-CM | POA: Diagnosis not present

## 2017-10-13 DIAGNOSIS — E785 Hyperlipidemia, unspecified: Secondary | ICD-10-CM | POA: Diagnosis not present

## 2017-10-13 DIAGNOSIS — I10 Essential (primary) hypertension: Secondary | ICD-10-CM | POA: Diagnosis not present

## 2017-10-13 DIAGNOSIS — I482 Chronic atrial fibrillation, unspecified: Secondary | ICD-10-CM

## 2017-10-13 DIAGNOSIS — E559 Vitamin D deficiency, unspecified: Secondary | ICD-10-CM | POA: Diagnosis not present

## 2017-10-13 DIAGNOSIS — E039 Hypothyroidism, unspecified: Secondary | ICD-10-CM | POA: Diagnosis not present

## 2017-10-13 DIAGNOSIS — Z7901 Long term (current) use of anticoagulants: Secondary | ICD-10-CM | POA: Diagnosis not present

## 2017-10-13 DIAGNOSIS — E0811 Diabetes mellitus due to underlying condition with ketoacidosis with coma: Secondary | ICD-10-CM | POA: Diagnosis not present

## 2017-10-13 LAB — HEPATIC FUNCTION PANEL
ALT: 9 (ref 7–35)
AST: 16 (ref 13–35)
Alkaline Phosphatase: 68 (ref 25–125)
BILIRUBIN, TOTAL: 0.3

## 2017-10-13 LAB — BASIC METABOLIC PANEL
BUN: 37 — AB (ref 4–21)
Creatinine: 1.3 — AB (ref 0.5–1.1)
GLUCOSE: 85
Potassium: 4 (ref 3.4–5.3)
Sodium: 144 (ref 137–147)

## 2017-10-13 LAB — LIPID PANEL
Cholesterol: 131 (ref 0–200)
HDL: 49 (ref 35–70)
LDL CALC: 67
Triglycerides: 73 (ref 40–160)

## 2017-10-13 LAB — CBC AND DIFFERENTIAL
HCT: 33 — AB (ref 36–46)
Hemoglobin: 10.9 — AB (ref 12.0–16.0)
Platelets: 218 (ref 150–399)
WBC: 5.2

## 2017-10-13 LAB — VITAMIN D 25 HYDROXY (VIT D DEFICIENCY, FRACTURES): Vit D, 25-Hydroxy: 20.3

## 2017-10-13 LAB — HEMOGLOBIN A1C: Hemoglobin A1C: 5.6

## 2017-10-13 LAB — TSH: TSH: 0.47 (ref 0.41–5.90)

## 2017-10-13 NOTE — Progress Notes (Signed)
Location:  [facility] Room Number: [phone] Place of Service:  SNF [phone])  Marie Robertson. Marie Coil, MD  Patient Care Team: Marie Duos, MD as PCP - General (Internal Medicine)  Extended Emergency Contact Information Primary Emergency Contact: Marie Robertson Address: [address]          [place], [place] [address] Marie Robertson of Marie Robertson Phone: [phone] Relation: Son Secondary Emergency Contact: Marie Robertson, Marie Robertson [address] Marie Robertson of [place] Phone: [phone] Relation: Son    Allergies: Patient has no known allergies.  Chief Complaint  Patient presents with  . Medical Management of Chronic Issues    Routine Visit  . Health Maintenance    Influenza vacc.    HPI: Patient is 84 y.o. female who is being seen for routine issues of hypertension, chronic atrial fib, and hyperlipidemia.  Past Medical History:  Diagnosis Date  . Acute encephalopathy 03/29/2015  . Cardiac arrhythmia 12/13/2012   Per pt, takes atenolol, pradaxa. asymptomatic.   Marland Kitchen Chronic atrial fibrillation   . Chronic renal insufficiency, stage III (moderate) (HCC)   . CKD (chronic kidney disease) stage 3, GFR 30-59 ml/min (HCC) 09/17/2015  . Closed fracture of neck of left femur ([place])   . Confusion 06/20/2015  . Constipation   . CVA (cerebral infarction) approx 2012   residual defect: poor R sided peripheral vision per son  . Dementia with behavioral disturbance (Marie Robertson) 11/08/2015  . Depression   . Femoral neck fracture ([place]) 12/01/2015  . Hip fracture ([place]) 03/20/2015  . History of depression 12/11/2012   Pt reports 40 pound weight loss. No current Tx.   Marland Kitchen History of fracture of right hip 03/2015  . Hyperlipidemia   . Hypertension   . Hypothyroidism   . Insomnia   . Osteoarthritis    wrists, hands  . Osteoporosis   . Urinary incontinence 12/13/2012  . Vitamin D deficiency 03/29/2015    Past Surgical History:  Procedure Laterality Date  .  APPENDECTOMY    . COLONOSCOPY  08/22/08  . INTRAMEDULLARY (IM) NAIL INTERTROCHANTERIC Right 03/22/2015   Procedure: RIGHT femoral HIP NAILING;  Surgeon: Marie Arabian, MD;  Location: [facility];  Service: Orthopedics;  Laterality: Right;  . KNEE ARTHROSCOPY Right   . THYROID SURGERY      Allergies as of 10/13/2017   No Known Allergies     Medication List        Accurate as of 10/13/17 11:59 PM. Always use your most recent med list.          acetaminophen 325 MG tablet Commonly known as:  TYLENOL Take 650 mg by mouth 3 (three) times daily.   atenolol 50 MG tablet Commonly known as:  TENORMIN Take 50 mg by mouth daily.   ELIQUIS 2.5 MG Tabs tablet Generic drug:  apixaban Take 2.5 mg by mouth 2 (two) times daily. 9am and 5pm   feeding supplement Liqd Take 1 Container by mouth 3 (three) times daily between meals.   levothyroxine 25 MCG tablet Commonly known as:  SYNTHROID, LEVOTHROID Take 25 mcg by mouth daily before breakfast.   ondansetron 4 MG disintegrating tablet Commonly known as:  ZOFRAN-ODT Take 4 mg by mouth every 8 (eight) hours as needed for nausea or vomiting.   QUEtiapine 25 MG tablet Commonly known as:  SEROQUEL Take 25 mg by mouth 2 (two) times daily.   senna 8.6 MG tablet Commonly known  asDonavan Robertson Take 1 tablet by mouth at bedtime as needed for constipation.   tiZANidine 2 MG tablet Commonly known as:  ZANAFLEX Take 2 mg by mouth every 8 (eight) hours as needed for muscle spasms.       No orders of the defined types were placed in this encounter.   Immunization History  Administered Date(s) Administered  . Influenza Split 11/20/2012  . Influenza-Unspecified 10/07/2015, 10/18/2016  . PPD Test 08/14/2015  . Pneumococcal Conjugate-13 05/22/2015  . Pneumococcal Polysaccharide-23 08/22/2008  . Tdap 01/01/2015, 08/06/2015, 01/10/2016    Social History   Tobacco Use  . Smoking status: Never Smoker  . Smokeless tobacco: Never Used  Substance  Use Topics  . Alcohol use: No    Review of Systems unable to obtain secondary to dementia; nursing-no acute concerns    Vitals:   10/13/17 1219  BP: (!) 81/60  Pulse: 64  Resp: 18  Temp: 97.6 F (36.4 C)   Body mass index is 18.37 kg/m. Physical Exam  GENERAL APPEARANCE: Alert, conversant, No acute distress  SKIN: No diaphoresis rash HEENT: Unremarkable RESPIRATORY: Breathing is even, unlabored. Lung sounds are clear   CARDIOVASCULAR: Heart regular no murmurs, rubs or gallops. No peripheral edema  GASTROINTESTINAL: Abdomen is soft, non-tender, not distended w/ normal bowel sounds.  GENITOURINARY: Bladder non tender, not distended  MUSCULOSKELETAL: No abnormal joints or musculature NEUROLOGIC: Cranial nerves 2-12 grossly intact. Moves all extremities PSYCHIATRIC: Dementia, no behavioral issues  Patient Active Problem List   Diagnosis Date Noted  . Failure to thrive in adult 07/14/2016  . Femoral neck fracture ([place]) 12/01/2015  . Malnutrition of moderate degree 12/01/2015  . Closed fracture of neck of left femur ([place])   . Mood disorder ([place]) 11/19/2015  . Dementia with behavioral disturbance ([place]) 11/08/2015  . CKD (chronic kidney disease) stage 3, GFR 30-59 ml/min (HCC) 09/17/2015  . Insomnia 08/18/2015  . Confusion 06/20/2015  . Recent urinary tract infection 06/20/2015  . Dysuria 04/10/2015  . Edema 04/06/2015  . Acute encephalopathy 03/29/2015  . Vitamin D deficiency 03/29/2015  . UTI (lower urinary tract infection) 03/21/2015  . Hip fracture ([place]) 03/20/2015  . Chronic atrial fibrillation 02/20/2015  . Hyperlipidemia 02/20/2015  . Essential hypertension, benign 12/13/2012  . Cardiac arrhythmia 12/13/2012  . Urinary incontinence 12/13/2012  . Hypothyroidism 12/13/2012  . History of depression 12/11/2012    CMP     Component Value Date/Time   NA 144 10/13/2017   K 4.0 10/13/2017   CL 103 01/10/2016 0030   CO2 23 01/10/2016 0030   GLUCOSE 111 (H)  01/10/2016 0030   BUN 37 (A) 10/13/2017   CREATININE 1.3 (A) 10/13/2017   CREATININE 1.26 (H) 01/10/2016 0030   CALCIUM 8.6 (L) 01/10/2016 0030   PROT 6.9 12/01/2015 0506   ALBUMIN 3.6 12/01/2015 0506   AST 16 10/13/2017   ALT 9 10/13/2017   ALKPHOS 68 10/13/2017   BILITOT 0.6 12/01/2015 0506   GFRNONAA 36 (L) 01/10/2016 0030   GFRAA 41 (L) 01/10/2016 0030   Recent Labs    12/03/16 10/13/17  NA 143 144  K 4.1 4.0  BUN 19 37*  CREATININE 1.0 1.3*   Recent Labs    12/03/16 10/13/17  AST 16 16  ALT 9 9  ALKPHOS 71 68   Recent Labs    12/03/16 10/13/17  WBC 5.8 5.2  HGB 12.1 10.9*  HCT 37 33*  PLT 255 218   Recent Labs    12/03/16 10/13/17  CHOL 117 131  LDLCALC 46 67  TRIG 81 73   No results found for: [facility] Lab Results  Component Value Date   TSH 0.47 10/13/2017   Lab Results  Component Value Date   HGBA1C 5.6 10/13/2017   Lab Results  Component Value Date   CHOL 131 10/13/2017   HDL 49 10/13/2017   LDLCALC 67 10/13/2017   TRIG 73 10/13/2017    Significant Diagnostic Results in last 30 days:  No results found.  Assessment and Plan  Essential hypertension, benign Controlled; continue Tenormin 50 mg daily  Chronic atrial fibrillation (HCC) Chronic and stable; rate control with Tenormin 50 mg daily and prophylaxis with Eliquis 2.5 mg twice daily  Hyperlipidemia LDL 67 HDL 49 on no medication; good control    Sharica Roedel D. Marie Coil, MD

## 2017-10-31 DIAGNOSIS — Z23 Encounter for immunization: Secondary | ICD-10-CM | POA: Diagnosis not present

## 2017-11-06 ENCOUNTER — Encounter: Payer: Self-pay | Admitting: Internal Medicine

## 2017-11-06 DIAGNOSIS — G4700 Insomnia, unspecified: Secondary | ICD-10-CM | POA: Diagnosis not present

## 2017-11-06 DIAGNOSIS — F0390 Unspecified dementia without behavioral disturbance: Secondary | ICD-10-CM | POA: Diagnosis not present

## 2017-11-06 DIAGNOSIS — F419 Anxiety disorder, unspecified: Secondary | ICD-10-CM | POA: Diagnosis not present

## 2017-11-06 DIAGNOSIS — R443 Hallucinations, unspecified: Secondary | ICD-10-CM | POA: Diagnosis not present

## 2017-11-06 NOTE — Assessment & Plan Note (Signed)
LDL 67 HDL 49 on no medication; good control

## 2017-11-06 NOTE — Assessment & Plan Note (Signed)
Controlled; continue Tenormin 50 mg daily 

## 2017-11-06 NOTE — Assessment & Plan Note (Signed)
Chronic and stable; rate control with Tenormin 50 mg daily and prophylaxis with Eliquis 2.5 mg twice daily

## 2017-11-14 ENCOUNTER — Non-Acute Institutional Stay (SKILLED_NURSING_FACILITY): Payer: Medicare Other | Admitting: Internal Medicine

## 2017-11-14 ENCOUNTER — Encounter: Payer: Self-pay | Admitting: Internal Medicine

## 2017-11-14 DIAGNOSIS — G301 Alzheimer's disease with late onset: Secondary | ICD-10-CM

## 2017-11-14 DIAGNOSIS — F0281 Dementia in other diseases classified elsewhere with behavioral disturbance: Secondary | ICD-10-CM | POA: Diagnosis not present

## 2017-11-14 DIAGNOSIS — E559 Vitamin D deficiency, unspecified: Secondary | ICD-10-CM | POA: Diagnosis not present

## 2017-11-14 DIAGNOSIS — N183 Chronic kidney disease, stage 3 unspecified: Secondary | ICD-10-CM

## 2017-11-14 DIAGNOSIS — F02818 Dementia in other diseases classified elsewhere, unspecified severity, with other behavioral disturbance: Secondary | ICD-10-CM

## 2017-11-14 NOTE — Progress Notes (Signed)
Location:  [facility] Room Number: [phone] Place of Service:  SNF [phone])  Marie Robertson. Marie Coil, MD  Patient Care Team: [name], MD as PCP - General (Internal Medicine)  Extended Emergency Contact Information Primary Emergency Contact: [facility] Address: [address]          [place], [place] [address] Johnnette Litter of [place] Phone: [phone] Relation: Son Secondary Emergency Contact: [place], [place] [address] Johnnette Litter of [place] Phone: [phone] Relation: Son    Allergies: Patient has no known allergies.  Chief Complaint  Patient presents with  . Medical Management of Chronic Issues    Routine Visit  . Health Maintenance    Influenza vacc.    HPI: Patient is 84 y.o. female who is being seen for routine issues of dementia, chronic kidney disease stage III and vitamin D deficiency.  Past Medical History:  Diagnosis Date  . Acute encephalopathy 03/29/2015  . Cardiac arrhythmia 12/13/2012   Per pt, takes atenolol, pradaxa. asymptomatic.   Marland Kitchen Chronic atrial fibrillation   . Chronic renal insufficiency, stage III (moderate) (HCC)   . CKD (chronic kidney disease) stage 3, GFR 30-59 ml/min (HCC) 09/17/2015  . Closed fracture of neck of left femur ([place])   . Confusion 06/20/2015  . Constipation   . CVA (cerebral infarction) approx 2012   residual defect: poor R sided peripheral vision per son  . Dementia with behavioral disturbance ([place]) 11/08/2015  . Depression   . Femoral neck fracture ([place]) 12/01/2015  . Hip fracture ([place]) 03/20/2015  . History of depression 12/11/2012   Pt reports 40 pound weight loss. No current Tx.   Marland Kitchen History of fracture of right hip 03/2015  . Hyperlipidemia   . Hypertension   . Hypothyroidism   . Insomnia   . Osteoarthritis    wrists, hands  . Osteoporosis   . Urinary incontinence 12/13/2012  . Vitamin D deficiency 03/29/2015    Past Surgical History:  Procedure Laterality  Date  . APPENDECTOMY    . COLONOSCOPY  08/22/08  . INTRAMEDULLARY (IM) NAIL INTERTROCHANTERIC Right 03/22/2015   Procedure: RIGHT femoral HIP NAILING;  Surgeon: Gaynelle Arabian, MD;  [facility];  Service: Orthopedics;  Laterality: Right;  . KNEE ARTHROSCOPY Right   . THYROID SURGERY      Allergies as of 11/14/2017   No Known Allergies     Medication List        Accurate as of 11/14/17 11:59 PM. Always use your most recent med list.          acetaminophen 325 MG tablet Commonly known as:  TYLENOL Take 650 mg by mouth 3 (three) times daily.   atenolol 50 MG tablet Commonly known as:  TENORMIN Take 50 mg by mouth daily.   ELIQUIS 2.5 MG Tabs tablet Generic drug:  apixaban Take 2.5 mg by mouth 2 (two) times daily. 9am and 5pm   feeding supplement Liqd Take 1 Container by mouth 3 (three) times daily between meals.   levothyroxine 25 MCG tablet Commonly known as:  SYNTHROID, LEVOTHROID Take 25 mcg by mouth daily before breakfast.   ondansetron 4 MG disintegrating tablet Commonly known as:  ZOFRAN-ODT Take 4 mg by mouth every 8 (eight) hours as needed for nausea or vomiting.   QUEtiapine 25 MG tablet Commonly known as:  SEROQUEL Take 25 mg by mouth 2 (two) times daily.   senna 8.6  MG tablet Commonly known as:  SENOKOT Take 1 tablet by mouth at bedtime as needed for constipation.   tiZANidine 2 MG tablet Commonly known as:  ZANAFLEX Take 2 mg by mouth every 8 (eight) hours as needed for muscle spasms.   Vitamin D3 1.25 MG (50000 UT) Caps Take by mouth. TAKE 1 CAPSULE BY MOUTH ONCE A WEEK FOR 16 WEEKS FOR VITAMIN D. DEFICIENCY       No orders of the defined types were placed in this encounter.   Immunization History  Administered Date(s) Administered  . Influenza Split 11/20/2012  . Influenza-Unspecified 10/07/2015, 10/18/2016  . PPD Test 08/14/2015  . Pneumococcal Conjugate-13 05/22/2015  . Pneumococcal Polysaccharide-23 08/22/2008  . Tdap 01/01/2015,  08/06/2015, 01/10/2016    Social History   Tobacco Use  . Smoking status: Never Smoker  . Smokeless tobacco: Never Used  Substance Use Topics  . Alcohol use: No    Review of Systems   unable to obtain secondary to dementia; nursing-no acute concerns     Vitals:   11/14/17 1201  BP: 99/67  Pulse: 62  Resp: 18  Temp: (!) 96.8 F (36 C)   Body mass index is 18.54 kg/m. Physical Exam  GENERAL APPEARANCE: Alert, conversant, No acute distress  SKIN: No diaphoresis rash HEENT: Unremarkable RESPIRATORY: Breathing is even, unlabored. Lung sounds are clear   CARDIOVASCULAR: Heart RRR no murmurs, rubs or gallops. No peripheral edema  GASTROINTESTINAL: Abdomen is soft, non-tender, not distended w/ normal bowel sounds.  GENITOURINARY: Bladder non tender, not distended  MUSCULOSKELETAL: No abnormal joints or musculature NEUROLOGIC: Cranial nerves 2-12 grossly intact. Moves all extremities PSYCHIATRIC: Dementia, no behavioral issues  Patient Active Problem List   Diagnosis Date Noted  . Failure to thrive in adult 07/14/2016  . Femoral neck fracture ([place]) 12/01/2015  . Malnutrition of moderate degree 12/01/2015  . Closed fracture of neck of left femur ([place])   . Mood disorder ([place]) 11/19/2015  . Dementia with behavioral disturbance ([place]) 11/08/2015  . CKD (chronic kidney disease) stage 3, GFR 30-59 ml/min (HCC) 09/17/2015  . Insomnia 08/18/2015  . Confusion 06/20/2015  . Recent urinary tract infection 06/20/2015  . Dysuria 04/10/2015  . Edema 04/06/2015  . Acute encephalopathy 03/29/2015  . Vitamin D deficiency 03/29/2015  . UTI (lower urinary tract infection) 03/21/2015  . Hip fracture ([place]) 03/20/2015  . Chronic atrial fibrillation 02/20/2015  . Hyperlipidemia 02/20/2015  . Essential hypertension, benign 12/13/2012  . Cardiac arrhythmia 12/13/2012  . Urinary incontinence 12/13/2012  . Hypothyroidism 12/13/2012  . History of depression 12/11/2012    CMP       Component Value Date/Time   NA 144 10/13/2017   K 4.0 10/13/2017   CL 103 01/10/2016 0030   CO2 23 01/10/2016 0030   GLUCOSE 111 (H) 01/10/2016 0030   BUN 37 (A) 10/13/2017   CREATININE 1.3 (A) 10/13/2017   CREATININE 1.26 (H) 01/10/2016 0030   CALCIUM 8.6 (L) 01/10/2016 0030   PROT 6.9 12/01/2015 0506   ALBUMIN 3.6 12/01/2015 0506   AST 16 10/13/2017   ALT 9 10/13/2017   ALKPHOS 68 10/13/2017   BILITOT 0.6 12/01/2015 0506   GFRNONAA 36 (L) 01/10/2016 0030   GFRAA 41 (L) 01/10/2016 0030   Recent Labs    12/03/16 10/13/17  NA 143 144  K 4.1 4.0  BUN 19 37*  CREATININE 1.0 1.3*   Recent Labs    12/03/16 10/13/17  AST 16 16  ALT 9 9  ALKPHOS 71  68   Recent Labs    12/03/16 10/13/17  WBC 5.8 5.2  HGB 12.1 10.9*  HCT 37 33*  PLT 255 218   Recent Labs    12/03/16 10/13/17  CHOL 117 131  LDLCALC 46 67  TRIG 81 73   No results found for: [facility] Lab Results  Component Value Date   TSH 0.47 10/13/2017   Lab Results  Component Value Date   HGBA1C 5.6 10/13/2017   Lab Results  Component Value Date   CHOL 131 10/13/2017   HDL 49 10/13/2017   LDLCALC 67 10/13/2017   TRIG 73 10/13/2017    Significant Diagnostic Results in last 30 days:  No results found.  Assessment and Plan  Dementia with behavioral disturbance Chronic and stable, on no meds; continue supportive care  CKD (chronic kidney disease) stage 3, GFR 30-59 ml/min No recent GFR; creatinine 1.3 which is slightly elevated from prior; monitor intervals  Vitamin D deficiency Most recent level 20.3; continue replacement 50,000 units weekly    Marie Robertson. Marie Coil, MD

## 2017-11-15 ENCOUNTER — Encounter: Payer: Self-pay | Admitting: Internal Medicine

## 2017-11-15 NOTE — Assessment & Plan Note (Signed)
Chronic and stable, on no meds; continue supportive care

## 2017-11-15 NOTE — Assessment & Plan Note (Signed)
Most recent level 20.3; continue replacement 50,000 units weekly

## 2017-11-15 NOTE — Assessment & Plan Note (Signed)
No recent GFR; creatinine 1.3 which is slightly elevated from prior; monitor intervals

## 2017-12-03 DIAGNOSIS — G4700 Insomnia, unspecified: Secondary | ICD-10-CM | POA: Diagnosis not present

## 2017-12-03 DIAGNOSIS — F419 Anxiety disorder, unspecified: Secondary | ICD-10-CM | POA: Diagnosis not present

## 2017-12-03 DIAGNOSIS — R443 Hallucinations, unspecified: Secondary | ICD-10-CM | POA: Diagnosis not present

## 2017-12-15 ENCOUNTER — Encounter: Payer: Self-pay | Admitting: Internal Medicine

## 2017-12-15 ENCOUNTER — Non-Acute Institutional Stay (SKILLED_NURSING_FACILITY): Payer: Medicare Other | Admitting: Internal Medicine

## 2017-12-15 DIAGNOSIS — F39 Unspecified mood [affective] disorder: Secondary | ICD-10-CM | POA: Diagnosis not present

## 2017-12-15 DIAGNOSIS — E559 Vitamin D deficiency, unspecified: Secondary | ICD-10-CM | POA: Diagnosis not present

## 2017-12-15 DIAGNOSIS — R627 Adult failure to thrive: Secondary | ICD-10-CM

## 2017-12-15 NOTE — Progress Notes (Signed)
Location:  [facility] Room Number: [phone] Place of Service:  SNF [phone])  [name], MD  Patient Care Team: [name], MD as PCP - General (Internal Medicine)  Extended Emergency Contact Information Primary Emergency Contact: [facility] Address: [address]          [place], [place] [address] Johnnette Litter of [place] Phone: [phone] Relation: Son Secondary Emergency Contact: [place],  [address] Johnnette Litter of [place] Phone: [phone] Relation: Son    Allergies: Patient has no known allergies.  Chief Complaint  Patient presents with  . Medical Management of Chronic Issues    Routine Visit    HPI: Patient is 84 y.o. female who is being seen for routine issues of vitamin D deficiency, mood disorder, and failure to thrive.  Past Medical History:  Diagnosis Date  . Acute encephalopathy 03/29/2015  . Cardiac arrhythmia 12/13/2012   Per pt, takes atenolol, pradaxa. asymptomatic.   Marland Kitchen Chronic atrial fibrillation   . Chronic renal insufficiency, stage III (moderate) (HCC)   . CKD (chronic kidney disease) stage 3, GFR 30-59 ml/min (HCC) 09/17/2015  . Closed fracture of neck of left femur ([place])   . Confusion 06/20/2015  . Constipation   . CVA (cerebral infarction) approx 2012   residual defect: poor R sided peripheral vision per son  . Dementia with behavioral disturbance ([place]) 11/08/2015  . Depression   . Femoral neck fracture ([place]) 12/01/2015  . Hip fracture ([place]) 03/20/2015  . History of depression 12/11/2012   Pt reports 40 pound weight loss. No current Tx.   Marland Kitchen History of fracture of right hip 03/2015  . Hyperlipidemia   . Hypertension   . Hypothyroidism   . Insomnia   . Osteoarthritis    wrists, hands  . Osteoporosis   . Urinary incontinence 12/13/2012  . Vitamin D deficiency 03/29/2015    Past Surgical History:  Procedure Laterality Date  . APPENDECTOMY    . COLONOSCOPY  08/22/08  .  INTRAMEDULLARY (IM) NAIL INTERTROCHANTERIC Right 03/22/2015   Procedure: RIGHT femoral HIP NAILING;  Surgeon: Gaynelle Arabian, MD;  Location: [facility];  Service: Orthopedics;  Laterality: Right;  . KNEE ARTHROSCOPY Right   . THYROID SURGERY      Allergies as of 12/15/2017   No Known Allergies     Medication List       Accurate as of December 15, 2017 11:59 PM. Always use your most recent med list.        acetaminophen 325 MG tablet Commonly known as:  TYLENOL Take 650 mg by mouth 3 (three) times daily.   atenolol 50 MG tablet Commonly known as:  TENORMIN Take 50 mg by mouth daily.   ELIQUIS 2.5 MG Tabs tablet Generic drug:  apixaban Take 2.5 mg by mouth 2 (two) times daily. 9am and 5pm   feeding supplement Liqd Take 1 Container by mouth 3 (three) times daily between meals.   levothyroxine 25 MCG tablet Commonly known as:  SYNTHROID, LEVOTHROID Take 25 mcg by mouth daily before breakfast.   ondansetron 4 MG disintegrating tablet Commonly known as:  ZOFRAN-ODT Take 4 mg by mouth every 8 (eight) hours as needed for nausea or vomiting.   QUEtiapine 25 MG tablet Commonly known as:  SEROQUEL Take 25 mg by mouth 2 (two) times daily.   senna 8.6 MG tablet Commonly known as:  SENOKOT Take 1 tablet by mouth  at bedtime as needed for constipation.   tiZANidine 2 MG tablet Commonly known as:  ZANAFLEX Take 2 mg by mouth every 8 (eight) hours as needed for muscle spasms.   Vitamin D3 1.25 MG (50000 UT) Caps Take by mouth. TAKE 1 CAPSULE BY MOUTH ONCE A WEEK FOR 16 WEEKS FOR VITAMIN D. DEFICIENCY       No orders of the defined types were placed in this encounter.   Immunization History  Administered Date(s) Administered  . Influenza Split 11/20/2012  . Influenza-Unspecified 10/07/2015, 10/18/2016  . PPD Test 08/14/2015  . Pneumococcal Conjugate-13 05/22/2015  . Pneumococcal Polysaccharide-23 08/22/2008  . Tdap 01/01/2015, 08/06/2015, 01/10/2016    Social History    Tobacco Use  . Smoking status: Never Smoker  . Smokeless tobacco: Never Used  Substance Use Topics  . Alcohol use: No    Review of Systems unable to obtain secondary to dementia; nursing-no acute concerns, chronic concern for poor p.o. intake   Vitals:   12/15/17 1305  BP: 128/72  Pulse: 64  Resp: 18  Temp: 97.9 F (36.6 C)  SpO2: 93%   Body mass index is 18.07 kg/m. Physical Exam  GENERAL APPEARANCE: Alert, conversant, No acute distress  SKIN: No diaphoresis rash HEENT: Unremarkable RESPIRATORY: Breathing is even, unlabored. Lung sounds are clear   CARDIOVASCULAR: Heart RRR no murmurs, rubs or gallops. No peripheral edema  GASTROINTESTINAL: Abdomen is soft, non-tender, not distended w/ normal bowel sounds.  GENITOURINARY: Bladder non tender, not distended  MUSCULOSKELETAL: No abnormal joints or musculature NEUROLOGIC: Cranial nerves 2-12 grossly intact. Moves all extremities PSYCHIATRIC: [place], occasional behavioral issues  Patient Active Problem List   Diagnosis Date Noted  . Failure to thrive in adult 07/14/2016  . Femoral neck fracture ([place]) 12/01/2015  . Malnutrition of moderate degree 12/01/2015  . Closed fracture of neck of left femur ([place])   . Mood disorder ([place]) 11/19/2015  . Dementia with behavioral disturbance ([place]) 11/08/2015  . CKD (chronic kidney disease) stage 3, GFR 30-59 ml/min (HCC) 09/17/2015  . Insomnia 08/18/2015  . Confusion 06/20/2015  . Recent urinary tract infection 06/20/2015  . Dysuria 04/10/2015  . Edema 04/06/2015  . Acute encephalopathy 03/29/2015  . Vitamin D deficiency 03/29/2015  . UTI (lower urinary tract infection) 03/21/2015  . Hip fracture ([place]) 03/20/2015  . Chronic atrial fibrillation 02/20/2015  . Hyperlipidemia 02/20/2015  . Essential hypertension, benign 12/13/2012  . Cardiac arrhythmia 12/13/2012  . Urinary incontinence 12/13/2012  . Hypothyroidism 12/13/2012  . History of depression 12/11/2012    CMP      Component Value Date/Time   NA 144 10/13/2017   K 4.0 10/13/2017   CL 103 01/10/2016 0030   CO2 23 01/10/2016 0030   GLUCOSE 111 (H) 01/10/2016 0030   BUN 37 (A) 10/13/2017   CREATININE 1.3 (A) 10/13/2017   CREATININE 1.26 (H) 01/10/2016 0030   CALCIUM 8.6 (L) 01/10/2016 0030   PROT 6.9 12/01/2015 0506   ALBUMIN 3.6 12/01/2015 0506   AST 16 10/13/2017   ALT 9 10/13/2017   ALKPHOS 68 10/13/2017   BILITOT 0.6 12/01/2015 0506   GFRNONAA 36 (L) 01/10/2016 0030   GFRAA 41 (L) 01/10/2016 0030   Recent Labs    10/13/17  NA 144  K 4.0  BUN 37*  CREATININE 1.3*   Recent Labs    10/13/17  AST 16  ALT 9  ALKPHOS 68   Recent Labs    10/13/17  WBC 5.2  HGB 10.9*  HCT 33*  PLT 218   Recent Labs    10/13/17  CHOL 131  LDLCALC 67  TRIG 73   No results found for: [facility] Lab Results  Component Value Date   TSH 0.47 10/13/2017   Lab Results  Component Value Date   HGBA1C 5.6 10/13/2017   Lab Results  Component Value Date   CHOL 131 10/13/2017   HDL 49 10/13/2017   LDLCALC 67 10/13/2017   TRIG 73 10/13/2017    Significant Diagnostic Results in last 30 days:  No results found.  Assessment and Plan  Vitamin D deficiency Recent level 20; continue vitamin D 50,000 units daily  Mood disorder (HCC) Controlled; continue Seroquel 25 mg twice daily  Failure to thrive in adult Encourage to eat and drink; continue supplements and Protostat      D. [name], MD

## 2017-12-21 ENCOUNTER — Encounter: Payer: Self-pay | Admitting: Internal Medicine

## 2017-12-21 NOTE — Assessment & Plan Note (Signed)
Recent level 20; continue vitamin D 50,000 units daily

## 2017-12-21 NOTE — Assessment & Plan Note (Signed)
Controlled; continue Seroquel 25 mg twice daily

## 2017-12-21 NOTE — Assessment & Plan Note (Signed)
Encourage to eat and drink; continue supplements and Protostat

## 2017-12-29 DIAGNOSIS — F0390 Unspecified dementia without behavioral disturbance: Secondary | ICD-10-CM | POA: Diagnosis not present

## 2017-12-29 DIAGNOSIS — F209 Schizophrenia, unspecified: Secondary | ICD-10-CM | POA: Diagnosis not present

## 2017-12-29 DIAGNOSIS — F419 Anxiety disorder, unspecified: Secondary | ICD-10-CM | POA: Diagnosis not present

## 2017-12-29 DIAGNOSIS — R4589 Other symptoms and signs involving emotional state: Secondary | ICD-10-CM | POA: Diagnosis not present

## 2018-01-15 ENCOUNTER — Encounter: Payer: Self-pay | Admitting: Internal Medicine

## 2018-01-15 ENCOUNTER — Non-Acute Institutional Stay (SKILLED_NURSING_FACILITY): Payer: Medicare Other | Admitting: Internal Medicine

## 2018-01-15 DIAGNOSIS — I482 Chronic atrial fibrillation, unspecified: Secondary | ICD-10-CM

## 2018-01-15 DIAGNOSIS — I10 Essential (primary) hypertension: Secondary | ICD-10-CM | POA: Diagnosis not present

## 2018-01-15 DIAGNOSIS — R627 Adult failure to thrive: Secondary | ICD-10-CM

## 2018-01-15 DIAGNOSIS — R1312 Dysphagia, oropharyngeal phase: Secondary | ICD-10-CM | POA: Diagnosis not present

## 2018-01-15 DIAGNOSIS — F0390 Unspecified dementia without behavioral disturbance: Secondary | ICD-10-CM | POA: Diagnosis not present

## 2018-01-15 DIAGNOSIS — I69828 Other speech and language deficits following other cerebrovascular disease: Secondary | ICD-10-CM | POA: Diagnosis not present

## 2018-01-15 NOTE — Progress Notes (Signed)
Location:  [facility] Room Number: [phone] Place of Service:  SNF [phone])  Noah Delaine. [name], MD  Patient Care Team: [name], MD as PCP - General (Internal Medicine)  Extended Emergency Contact Information Primary Emergency Contact: [facility] Address: [address]          [place], [place] [address] Johnnette Litter of [place] Phone: [phone] Relation: Son Secondary Emergency Contact: [place], [place] [address] Johnnette Litter of [place] Phone: [phone] Relation: Son    Allergies: Patient has no known allergies.  Chief Complaint  Patient presents with  . Medical Management of Chronic Issues    Routine Visit    HPI: Patient is 85 y.o. female who is being seen for routine issues of hypertension, chronic atrial fibrillation, and failure to thrive.  Past Medical History:  Diagnosis Date  . Acute encephalopathy 03/29/2015  . Cardiac arrhythmia 12/13/2012   Per pt, takes atenolol, pradaxa. asymptomatic.   Marland Kitchen Chronic atrial fibrillation   . Chronic renal insufficiency, stage III (moderate) (HCC)   . CKD (chronic kidney disease) stage 3, GFR 30-59 ml/min (HCC) 09/17/2015  . Closed fracture of neck of left femur ([place])   . Confusion 06/20/2015  . Constipation   . CVA (cerebral infarction) approx 2012   residual defect: poor R sided peripheral vision per son  . Dementia with behavioral disturbance ([place]) 11/08/2015  . Depression   . Femoral neck fracture ([place]) 12/01/2015  . Hip fracture ([place]) 03/20/2015  . History of depression 12/11/2012   Pt reports 40 pound weight loss. No current Tx.   Marland Kitchen History of fracture of right hip 03/2015  . Hyperlipidemia   . Hypertension   . Hypothyroidism   . Insomnia   . Osteoarthritis    wrists, hands  . Osteoporosis   . Urinary incontinence 12/13/2012  . Vitamin D deficiency 03/29/2015    Past Surgical History:  Procedure Laterality Date  . APPENDECTOMY    . COLONOSCOPY   08/22/08  . INTRAMEDULLARY (IM) NAIL INTERTROCHANTERIC Right 03/22/2015   Procedure: RIGHT femoral HIP NAILING;  Surgeon: Gaynelle Arabian, MD;  Location: [facility];  Service: Orthopedics;  Laterality: Right;  . KNEE ARTHROSCOPY Right   . THYROID SURGERY      Allergies as of 01/15/2018   No Known Allergies     Medication List       Accurate as of January 15, 2018 11:59 PM. Always use your most recent med list.        acetaminophen 325 MG tablet Commonly known as:  TYLENOL Take 650 mg by mouth 3 (three) times daily.   atenolol 50 MG tablet Commonly known as:  TENORMIN Take 50 mg by mouth daily.   ELIQUIS 2.5 MG Tabs tablet Generic drug:  apixaban Take 2.5 mg by mouth 2 (two) times daily. 9am and 5pm   feeding supplement Liqd Take 1 Container by mouth 3 (three) times daily between meals.   levothyroxine 25 MCG tablet Commonly known as:  SYNTHROID, LEVOTHROID Take 25 mcg by mouth daily before breakfast.   ondansetron 4 MG disintegrating tablet Commonly known as:  ZOFRAN-ODT Take 4 mg by mouth every 8 (eight) hours as needed for nausea or vomiting.   QUEtiapine 25 MG tablet Commonly known as:  SEROQUEL Take 25 mg by mouth 2 (two) times daily.   senna 8.6 MG tablet Commonly known as:  SENOKOT Take 1 tablet by mouth  at bedtime as needed for constipation.   tiZANidine 2 MG tablet Commonly known as:  ZANAFLEX Take 2 mg by mouth every 8 (eight) hours as needed for muscle spasms.   Vitamin D3 1.25 MG (50000 UT) Caps Take by mouth. TAKE 1 CAPSULE BY MOUTH ONCE A WEEK FOR 16 WEEKS FOR VITAMIN D. DEFICIENCY       No orders of the defined types were placed in this encounter.   Immunization History  Administered Date(s) Administered  . Influenza Split 11/20/2012  . Influenza-Unspecified 10/07/2015, 10/18/2016  . PPD Test 08/14/2015  . Pneumococcal Conjugate-13 05/22/2015  . Pneumococcal Polysaccharide-23 08/22/2008  . Tdap 01/01/2015, 08/06/2015, 01/10/2016    Social  History   Tobacco Use  . Smoking status: Never Smoker  . Smokeless tobacco: Never Used  Substance Use Topics  . Alcohol use: No    Review of Systems unable to obtain secondary to dementia; nursing- concern for continued poor p.o. intake   Vitals:   01/15/18 1045  BP: 119/82  Pulse: 73  Resp: 20  Temp: (!) 97.1 F (36.2 C)   Body mass index is 16.72 kg/m. Physical Exam  GENERAL APPEARANCE: Alert, conversant, No acute distress  SKIN: No diaphoresis rash HEENT: Unremarkable RESPIRATORY: Breathing is even, unlabored. Lung sounds are clear   CARDIOVASCULAR: Heart RRR no murmurs, rubs or gallops. No peripheral edema  GASTROINTESTINAL: Abdomen is soft, non-tender, not distended w/ normal bowel sounds.  GENITOURINARY: Bladder non tender, not distended  MUSCULOSKELETAL: No abnormal joints or musculature NEUROLOGIC: Cranial nerves 2-12 grossly intact. Moves all extremities PSYCHIATRIC: Dementia, no behavioral issues  Patient Active Problem List   Diagnosis Date Noted  . Failure to thrive in adult 07/14/2016  . Femoral neck fracture ([place]) 12/01/2015  . Malnutrition of moderate degree 12/01/2015  . Closed fracture of neck of left femur ([place])   . Mood disorder ([place]) 11/19/2015  . Dementia with behavioral disturbance ([place]) 11/08/2015  . CKD (chronic kidney disease) stage 3, GFR 30-59 ml/min (HCC) 09/17/2015  . Insomnia 08/18/2015  . Confusion 06/20/2015  . Recent urinary tract infection 06/20/2015  . Dysuria 04/10/2015  . Edema 04/06/2015  . Acute encephalopathy 03/29/2015  . Vitamin D deficiency 03/29/2015  . UTI (lower urinary tract infection) 03/21/2015  . Hip fracture ([place]) 03/20/2015  . Chronic atrial fibrillation 02/20/2015  . Hyperlipidemia 02/20/2015  . Essential hypertension, benign 12/13/2012  . Cardiac arrhythmia 12/13/2012  . Urinary incontinence 12/13/2012  . Hypothyroidism 12/13/2012  . History of depression 12/11/2012    CMP     Component Value  Date/Time   NA 144 10/13/2017   K 4.0 10/13/2017   CL 103 01/10/2016 0030   CO2 23 01/10/2016 0030   GLUCOSE 111 (H) 01/10/2016 0030   BUN 37 (A) 10/13/2017   CREATININE 1.3 (A) 10/13/2017   CREATININE 1.26 (H) 01/10/2016 0030   CALCIUM 8.6 (L) 01/10/2016 0030   PROT 6.9 12/01/2015 0506   ALBUMIN 3.6 12/01/2015 0506   AST 16 10/13/2017   ALT 9 10/13/2017   ALKPHOS 68 10/13/2017   BILITOT 0.6 12/01/2015 0506   GFRNONAA 36 (L) 01/10/2016 0030   GFRAA 41 (L) 01/10/2016 0030   Recent Labs    10/13/17  NA 144  K 4.0  BUN 37*  CREATININE 1.3*   Recent Labs    10/13/17  AST 16  ALT 9  ALKPHOS 68   Recent Labs    10/13/17  WBC 5.2  HGB 10.9*  HCT 33*  PLT 218  Recent Labs    10/13/17  CHOL 131  LDLCALC 67  TRIG 73   No results found for: [facility] Lab Results  Component Value Date   TSH 0.47 10/13/2017   Lab Results  Component Value Date   HGBA1C 5.6 10/13/2017   Lab Results  Component Value Date   CHOL 131 10/13/2017   HDL 49 10/13/2017   LDLCALC 67 10/13/2017   TRIG 73 10/13/2017    Significant Diagnostic Results in last 30 days:  No results found.  Assessment and Plan  Essential hypertension, benign Controlled for age; continue Tenormin 50 mg daily  Chronic atrial fibrillation (HCC) Chronic and stable; continue Tenormin 50 mg daily for rate and Eliquis 2.5 mg twice daily for prophylaxis  Failure to thrive in adult Continues, patient has good days where she eats a little and bad days; continue supportive care    Webb Silversmith D. [name], MD

## 2018-01-16 DIAGNOSIS — F0390 Unspecified dementia without behavioral disturbance: Secondary | ICD-10-CM | POA: Diagnosis not present

## 2018-01-16 DIAGNOSIS — R1312 Dysphagia, oropharyngeal phase: Secondary | ICD-10-CM | POA: Diagnosis not present

## 2018-01-16 DIAGNOSIS — I69828 Other speech and language deficits following other cerebrovascular disease: Secondary | ICD-10-CM | POA: Diagnosis not present

## 2018-01-17 ENCOUNTER — Encounter: Payer: Self-pay | Admitting: Internal Medicine

## 2018-01-17 NOTE — Assessment & Plan Note (Signed)
Chronic and stable; continue Tenormin 50 mg daily for rate and Eliquis 2.5 mg twice daily for prophylaxis

## 2018-01-17 NOTE — Assessment & Plan Note (Signed)
Controlled for age; continue Tenormin 50 mg daily

## 2018-01-17 NOTE — Assessment & Plan Note (Signed)
Continues, patient has good days where she eats a little and bad days; continue supportive care

## 2018-01-19 DIAGNOSIS — F0390 Unspecified dementia without behavioral disturbance: Secondary | ICD-10-CM | POA: Diagnosis not present

## 2018-01-19 DIAGNOSIS — I69828 Other speech and language deficits following other cerebrovascular disease: Secondary | ICD-10-CM | POA: Diagnosis not present

## 2018-01-19 DIAGNOSIS — R1312 Dysphagia, oropharyngeal phase: Secondary | ICD-10-CM | POA: Diagnosis not present

## 2018-01-20 DIAGNOSIS — I69828 Other speech and language deficits following other cerebrovascular disease: Secondary | ICD-10-CM | POA: Diagnosis not present

## 2018-01-20 DIAGNOSIS — F0390 Unspecified dementia without behavioral disturbance: Secondary | ICD-10-CM | POA: Diagnosis not present

## 2018-01-20 DIAGNOSIS — R1312 Dysphagia, oropharyngeal phase: Secondary | ICD-10-CM | POA: Diagnosis not present

## 2018-01-21 DIAGNOSIS — F419 Anxiety disorder, unspecified: Secondary | ICD-10-CM | POA: Diagnosis not present

## 2018-01-21 DIAGNOSIS — F0390 Unspecified dementia without behavioral disturbance: Secondary | ICD-10-CM | POA: Diagnosis not present

## 2018-01-21 DIAGNOSIS — I69828 Other speech and language deficits following other cerebrovascular disease: Secondary | ICD-10-CM | POA: Diagnosis not present

## 2018-01-21 DIAGNOSIS — R1312 Dysphagia, oropharyngeal phase: Secondary | ICD-10-CM | POA: Diagnosis not present

## 2018-01-21 DIAGNOSIS — F209 Schizophrenia, unspecified: Secondary | ICD-10-CM | POA: Diagnosis not present

## 2018-01-22 DIAGNOSIS — F0390 Unspecified dementia without behavioral disturbance: Secondary | ICD-10-CM | POA: Diagnosis not present

## 2018-01-22 DIAGNOSIS — R1312 Dysphagia, oropharyngeal phase: Secondary | ICD-10-CM | POA: Diagnosis not present

## 2018-01-22 DIAGNOSIS — I69828 Other speech and language deficits following other cerebrovascular disease: Secondary | ICD-10-CM | POA: Diagnosis not present

## 2018-01-23 DIAGNOSIS — R1312 Dysphagia, oropharyngeal phase: Secondary | ICD-10-CM | POA: Diagnosis not present

## 2018-01-23 DIAGNOSIS — I69828 Other speech and language deficits following other cerebrovascular disease: Secondary | ICD-10-CM | POA: Diagnosis not present

## 2018-01-23 DIAGNOSIS — F0390 Unspecified dementia without behavioral disturbance: Secondary | ICD-10-CM | POA: Diagnosis not present

## 2018-01-26 DIAGNOSIS — I69828 Other speech and language deficits following other cerebrovascular disease: Secondary | ICD-10-CM | POA: Diagnosis not present

## 2018-01-26 DIAGNOSIS — F0390 Unspecified dementia without behavioral disturbance: Secondary | ICD-10-CM | POA: Diagnosis not present

## 2018-01-26 DIAGNOSIS — R1312 Dysphagia, oropharyngeal phase: Secondary | ICD-10-CM | POA: Diagnosis not present

## 2018-01-27 DIAGNOSIS — F0390 Unspecified dementia without behavioral disturbance: Secondary | ICD-10-CM | POA: Diagnosis not present

## 2018-01-27 DIAGNOSIS — I69828 Other speech and language deficits following other cerebrovascular disease: Secondary | ICD-10-CM | POA: Diagnosis not present

## 2018-01-27 DIAGNOSIS — R1312 Dysphagia, oropharyngeal phase: Secondary | ICD-10-CM | POA: Diagnosis not present

## 2018-01-28 DIAGNOSIS — I69828 Other speech and language deficits following other cerebrovascular disease: Secondary | ICD-10-CM | POA: Diagnosis not present

## 2018-01-28 DIAGNOSIS — R1312 Dysphagia, oropharyngeal phase: Secondary | ICD-10-CM | POA: Diagnosis not present

## 2018-01-28 DIAGNOSIS — F0390 Unspecified dementia without behavioral disturbance: Secondary | ICD-10-CM | POA: Diagnosis not present

## 2018-01-29 DIAGNOSIS — F0390 Unspecified dementia without behavioral disturbance: Secondary | ICD-10-CM | POA: Diagnosis not present

## 2018-01-29 DIAGNOSIS — I69828 Other speech and language deficits following other cerebrovascular disease: Secondary | ICD-10-CM | POA: Diagnosis not present

## 2018-01-29 DIAGNOSIS — R1312 Dysphagia, oropharyngeal phase: Secondary | ICD-10-CM | POA: Diagnosis not present

## 2018-01-30 DIAGNOSIS — E559 Vitamin D deficiency, unspecified: Secondary | ICD-10-CM | POA: Diagnosis not present

## 2018-01-30 DIAGNOSIS — R1312 Dysphagia, oropharyngeal phase: Secondary | ICD-10-CM | POA: Diagnosis not present

## 2018-01-30 DIAGNOSIS — I69828 Other speech and language deficits following other cerebrovascular disease: Secondary | ICD-10-CM | POA: Diagnosis not present

## 2018-01-30 DIAGNOSIS — F0390 Unspecified dementia without behavioral disturbance: Secondary | ICD-10-CM | POA: Diagnosis not present

## 2018-01-30 LAB — VITAMIN D 25 HYDROXY (VIT D DEFICIENCY, FRACTURES): VIT D 25 HYDROXY: 53.07

## 2018-02-02 DIAGNOSIS — I69828 Other speech and language deficits following other cerebrovascular disease: Secondary | ICD-10-CM | POA: Diagnosis not present

## 2018-02-02 DIAGNOSIS — F0390 Unspecified dementia without behavioral disturbance: Secondary | ICD-10-CM | POA: Diagnosis not present

## 2018-02-02 DIAGNOSIS — R1312 Dysphagia, oropharyngeal phase: Secondary | ICD-10-CM | POA: Diagnosis not present

## 2018-02-03 DIAGNOSIS — R1312 Dysphagia, oropharyngeal phase: Secondary | ICD-10-CM | POA: Diagnosis not present

## 2018-02-03 DIAGNOSIS — F0390 Unspecified dementia without behavioral disturbance: Secondary | ICD-10-CM | POA: Diagnosis not present

## 2018-02-03 DIAGNOSIS — I69828 Other speech and language deficits following other cerebrovascular disease: Secondary | ICD-10-CM | POA: Diagnosis not present

## 2018-02-04 DIAGNOSIS — I69828 Other speech and language deficits following other cerebrovascular disease: Secondary | ICD-10-CM | POA: Diagnosis not present

## 2018-02-04 DIAGNOSIS — R1312 Dysphagia, oropharyngeal phase: Secondary | ICD-10-CM | POA: Diagnosis not present

## 2018-02-04 DIAGNOSIS — F0390 Unspecified dementia without behavioral disturbance: Secondary | ICD-10-CM | POA: Diagnosis not present

## 2018-02-05 DIAGNOSIS — I69828 Other speech and language deficits following other cerebrovascular disease: Secondary | ICD-10-CM | POA: Diagnosis not present

## 2018-02-05 DIAGNOSIS — R1312 Dysphagia, oropharyngeal phase: Secondary | ICD-10-CM | POA: Diagnosis not present

## 2018-02-05 DIAGNOSIS — F0390 Unspecified dementia without behavioral disturbance: Secondary | ICD-10-CM | POA: Diagnosis not present

## 2018-02-08 DIAGNOSIS — R1312 Dysphagia, oropharyngeal phase: Secondary | ICD-10-CM | POA: Diagnosis not present

## 2018-02-08 DIAGNOSIS — I69828 Other speech and language deficits following other cerebrovascular disease: Secondary | ICD-10-CM | POA: Diagnosis not present

## 2018-02-08 DIAGNOSIS — F0390 Unspecified dementia without behavioral disturbance: Secondary | ICD-10-CM | POA: Diagnosis not present

## 2018-02-09 DIAGNOSIS — F0390 Unspecified dementia without behavioral disturbance: Secondary | ICD-10-CM | POA: Diagnosis not present

## 2018-02-09 DIAGNOSIS — I69828 Other speech and language deficits following other cerebrovascular disease: Secondary | ICD-10-CM | POA: Diagnosis not present

## 2018-02-09 DIAGNOSIS — R1312 Dysphagia, oropharyngeal phase: Secondary | ICD-10-CM | POA: Diagnosis not present

## 2018-02-10 DIAGNOSIS — I69828 Other speech and language deficits following other cerebrovascular disease: Secondary | ICD-10-CM | POA: Diagnosis not present

## 2018-02-10 DIAGNOSIS — R1312 Dysphagia, oropharyngeal phase: Secondary | ICD-10-CM | POA: Diagnosis not present

## 2018-02-10 DIAGNOSIS — F0390 Unspecified dementia without behavioral disturbance: Secondary | ICD-10-CM | POA: Diagnosis not present

## 2018-02-11 DIAGNOSIS — F0390 Unspecified dementia without behavioral disturbance: Secondary | ICD-10-CM | POA: Diagnosis not present

## 2018-02-11 DIAGNOSIS — R1312 Dysphagia, oropharyngeal phase: Secondary | ICD-10-CM | POA: Diagnosis not present

## 2018-02-11 DIAGNOSIS — I69828 Other speech and language deficits following other cerebrovascular disease: Secondary | ICD-10-CM | POA: Diagnosis not present

## 2018-02-12 DIAGNOSIS — I69828 Other speech and language deficits following other cerebrovascular disease: Secondary | ICD-10-CM | POA: Diagnosis not present

## 2018-02-12 DIAGNOSIS — F0390 Unspecified dementia without behavioral disturbance: Secondary | ICD-10-CM | POA: Diagnosis not present

## 2018-02-12 DIAGNOSIS — R1312 Dysphagia, oropharyngeal phase: Secondary | ICD-10-CM | POA: Diagnosis not present

## 2018-02-13 ENCOUNTER — Non-Acute Institutional Stay (SKILLED_NURSING_FACILITY): Payer: Medicare Other | Admitting: Internal Medicine

## 2018-02-13 ENCOUNTER — Encounter: Payer: Self-pay | Admitting: Internal Medicine

## 2018-02-13 DIAGNOSIS — G301 Alzheimer's disease with late onset: Secondary | ICD-10-CM

## 2018-02-13 DIAGNOSIS — F02818 Dementia in other diseases classified elsewhere, unspecified severity, with other behavioral disturbance: Secondary | ICD-10-CM

## 2018-02-13 DIAGNOSIS — F0390 Unspecified dementia without behavioral disturbance: Secondary | ICD-10-CM | POA: Diagnosis not present

## 2018-02-13 DIAGNOSIS — N183 Chronic kidney disease, stage 3 unspecified: Secondary | ICD-10-CM

## 2018-02-13 DIAGNOSIS — E559 Vitamin D deficiency, unspecified: Secondary | ICD-10-CM | POA: Diagnosis not present

## 2018-02-13 DIAGNOSIS — F0281 Dementia in other diseases classified elsewhere with behavioral disturbance: Secondary | ICD-10-CM

## 2018-02-13 DIAGNOSIS — R1312 Dysphagia, oropharyngeal phase: Secondary | ICD-10-CM | POA: Diagnosis not present

## 2018-02-13 DIAGNOSIS — I69828 Other speech and language deficits following other cerebrovascular disease: Secondary | ICD-10-CM | POA: Diagnosis not present

## 2018-02-13 NOTE — Progress Notes (Signed)
Location:  [facility] Room Number: [id number] Place of Service:  SNF [phone])  Marie Duos, MD  Patient Care Team: Marie Duos, MD as PCP - General (Internal Medicine)  Extended Emergency Contact Information Primary Emergency Contact: [facility] Address: [address]          [place], [place] [address] Johnnette Litter of [place] Phone: [phone] Relation: Son Secondary Emergency Contact: [place], [place] [address] Johnnette Litter of [place] Phone: [phone] Relation: Son    Allergies: Patient has no known allergies.  Chief Complaint  Patient presents with  . Medical Management of Chronic Issues    Routine Visit    HPI: Patient is 85 y.o. female who is being seen for routine issues of dementia, CKD 3, and vitamin D deficiency.  Past Medical History:  Diagnosis Date  . Acute encephalopathy 03/29/2015  . Cardiac arrhythmia 12/13/2012   Per pt, takes atenolol, pradaxa. asymptomatic.   Marland Kitchen Chronic atrial fibrillation   . Chronic renal insufficiency, stage III (moderate) (HCC)   . CKD (chronic kidney disease) stage 3, GFR 30-59 ml/min (HCC) 09/17/2015  . Closed fracture of neck of left femur ([place])   . Confusion 06/20/2015  . Constipation   . CVA (cerebral infarction) approx 2012   residual defect: poor R sided peripheral vision per son  . Dementia with behavioral disturbance ([place]) 11/08/2015  . Depression   . Femoral neck fracture ([place]) 12/01/2015  . Hip fracture ([place]) 03/20/2015  . History of depression 12/11/2012   Pt reports 40 pound weight loss. No current Tx.   Marland Kitchen History of fracture of right hip 03/2015  . Hyperlipidemia   . Hypertension   . Hypothyroidism   . Insomnia   . Osteoarthritis    wrists, hands  . Osteoporosis   . Urinary incontinence 12/13/2012  . Vitamin D deficiency 03/29/2015    Past Surgical History:  Procedure Laterality Date  . APPENDECTOMY    . COLONOSCOPY  08/22/08  . INTRAMEDULLARY  (IM) NAIL INTERTROCHANTERIC Right 03/22/2015   Procedure: RIGHT femoral HIP NAILING;  Surgeon: Gaynelle Arabian, MD;  Location: [facility];  Service: Orthopedics;  Laterality: Right;  . KNEE ARTHROSCOPY Right   . THYROID SURGERY      Allergies as of 02/13/2018   No Known Allergies     Medication List       Accurate as of February 13, 2018 11:59 PM. Always use your most recent med list.        acetaminophen 325 MG tablet Commonly known as:  TYLENOL Take 650 mg by mouth 3 (three) times daily.   acetaminophen 500 MG tablet Commonly known as:  TYLENOL Take 500 mg by mouth every 6 (six) hours as needed for mild pain, moderate pain or headache.   atenolol 50 MG tablet Commonly known as:  TENORMIN Take 50 mg by mouth daily.   ELIQUIS 2.5 MG Tabs tablet Generic drug:  apixaban Take 2.5 mg by mouth 2 (two) times daily. 9am and 5pm   feeding supplement Liqd Take 1 Container by mouth 3 (three) times daily between meals.   NUTRITIONAL SUPPLEMENT Liqd Take by mouth. Initiate 77mL Medpass TID d/t weight loss.   levothyroxine 25 MCG tablet Commonly known as:  SYNTHROID, LEVOTHROID Take 25 mcg by mouth daily before breakfast.   ondansetron 4 MG disintegrating tablet Commonly known as:  ZOFRAN-ODT Take 4 mg by mouth every 8 (eight) hours  as needed for nausea or vomiting.   QUEtiapine 25 MG tablet Commonly known as:  SEROQUEL Take 25 mg by mouth 2 (two) times daily.   senna 8.6 MG tablet Commonly known as:  SENOKOT Take 1 tablet by mouth at bedtime as needed for constipation.   tiZANidine 2 MG tablet Commonly known as:  ZANAFLEX Take 2 mg by mouth every 8 (eight) hours as needed for muscle spasms.       No orders of the defined types were placed in this encounter.   Immunization History  Administered Date(s) Administered  . Influenza Split 11/20/2012  . Influenza-Unspecified 10/07/2015, 10/18/2016  . PPD Test 08/14/2015  . Pneumococcal Conjugate-13 05/22/2015  . Pneumococcal  Polysaccharide-23 08/22/2008  . Tdap 01/01/2015, 08/06/2015, 01/10/2016    Social History   Tobacco Use  . Smoking status: Never Smoker  . Smokeless tobacco: Never Used  Substance Use Topics  . Alcohol use: No    Review of Systems  DATA OBTAINED: from nurse GENERAL:  no fevers, fatigue, appetite changes SKIN: No itching, rash HEENT: No complaint RESPIRATORY: No cough, wheezing, SOB CARDIAC: No chest pain, palpitations, lower extremity edema  GI: No abdominal pain, No N/V/D or constipation, No heartburn or reflux  GU: No dysuria, frequency or urgency, or incontinence  MUSCULOSKELETAL: No unrelieved bone/joint pain NEUROLOGIC: No headache, dizziness  PSYCHIATRIC: No overt anxiety or sadness  Vitals:   02/13/18 1440  BP: (!) 91/55  Pulse: 70  Resp: 20  Temp: (!) 97.3 F (36.3 C)  SpO2: 97%   Body mass index is 16.34 kg/m. Physical Exam  GENERAL APPEARANCE: Alert, conversant, No acute distress  SKIN: No diaphoresis rash HEENT: Unremarkable RESPIRATORY: Breathing is even, unlabored. Lung sounds are clear   CARDIOVASCULAR: Heart RRR no murmurs, rubs or gallops. No peripheral edema  GASTROINTESTINAL: Abdomen is soft, non-tender, not distended w/ normal bowel sounds.  GENITOURINARY: Bladder non tender, not distended  MUSCULOSKELETAL: No abnormal joints or musculature NEUROLOGIC: Cranial nerves 2-12 grossly intact. Moves all extremities PSYCHIATRIC: Mood and affect appropriate to situation, no behavioral issues  Patient Active Problem List   Diagnosis Date Noted  . Failure to thrive in adult 07/14/2016  . Femoral neck fracture ([place]) 12/01/2015  . Malnutrition of moderate degree 12/01/2015  . Closed fracture of neck of left femur ([place])   . Mood disorder ([place]) 11/19/2015  . Dementia with behavioral disturbance ([place]) 11/08/2015  . CKD (chronic kidney disease) stage 3, GFR 30-59 ml/min (HCC) 09/17/2015  . Insomnia 08/18/2015  . Confusion 06/20/2015  . Recent urinary  tract infection 06/20/2015  . Dysuria 04/10/2015  . Edema 04/06/2015  . Acute encephalopathy 03/29/2015  . Vitamin D deficiency 03/29/2015  . UTI (lower urinary tract infection) 03/21/2015  . Hip fracture ([place]) 03/20/2015  . Chronic atrial fibrillation 02/20/2015  . Hyperlipidemia 02/20/2015  . Essential hypertension, benign 12/13/2012  . Cardiac arrhythmia 12/13/2012  . Urinary incontinence 12/13/2012  . Hypothyroidism 12/13/2012  . History of depression 12/11/2012    CMP     Component Value Date/Time   NA 144 10/13/2017   K 4.0 10/13/2017   CL 103 01/10/2016 0030   CO2 23 01/10/2016 0030   GLUCOSE 111 (H) 01/10/2016 0030   BUN 37 (A) 10/13/2017   CREATININE 1.3 (A) 10/13/2017   CREATININE 1.26 (H) 01/10/2016 0030   CALCIUM 8.6 (L) 01/10/2016 0030   PROT 6.9 12/01/2015 0506   ALBUMIN 3.6 12/01/2015 0506   AST 16 10/13/2017   ALT 9 10/13/2017  ALKPHOS 68 10/13/2017   BILITOT 0.6 12/01/2015 0506   GFRNONAA 36 (L) 01/10/2016 0030   GFRAA 41 (L) 01/10/2016 0030   Recent Labs    10/13/17  NA 144  K 4.0  BUN 37*  CREATININE 1.3*   Recent Labs    10/13/17  AST 16  ALT 9  ALKPHOS 68   Recent Labs    10/13/17  WBC 5.2  HGB 10.9*  HCT 33*  PLT 218   Recent Labs    10/13/17  CHOL 131  LDLCALC 67  TRIG 73   No results found for: [facility] Lab Results  Component Value Date   TSH 0.47 10/13/2017   Lab Results  Component Value Date   HGBA1C 5.6 10/13/2017   Lab Results  Component Value Date   CHOL 131 10/13/2017   HDL 49 10/13/2017   LDLCALC 67 10/13/2017   TRIG 73 10/13/2017    Significant Diagnostic Results in last 30 days:  No results found.  Assessment and Plan  Dementia with behavioral disturbance Chronic and stable; on no meds; continue supportive care  CKD (chronic kidney disease) stage 3, GFR 30-59 ml/min Recent creatinine 1.3 which is stable from prior, no recent GFR; continue to monitor intervals  Vitamin D deficiency Most  recent vitamin D 53; patient is off vitamin D at this time but will start 5000 units weekly    Marie Delaine. [name], MD

## 2018-02-15 ENCOUNTER — Encounter: Payer: Self-pay | Admitting: Internal Medicine

## 2018-02-15 NOTE — Assessment & Plan Note (Signed)
Recent creatinine 1.3 which is stable from prior, no recent GFR; continue to monitor intervals

## 2018-02-15 NOTE — Assessment & Plan Note (Signed)
Most recent vitamin D 53; patient is off vitamin D at this time but will start 5000 units weekly

## 2018-02-15 NOTE — Assessment & Plan Note (Signed)
Chronic and stable; on no meds; continue supportive care

## 2018-02-17 DIAGNOSIS — E039 Hypothyroidism, unspecified: Secondary | ICD-10-CM | POA: Diagnosis not present

## 2018-02-17 DIAGNOSIS — I10 Essential (primary) hypertension: Secondary | ICD-10-CM | POA: Diagnosis not present

## 2018-02-17 LAB — TSH: TSH: 0.26 — AB (ref 0.41–5.90)

## 2018-03-02 ENCOUNTER — Non-Acute Institutional Stay (SKILLED_NURSING_FACILITY): Payer: Medicare Other | Admitting: Internal Medicine

## 2018-03-02 DIAGNOSIS — E038 Other specified hypothyroidism: Secondary | ICD-10-CM

## 2018-03-03 ENCOUNTER — Encounter: Payer: Self-pay | Admitting: Internal Medicine

## 2018-03-03 DIAGNOSIS — F418 Other specified anxiety disorders: Secondary | ICD-10-CM | POA: Diagnosis not present

## 2018-03-03 DIAGNOSIS — R443 Hallucinations, unspecified: Secondary | ICD-10-CM | POA: Diagnosis not present

## 2018-03-03 DIAGNOSIS — F0390 Unspecified dementia without behavioral disturbance: Secondary | ICD-10-CM | POA: Diagnosis not present

## 2018-03-03 NOTE — Progress Notes (Signed)
:  Location:  [place] Room Number: [phone] Place of Service:  SNF (31)  Noah Delaine. [name], MD  Patient Care Team: [name], MD as PCP - General (Internal Medicine)  Extended Emergency Contact Information Primary Emergency Contact: [facility] Address: [address]          [place], [place] [address] Johnnette Litter of [place] Phone: [phone] Relation: Son Secondary Emergency Contact: [place], [place] [address] Johnnette Litter of [place] Phone: [phone] Relation: Son     Allergies: Patient has no known allergies.  Chief Complaint  Patient presents with  . Acute Visit    HPI: Patient is 85 y.o. female who is being seen for abnormal routine labs.  Patient's TSH is 0.26 and free T4 is 0.89.  Nurse denies that patient seems more fatigued than usual or more constipation than usual or change in skin, however.  Patient is 89 so it might be hard to see.  Patient has recently had poor p.o. intake.  Past Medical History:  Diagnosis Date  . Acute encephalopathy 03/29/2015  . Cardiac arrhythmia 12/13/2012   Per pt, takes atenolol, pradaxa. asymptomatic.   Marland Kitchen Chronic atrial fibrillation   . Chronic renal insufficiency, stage III (moderate) (HCC)   . CKD (chronic kidney disease) stage 3, GFR 30-59 ml/min (HCC) 09/17/2015  . Closed fracture of neck of left femur ([place])   . Confusion 06/20/2015  . Constipation   . CVA (cerebral infarction) approx 2012   residual defect: poor R sided peripheral vision per son  . Dementia with behavioral disturbance ([place]) 11/08/2015  . Depression   . Femoral neck fracture ([place]) 12/01/2015  . Hip fracture ([place]) 03/20/2015  . History of depression 12/11/2012   Pt reports 40 pound weight loss. No current Tx.   Marland Kitchen History of fracture of right hip 03/2015  . Hyperlipidemia   . Hypertension   . Hypothyroidism   . Insomnia   . Osteoarthritis    wrists, hands  . Osteoporosis   . Urinary  incontinence 12/13/2012  . Vitamin D deficiency 03/29/2015    Past Surgical History:  Procedure Laterality Date  . APPENDECTOMY    . COLONOSCOPY  08/22/08  . INTRAMEDULLARY (IM) NAIL INTERTROCHANTERIC Right 03/22/2015   Procedure: RIGHT femoral HIP NAILING;  Surgeon: Gaynelle Arabian, MD;  Location: [facility];  Service: Orthopedics;  Laterality: Right;  . KNEE ARTHROSCOPY Right   . THYROID SURGERY      Allergies as of 03/02/2018   No Known Allergies     Medication List       Accurate as of March 02, 2018 11:59 PM. Always use your most recent med list.        acetaminophen 325 MG tablet Commonly known as:  TYLENOL Take 650 mg by mouth 3 (three) times daily.   acetaminophen 500 MG tablet Commonly known as:  TYLENOL Take 500 mg by mouth every 6 (six) hours as needed for mild pain, moderate pain or headache.   atenolol 50 MG tablet Commonly known as:  TENORMIN Take 50 mg by mouth daily.   ELIQUIS 2.5 MG Tabs tablet Generic drug:  apixaban Take 2.5 mg by mouth 2 (two) times daily. 9am and 5pm   feeding supplement Liqd Take 1 Container by mouth 3 (three) times daily between meals.   NUTRITIONAL SUPPLEMENT Liqd Take by mouth. Initiate 82mL Medpass TID d/t weight loss.   levothyroxine 50  MCG tablet Commonly known as:  SYNTHROID, LEVOTHROID Take 50 mcg by mouth daily before breakfast.   ondansetron 4 MG disintegrating tablet Commonly known as:  ZOFRAN-ODT Take 4 mg by mouth every 8 (eight) hours as needed for nausea or vomiting.   QUEtiapine 25 MG tablet Commonly known as:  SEROQUEL Take 25 mg by mouth 2 (two) times daily.   senna 8.6 MG tablet Commonly known as:  SENOKOT Take 1 tablet by mouth at bedtime as needed for constipation.   tiZANidine 2 MG tablet Commonly known as:  ZANAFLEX Take 2 mg by mouth every 8 (eight) hours as needed for muscle spasms.   Vitamin D3 125 MCG (5000 UT) Tabs Take by mouth. Give 1 tablet by mouth weekly for Vitamin D def       No  orders of the defined types were placed in this encounter.   Immunization History  Administered Date(s) Administered  . Influenza Split 11/20/2012  . Influenza-Unspecified 10/07/2015, 10/18/2016  . PPD Test 08/14/2015  . Pneumococcal Conjugate-13 05/22/2015  . Pneumococcal Polysaccharide-23 08/22/2008  . Tdap 01/01/2015, 08/06/2015, 01/10/2016    Social History   Tobacco Use  . Smoking status: Never Smoker  . Smokeless tobacco: Never Used  Substance Use Topics  . Alcohol use: No    Family history is   Family History  Problem Relation Age of Onset  . Stroke Mother   . Diabetes Mother   . Alcohol abuse Father       Review of Systems   unable obtain secondary to dementia; nursing- as per history of present illness and poor p.o. intake    Vitals:   03/03/18 1353  BP: (!) 85/66  Pulse: 64  Resp: 16  Temp: 98.9 F (37.2 C)    SpO2 Readings from Last 1 Encounters:  02/13/18 97%   Body mass index is 16.27 kg/m.     Physical Exam  GENERAL APPEARANCE: Alert, conversant,  No acute distress.  SKIN: No diaphoresis rash HEAD: Normocephalic, atraumatic  EYES: Conjunctiva/lids clear. Pupils round, reactive. EOMs intact.  EARS: External exam WNL, canals clear. Hearing grossly normal.  NOSE: No deformity or discharge.  MOUTH/THROAT: Lips w/o lesions  RESPIRATORY: Breathing is even, unlabored. Lung sounds are clear   CARDIOVASCULAR: Heart RRR no murmurs, rubs or gallops. No peripheral edema.   GASTROINTESTINAL: Abdomen is soft, non-tender, not distended w/ normal bowel sounds. GENITOURINARY: Bladder non tender, not distended  MUSCULOSKELETAL: No abnormal joints or musculature NEUROLOGIC:  Cranial nerves 2-12 grossly intact. Moves all extremities  PSYCHIATRIC: Dementia, occasional behavioral issues  Patient Active Problem List   Diagnosis Date Noted  . Failure to thrive in adult 07/14/2016  . Femoral neck fracture ([place]) 12/01/2015  . Malnutrition of moderate  degree 12/01/2015  . Closed fracture of neck of left femur ([place])   . Mood disorder ([place]) 11/19/2015  . Dementia with behavioral disturbance ([place]) 11/08/2015  . CKD (chronic kidney disease) stage 3, GFR 30-59 ml/min (HCC) 09/17/2015  . Insomnia 08/18/2015  . Confusion 06/20/2015  . Recent urinary tract infection 06/20/2015  . Dysuria 04/10/2015  . Edema 04/06/2015  . Acute encephalopathy 03/29/2015  . Vitamin D deficiency 03/29/2015  . UTI (lower urinary tract infection) 03/21/2015  . Hip fracture ([place]) 03/20/2015  . Chronic atrial fibrillation 02/20/2015  . Hyperlipidemia 02/20/2015  . Essential hypertension, benign 12/13/2012  . Cardiac arrhythmia 12/13/2012  . Urinary incontinence 12/13/2012  . Hypothyroidism 12/13/2012  . History of depression 12/11/2012      Labs  reviewed: Basic Metabolic Panel:    Component Value Date/Time   NA 144 10/13/2017   K 4.0 10/13/2017   CL 103 01/10/2016 0030   CO2 23 01/10/2016 0030   GLUCOSE 111 (H) 01/10/2016 0030   BUN 37 (A) 10/13/2017   CREATININE 1.3 (A) 10/13/2017   CREATININE 1.26 (H) 01/10/2016 0030   CALCIUM 8.6 (L) 01/10/2016 0030   PROT 6.9 12/01/2015 0506   ALBUMIN 3.6 12/01/2015 0506   AST 16 10/13/2017   ALT 9 10/13/2017   ALKPHOS 68 10/13/2017   BILITOT 0.6 12/01/2015 0506   GFRNONAA 36 (L) 01/10/2016 0030   GFRAA 41 (L) 01/10/2016 0030    Recent Labs    10/13/17  NA 144  K 4.0  BUN 37*  CREATININE 1.3*   Liver Function Tests: Recent Labs    10/13/17  AST 16  ALT 9  ALKPHOS 68   No results for input(s): LIPASE, AMYLASE in the last 8760 hours. No results for input(s): AMMONIA in the last 8760 hours. CBC: Recent Labs    10/13/17  WBC 5.2  HGB 10.9*  HCT 33*  PLT 218   Lipid Recent Labs    10/13/17  CHOL 131  HDL 49  LDLCALC 67  TRIG 73    Cardiac Enzymes: No results for input(s): CKTOTAL, CKMB, CKMBINDEX, TROPONINI in the last 8760 hours. BNP: No results for input(s): BNP in the last  8760 hours. No results found for: [facility] Lab Results  Component Value Date   HGBA1C 5.6 10/13/2017   Lab Results  Component Value Date   TSH 0.26 (A) 02/17/2018   Lab Results  Component Value Date   VITAMINB12 5.1 12/03/2016   Lab Results  Component Value Date   FOLATE 13.9 03/21/2015   Lab Results  Component Value Date   IRON 15 (L) 03/21/2015   TIBC 319 03/21/2015   FERRITIN 31 03/21/2015    Imaging and Procedures obtained prior to SNF admission: Dg Op Swallowing Func-medicare/speech Path  Result Date: 04/18/2016 Objective Swallowing Evaluation: Type of Study: MBS-Modified Barium Swallow Study Patient Details Name: Leenah Seidner MRN: [id number] Date of Birth: 1920/04/28 Today's Date: 04/18/2016 Time: SLP Start Time (ACUTE ONLY): 1110-SLP Stop Time (ACUTE ONLY): 1144 SLP Time Calculation (min) (ACUTE ONLY): 34 min Past Medical History: Past Medical History: Diagnosis Date . Chronic atrial fibrillation ([place])  . Chronic renal insufficiency, stage III (moderate)  . Constipation  . CVA (cerebral infarction) approx 2012  residual defect: poor R sided peripheral vision per son . Depression  . History of fracture of right hip 03/2015 . Hyperlipidemia  . Hypertension  . Hypothyroidism  . Insomnia  . Osteoarthritis   wrists, hands . Osteoporosis  Past Surgical History: Past Surgical History: Procedure Laterality Date . APPENDECTOMY   . COLONOSCOPY  08/22/08 . INTRAMEDULLARY (IM) NAIL INTERTROCHANTERIC Right 03/22/2015  Procedure: RIGHT femoral HIP NAILING;  Surgeon: Gaynelle Arabian, MD;  [facility];  Service: Orthopedics;  Laterality: Right; . KNEE ARTHROSCOPY Right  . THYROID SURGERY   HPI: Patient is a 85 y.o female who presents for outpatient MBS from [facility] PMH significant for chronic afib, renal insufficiency (stage III), CVA, depression, hx of right hip fracture, HTN, HLD and mood disorder. No Data Recorded Assessment / Plan / Recommendation CHL IP CLINICAL  IMPRESSIONS 04/18/2016 Clinical Impression Pt demonstrates a mild oral dysphagia with residuals on the base of tongue that pt generally transits with a second swallow, though not always immediately or consistently, with  spill to the valleculae with all textures. Oropharyngeal dysphagia characterized by frank  silent penetration during the swallow with thin liquids only (pt did not penetrate nectar, had accumulation of penetrate prior to nectar being given). Impairment related to slight delay in laryngeal elevation and closure of laryngeal vestibule as pt transits bolus. A chin tuck was not effective in preventing penetration. Mobility and strength of musculature generally WFL though not all of penetrate is ejected  during the swallow and it accumulated over several swallows. A cued throat clear and second swallow ejected the penetrate effectively. Esophageal sweep with puree and barium tablet showed no significant stasis, though esopahgus did appear torturous. Defer diet recommendation and appropriate strategies to primary SLP.  SLP Visit Diagnosis Dysphagia, oropharyngeal phase (R13.12) Attention and concentration deficit following -- Frontal lobe and executive function deficit following -- Impact on safety and function Mild aspiration risk   CHL IP TREATMENT RECOMMENDATION 04/18/2016 Treatment Recommendations Defer treatment plan to f/u with SLP   No flowsheet data found. CHL IP DIET RECOMMENDATION 04/18/2016 SLP Diet Recommendations (No Data) Liquid Administration via -- Medication Administration Whole meds with puree Compensations -- Postural Changes --   CHL IP OTHER RECOMMENDATIONS 04/18/2016 Recommended Consults -- Oral Care Recommendations Oral care BID Other Recommendations --   No flowsheet data found.  No flowsheet data found.     CHL IP ORAL PHASE 04/18/2016 Oral Phase Impaired Oral - Pudding Teaspoon -- Oral - Pudding Cup -- Oral - Honey Teaspoon -- Oral - Honey Cup -- Oral - Nectar Teaspoon -- Oral - Nectar  Cup Decreased bolus cohesion;Lingual/palatal residue Oral - Nectar Straw Decreased bolus cohesion;Lingual/palatal residue Oral - Thin Teaspoon -- Oral - Thin Cup Decreased bolus cohesion;Lingual/palatal residue Oral - Thin Straw Decreased bolus cohesion;Lingual/palatal residue Oral - Puree Decreased bolus cohesion;Lingual/palatal residue Oral - Mech Soft -- Oral - Regular Decreased bolus cohesion;Lingual/palatal residue Oral - Multi-Consistency -- Oral - Pill Decreased bolus cohesion;Lingual/palatal residue Oral Phase - Comment --  CHL IP PHARYNGEAL PHASE 04/18/2016 Pharyngeal Phase Impaired Pharyngeal- Pudding Teaspoon -- Pharyngeal -- Pharyngeal- Pudding Cup -- Pharyngeal -- Pharyngeal- Honey Teaspoon -- Pharyngeal -- Pharyngeal- Honey Cup -- Pharyngeal -- Pharyngeal- Nectar Teaspoon -- Pharyngeal -- Pharyngeal- Nectar Cup WFL Pharyngeal -- Pharyngeal- Nectar Straw WFL Pharyngeal -- Pharyngeal- Thin Teaspoon -- Pharyngeal -- Pharyngeal- Thin Cup Penetration/Aspiration before swallow;Penetration/Aspiration during swallow Pharyngeal Material enters airway, CONTACTS cords and not ejected out;Material enters airway, remains ABOVE vocal cords and not ejected out Pharyngeal- Thin Straw Penetration/Aspiration before swallow;Penetration/Aspiration during swallow Pharyngeal Material enters airway, CONTACTS cords and not ejected out;Material enters airway, remains ABOVE vocal cords and not ejected out Pharyngeal- Puree WFL Pharyngeal -- Pharyngeal- Mechanical Soft -- Pharyngeal -- Pharyngeal- Regular WFL Pharyngeal -- Pharyngeal- Multi-consistency -- Pharyngeal -- Pharyngeal- Pill WFL;Other (Comment) Pharyngeal -- Pharyngeal Comment --  CHL IP CERVICAL ESOPHAGEAL PHASE 04/18/2016 Cervical Esophageal Phase WFL Pudding Teaspoon -- Pudding Cup -- Honey Teaspoon -- Honey Cup -- Nectar Teaspoon -- Nectar Cup -- Nectar Straw -- Thin Teaspoon -- Thin Cup -- Thin Straw -- Puree -- Mechanical Soft -- Regular -- Multi-consistency --  Pill -- Cervical Esophageal Comment -- CHL IP GO 04/18/2016 Functional Assessment Tool Used clinical judgement Functional Limitations Swallowing Swallow Current Status (K8127) CJ Swallow Goal Status (N1700) [place] Swallow Discharge Status (F7494) CJ Motor Speech Current Status (W9675) (None) Motor Speech Goal Status (F1638) (None) Motor Speech Goal Status (G6659) (None) Spoken Language Comprehension Current Status (D3570) (None) Spoken Language Comprehension Goal Status (V7793) (None) Spoken  Language Comprehension Discharge Status [phone]) (None) Spoken Language Expression Current Status [phone]) (None) Spoken Language Expression Goal Status [phone]) (None) Spoken Language Expression Discharge Status [phone]) (None) Attention Current Status [phone]) (None) Attention Goal Status (V0350) (None) Attention Discharge Status [phone]) (None) Memory Current Status (W2993) (None) Memory Goal Status (Z1696) (None) Memory Discharge Status (V8938) (None) Voice Current Status (B0175) (None) Voice Goal Status (Z0258) (None) Voice Discharge Status (N2778) (None) Other Speech-Language Pathology Functional Limitation Current Status (E4235) (None) Other Speech-Language Pathology Functional Limitation Goal Status (T6144) (None) Other Speech-Language Pathology Functional Limitation Discharge Status [phone]) (None) DeBlois, Katherene Ponto 04/18/2016, 12:28 PM            CLINICAL DATA:  Stroke, dysphagia.  Dementia. EXAM: MODIFIED BARIUM SWALLOW TECHNIQUE: Different consistencies of barium were administered orally to the patient by the Speech Pathologist. Imaging of the pharynx was performed in the lateral projection. FLUOROSCOPY TIME:  Fluoroscopy Time:  3 minutes, 14 seconds Radiation Exposure Index (if provided by the fluoroscopic device): Number of Acquired Spot Images: 0 COMPARISON:  None. FINDINGS: Thin liquid- penetration to the cords multiple episodes, with and without chin-tuck. Mild retention. Nectar thick liquid- penetration on 1 out of 3  swallows. Mild retention. Pure- retention on 1 of 3 swallows. Pure with cracker- within normal limits Barium tablet -  within normal limits IMPRESSION: 1. Silent penetration to the level of the cords with thin liquids and rarely with nectar thick liquid. Mild vallecular retention. Please refer to the Speech Pathologists report for complete details and recommendations. Electronically Signed   By: Van Clines M.D.   On: 04/18/2016 12:07     Not all labs, radiology exams or other studies done during hospitalization come through on my EPIC note; however they are reviewed by me.   ` Assessment and Plan  Secondary hypothyroidism- both TSH and free T4 are borderline low; will start Synthroid at 50 mcg daily; will also draw ACTH level and cortisol level.  Repeat TSH and T4 levels in 6 weeks    Noah Delaine. [name], MD

## 2018-03-05 DIAGNOSIS — R627 Adult failure to thrive: Secondary | ICD-10-CM | POA: Diagnosis not present

## 2018-03-16 ENCOUNTER — Encounter: Payer: Self-pay | Admitting: Internal Medicine

## 2018-03-16 ENCOUNTER — Non-Acute Institutional Stay (SKILLED_NURSING_FACILITY): Payer: Medicare Other | Admitting: Internal Medicine

## 2018-03-16 DIAGNOSIS — F39 Unspecified mood [affective] disorder: Secondary | ICD-10-CM | POA: Diagnosis not present

## 2018-03-16 DIAGNOSIS — E785 Hyperlipidemia, unspecified: Secondary | ICD-10-CM

## 2018-03-16 DIAGNOSIS — E034 Atrophy of thyroid (acquired): Secondary | ICD-10-CM

## 2018-03-16 NOTE — Progress Notes (Signed)
Location:  [place] Room Number: [phone] Place of Service:  SNF [phone])  Noah Delaine. [name], MD  Patient Care Team: [name], MD as PCP - General (Internal Medicine)  Extended Emergency Contact Information Primary Emergency Contact: [facility] Address: [address]          [place], [place] [address] Johnnette Litter of [place] Phone: [phone] Relation: Son Secondary Emergency Contact: [place], [place] [address] Johnnette Litter of [place] Phone: [phone] Relation: Son    Allergies: Patient has no known allergies.  Chief Complaint  Patient presents with  . Medical Management of Chronic Issues    Routine visit    HPI: Patient is 85 y.o. female who is being seen for routine issues of hypothyroidism, hyperlipidemia, and mood disorder.  Past Medical History:  Diagnosis Date  . Acute encephalopathy 03/29/2015  . Cardiac arrhythmia 12/13/2012   Per pt, takes atenolol, pradaxa. asymptomatic.   Marland Kitchen Chronic atrial fibrillation   . Chronic renal insufficiency, stage III (moderate) (HCC)   . CKD (chronic kidney disease) stage 3, GFR 30-59 ml/min (HCC) 09/17/2015  . Closed fracture of neck of left femur ([place])   . Confusion 06/20/2015  . Constipation   . CVA (cerebral infarction) approx 2012   residual defect: poor R sided peripheral vision per son  . Dementia with behavioral disturbance ([place]) 11/08/2015  . Depression   . Femoral neck fracture ([place]) 12/01/2015  . Hip fracture ([place]) 03/20/2015  . History of depression 12/11/2012   Pt reports 40 pound weight loss. No current Tx.   Marland Kitchen History of fracture of right hip 03/2015  . Hyperlipidemia   . Hypertension   . Hypothyroidism   . Insomnia   . Osteoarthritis    wrists, hands  . Osteoporosis   . Urinary incontinence 12/13/2012  . Vitamin D deficiency 03/29/2015    Past Surgical History:  Procedure Laterality Date  . APPENDECTOMY    . COLONOSCOPY  08/22/08  .  INTRAMEDULLARY (IM) NAIL INTERTROCHANTERIC Right 03/22/2015   Procedure: RIGHT femoral HIP NAILING;  Surgeon: Gaynelle Arabian, MD;  [facility];  Service: Orthopedics;  Laterality: Right;  . KNEE ARTHROSCOPY Right   . THYROID SURGERY      Allergies as of 03/16/2018   No Known Allergies     Medication List       Accurate as of March 16, 2018 11:59 PM. Always use your most recent med list.        acetaminophen 325 MG tablet Commonly known as:  TYLENOL Take 650 mg by mouth 3 (three) times daily.   acetaminophen 500 MG tablet Commonly known as:  TYLENOL Take 500 mg by mouth every 6 (six) hours as needed for mild pain, moderate pain or headache.   atenolol 50 MG tablet Commonly known as:  TENORMIN Take 50 mg by mouth daily.   Eliquis 2.5 MG Tabs tablet Generic drug:  apixaban Take 2.5 mg by mouth 2 (two) times daily. 9am and 5pm   feeding supplement Liqd Take 1 Container by mouth 3 (three) times daily between meals.   Nutritional Supplement Liqd Take by mouth. Initiate 51mL Medpass TID d/t weight loss.   levothyroxine 50 MCG tablet Commonly known as:  SYNTHROID, LEVOTHROID Take 50 mcg by mouth daily before breakfast.   ondansetron 4 MG disintegrating tablet Commonly known as:  ZOFRAN-ODT Take 4 mg by mouth every 8 (eight) hours as  needed for nausea or vomiting.   QUEtiapine 25 MG tablet Commonly known as:  SEROQUEL Take 25 mg by mouth 2 (two) times daily.   senna 8.6 MG tablet Commonly known as:  SENOKOT Take 1 tablet by mouth at bedtime as needed for constipation.   tiZANidine 2 MG tablet Commonly known as:  ZANAFLEX Take 2 mg by mouth every 8 (eight) hours as needed for muscle spasms.   Vitamin D3 125 MCG (5000 UT) Tabs Take by mouth. Give 1 tablet by mouth weekly for Vitamin D def       No orders of the defined types were placed in this encounter.   Immunization History  Administered Date(s) Administered  . Influenza Split 11/20/2012  .  Influenza-Unspecified 10/07/2015, 10/18/2016  . PPD Test 08/14/2015  . Pneumococcal Conjugate-13 05/22/2015  . Pneumococcal Polysaccharide-23 08/22/2008  . Tdap 01/01/2015, 08/06/2015, 01/10/2016    Social History   Tobacco Use  . Smoking status: Never Smoker  . Smokeless tobacco: Never Used  Substance Use Topics  . Alcohol use: No    Review of Systems unable to obtain secondary to dementia; nursing- no new acute concerns     Vitals:   03/16/18 1136  BP: 96/72  Pulse: 68  Resp: 17  Temp: 97.8 F (36.6 C)   Body mass index is 16.27 kg/m. Physical Exam  GENERAL APPEARANCE: Alert, conversant, No acute distress  SKIN: No diaphoresis rash HEENT: Unremarkable RESPIRATORY: Breathing is even, unlabored. Lung sounds are clear   CARDIOVASCULAR: Heart RRR no murmurs, rubs or gallops. No peripheral edema  GASTROINTESTINAL: Abdomen is soft, non-tender, not distended w/ normal bowel sounds.  GENITOURINARY: Bladder non tender, not distended  MUSCULOSKELETAL: No abnormal joints or musculature NEUROLOGIC: Cranial nerves 2-12 grossly intact. Moves all extremities PSYCHIATRIC: The only confused, no behavioral issues  Patient Active Problem List   Diagnosis Date Noted  . Failure to thrive in adult 07/14/2016  . Femoral neck fracture ([place]) 12/01/2015  . Malnutrition of moderate degree 12/01/2015  . Closed fracture of neck of left femur ([place])   . Mood disorder ([place]) 11/19/2015  . Dementia with behavioral disturbance ([place]) 11/08/2015  . CKD (chronic kidney disease) stage 3, GFR 30-59 ml/min (HCC) 09/17/2015  . Insomnia 08/18/2015  . Confusion 06/20/2015  . Recent urinary tract infection 06/20/2015  . Dysuria 04/10/2015  . Edema 04/06/2015  . Acute encephalopathy 03/29/2015  . Vitamin D deficiency 03/29/2015  . UTI (lower urinary tract infection) 03/21/2015  . Hip fracture ([place]) 03/20/2015  . Chronic atrial fibrillation 02/20/2015  . Hyperlipidemia 02/20/2015  . Essential  hypertension, benign 12/13/2012  . Cardiac arrhythmia 12/13/2012  . Urinary incontinence 12/13/2012  . Hypothyroidism 12/13/2012  . History of depression 12/11/2012    CMP     Component Value Date/Time   NA 144 10/13/2017   K 4.0 10/13/2017   CL 103 01/10/2016 0030   CO2 23 01/10/2016 0030   GLUCOSE 111 (H) 01/10/2016 0030   BUN 37 (A) 10/13/2017   CREATININE 1.3 (A) 10/13/2017   CREATININE 1.26 (H) 01/10/2016 0030   CALCIUM 8.6 (L) 01/10/2016 0030   PROT 6.9 12/01/2015 0506   ALBUMIN 3.6 12/01/2015 0506   AST 16 10/13/2017   ALT 9 10/13/2017   ALKPHOS 68 10/13/2017   BILITOT 0.6 12/01/2015 0506   GFRNONAA 36 (L) 01/10/2016 0030   GFRAA 41 (L) 01/10/2016 0030   Recent Labs    10/13/17  NA 144  K 4.0  BUN 37*  CREATININE 1.3*  Recent Labs    10/13/17  AST 16  ALT 9  ALKPHOS 68   Recent Labs    10/13/17  WBC 5.2  HGB 10.9*  HCT 33*  PLT 218   Recent Labs    10/13/17  CHOL 131  LDLCALC 67  TRIG 73   No results found for: [facility] Lab Results  Component Value Date   TSH 0.26 (A) 02/17/2018   Lab Results  Component Value Date   HGBA1C 5.6 10/13/2017   Lab Results  Component Value Date   CHOL 131 10/13/2017   HDL 49 10/13/2017   LDLCALC 67 10/13/2017   TRIG 73 10/13/2017    Significant Diagnostic Results in last 30 days:  No results found.  Assessment and Plan  Hypothyroidism Patient has not been taken off Synthroid but we can try this again and recheck TSH in 4 weeks; TSH taken recently was 0.25 which is way too low  Hyperlipidemia Lipids are controlled on no medications; patient does not need medications and she is too old may: There is really no need to check lipids again  Mood disorder ([place]) Controlled; patient does not act afraid or act like she is hallucinating, she is pleasantly demented    Noah Delaine. [name], MD

## 2018-03-17 ENCOUNTER — Encounter: Payer: Self-pay | Admitting: Internal Medicine

## 2018-03-17 NOTE — Assessment & Plan Note (Signed)
Lipids are controlled on no medications; patient does not need medications and she is too old may: There is really no need to check lipids again

## 2018-03-17 NOTE — Assessment & Plan Note (Signed)
Patient has not been taken off Synthroid but we can try this again and recheck TSH in 4 weeks; TSH taken recently was 0.25 which is way too low

## 2018-03-17 NOTE — Assessment & Plan Note (Signed)
Controlled; patient does not act afraid or act like she is hallucinating, she is pleasantly demented

## 2018-04-08 DIAGNOSIS — E039 Hypothyroidism, unspecified: Secondary | ICD-10-CM | POA: Diagnosis not present

## 2018-04-08 LAB — TSH: TSH: 0.32 — AB (ref 0.41–5.90)

## 2018-04-10 ENCOUNTER — Non-Acute Institutional Stay (SKILLED_NURSING_FACILITY): Payer: Medicare Other | Admitting: Adult Health

## 2018-04-10 ENCOUNTER — Encounter: Payer: Self-pay | Admitting: Adult Health

## 2018-04-10 DIAGNOSIS — F0281 Dementia in other diseases classified elsewhere with behavioral disturbance: Secondary | ICD-10-CM | POA: Diagnosis not present

## 2018-04-10 DIAGNOSIS — I482 Chronic atrial fibrillation, unspecified: Secondary | ICD-10-CM

## 2018-04-10 DIAGNOSIS — S72002S Fracture of unspecified part of neck of left femur, sequela: Secondary | ICD-10-CM | POA: Diagnosis not present

## 2018-04-10 DIAGNOSIS — N183 Chronic kidney disease, stage 3 unspecified: Secondary | ICD-10-CM

## 2018-04-10 DIAGNOSIS — G301 Alzheimer's disease with late onset: Secondary | ICD-10-CM

## 2018-04-10 DIAGNOSIS — R627 Adult failure to thrive: Secondary | ICD-10-CM

## 2018-04-10 DIAGNOSIS — F39 Unspecified mood [affective] disorder: Secondary | ICD-10-CM

## 2018-04-10 DIAGNOSIS — I10 Essential (primary) hypertension: Secondary | ICD-10-CM | POA: Diagnosis not present

## 2018-04-10 DIAGNOSIS — E034 Atrophy of thyroid (acquired): Secondary | ICD-10-CM | POA: Diagnosis not present

## 2018-04-10 DIAGNOSIS — F02818 Dementia in other diseases classified elsewhere, unspecified severity, with other behavioral disturbance: Secondary | ICD-10-CM

## 2018-04-10 NOTE — Progress Notes (Signed)
Location:   [facility] Room Number: [phone] Place of Service:  SNF (31)   CODE STATUS: DNR  No Known Allergies  Chief Complaint  Patient presents with  . Medical Management of Chronic Issues    Essential hypertension benign; chronic atrial fibrillation; hypothyroidism due to acquired atrophy of thyroid.     HPI:  She is a 85 year old long term resident of this facility being seen for the management of her chronic illnesses: hypertension; afib; hypothyroidism. Her synthroid has been stopped; her tsh is 0.32 with a normal free T4 1.04. there are no reports of changes in appetite; no uncontrolled pain; no agitation. There are no reports of fevers present.   Past Medical History:  Diagnosis Date  . Acute encephalopathy 03/29/2015  . Cardiac arrhythmia 12/13/2012   Per pt, takes atenolol, pradaxa. asymptomatic.   Marland Kitchen Chronic atrial fibrillation   . Chronic renal insufficiency, stage III (moderate) (HCC)   . CKD (chronic kidney disease) stage 3, GFR 30-59 ml/min (HCC) 09/17/2015  . Closed fracture of neck of left femur ([place])   . Confusion 06/20/2015  . Constipation   . CVA (cerebral infarction) approx 2012   residual defect: poor R sided peripheral vision per son  . Dementia with behavioral disturbance ([place]) 11/08/2015  . Depression   . Femoral neck fracture ([place]) 12/01/2015  . Hip fracture ([place]) 03/20/2015  . History of depression 12/11/2012   Pt reports 40 pound weight loss. No current Tx.   Marland Kitchen History of fracture of right hip 03/2015  . Hyperlipidemia   . Hypertension   . Hypothyroidism   . Insomnia   . Osteoarthritis    wrists, hands  . Osteoporosis   . Urinary incontinence 12/13/2012  . Vitamin D deficiency 03/29/2015    Past Surgical History:  Procedure Laterality Date  . APPENDECTOMY    . COLONOSCOPY  08/22/08  . INTRAMEDULLARY (IM) NAIL INTERTROCHANTERIC Right 03/22/2015   Procedure: RIGHT femoral HIP NAILING;  Surgeon: Gaynelle Arabian, MD;   Location: [facility];  Service: Orthopedics;  Laterality: Right;  . KNEE ARTHROSCOPY Right   . THYROID SURGERY      Social History   Socioeconomic History  . Marital status: Widowed    Spouse name: Not on file  . Number of children: Not on file  . Years of education: Not on file  . Highest education level: Not on file  Occupational History  . Occupation: retired Corporate treasurer  Social Needs  . Financial resource strain: Not hard at all  . Food insecurity:    Worry: Never true    Inability: Never true  . Transportation needs:    Medical: No    Non-medical: No  Tobacco Use  . Smoking status: Never Smoker  . Smokeless tobacco: Never Used  Substance and Sexual Activity  . Alcohol use: No  . Drug use: No  . Sexual activity: Never  Lifestyle  . Physical activity:    Days per week: 0 days    Minutes per session: 0 min  . Stress: Not at all  Relationships  . Social connections:    Talks on phone: Once a week    Gets together: Once a week    Attends religious service: Never    Active member of club or organization: No    Attends meetings of clubs or organizations: Never    Relationship status: Widowed  . Intimate partner violence:    Fear of current or ex partner:  No    Emotionally abused: No    Physically abused: No    Forced sexual activity: No  Other Topics Concern  . Not on file  Social History Narrative   Widow.   Educ: 11th grade.   Occupation: retired Insurance claims handler and retired Secretary/administrator.   As of 02/2015, pt is in independent living at [facility] in [place].   No T/A/Ds.   Admitted to [facility] 08/14/15   Never smoked   Alcohol none   DNR   Family History  Problem Relation Age of Onset  . Stroke Mother   . Diabetes Mother   . Alcohol abuse Father       VITAL SIGNS BP (!) 98/57   Pulse 63   Temp 98.1 F (36.7 C) (Oral)   Resp 18   Ht 5\' 4"  (1.626 m)   Wt 100 lb 9.6 oz (45.6 kg)   LMP 01/08/1972 (LMP Unknown)   SpO2 98%   BMI 17.27  kg/m   Outpatient Encounter Medications as of 04/10/2018  Medication Sig  . acetaminophen (TYLENOL) 325 MG tablet Take 650 mg by mouth 3 (three) times daily.  Marland Kitchen acetaminophen (TYLENOL) 500 MG tablet Take 500 mg by mouth every 6 (six) hours as needed for mild pain, moderate pain or headache.  Marland Kitchen apixaban (ELIQUIS) 2.5 MG TABS tablet Take 2.5 mg by mouth 2 (two) times daily. 9am and 5pm  . atenolol (TENORMIN) 50 MG tablet Take 50 mg by mouth daily.  . Cholecalciferol (VITAMIN D3) 125 MCG (5000 UT) TABS Take by mouth. Give 1 tablet by mouth weekly for Vitamin D def  . feeding supplement (BOOST / RESOURCE BREEZE) LIQD Take 1 Container by mouth 3 (three) times daily between meals.   . NUTRITIONAL SUPPLEMENT LIQD Take by mouth. Initiate 39mL Medpass TID d/t weight loss.  . ondansetron (ZOFRAN-ODT) 4 MG disintegrating tablet Take 4 mg by mouth every 8 (eight) hours as needed for nausea or vomiting.  . senna (SENOKOT) 8.6 MG tablet Take 1 tablet by mouth at bedtime as needed for constipation.   Marland Kitchen tiZANidine (ZANAFLEX) 2 MG tablet Take 2 mg by mouth every 8 (eight) hours as needed for muscle spasms.   . [DISCONTINUED] levothyroxine (SYNTHROID, LEVOTHROID) 50 MCG tablet Take 50 mcg by mouth daily before breakfast.   . [DISCONTINUED] QUEtiapine (SEROQUEL) 25 MG tablet Take 25 mg by mouth 2 (two) times daily.    No facility-administered encounter medications on file as of 04/10/2018.      SIGNIFICANT DIAGNOSTIC EXAMS   LABS REVIEWED TODAY:   10-13-17:wbc 5.2; hgb 10.9; hct 33; plt 218 glucose 85; bun 37; creat 1.3; k+ 4.0; na++ 144 liver normal chol 131; ldl 67; trig 73; hdl 49 hgb a1c 5.6; tsh 0.47 01-30-18: vit D 53.07 02-17-18: tsh 0.26 04-08-18: tsh 0.32 free T 4: 1.04   Review of Systems  Unable to perform ROS: Dementia (unable to participate  )    Physical Exam Constitutional:      General: She is not in acute distress.    Appearance: She is underweight. She is not diaphoretic.  Neck:      Musculoskeletal: Neck supple.     Thyroid: No thyromegaly.  Cardiovascular:     Rate and Rhythm: Normal rate and regular rhythm.     Pulses: Normal pulses.     Heart sounds: Normal heart sounds.  Pulmonary:     Effort: Pulmonary effort is normal. No respiratory distress.  Breath sounds: Normal breath sounds.  Abdominal:     General: Bowel sounds are normal. There is no distension.     Palpations: Abdomen is soft.     Tenderness: There is no abdominal tenderness.  Musculoskeletal:     Right lower leg: No edema.     Left lower leg: No edema.     Comments: Is able to move all extremities   Lymphadenopathy:     Cervical: No cervical adenopathy.  Skin:    General: Skin is warm and dry.  Neurological:     Mental Status: She is alert. Mental status is at baseline.  Psychiatric:        Mood and Affect: Mood normal.        ASSESSMENT/ PLAN:  TODAY:   1. Essential hypertension; benign: is stable b/p 98/57: will continue atenolol 50 mg daily   2. Chronic atrial fibrillation: heart rate is stable will continue eliquis 2.5 mg twice daily and atenolol 50 mg daily for rate control   3. hypothyroidism due to acquired atrophy of thyroid: is stable tsh is 0.32 free T4: 1.04; is compensated  will monitor   4. Late onset alzheimer's disease with behavioral disturbance: is without change weight is 100 pounds will not make changes will monitor   5. CKD (chronic kidney disease) stage 3 GFR 30-59: is stable bun 37; creat 1.3 will monitor  6. Mood disorder: is stable is presently off seroquel will monitor   7. Failure to thrive in adult: is without change weight is 100 pounds; will continue supplements as directed  8. Closed fracture of neck of left femur: 2017: is on tylenol 650 mg three times daily for pain management.      MD is aware of resident's narcotic use and is in agreement with current plan of care. We will attempt to wean resident as apropriate   Ok Edwards NP  [facility]  Contact [phone] Monday through Friday 8am- 5pm  After hours call [phone]

## 2018-04-13 DIAGNOSIS — E039 Hypothyroidism, unspecified: Secondary | ICD-10-CM | POA: Diagnosis not present

## 2018-04-13 DIAGNOSIS — I129 Hypertensive chronic kidney disease with stage 1 through stage 4 chronic kidney disease, or unspecified chronic kidney disease: Secondary | ICD-10-CM | POA: Diagnosis not present

## 2018-04-21 DIAGNOSIS — R1312 Dysphagia, oropharyngeal phase: Secondary | ICD-10-CM | POA: Diagnosis not present

## 2018-04-21 DIAGNOSIS — M6281 Muscle weakness (generalized): Secondary | ICD-10-CM | POA: Diagnosis not present

## 2018-04-21 DIAGNOSIS — I6910 Unspecified sequelae of nontraumatic intracerebral hemorrhage: Secondary | ICD-10-CM | POA: Diagnosis not present

## 2018-04-21 DIAGNOSIS — R2681 Unsteadiness on feet: Secondary | ICD-10-CM | POA: Diagnosis not present

## 2018-04-22 DIAGNOSIS — R2681 Unsteadiness on feet: Secondary | ICD-10-CM | POA: Diagnosis not present

## 2018-04-22 DIAGNOSIS — M6281 Muscle weakness (generalized): Secondary | ICD-10-CM | POA: Diagnosis not present

## 2018-04-22 DIAGNOSIS — I6910 Unspecified sequelae of nontraumatic intracerebral hemorrhage: Secondary | ICD-10-CM | POA: Diagnosis not present

## 2018-04-22 DIAGNOSIS — R1312 Dysphagia, oropharyngeal phase: Secondary | ICD-10-CM | POA: Diagnosis not present

## 2018-04-23 DIAGNOSIS — R2681 Unsteadiness on feet: Secondary | ICD-10-CM | POA: Diagnosis not present

## 2018-04-23 DIAGNOSIS — M6281 Muscle weakness (generalized): Secondary | ICD-10-CM | POA: Diagnosis not present

## 2018-04-23 DIAGNOSIS — I6910 Unspecified sequelae of nontraumatic intracerebral hemorrhage: Secondary | ICD-10-CM | POA: Diagnosis not present

## 2018-04-23 DIAGNOSIS — R1312 Dysphagia, oropharyngeal phase: Secondary | ICD-10-CM | POA: Diagnosis not present

## 2018-04-24 DIAGNOSIS — R2681 Unsteadiness on feet: Secondary | ICD-10-CM | POA: Diagnosis not present

## 2018-04-24 DIAGNOSIS — M6281 Muscle weakness (generalized): Secondary | ICD-10-CM | POA: Diagnosis not present

## 2018-04-24 DIAGNOSIS — R1312 Dysphagia, oropharyngeal phase: Secondary | ICD-10-CM | POA: Diagnosis not present

## 2018-04-24 DIAGNOSIS — I6910 Unspecified sequelae of nontraumatic intracerebral hemorrhage: Secondary | ICD-10-CM | POA: Diagnosis not present

## 2018-04-27 DIAGNOSIS — I6910 Unspecified sequelae of nontraumatic intracerebral hemorrhage: Secondary | ICD-10-CM | POA: Diagnosis not present

## 2018-04-27 DIAGNOSIS — M6281 Muscle weakness (generalized): Secondary | ICD-10-CM | POA: Diagnosis not present

## 2018-04-27 DIAGNOSIS — R2681 Unsteadiness on feet: Secondary | ICD-10-CM | POA: Diagnosis not present

## 2018-04-27 DIAGNOSIS — R1312 Dysphagia, oropharyngeal phase: Secondary | ICD-10-CM | POA: Diagnosis not present

## 2018-04-28 DIAGNOSIS — M6281 Muscle weakness (generalized): Secondary | ICD-10-CM | POA: Diagnosis not present

## 2018-04-28 DIAGNOSIS — I6910 Unspecified sequelae of nontraumatic intracerebral hemorrhage: Secondary | ICD-10-CM | POA: Diagnosis not present

## 2018-04-28 DIAGNOSIS — R1312 Dysphagia, oropharyngeal phase: Secondary | ICD-10-CM | POA: Diagnosis not present

## 2018-04-28 DIAGNOSIS — R2681 Unsteadiness on feet: Secondary | ICD-10-CM | POA: Diagnosis not present

## 2018-04-29 ENCOUNTER — Non-Acute Institutional Stay (SKILLED_NURSING_FACILITY): Payer: Medicare Other | Admitting: Internal Medicine

## 2018-04-29 DIAGNOSIS — B373 Candidiasis of vulva and vagina: Secondary | ICD-10-CM | POA: Diagnosis not present

## 2018-04-29 DIAGNOSIS — I6910 Unspecified sequelae of nontraumatic intracerebral hemorrhage: Secondary | ICD-10-CM | POA: Diagnosis not present

## 2018-04-29 DIAGNOSIS — R1312 Dysphagia, oropharyngeal phase: Secondary | ICD-10-CM | POA: Diagnosis not present

## 2018-04-29 DIAGNOSIS — B3731 Acute candidiasis of vulva and vagina: Secondary | ICD-10-CM

## 2018-04-29 DIAGNOSIS — R2681 Unsteadiness on feet: Secondary | ICD-10-CM | POA: Diagnosis not present

## 2018-04-29 DIAGNOSIS — M6281 Muscle weakness (generalized): Secondary | ICD-10-CM | POA: Diagnosis not present

## 2018-04-30 DIAGNOSIS — R1312 Dysphagia, oropharyngeal phase: Secondary | ICD-10-CM | POA: Diagnosis not present

## 2018-04-30 DIAGNOSIS — M6281 Muscle weakness (generalized): Secondary | ICD-10-CM | POA: Diagnosis not present

## 2018-04-30 DIAGNOSIS — I6910 Unspecified sequelae of nontraumatic intracerebral hemorrhage: Secondary | ICD-10-CM | POA: Diagnosis not present

## 2018-04-30 DIAGNOSIS — R2681 Unsteadiness on feet: Secondary | ICD-10-CM | POA: Diagnosis not present

## 2018-05-02 ENCOUNTER — Encounter: Payer: Self-pay | Admitting: Internal Medicine

## 2018-05-02 DIAGNOSIS — R1312 Dysphagia, oropharyngeal phase: Secondary | ICD-10-CM | POA: Diagnosis not present

## 2018-05-02 DIAGNOSIS — R2681 Unsteadiness on feet: Secondary | ICD-10-CM | POA: Diagnosis not present

## 2018-05-02 DIAGNOSIS — I6910 Unspecified sequelae of nontraumatic intracerebral hemorrhage: Secondary | ICD-10-CM | POA: Diagnosis not present

## 2018-05-02 DIAGNOSIS — M6281 Muscle weakness (generalized): Secondary | ICD-10-CM | POA: Diagnosis not present

## 2018-05-02 NOTE — Progress Notes (Signed)
Location:  [place] of Service:  SNF (31)  [name], MD  Patient Care Team: [name], MD as PCP - General (Internal Medicine)  Extended Emergency Contact Information Primary Emergency Contact: [facility] Address: [place], [place] [address] Johnnette Litter of [place] Phone: [phone] Relation: Son Secondary Emergency Contact: [place], [place] [address] Johnnette Litter of [place] Phone: [phone] Relation: Son    Allergies: Patient has no known allergies.  Chief Complaint  Patient presents with  . Acute Visit    HPI: Patient is 85 y.o. female who is being seen because she is complaining of itching in her vagina.  The nurses report that patient has been scratching in that area.  Past Medical History:  Diagnosis Date  . Acute encephalopathy 03/29/2015  . Cardiac arrhythmia 12/13/2012   Per pt, takes atenolol, pradaxa. asymptomatic.   Marland Kitchen Chronic atrial fibrillation   . Chronic renal insufficiency, stage III (moderate) (HCC)   . CKD (chronic kidney disease) stage 3, GFR 30-59 ml/min (HCC) 09/17/2015  . Closed fracture of neck of left femur ([place])   . Confusion 06/20/2015  . Constipation   . CVA (cerebral infarction) approx 2012   residual defect: poor R sided peripheral vision per son  . Dementia with behavioral disturbance ([place]) 11/08/2015  . Depression   . Femoral neck fracture ([place]) 12/01/2015  . Hip fracture ([place]) 03/20/2015  . History of depression 12/11/2012   Pt reports 40 pound weight loss. No current Tx.   Marland Kitchen History of fracture of right hip 03/2015  . Hyperlipidemia   . Hypertension   . Hypothyroidism   . Insomnia   . Osteoarthritis    wrists, hands  . Osteoporosis   . Urinary incontinence 12/13/2012  . Vitamin D deficiency 03/29/2015    Past Surgical History:  Procedure Laterality Date  . APPENDECTOMY    . COLONOSCOPY  08/22/08  . INTRAMEDULLARY (IM) NAIL  INTERTROCHANTERIC Right 03/22/2015   Procedure: RIGHT femoral HIP NAILING;  Surgeon: Gaynelle Arabian, MD;  Location: [facility];  Service: Orthopedics;  Laterality: Right;  . KNEE ARTHROSCOPY Right   . THYROID SURGERY      Allergies as of 04/29/2018   No Known Allergies     Medication List       Accurate as of April 29, 2018 11:59 PM. Always use your most recent med list.        acetaminophen 325 MG tablet Commonly known as:  TYLENOL Take 650 mg by mouth 3 (three) times daily.   acetaminophen 500 MG tablet Commonly known as:  TYLENOL Take 500 mg by mouth every 6 (six) hours as needed for mild pain, moderate pain or headache.   atenolol 50 MG tablet Commonly known as:  TENORMIN Take 50 mg by mouth daily.   Eliquis 2.5 MG Tabs tablet Generic drug:  apixaban Take 2.5 mg by mouth 2 (two) times daily. 9am and 5pm   feeding supplement Liqd Take 1 Container by mouth 3 (three) times daily between meals.   Nutritional Supplement Liqd Take by mouth. Initiate 62mL Medpass TID d/t weight loss.   ondansetron 4 MG disintegrating tablet Commonly known as:  ZOFRAN-ODT Take 4 mg by mouth every 8 (eight) hours as needed for nausea or vomiting.   senna 8.6 MG tablet Commonly known as:  SENOKOT Take 1 tablet by  mouth at bedtime as needed for constipation.   tiZANidine 2 MG tablet Commonly known as:  ZANAFLEX Take 2 mg by mouth every 8 (eight) hours as needed for muscle spasms.   Vitamin D3 125 MCG (5000 UT) Tabs Take by mouth. Give 1 tablet by mouth weekly for Vitamin D def       No orders of the defined types were placed in this encounter.   Immunization History  Administered Date(s) Administered  . Influenza Split 11/20/2012  . Influenza-Unspecified 10/07/2015, 10/18/2016  . PPD Test 08/14/2015  . Pneumococcal Conjugate-13 05/22/2015  . Pneumococcal Polysaccharide-23 08/22/2008  . Tdap 01/01/2015, 08/06/2015, 01/10/2016    Social History   Tobacco Use  . Smoking status:  Never Smoker  . Smokeless tobacco: Never Used  Substance Use Topics  . Alcohol use: No    Review of Systems  DATA OBTAINED: from patient, nurse GENERAL:  no fevers, fatigue, appetite changes SKIN: No itching, rash HEENT: No complaint RESPIRATORY: No cough, wheezing, SOB CARDIAC: No chest pain, palpitations, lower extremity edema  GI: No abdominal pain, No N/V/D or constipation, No heartburn or reflux  GU: No dysuria, frequency or urgency, or incontinence;+ vaginal itching MUSCULOSKELETAL: No unrelieved bone/joint pain NEUROLOGIC: No headache, dizziness  PSYCHIATRIC: No overt anxiety or sadness  Vitals:   05/02/18 2204  BP: 118/66  Pulse: 68  Resp: 18  Temp: (!) 97.5 F (36.4 C)   Body mass index is 16.92 kg/m. Physical Exam  GENERAL APPEARANCE: Alert, conversant, No acute distress  SKIN: No diaphoresis rash HEENT: Unremarkable RESPIRATORY: Breathing is even, unlabored. Lung sounds are clear   CARDIOVASCULAR: Heart RRR no murmurs, rubs or gallops. No peripheral edema  GASTROINTESTINAL: Abdomen is soft, non-tender, not distended w/ normal bowel sounds.  GENITOURINARY: Bladder non tender, not distended  MUSCULOSKELETAL: No abnormal joints or musculature NEUROLOGIC: Cranial nerves 2-12 grossly intact. Moves all extremities PSYCHIATRIC: Mood and affect appropriate to situation with dementia, no behavioral issues  Patient Active Problem List   Diagnosis Date Noted  . Failure to thrive in adult 07/14/2016  . Femoral neck fracture ([place]) 12/01/2015  . Malnutrition of moderate degree 12/01/2015  . Closed fracture of neck of left femur ([place])   . Mood disorder ([place]) 11/19/2015  . Dementia with behavioral disturbance ([place]) 11/08/2015  . CKD (chronic kidney disease) stage 3, GFR 30-59 ml/min (HCC) 09/17/2015  . Insomnia 08/18/2015  . Confusion 06/20/2015  . Recent urinary tract infection 06/20/2015  . Dysuria 04/10/2015  . Edema 04/06/2015  . Acute encephalopathy 03/29/2015   . Vitamin D deficiency 03/29/2015  . UTI (lower urinary tract infection) 03/21/2015  . Hip fracture ([place]) 03/20/2015  . Chronic atrial fibrillation 02/20/2015  . Hyperlipidemia 02/20/2015  . Essential hypertension, benign 12/13/2012  . Cardiac arrhythmia 12/13/2012  . Urinary incontinence 12/13/2012  . Hypothyroidism 12/13/2012  . History of depression 12/11/2012    CMP     Component Value Date/Time   NA 144 10/13/2017   K 4.0 10/13/2017   CL 103 01/10/2016 0030   CO2 23 01/10/2016 0030   GLUCOSE 111 (H) 01/10/2016 0030   BUN 37 (A) 10/13/2017   CREATININE 1.3 (A) 10/13/2017   CREATININE 1.26 (H) 01/10/2016 0030   CALCIUM 8.6 (L) 01/10/2016 0030   PROT 6.9 12/01/2015 0506   ALBUMIN 3.6 12/01/2015 0506   AST 16 10/13/2017   ALT 9 10/13/2017   ALKPHOS 68 10/13/2017   BILITOT 0.6 12/01/2015 0506   GFRNONAA 36 (L) 01/10/2016 0030   GFRAA  41 (L) 01/10/2016 0030   Recent Labs    10/13/17  NA 144  K 4.0  BUN 37*  CREATININE 1.3*   Recent Labs    10/13/17  AST 16  ALT 9  ALKPHOS 68   Recent Labs    10/13/17  WBC 5.2  HGB 10.9*  HCT 33*  PLT 218   Recent Labs    10/13/17  CHOL 131  LDLCALC 67  TRIG 73   No results found for: MICROALBUR Lab Results  Component Value Date   TSH 0.32 (A) 04/08/2018   Lab Results  Component Value Date   HGBA1C 5.6 10/13/2017   Lab Results  Component Value Date   CHOL 131 10/13/2017   HDL 49 10/13/2017   LDLCALC 67 10/13/2017   TRIG 73 10/13/2017    Significant Diagnostic Results in last 30 days:  No results found.  Assessment and Plan  Vaginal Candida- Diflucan 150 mg p.o. now and 1 dose again in 72 hours     Inocencio Homes, MD

## 2018-05-04 DIAGNOSIS — I6910 Unspecified sequelae of nontraumatic intracerebral hemorrhage: Secondary | ICD-10-CM | POA: Diagnosis not present

## 2018-05-04 DIAGNOSIS — M6281 Muscle weakness (generalized): Secondary | ICD-10-CM | POA: Diagnosis not present

## 2018-05-04 DIAGNOSIS — R1312 Dysphagia, oropharyngeal phase: Secondary | ICD-10-CM | POA: Diagnosis not present

## 2018-05-04 DIAGNOSIS — R2681 Unsteadiness on feet: Secondary | ICD-10-CM | POA: Diagnosis not present

## 2018-05-05 DIAGNOSIS — R2681 Unsteadiness on feet: Secondary | ICD-10-CM | POA: Diagnosis not present

## 2018-05-05 DIAGNOSIS — I6910 Unspecified sequelae of nontraumatic intracerebral hemorrhage: Secondary | ICD-10-CM | POA: Diagnosis not present

## 2018-05-05 DIAGNOSIS — M6281 Muscle weakness (generalized): Secondary | ICD-10-CM | POA: Diagnosis not present

## 2018-05-05 DIAGNOSIS — R1312 Dysphagia, oropharyngeal phase: Secondary | ICD-10-CM | POA: Diagnosis not present

## 2018-05-06 DIAGNOSIS — R2681 Unsteadiness on feet: Secondary | ICD-10-CM | POA: Diagnosis not present

## 2018-05-06 DIAGNOSIS — R1312 Dysphagia, oropharyngeal phase: Secondary | ICD-10-CM | POA: Diagnosis not present

## 2018-05-06 DIAGNOSIS — I6910 Unspecified sequelae of nontraumatic intracerebral hemorrhage: Secondary | ICD-10-CM | POA: Diagnosis not present

## 2018-05-06 DIAGNOSIS — M6281 Muscle weakness (generalized): Secondary | ICD-10-CM | POA: Diagnosis not present

## 2018-05-07 DIAGNOSIS — R1312 Dysphagia, oropharyngeal phase: Secondary | ICD-10-CM | POA: Diagnosis not present

## 2018-05-07 DIAGNOSIS — M6281 Muscle weakness (generalized): Secondary | ICD-10-CM | POA: Diagnosis not present

## 2018-05-07 DIAGNOSIS — I6910 Unspecified sequelae of nontraumatic intracerebral hemorrhage: Secondary | ICD-10-CM | POA: Diagnosis not present

## 2018-05-07 DIAGNOSIS — R2681 Unsteadiness on feet: Secondary | ICD-10-CM | POA: Diagnosis not present

## 2018-05-08 DIAGNOSIS — M6281 Muscle weakness (generalized): Secondary | ICD-10-CM | POA: Diagnosis not present

## 2018-05-08 DIAGNOSIS — I6910 Unspecified sequelae of nontraumatic intracerebral hemorrhage: Secondary | ICD-10-CM | POA: Diagnosis not present

## 2018-05-08 DIAGNOSIS — R1312 Dysphagia, oropharyngeal phase: Secondary | ICD-10-CM | POA: Diagnosis not present

## 2018-05-08 DIAGNOSIS — R2681 Unsteadiness on feet: Secondary | ICD-10-CM | POA: Diagnosis not present

## 2018-05-10 DIAGNOSIS — D649 Anemia, unspecified: Secondary | ICD-10-CM | POA: Diagnosis not present

## 2018-05-10 DIAGNOSIS — M79631 Pain in right forearm: Secondary | ICD-10-CM | POA: Diagnosis not present

## 2018-05-10 DIAGNOSIS — M25511 Pain in right shoulder: Secondary | ICD-10-CM | POA: Diagnosis not present

## 2018-05-10 DIAGNOSIS — M79621 Pain in right upper arm: Secondary | ICD-10-CM | POA: Diagnosis not present

## 2018-05-10 DIAGNOSIS — Z7901 Long term (current) use of anticoagulants: Secondary | ICD-10-CM | POA: Diagnosis not present

## 2018-05-10 LAB — CBC AND DIFFERENTIAL
HCT: 35 — AB (ref 36–46)
Hemoglobin: 11.2 — AB (ref 12.0–16.0)
Platelets: 224 (ref 150–399)
WBC: 5.1

## 2018-05-11 ENCOUNTER — Non-Acute Institutional Stay (SKILLED_NURSING_FACILITY): Payer: Medicare Other | Admitting: Internal Medicine

## 2018-05-11 DIAGNOSIS — S40021A Contusion of right upper arm, initial encounter: Secondary | ICD-10-CM | POA: Diagnosis not present

## 2018-05-11 DIAGNOSIS — R2681 Unsteadiness on feet: Secondary | ICD-10-CM | POA: Diagnosis not present

## 2018-05-11 DIAGNOSIS — M6281 Muscle weakness (generalized): Secondary | ICD-10-CM | POA: Diagnosis not present

## 2018-05-11 DIAGNOSIS — R1312 Dysphagia, oropharyngeal phase: Secondary | ICD-10-CM | POA: Diagnosis not present

## 2018-05-11 DIAGNOSIS — I6910 Unspecified sequelae of nontraumatic intracerebral hemorrhage: Secondary | ICD-10-CM | POA: Diagnosis not present

## 2018-05-12 ENCOUNTER — Non-Acute Institutional Stay (SKILLED_NURSING_FACILITY): Payer: Medicare Other | Admitting: Internal Medicine

## 2018-05-12 ENCOUNTER — Encounter: Payer: Self-pay | Admitting: Internal Medicine

## 2018-05-12 DIAGNOSIS — R627 Adult failure to thrive: Secondary | ICD-10-CM

## 2018-05-12 DIAGNOSIS — I482 Chronic atrial fibrillation, unspecified: Secondary | ICD-10-CM

## 2018-05-12 DIAGNOSIS — M6281 Muscle weakness (generalized): Secondary | ICD-10-CM | POA: Diagnosis not present

## 2018-05-12 DIAGNOSIS — R1312 Dysphagia, oropharyngeal phase: Secondary | ICD-10-CM | POA: Diagnosis not present

## 2018-05-12 DIAGNOSIS — I10 Essential (primary) hypertension: Secondary | ICD-10-CM

## 2018-05-12 DIAGNOSIS — I6910 Unspecified sequelae of nontraumatic intracerebral hemorrhage: Secondary | ICD-10-CM | POA: Diagnosis not present

## 2018-05-12 DIAGNOSIS — R2681 Unsteadiness on feet: Secondary | ICD-10-CM | POA: Diagnosis not present

## 2018-05-12 NOTE — Progress Notes (Signed)
Location:  [facility] of Service:  SNF (31)  [name], MD  Patient Care Team: [name], MD as PCP - General (Internal Medicine)  Extended Emergency Contact Information Primary Emergency Contact: [facility] Address: [place], [place] [address] Johnnette Litter of [place] Phone: [phone] Relation: Son Secondary Emergency Contact: [place],  [address] Johnnette Litter of [place] Phone: [phone] Relation: Son    Allergies: Patient has no known allergies.  Chief Complaint  Patient presents with  . Acute Visit    HPI: Patient is 85 y.o. female who nursing asked me to see because she fell over the weekend and has bruising to her right arm.  Patient had multiple x-rays which showed some prior fractures but the x-ray of her right humerus was read "cannot rule out acute fracture to proximal humerus".  Nursing reports that she spontaneously used her right arm for eating and does not appear in distress or pain when she moves it.  She said the same to me.  I palpated her entire humerus, which was easy because she is quite thin, and did range of motion with her shoulder which did not elicit pain.  Patient does have a very large bruise and hematoma to her proximal to mid arm.  Past Medical History:  Diagnosis Date  . Acute encephalopathy 03/29/2015  . Cardiac arrhythmia 12/13/2012   Per pt, takes atenolol, pradaxa. asymptomatic.   Marland Kitchen Chronic atrial fibrillation   . Chronic renal insufficiency, stage III (moderate) (HCC)   . CKD (chronic kidney disease) stage 3, GFR 30-59 ml/min (HCC) 09/17/2015  . Closed fracture of neck of left femur ([place])   . Confusion 06/20/2015  . Constipation   . CVA (cerebral infarction) approx 2012   residual defect: poor R sided peripheral vision per son  . Dementia with behavioral disturbance ([place]) 11/08/2015  . Depression   . Femoral neck fracture ([place]) 12/01/2015  .  Hip fracture ([place]) 03/20/2015  . History of depression 12/11/2012   Pt reports 40 pound weight loss. No current Tx.   Marland Kitchen History of fracture of right hip 03/2015  . Hyperlipidemia   . Hypertension   . Hypothyroidism   . Insomnia   . Osteoarthritis    wrists, hands  . Osteoporosis   . Urinary incontinence 12/13/2012  . Vitamin D deficiency 03/29/2015    Past Surgical History:  Procedure Laterality Date  . APPENDECTOMY    . COLONOSCOPY  08/22/08  . INTRAMEDULLARY (IM) NAIL INTERTROCHANTERIC Right 03/22/2015   Procedure: RIGHT femoral HIP NAILING;  Surgeon: Gaynelle Arabian, MD;  Location: [facility];  Service: Orthopedics;  Laterality: Right;  . KNEE ARTHROSCOPY Right   . THYROID SURGERY      Allergies as of 05/11/2018   No Known Allergies     Medication List       Accurate as of May 11, 2018 11:59 PM. Always use your most recent med list.        acetaminophen 325 MG tablet Commonly known as:  TYLENOL Take 650 mg by mouth 3 (three) times daily.   acetaminophen 500 MG tablet Commonly known as:  TYLENOL Take 500 mg by mouth every 6 (six) hours as needed for mild pain, moderate pain or headache.   atenolol 50 MG tablet Commonly known as:  TENORMIN Take 50 mg by mouth daily.  Eliquis 2.5 MG Tabs tablet Generic drug:  apixaban Take 2.5 mg by mouth 2 (two) times daily. 9am and 5pm   feeding supplement Liqd Take 1 Container by mouth 3 (three) times daily between meals.   Nutritional Supplement Liqd Take by mouth. Initiate 5mL Medpass TID d/t weight loss.   ondansetron 4 MG disintegrating tablet Commonly known as:  ZOFRAN-ODT Take 4 mg by mouth every 8 (eight) hours as needed for nausea or vomiting.   senna 8.6 MG tablet Commonly known as:  SENOKOT Take 1 tablet by mouth at bedtime as needed for constipation.   tiZANidine 2 MG tablet Commonly known as:  ZANAFLEX Take 2 mg by mouth every 8 (eight) hours as needed for muscle spasms.   Vitamin D3 125 MCG (5000 UT) Tabs Take  by mouth. Give 1 tablet by mouth weekly for Vitamin D def       No orders of the defined types were placed in this encounter.   Immunization History  Administered Date(s) Administered  . Influenza Split 11/20/2012  . Influenza-Unspecified 10/07/2015, 10/18/2016  . PPD Test 08/14/2015  . Pneumococcal Conjugate-13 05/22/2015  . Pneumococcal Polysaccharide-23 08/22/2008  . Tdap 01/01/2015, 08/06/2015, 01/10/2016    Social History   Tobacco Use  . Smoking status: Never Smoker  . Smokeless tobacco: Never Used  Substance Use Topics  . Alcohol use: No    Review of Systems  DATA OBTAINED: from patient, nurse GENERAL:  no fevers, fatigue, appetite changes SKIN: Large bruise right arm with an area of hematoma that has mild heat HEENT: No complaint RESPIRATORY: No cough, wheezing, SOB CARDIAC: No chest pain, palpitations, lower extremity edema  GI: No abdominal pain, No N/V/D or constipation, No heartburn or reflux  GU: No dysuria, frequency or urgency, or incontinence  MUSCULOSKELETAL: No pain to right shoulder NEUROLOGIC: No headache, dizziness  PSYCHIATRIC: No overt anxiety or sadness  Vitals:   05/12/18 1143  BP: 123/69  Pulse: (!) 53  Resp: 16  Temp: (!) 97.2 F (36.2 C)   Body mass index is 16.92 kg/m. Physical Exam  GENERAL APPEARANCE: Alert, conversant, No acute distress  SKIN: Large bruise to right proximal to mid arm, with 1 area of hematoma that has mild heat HEENT: Unremarkable RESPIRATORY: Breathing is even, unlabored. Lung sounds are clear   CARDIOVASCULAR: Heart RRR no murmurs, rubs or gallops. No peripheral edema  GASTROINTESTINAL: Abdomen is soft, non-tender, not distended w/ normal bowel sounds.  GENITOURINARY: Bladder non tender, not distended  MUSCULOSKELETAL: I palpated patient's entire humerus, which was easy because she is so thin, without eliciting any pain and did range of motion with shoulder which did also not elicit pain NEUROLOGIC:  Cranial nerves 2-12 grossly intact. Moves all extremities PSYCHIATRIC: Mood and affect appropriate to situation, no behavioral issues  Patient Active Problem List   Diagnosis Date Noted  . Failure to thrive in adult 07/14/2016  . Femoral neck fracture ([place]) 12/01/2015  . Malnutrition of moderate degree 12/01/2015  . Closed fracture of neck of left femur ([place])   . Mood disorder ([place]) 11/19/2015  . Dementia with behavioral disturbance ([place]) 11/08/2015  . CKD (chronic kidney disease) stage 3, GFR 30-59 ml/min (HCC) 09/17/2015  . Insomnia 08/18/2015  . Confusion 06/20/2015  . Recent urinary tract infection 06/20/2015  . Dysuria 04/10/2015  . Edema 04/06/2015  . Acute encephalopathy 03/29/2015  . Vitamin D deficiency 03/29/2015  . UTI (lower urinary tract infection) 03/21/2015  . Hip fracture ([place]) 03/20/2015  . Chronic  atrial fibrillation 02/20/2015  . Hyperlipidemia 02/20/2015  . Essential hypertension, benign 12/13/2012  . Cardiac arrhythmia 12/13/2012  . Urinary incontinence 12/13/2012  . Hypothyroidism 12/13/2012  . History of depression 12/11/2012    CMP     Component Value Date/Time   NA 144 10/13/2017   K 4.0 10/13/2017   CL 103 01/10/2016 0030   CO2 23 01/10/2016 0030   GLUCOSE 111 (H) 01/10/2016 0030   BUN 37 (A) 10/13/2017   CREATININE 1.3 (A) 10/13/2017   CREATININE 1.26 (H) 01/10/2016 0030   CALCIUM 8.6 (L) 01/10/2016 0030   PROT 6.9 12/01/2015 0506   ALBUMIN 3.6 12/01/2015 0506   AST 16 10/13/2017   ALT 9 10/13/2017   ALKPHOS 68 10/13/2017   BILITOT 0.6 12/01/2015 0506   GFRNONAA 36 (L) 01/10/2016 0030   GFRAA 41 (L) 01/10/2016 0030   Recent Labs    10/13/17  NA 144  K 4.0  BUN 37*  CREATININE 1.3*   Recent Labs    10/13/17  AST 16  ALT 9  ALKPHOS 68   Recent Labs    10/13/17  WBC 5.2  HGB 10.9*  HCT 33*  PLT 218   Recent Labs    10/13/17  CHOL 131  LDLCALC 67  TRIG 73   No results found for: MICROALBUR Lab Results  Component  Value Date   TSH 0.32 (A) 04/08/2018   Lab Results  Component Value Date   HGBA1C 5.6 10/13/2017   Lab Results  Component Value Date   CHOL 131 10/13/2017   HDL 49 10/13/2017   LDLCALC 67 10/13/2017   TRIG 73 10/13/2017    Significant Diagnostic Results in last 30 days:  No results found.  Assessment and Plan  Bruise right arm/hematoma right arm- we will hold Coumadin 1 more day, then restart; will monitor for any complications or evidence of pain.      Inocencio Homes, MD

## 2018-05-12 NOTE — Progress Notes (Signed)
Location:  [place] Room Number: 311-W Place of Service:  SNF (31)  [name], MD  Patient Care Team: [name], MD as PCP - General (Internal Medicine)  Extended Emergency Contact Information Primary Emergency Contact: [facility] Address: [address]          [place], [place] [address] Johnnette Litter of [place] Phone: [phone] Relation: Son Secondary Emergency Contact: [place], [place] [address] Johnnette Litter of [place] Phone: [phone] Relation: Son    Allergies: Patient has no known allergies.  Chief Complaint  Patient presents with  . Medical Management of Chronic Issues    Routine [facility] visit    HPI: Patient is 85 y.o. female who is being seen for routine issues of hypertension, chronic atrial fib, and failure to thrive.  Past Medical History:  Diagnosis Date  . Acute encephalopathy 03/29/2015  . Cardiac arrhythmia 12/13/2012   Per pt, takes atenolol, pradaxa. asymptomatic.   Marland Kitchen Chronic atrial fibrillation   . Chronic renal insufficiency, stage III (moderate) (HCC)   . CKD (chronic kidney disease) stage 3, GFR 30-59 ml/min (HCC) 09/17/2015  . Closed fracture of neck of left femur ([place])   . Confusion 06/20/2015  . Constipation   . CVA (cerebral infarction) approx 2012   residual defect: poor R sided peripheral vision per son  . Dementia with behavioral disturbance ([place]) 11/08/2015  . Femoral neck fracture ([place]) 12/01/2015  . Hip fracture ([place]) 03/20/2015  . History of depression 12/11/2012   Pt reports 40 pound weight loss. No current Tx.   Marland Kitchen History of fracture of right hip 03/2015  . Hyperlipidemia   . Hypertension   . Hypothyroidism   . Insomnia   . Osteoarthritis    wrists, hands  . Osteoporosis   . Urinary incontinence 12/13/2012  . Vitamin D deficiency 03/29/2015    Past Surgical History:  Procedure Laterality Date  . APPENDECTOMY    . COLONOSCOPY  08/22/08  .  INTRAMEDULLARY (IM) NAIL INTERTROCHANTERIC Right 03/22/2015   Procedure: RIGHT femoral HIP NAILING;  Surgeon: Gaynelle Arabian, MD;  Location: [facility];  Service: Orthopedics;  Laterality: Right;  . KNEE ARTHROSCOPY Right   . THYROID SURGERY      Allergies as of 05/12/2018   No Known Allergies     Medication List       Accurate as of May 12, 2018 11:59 PM. If you have any questions, ask your nurse or doctor.        acetaminophen 325 MG tablet Commonly known as:  TYLENOL Take 650 mg by mouth 3 (three) times daily.   acetaminophen 500 MG tablet Commonly known as:  TYLENOL Take 500 mg by mouth every 6 (six) hours as needed for mild pain, moderate pain or headache.   atenolol 50 MG tablet Commonly known as:  TENORMIN Take 50 mg by mouth daily.   divalproex 125 MG capsule Commonly known as:  DEPAKOTE SPRINKLE Take 250 mg by mouth 2 (two) times daily. Take 2 capsules to = 250 mg   Eliquis 2.5 MG Tabs tablet Generic drug:  apixaban Take 2.5 mg by mouth 2 (two) times daily. 9am and 5pm   feeding supplement Liqd Take 1 Container by mouth 3 (three) times daily between meals.   Nutritional Supplement Liqd Take 60 mLs by mouth 3 (three) times daily.   ondansetron 4 MG disintegrating tablet Commonly known as:  ZOFRAN-ODT Take 4 mg by mouth every 8 (eight) hours as needed for nausea or vomiting.   senna 8.6 MG tablet Commonly known as:  SENOKOT Take 1 tablet by mouth at bedtime as needed for constipation.   tiZANidine 2 MG tablet Commonly known as:  ZANAFLEX Take 2 mg by mouth every 8 (eight) hours as needed for muscle spasms.   Vitamin D3 125 MCG (5000 UT) Tabs Take by mouth. Give 1 tablet by mouth weekly for Vitamin D def       No orders of the defined types were placed in this encounter.   Immunization History  Administered Date(s) Administered  . Influenza Split 11/20/2012  . Influenza-Unspecified 10/07/2015, 10/18/2016  . PPD Test 08/14/2015  . Pneumococcal  Conjugate-13 05/22/2015  . Pneumococcal Polysaccharide-23 08/22/2008  . Tdap 01/01/2015, 08/06/2015, 01/10/2016    Social History   Tobacco Use  . Smoking status: Never Smoker  . Smokeless tobacco: Never Used  Substance Use Topics  . Alcohol use: No    Review of Systems  DATA OBTAINED: from patient-limited; nursing-no acute concerns GENERAL:  no fevers, fatigue, appetite changes SKIN: No itching, rash HEENT: No complaint RESPIRATORY: No cough, wheezing, SOB CARDIAC: No chest pain, palpitations, lower extremity edema  GI: No abdominal pain, No N/V/D or constipation, No heartburn or reflux  GU: No dysuria, frequency or urgency, or incontinence  MUSCULOSKELETAL: No unrelieved bone/joint pain NEUROLOGIC: No headache, dizziness  PSYCHIATRIC: No overt anxiety or sadness  Vitals:   05/12/18 1148  BP: 123/69  Pulse: (!) 53  Resp: 16  Temp: (!) 97 F (36.1 C)  SpO2: 94%   Body mass index is 16.92 kg/m. Physical Exam  GENERAL APPEARANCE: Alert, conversant, No acute distress  SKIN: Large bruise right arm HEENT: Unremarkable RESPIRATORY: Breathing is even, unlabored. Lung sounds are clear   CARDIOVASCULAR: Heart irregular no murmurs, rubs or gallops. No peripheral edema  GASTROINTESTINAL: Abdomen is soft, non-tender, not distended w/ normal bowel sounds.  GENITOURINARY: Bladder non tender, not distended  MUSCULOSKELETAL: No abnormal joints or musculature NEUROLOGIC: Cranial nerves 2-12 grossly intact. Moves all extremities PSYCHIATRIC: Mood and affect dementia, no behavioral issues  Patient Active Problem List   Diagnosis Date Noted  . Failure to thrive in adult 07/14/2016  . Femoral neck fracture ([place]) 12/01/2015  . Malnutrition of moderate degree 12/01/2015  . Closed fracture of neck of left femur ([place])   . Mood disorder ([place]) 11/19/2015  . Dementia with behavioral disturbance ([place]) 11/08/2015  . CKD (chronic kidney disease) stage 3, GFR 30-59 ml/min (HCC) 09/17/2015   . Insomnia 08/18/2015  . Confusion 06/20/2015  . Recent urinary tract infection 06/20/2015  . Dysuria 04/10/2015  . Edema 04/06/2015  . Acute encephalopathy 03/29/2015  . Vitamin D deficiency 03/29/2015  . UTI (lower urinary tract infection) 03/21/2015  . Hip fracture ([place]) 03/20/2015  . Chronic atrial fibrillation 02/20/2015  . Hyperlipidemia 02/20/2015  . Essential hypertension, benign 12/13/2012  . Cardiac arrhythmia 12/13/2012  . Urinary incontinence 12/13/2012  . Hypothyroidism 12/13/2012  . History of depression 12/11/2012    CMP     Component Value Date/Time   NA 144 10/13/2017   K 4.0 10/13/2017   CL 103 01/10/2016 0030   CO2 23 01/10/2016 0030   GLUCOSE 111 (H) 01/10/2016 0030   BUN 37 (A) 10/13/2017   CREATININE 1.3 (A) 10/13/2017   CREATININE 1.26 (H) 01/10/2016 0030   CALCIUM 8.6 (L) 01/10/2016 0030   PROT 6.9 12/01/2015 0506   ALBUMIN 3.6  12/01/2015 0506   AST 16 10/13/2017   ALT 9 10/13/2017   ALKPHOS 68 10/13/2017   BILITOT 0.6 12/01/2015 0506   GFRNONAA 36 (L) 01/10/2016 0030   GFRAA 41 (L) 01/10/2016 0030   Recent Labs    10/13/17  NA 144  K 4.0  BUN 37*  CREATININE 1.3*   Recent Labs    10/13/17  AST 16  ALT 9  ALKPHOS 68   Recent Labs    10/13/17  WBC 5.2  HGB 10.9*  HCT 33*  PLT 218   Recent Labs    10/13/17  CHOL 131  LDLCALC 67  TRIG 73   No results found for: MICROALBUR Lab Results  Component Value Date   TSH 0.32 (A) 04/08/2018   Lab Results  Component Value Date   HGBA1C 5.6 10/13/2017   Lab Results  Component Value Date   CHOL 131 10/13/2017   HDL 49 10/13/2017   LDLCALC 67 10/13/2017   TRIG 73 10/13/2017    Significant Diagnostic Results in last 30 days:  No results found.  Assessment and Plan  Essential hypertension, benign Controlled; continue Tenormin 50 mg daily  Chronic atrial fibrillation (HCC) Chronic and stable; continue Eliquis 2.5 mg twice daily for prophylaxis and Tenormin 50 mg daily  for rate  Failure to thrive in adult Patient appears to be doing; eating sometimes is still spotty; patient told me that she is "a tough old bird"; continue supportive care    Webb Silversmith D. [name], MD

## 2018-05-13 ENCOUNTER — Encounter: Payer: Self-pay | Admitting: Internal Medicine

## 2018-05-13 DIAGNOSIS — I6910 Unspecified sequelae of nontraumatic intracerebral hemorrhage: Secondary | ICD-10-CM | POA: Diagnosis not present

## 2018-05-13 DIAGNOSIS — R2681 Unsteadiness on feet: Secondary | ICD-10-CM | POA: Diagnosis not present

## 2018-05-13 DIAGNOSIS — M6281 Muscle weakness (generalized): Secondary | ICD-10-CM | POA: Diagnosis not present

## 2018-05-13 DIAGNOSIS — R1312 Dysphagia, oropharyngeal phase: Secondary | ICD-10-CM | POA: Diagnosis not present

## 2018-05-13 NOTE — Assessment & Plan Note (Signed)
Chronic and stable; continue Eliquis 2.5 mg twice daily for prophylaxis and Tenormin 50 mg daily for rate

## 2018-05-13 NOTE — Assessment & Plan Note (Signed)
Patient appears to be doing; eating sometimes is still spotty; patient told me that she is "a tough old bird"; continue supportive care

## 2018-05-13 NOTE — Assessment & Plan Note (Signed)
Controlled; continue Tenormin 50 mg daily

## 2018-05-14 DIAGNOSIS — R2681 Unsteadiness on feet: Secondary | ICD-10-CM | POA: Diagnosis not present

## 2018-05-14 DIAGNOSIS — I6910 Unspecified sequelae of nontraumatic intracerebral hemorrhage: Secondary | ICD-10-CM | POA: Diagnosis not present

## 2018-05-14 DIAGNOSIS — R1312 Dysphagia, oropharyngeal phase: Secondary | ICD-10-CM | POA: Diagnosis not present

## 2018-05-14 DIAGNOSIS — M6281 Muscle weakness (generalized): Secondary | ICD-10-CM | POA: Diagnosis not present

## 2018-05-15 DIAGNOSIS — M6281 Muscle weakness (generalized): Secondary | ICD-10-CM | POA: Diagnosis not present

## 2018-05-15 DIAGNOSIS — F419 Anxiety disorder, unspecified: Secondary | ICD-10-CM | POA: Diagnosis not present

## 2018-05-15 DIAGNOSIS — F0391 Unspecified dementia with behavioral disturbance: Secondary | ICD-10-CM | POA: Diagnosis not present

## 2018-05-15 DIAGNOSIS — I6910 Unspecified sequelae of nontraumatic intracerebral hemorrhage: Secondary | ICD-10-CM | POA: Diagnosis not present

## 2018-05-15 DIAGNOSIS — R1312 Dysphagia, oropharyngeal phase: Secondary | ICD-10-CM | POA: Diagnosis not present

## 2018-05-15 DIAGNOSIS — R2681 Unsteadiness on feet: Secondary | ICD-10-CM | POA: Diagnosis not present

## 2018-05-16 DIAGNOSIS — R319 Hematuria, unspecified: Secondary | ICD-10-CM | POA: Diagnosis not present

## 2018-05-16 DIAGNOSIS — R300 Dysuria: Secondary | ICD-10-CM | POA: Diagnosis not present

## 2018-05-16 DIAGNOSIS — N390 Urinary tract infection, site not specified: Secondary | ICD-10-CM | POA: Diagnosis not present

## 2018-06-12 DIAGNOSIS — F0391 Unspecified dementia with behavioral disturbance: Secondary | ICD-10-CM | POA: Diagnosis not present

## 2018-06-12 DIAGNOSIS — F419 Anxiety disorder, unspecified: Secondary | ICD-10-CM | POA: Diagnosis not present

## 2018-06-15 ENCOUNTER — Encounter: Payer: Self-pay | Admitting: Internal Medicine

## 2018-06-15 ENCOUNTER — Non-Acute Institutional Stay (SKILLED_NURSING_FACILITY): Payer: Medicare Other | Admitting: Internal Medicine

## 2018-06-15 DIAGNOSIS — F39 Unspecified mood [affective] disorder: Secondary | ICD-10-CM

## 2018-06-15 DIAGNOSIS — F0281 Dementia in other diseases classified elsewhere with behavioral disturbance: Secondary | ICD-10-CM | POA: Diagnosis not present

## 2018-06-15 DIAGNOSIS — Z8659 Personal history of other mental and behavioral disorders: Secondary | ICD-10-CM

## 2018-06-15 DIAGNOSIS — G301 Alzheimer's disease with late onset: Secondary | ICD-10-CM | POA: Diagnosis not present

## 2018-06-15 NOTE — Progress Notes (Signed)
Location:  [place] Room Number: [phone] Place of Service:  SNF [phone])  [name], MD  Patient Care Team: [name], MD as PCP - General (Internal Medicine)  Extended Emergency Contact Information Primary Emergency Contact: [facility] Address: [address]          [place], [place] [address] Johnnette Litter of [place] Phone: [phone] Relation: Son Secondary Emergency Contact: [place], [place] [address] Johnnette Litter of [place] Phone: [phone] Relation: Son   Chief Complaint  Patient presents with  . Medical Management of Chronic Issues    Routine Visit    HPI:  Pt is a 85 y.o. female seen today for routine issues of dementia, depression, and mood disorder.   Past Medical History:  Diagnosis Date  . Acute encephalopathy 03/29/2015  . Cardiac arrhythmia 12/13/2012   Per pt, takes atenolol, pradaxa. asymptomatic.   Marland Kitchen Chronic atrial fibrillation   . Chronic renal insufficiency, stage III (moderate) (HCC)   . CKD (chronic kidney disease) stage 3, GFR 30-59 ml/min (HCC) 09/17/2015  . Closed fracture of neck of left femur ([place])   . Confusion 06/20/2015  . Constipation   . CVA (cerebral infarction) approx 2012   residual defect: poor R sided peripheral vision per son  . Dementia with behavioral disturbance ([place]) 11/08/2015  . Femoral neck fracture ([place]) 12/01/2015  . Hip fracture ([place]) 03/20/2015  . History of depression 12/11/2012   Pt reports 40 pound weight loss. No current Tx.   Marland Kitchen History of fracture of right hip 03/2015  . Hyperlipidemia   . Hypertension   . Hypothyroidism   . Insomnia   . Osteoarthritis    wrists, hands  . Osteoporosis   . Urinary incontinence 12/13/2012  . Vitamin D deficiency 03/29/2015   Past Surgical History:  Procedure Laterality Date  . APPENDECTOMY    . COLONOSCOPY  08/22/08  . INTRAMEDULLARY (IM) NAIL INTERTROCHANTERIC Right 03/22/2015   Procedure: RIGHT femoral HIP  NAILING;  Surgeon: Gaynelle Arabian, MD;  Location: [facility];  Service: Orthopedics;  Laterality: Right;  . KNEE ARTHROSCOPY Right   . THYROID SURGERY      No Known Allergies  Allergies as of 06/15/2018   No Known Allergies     Medication List       Accurate as of June 15, 2018 11:59 PM. If you have any questions, ask your nurse or doctor.        acetaminophen 325 MG tablet Commonly known as: TYLENOL Take 650 mg by mouth 3 (three) times daily.   acetaminophen 500 MG tablet Commonly known as: TYLENOL Take 500 mg by mouth every 6 (six) hours as needed for mild pain, moderate pain or headache.   atenolol 50 MG tablet Commonly known as: TENORMIN Take 50 mg by mouth daily.   Cranberry 450 MG Tabs Take 1 tablet by mouth daily.   divalproex 125 MG capsule Commonly known as: DEPAKOTE SPRINKLE Take 250 mg by mouth 2 (two) times daily. Take 2 capsules to = 250 mg   Eliquis 2.5 MG Tabs tablet Generic drug: apixaban Take 2.5 mg by mouth 2 (two) times daily. 9am and 5pm   feeding supplement Liqd Take 1 Container by mouth 3 (three) times daily between meals.   Nutritional Supplement Liqd Take 60 mLs by mouth 3 (three) times daily. MEDPASS   ondansetron 4 MG disintegrating tablet Commonly known as: ZOFRAN-ODT Take 4  mg by mouth every 8 (eight) hours as needed for nausea or vomiting.   senna 8.6 MG tablet Commonly known as: SENOKOT Take 1 tablet by mouth at bedtime as needed for constipation.   tiZANidine 2 MG tablet Commonly known as: ZANAFLEX Take 2 mg by mouth every 8 (eight) hours as needed for muscle spasms.   Vitamin D3 125 MCG (5000 UT) Tabs Take by mouth. Give 1 tablet by mouth weekly for Vitamin D def       Review of Systems unable to obtain secondary to dementia; nursing-no acute concerns     Immunization History  Administered Date(s) Administered  . Influenza Split 11/20/2012  . Influenza-Unspecified 10/07/2015, 10/18/2016  . PPD Test 08/14/2015  .  Pneumococcal Conjugate-13 05/22/2015  . Pneumococcal Polysaccharide-23 08/22/2008  . Tdap 01/01/2015, 08/06/2015, 01/10/2016   Pertinent  Health Maintenance Due  Topic Date Due  . INFLUENZA VACCINE  08/08/2018  . DEXA SCAN  Completed  . PNA vac Low Risk Adult  Completed   Fall Risk  07/28/2017 07/24/2016  Falls in the past year? No Yes  Number falls in past yr: - 2 or more  Injury with Fall? - Yes    Vitals:   06/15/18 1253  BP: 96/63  Pulse: 66  Resp: 18  Temp: (!) 97 F (36.1 C)  TempSrc: Oral  SpO2: 97%  Weight: 100 lb 6.4 oz (45.5 kg)  Height: 5\' 4"  (1.626 m)   Body mass index is 17.23 kg/m.  Physical Exam  GENERAL APPEARANCE: Alert, conversant, No acute distress  SKIN: No diaphoresis rash HEENT: Unremarkable RESPIRATORY: Breathing is even, unlabored. Lung sounds are clear   CARDIOVASCULAR: Heart RRR no murmurs, rubs or gallops. No peripheral edema  GASTROINTESTINAL: Abdomen is soft, non-tender, not distended w/ normal bowel sounds.  GENITOURINARY: Bladder non tender, not distended  MUSCULOSKELETAL: No abnormal joints or musculature NEUROLOGIC: Cranial nerves 2-12 grossly intact. Moves all extremities PSYCHIATRIC: Dementia, no behavioral issues  Labs reviewed: Recent Labs    10/13/17  NA 144  K 4.0  BUN 37*  CREATININE 1.3*   Recent Labs    10/13/17  AST 16  ALT 9  ALKPHOS 68   Recent Labs    10/13/17 05/10/18  WBC 5.2 5.1  HGB 10.9* 11.2*  HCT 33* 35*  PLT 218 224   Recent Labs    10/13/17  CHOL 131  LDLCALC 67  TRIG 73   No results found for: MICROALBUR Lab Results  Component Value Date   TSH 0.32 (A) 04/08/2018   Lab Results  Component Value Date   HGBA1C 5.6 10/13/2017   Lab Results  Component Value Date   CHOL 131 10/13/2017   HDL 49 10/13/2017   LDLCALC 67 10/13/2017   TRIG 73 10/13/2017    Significant Diagnostic Results in last 30 days:  No results found.  Assessment/Plan Dementia with behavioral disturbance Stable;  on no meds; continue supportive care  History of depression Patient is on no meds; patient is failure to thrive; patient is not uncomfortable; continue supportive care  Mood disorder [facility]) Patient is pleasantly demented; continue Depakote 250 mg twice daily     D. [name], MD

## 2018-06-20 ENCOUNTER — Encounter: Payer: Self-pay | Admitting: Internal Medicine

## 2018-06-20 NOTE — Assessment & Plan Note (Signed)
Stable; on no meds; continue supportive care

## 2018-06-20 NOTE — Assessment & Plan Note (Signed)
Patient is pleasantly demented; continue Depakote 250 mg twice daily

## 2018-06-20 NOTE — Assessment & Plan Note (Signed)
Patient is on no meds; patient is failure to thrive; patient is not uncomfortable; continue supportive care

## 2018-06-22 ENCOUNTER — Emergency Department (HOSPITAL_COMMUNITY)
Admission: EM | Admit: 2018-06-22 | Discharge: 2018-06-23 | Disposition: A | Discharge status: Home / Self Care | Payer: Medicare Other | Attending: Emergency Medicine | Admitting: Emergency Medicine

## 2018-06-22 ENCOUNTER — Other Ambulatory Visit: Payer: Self-pay

## 2018-06-22 ENCOUNTER — Emergency Department (HOSPITAL_COMMUNITY): Payer: Medicare Other

## 2018-06-22 ENCOUNTER — Encounter (HOSPITAL_COMMUNITY): Payer: Self-pay | Admitting: Emergency Medicine

## 2018-06-22 DIAGNOSIS — E039 Hypothyroidism, unspecified: Secondary | ICD-10-CM | POA: Insufficient documentation

## 2018-06-22 DIAGNOSIS — W19XXXA Unspecified fall, initial encounter: Secondary | ICD-10-CM

## 2018-06-22 DIAGNOSIS — W06XXXA Fall from bed, initial encounter: Secondary | ICD-10-CM | POA: Insufficient documentation

## 2018-06-22 DIAGNOSIS — I10 Essential (primary) hypertension: Secondary | ICD-10-CM | POA: Diagnosis not present

## 2018-06-22 DIAGNOSIS — S51011A Laceration without foreign body of right elbow, initial encounter: Secondary | ICD-10-CM | POA: Diagnosis not present

## 2018-06-22 DIAGNOSIS — R52 Pain, unspecified: Secondary | ICD-10-CM | POA: Diagnosis not present

## 2018-06-22 DIAGNOSIS — S51811A Laceration without foreign body of right forearm, initial encounter: Secondary | ICD-10-CM | POA: Diagnosis not present

## 2018-06-22 DIAGNOSIS — I129 Hypertensive chronic kidney disease with stage 1 through stage 4 chronic kidney disease, or unspecified chronic kidney disease: Secondary | ICD-10-CM | POA: Insufficient documentation

## 2018-06-22 DIAGNOSIS — Y999 Unspecified external cause status: Secondary | ICD-10-CM | POA: Diagnosis not present

## 2018-06-22 DIAGNOSIS — S0990XA Unspecified injury of head, initial encounter: Secondary | ICD-10-CM | POA: Diagnosis not present

## 2018-06-22 DIAGNOSIS — R0902 Hypoxemia: Secondary | ICD-10-CM | POA: Diagnosis not present

## 2018-06-22 DIAGNOSIS — Y92122 Bedroom in nursing home as the place of occurrence of the external cause: Secondary | ICD-10-CM | POA: Insufficient documentation

## 2018-06-22 DIAGNOSIS — S199XXA Unspecified injury of neck, initial encounter: Secondary | ICD-10-CM | POA: Diagnosis not present

## 2018-06-22 DIAGNOSIS — Z7901 Long term (current) use of anticoagulants: Secondary | ICD-10-CM | POA: Insufficient documentation

## 2018-06-22 DIAGNOSIS — Y9389 Activity, other specified: Secondary | ICD-10-CM | POA: Insufficient documentation

## 2018-06-22 DIAGNOSIS — S61411A Laceration without foreign body of right hand, initial encounter: Secondary | ICD-10-CM | POA: Diagnosis not present

## 2018-06-22 DIAGNOSIS — S6981XA Other specified injuries of right wrist, hand and finger(s), initial encounter: Secondary | ICD-10-CM | POA: Diagnosis present

## 2018-06-22 DIAGNOSIS — F0390 Unspecified dementia without behavioral disturbance: Secondary | ICD-10-CM | POA: Diagnosis not present

## 2018-06-22 DIAGNOSIS — Z79899 Other long term (current) drug therapy: Secondary | ICD-10-CM | POA: Diagnosis not present

## 2018-06-22 DIAGNOSIS — Z23 Encounter for immunization: Secondary | ICD-10-CM | POA: Diagnosis not present

## 2018-06-22 DIAGNOSIS — N183 Chronic kidney disease, stage 3 (moderate): Secondary | ICD-10-CM | POA: Insufficient documentation

## 2018-06-22 MED ORDER — TETANUS-DIPHTH-ACELL PERTUSSIS 5-2.5-18.5 LF-MCG/0.5 IM SUSP
0.5000 mL | Freq: Once | INTRAMUSCULAR | Status: AC
  Administered 2018-06-23: 0.5 mL via INTRAMUSCULAR
  Filled 2018-06-22: qty 0.5

## 2018-06-22 MED ORDER — LIDOCAINE-EPINEPHRINE (PF) 2 %-1:200000 IJ SOLN
20.0000 mL | Freq: Once | INTRAMUSCULAR | Status: AC
  Administered 2018-06-22: 20 mL
  Filled 2018-06-22: qty 20

## 2018-06-22 NOTE — ED Triage Notes (Addendum)
Patient arrived with EMS from [facility] , pt. found on the floor by staff (attempting to transfer from bed to chair) this evening , no LOC, staff reported skin tears at right arm and right hand dressing applied at nursing home , pt. removed her C- collar and refuse to wear her mask. Patient stated mild headache and right arm pain . CBG=190.

## 2018-06-22 NOTE — ED Provider Notes (Addendum)
[facility] EMERGENCY DEPARTMENT Provider Note   CSN: [id number] Arrival date & time: 06/22/18  2025    History   Chief Complaint Chief Complaint  Patient presents with   Fall    HPI Marie Robertson is a 85 y.o. female with history of dementia, atrial fibrillation on Eliquis is brought to the ER from [place] for evaluation of fall.  Per triage and EMS report, patient was being transferred from bed to chair and she fell down landing mostly on her right upper extremity.  She has multiple skin tears and lacerations to her right dorsum hand and forearm.  Patient reports local pain to these areas but otherwise has no other complaints. Denies headache, neck pain, CP, SOB, abdominal pain. Level 5 caveat: Underlying dementia.  She is oriented to self, can tell me she fell and her son is Marie Robertson however other information questionable. Unknown head trauma, LOC.  Will attempt to contact staff at [facility] farm/family members.     HPI  Past Medical History:  Diagnosis Date   Acute encephalopathy 03/29/2015   Cardiac arrhythmia 12/13/2012   Per pt, takes atenolol, pradaxa. asymptomatic.    Chronic atrial fibrillation    Chronic renal insufficiency, stage III (moderate) (HCC)    CKD (chronic kidney disease) stage 3, GFR 30-59 ml/min (HCC) 09/17/2015   Closed fracture of neck of left femur (HCC)    Confusion 06/20/2015   Constipation    CVA (cerebral infarction) approx 2012   residual defect: poor R sided peripheral vision per son   Dementia with behavioral disturbance ([place]) 11/08/2015   Femoral neck fracture ([place]) 12/01/2015   Hip fracture ([place]) 03/20/2015   History of depression 12/11/2012   Pt reports 40 pound weight loss. No current Tx.    History of fracture of right hip 03/2015   Hyperlipidemia    Hypertension    Hypothyroidism    Insomnia    Osteoarthritis    wrists, hands   Osteoporosis    Urinary incontinence 12/13/2012   Vitamin D deficiency  03/29/2015    Patient Active Problem List   Diagnosis Date Noted   Failure to thrive in adult 07/14/2016   Femoral neck fracture ([place]) 12/01/2015   Malnutrition of moderate degree 12/01/2015   Closed fracture of neck of left femur (HCC)    Mood disorder ([place]) 11/19/2015   Dementia with behavioral disturbance ([place]) 11/08/2015   CKD (chronic kidney disease) stage 3, GFR 30-59 ml/min (HCC) 09/17/2015   Insomnia 08/18/2015   Confusion 06/20/2015   Recent urinary tract infection 06/20/2015   Dysuria 04/10/2015   Edema 04/06/2015   Acute encephalopathy 03/29/2015   Vitamin D deficiency 03/29/2015   UTI (lower urinary tract infection) 03/21/2015   Hip fracture ([place]) 03/20/2015   Chronic atrial fibrillation 02/20/2015   Hyperlipidemia 02/20/2015   Essential hypertension, benign 12/13/2012   Cardiac arrhythmia 12/13/2012   Urinary incontinence 12/13/2012   Hypothyroidism 12/13/2012   History of depression 12/11/2012    Past Surgical History:  Procedure Laterality Date   APPENDECTOMY     COLONOSCOPY  08/22/08   INTRAMEDULLARY (IM) NAIL INTERTROCHANTERIC Right 03/22/2015   Procedure: RIGHT femoral HIP NAILING;  Surgeon: Gaynelle Arabian, MD;  Location: [facility]S;  Service: Orthopedics;  Laterality: Right;   KNEE ARTHROSCOPY Right    THYROID SURGERY       OB History   No obstetric history on file.      Home Medications    Prior to Admission medications  Medication Sig Start Date End Date Taking? Authorizing Provider  acetaminophen (TYLENOL) 325 MG tablet Take 650 mg by mouth 3 (three) times daily.    [provider]  acetaminophen (TYLENOL) 500 MG tablet Take 500 mg by mouth every 6 (six) hours as needed for mild pain, moderate pain or headache.    [provider]  apixaban (ELIQUIS) 2.5 MG TABS tablet Take 2.5 mg by mouth 2 (two) times daily. 9am and 5pm    [provider]  atenolol (TENORMIN) 50 MG tablet Take 50 mg by mouth  daily.    [provider]  Cholecalciferol (VITAMIN D3) 125 MCG (5000 UT) TABS Take by mouth. Give 1 tablet by mouth weekly for Vitamin D def    [provider]  Cranberry 450 MG TABS Take 1 tablet by mouth daily.    [provider]  divalproex (DEPAKOTE SPRINKLE) 125 MG capsule Take 250 mg by mouth 2 (two) times daily. Take 2 capsules to = 250 mg    [provider]  feeding supplement (BOOST / RESOURCE BREEZE) LIQD Take 1 Container by mouth 3 (three) times daily between meals.     [provider]  NUTRITIONAL SUPPLEMENT LIQD Take 60 mLs by mouth 3 (three) times daily. MEDPASS    [provider]  ondansetron (ZOFRAN-ODT) 4 MG disintegrating tablet Take 4 mg by mouth every 8 (eight) hours as needed for nausea or vomiting.    [provider]  senna (SENOKOT) 8.6 MG tablet Take 1 tablet by mouth at bedtime as needed for constipation.     [provider]  tiZANidine (ZANAFLEX) 2 MG tablet Take 2 mg by mouth every 8 (eight) hours as needed for muscle spasms.  04/03/17   [provider]    Family History Family History  Problem Relation Age of Onset   Stroke Mother    Diabetes Mother    Alcohol abuse Father     Social History Social History   Tobacco Use   Smoking status: Never Smoker   Smokeless tobacco: Never Used  Substance Use Topics   Alcohol use: No   Drug use: No     Allergies   Patient has no known allergies.   Review of Systems Review of Systems  Unable to perform ROS: Dementia  Skin: Positive for wound.     Physical Exam Updated Vital Signs BP (!) 151/96    Pulse 86    Temp 98.1 F (36.7 C) (Oral)    Resp 14    LMP 01/08/1972 (LMP Unknown)    SpO2 96%   Physical Exam Vitals signs and nursing note reviewed.  Constitutional:      General: She is not in acute distress.    Appearance: She is well-developed.     Comments: Awake. Pleasantly confused.   HENT:     Head:  Normocephalic and atraumatic.     Comments: No nasal, facial, maxillary or mandibular tenderness.  No scalp tenderness.  No signs of head or facial trauma.    Right Ear: External ear normal.     Left Ear: External ear normal.     Nose: Nose normal.     Mouth/Throat:     Comments: Moist mucous membranes.  No intraoral tongue injury. Eyes:     General: No scleral icterus.    Conjunctiva/sclera: Conjunctivae normal.  Neck:     Musculoskeletal: Normal range of motion and neck supple.     Comments: No cervical collar in place.  No midline or paraspinal muscle tenderness.  Full range of motion of the neck without pain.  Trachea is midline. Cardiovascular:     Rate and Rhythm: Normal rate.     Heart sounds: Normal heart sounds. No murmur.     Comments: 1+ radial and DP pulses bilaterally. Pulmonary:     Effort: Pulmonary effort is normal.     Breath sounds: Normal breath sounds. No wheezing.     Comments: A/P chest wall without tenderness, ecchymosis or signs of trauma.  Normal work of breathing. Abdominal:     General: Abdomen is flat.     Tenderness: There is no abdominal tenderness.  Musculoskeletal: Normal range of motion.        General: Tenderness present.     Comments: Diffuse tenderness to dorsum of right hand, wrist and posterior lateral elbow along lacerations/abrasions although pt has full ROM of fingers, wrist and elbow without pain. No obvious bone deformity.  Skin:    General: Skin is warm and dry.     Capillary Refill: Capillary refill takes less than 2 seconds.     Findings: Abrasion and laceration present.     Comments: Laceration#1: Large approx 12 cm Y shaped irregular laceration with multiple flaps to dorsum of right hand extending down to dorsal wrist, appropriately tender, hemostatic. Deepest part of wound is around dorsal, proximal base of 3rd and 4th metacarpals where there is pearly gray tendenous structure visible without laceration or damage. Local ecchymosis.    Laceration #2: C-shaped laceration to right lateral elbow approx 10 cm, edges are well approximated. There is surrounding ecchymosis.   Neurological:     Mental Status: She is alert and oriented to person, place, and time.     Comments: Neurological exam limited due to underlying dementia.  Alert and oriented to self only. Can tell me she fell when getting onto a chair.  Speech is fluent without dysarthria or dysphasia. Strength 5/5 with hand grip and ankle F/E.   Sensation to light touch intact in face, hands and feet. No pronator drift. No leg drop. CN I and II not tested CN III, IV, VI PEERL and EOMs intact bilaterally CN V light touch intact in all 3 divisions of trigeminal nerve CN VII facial movements symmetric CN VIII not tested CN IX, X no uvula deviation, symmetric rise of soft palate  CN XI 5/5 SCM and trapezius strength bilaterally  CN XII Midline tongue protrusion, symmetric L/R movements  Psychiatric:        Behavior: Behavior normal.        Thought Content: Thought content normal.        Judgment: Judgment normal.      ED Treatments / Results  Labs (all labs ordered are listed, but only abnormal results are displayed) Labs Reviewed - No data to display  EKG None  Radiology Dg Elbow Complete Right  Result Date: 06/22/2018 CLINICAL DATA:  Post fall with laceration to elbow. EXAM: RIGHT ELBOW - COMPLETE 3+ VIEW COMPARISON:  None. FINDINGS: Patient had difficulty tolerating the exam in positioning. No fracture or dislocation. Soft tissue prominence about the elbow with loose skin, known laceration not well-defined. No radiopaque foreign body. No joint effusion. Mild osteoarthritis. IMPRESSION: No fracture, dislocation, or radiopaque foreign body. Electronically Signed   By: Keith Rake M.D.   On: 06/22/2018 22:15   Dg Forearm Right  Result Date: 06/22/2018 CLINICAL DATA:  Fall with laceration to forearm/elbow. EXAM: RIGHT FOREARM - 2 VIEW COMPARISON:  None.  FINDINGS: No acute fracture. Remote fracture of the distal radius and ulna. Known laceration is not well characterized, soft tissue prominence about the proximal forearm with loose skin. No radiopaque foreign body. IMPRESSION: Known laceration not well characterized radiographically. No radiopaque foreign body or acute osseous abnormality. Electronically Signed   By: Keith Rake M.D.   On: 06/22/2018 22:14   Ct Head Wo Contrast  Result Date: 06/22/2018 CLINICAL DATA:  Fall while on anticoagulation with Eliquis. Dementia. EXAM: CT HEAD WITHOUT CONTRAST CT CERVICAL SPINE WITHOUT CONTRAST TECHNIQUE: Multidetector CT imaging of the head and cervical spine was performed following the standard protocol without intravenous contrast. Multiplanar CT image reconstructions of the cervical spine were also generated. COMPARISON:  CT head and cervical spine 01/09/2016 FINDINGS: CT HEAD FINDINGS Brain: There is no mass, hemorrhage or extra-axial collection. There is generalized atrophy without lobar predilection. Old left occipital lobe infarct. There is periventricular hypoattenuation compatible with chronic microvascular disease. Vascular: Atherosclerotic calcification of the internal carotid arteries at the skull base. No abnormal hyperdensity of the major intracranial arteries or dural venous sinuses. Skull: The visualized skull base, calvarium and extracranial soft tissues are normal. Sinuses/Orbits: Left mastoid effusion. Paranasal sinuses are clear. The orbits are normal. CT CERVICAL SPINE FINDINGS Alignment: C4-5 grade 1 anterolisthesis due to facet hypertrophy. Skull base and vertebrae: No acute fracture. Soft tissues and spinal canal: No prevertebral fluid or swelling. No visible canal hematoma. Disc levels: Severe facet hypertrophy at the C2-5 levels. No bony spinal canal stenosis. Upper chest: No pneumothorax, pulmonary nodule or pleural effusion. Other: Markedly enlarged thyroid gland, heterogeneous  bilaterally, right greater than left. IMPRESSION: 1. No acute intracranial abnormality. 2. Chronic ischemic microangiopathy and generalized atrophy. 3. No acute fracture of cervical spine. 4. Grade 1 C4-5 anterolisthesis secondary to facet arthrosis. Electronically Signed   By: Ulyses Jarred M.D.   On: 06/22/2018 22:23   Ct Cervical Spine Wo Contrast  Result Date: 06/22/2018 CLINICAL DATA:  Fall while on anticoagulation with Eliquis. Dementia. EXAM: CT HEAD WITHOUT CONTRAST CT CERVICAL SPINE WITHOUT CONTRAST TECHNIQUE: Multidetector CT imaging of the head and cervical spine was performed following the standard protocol without intravenous contrast. Multiplanar CT image reconstructions of the cervical spine were also generated. COMPARISON:  CT head and cervical spine 01/09/2016 FINDINGS: CT HEAD FINDINGS Brain: There is no mass, hemorrhage or extra-axial collection. There is generalized atrophy without lobar predilection. Old left occipital lobe infarct. There is periventricular hypoattenuation compatible with chronic microvascular disease. Vascular: Atherosclerotic calcification of the internal carotid arteries at the skull base. No abnormal hyperdensity of the major intracranial arteries or dural venous sinuses. Skull: The visualized skull base, calvarium and extracranial soft tissues are normal. Sinuses/Orbits: Left mastoid effusion. Paranasal sinuses are clear. The orbits are normal. CT CERVICAL SPINE FINDINGS Alignment: C4-5 grade 1 anterolisthesis due to facet hypertrophy. Skull base and vertebrae: No acute fracture. Soft tissues and spinal canal: No prevertebral fluid or swelling. No visible canal hematoma. Disc levels: Severe facet hypertrophy at the C2-5 levels. No bony spinal canal stenosis. Upper chest: No pneumothorax, pulmonary nodule or pleural effusion. Other: Markedly enlarged thyroid gland, heterogeneous bilaterally, right greater than left. IMPRESSION: 1. No acute intracranial abnormality. 2.  Chronic ischemic microangiopathy and generalized atrophy. 3. No acute fracture of cervical spine. 4. Grade 1 C4-5 anterolisthesis secondary to facet arthrosis. Electronically Signed   By: Ulyses Jarred M.D.   On: 06/22/2018 22:23   Dg Hand Complete Right  Result Date: 06/22/2018 CLINICAL DATA:  Fall with large laceration to dorsum of hand. EXAM: RIGHT HAND - COMPLETE 3+ VIEW COMPARISON:  None. FINDINGS: Patient had difficulty tolerating the exam. No acute fracture. Air in the soft tissues overlying the dorsum of the hand consistent with laceration. No radiopaque foreign body. Advanced multifocal joint space narrowing, osteophytes, with Gull wing deformities of the digits as well as mild subluxation of the digits at the metacarpal phalangeal joints. Advanced osteoarthritis of the base of the thumb. Disruption of the distal radioulnar joint which appears chronic. Vascular calcifications. IMPRESSION: 1. Air in the soft tissues overlying the dorsum of the hand consistent with laceration. No radiopaque foreign body or acute osseous abnormality. 2. Advanced multifocal osteoarthritis. A component of erosive osteoarthritis is also considered. Electronically Signed   By: Keith Rake M.D.   On: 06/22/2018 22:12    Procedures .Marland KitchenLaceration Repair  Date/Time: 06/23/2018 2:06 PM Performed by: Kinnie Feil, PA-C Authorized by: Kinnie Feil, PA-C   Consent:    Consent obtained:  Verbal   Consent given by:  Patient and guardian   Risks discussed:  Infection, need for additional repair, pain, poor cosmetic result and poor wound healing   Alternatives discussed:  No treatment and delayed treatment Universal protocol:    Procedure explained and questions answered to patient or proxy's satisfaction: yes     Relevant documents present and verified: yes     Test results available and properly labeled: yes     Imaging studies available: yes     Required blood products, implants, devices, and special  equipment available: yes     Site/side marked: yes     Immediately prior to procedure, a time out was called: yes     Patient identity confirmed:  Arm band Anesthesia (see MAR for exact dosages):    Anesthesia method:  Local infiltration   Local anesthetic:  Lidocaine 2% WITH epi Laceration details:    Location:  Hand   Hand location:  R hand, dorsum   Length (cm):  12 Repair type:    Repair type:  Complex Pre-procedure details:    Preparation:  Patient was prepped and draped in usual sterile fashion and imaging obtained to evaluate for foreign bodies Exploration:    Limited defect created (wound extended): no     Hemostasis achieved with:  Epinephrine and direct pressure   Wound exploration: wound explored through full range of motion and entire depth of wound probed and visualized     Wound extent: no nerve damage noted, no tendon damage noted and no underlying fracture noted     Contaminated: no   Treatment:    Area cleansed with:  Betadine and saline   Amount of cleaning:  Extensive   Irrigation method:  Pressure wash and tap   Visualized foreign bodies/material removed: no     Debridement:  Minimal   Undermining:  None   Scar revision: no   Skin repair:    Repair method:  Sutures   Suture size:  5-0   Wound skin closure material used: ethilon.   Suture technique:  Simple interrupted   Number of sutures:  8 Approximation:    Approximation:  Loose Post-procedure details:    Dressing:  Non-adherent dressing and bulky dressing   Patient tolerance of procedure:  Tolerated well, no immediate complications Comments:     Repair of this laceration was difficult due to thin skin and irregularity. The center part along dorsum of 3rd/4th metacarpals had a large defect and skin  was not able to be stretched to completely close it. Distal irregular part of this laceration was repaired loosely with 8 ethilon simple interrupted sutures. Proximal section of the wound had well approximated  edges an was able to be repaired with dermabond. Marland Kitchen.Laceration Repair  Date/Time: 06/23/2018 2:14 PM Performed by: Kinnie Feil, PA-C Authorized by: Kinnie Feil, PA-C   Consent:    Consent obtained:  Verbal   Consent given by:  Patient   Risks discussed:  Infection, need for additional repair, pain, poor cosmetic result and poor wound healing   Alternatives discussed:  No treatment and delayed treatment Universal protocol:    Procedure explained and questions answered to patient or proxy's satisfaction: yes     Relevant documents present and verified: yes     Test results available and properly labeled: yes     Imaging studies available: yes     Required blood products, implants, devices, and special equipment available: yes     Site/side marked: yes     Immediately prior to procedure, a time out was called: yes     Patient identity confirmed:  Arm band Anesthesia (see MAR for exact dosages):    Anesthesia method:  Local infiltration   Local anesthetic:  Lidocaine 2% WITH epi Laceration details:    Location:  Shoulder/arm   Shoulder/arm location:  R elbow   Length (cm):  10 Repair type:    Repair type:  Intermediate Pre-procedure details:    Preparation:  Patient was prepped and draped in usual sterile fashion and imaging obtained to evaluate for foreign bodies Exploration:    Hemostasis achieved with:  Epinephrine and direct pressure   Wound exploration: wound explored through full range of motion and entire depth of wound probed and visualized     Wound extent: no nerve damage noted, no tendon damage noted, no underlying fracture noted and no vascular damage noted   Treatment:    Area cleansed with:  Betadine   Amount of cleaning:  Extensive   Irrigation solution:  Sterile saline   Irrigation method:  Pressure wash Skin repair:    Repair method:  Tissue adhesive Approximation:    Approximation:  Close Post-procedure details:    Dressing:  Non-adherent  dressing and bulky dressing   Patient tolerance of procedure:  Tolerated well, no immediate complications   (including critical care time)  Medications Ordered in ED Medications  lidocaine-EPINEPHrine (XYLOCAINE W/EPI) 2 %-1:200000 (PF) injection 20 mL (20 mLs Infiltration Given 06/22/18 2135)  Tdap (BOOSTRIX) injection 0.5 mL (0.5 mLs Intramuscular Given 06/23/18 0027)     Initial Impression / Assessment and Plan / ED Course  I have reviewed the triage vital signs and the nursing notes.  Pertinent labs & imaging results that were available during my care of the patient were reviewed by me and considered in my medical decision making (see chart for details).  Clinical Course as of Jun 23 1414  Tue Jun 23, 2018  0025 Contacted son Marie Robertson and updated on ER POC and discharge instructions. Verbalized understanding and ok with plan.    [CG]    Clinical Course User Index [CG] Kinnie Feil, PA-C      85 yo presents after mechanical witnessed fall with right hand and elbow lacerations. She has no other physical complaints. On eliquis.   Exam reveals large, complex laceration to right dorsal hand and elbow without neuro, vascular or MSK deficits.  Exam reveals no other significant head, CTL spine, pelvis  or other extremity injury. Neuro exam limited due to dementia but grossly normal.   Imaging without acute abnormalities. No associated FB or underlying fractures to right upper extremity.   Lacerations repaired in ER with sutures and dermabon.  Laceration repair very difficult due to thin skin and Eliquis, edges were approximated as well as possible. At the end of the procedure wound was hemostatic. Dressing applied by me.   Spoke to patient' son to give update regarding ER POC. Discussed complex laceration repair, wound care instructions and need for close follow up for wound check. He verbalized appreciation and agreement. VSS, tolerating PO, no clinical decline while in ER.  Appropriate for DC back to SNF. Return precautions given in AVS.  Final Clinical Impressions(s) / ED Diagnoses   Final diagnoses:  Fall, initial encounter  Laceration of right hand without foreign body, initial encounter  Laceration of right elbow, initial encounter    ED Discharge Orders    None       Arlean Hopping 06/23/18 1416    Virgel Manifold, MD 06/23/18 1502

## 2018-06-22 NOTE — ED Notes (Signed)
Transported to radiology 

## 2018-06-23 DIAGNOSIS — S61411A Laceration without foreign body of right hand, initial encounter: Secondary | ICD-10-CM | POA: Diagnosis not present

## 2018-06-23 DIAGNOSIS — R456 Violent behavior: Secondary | ICD-10-CM | POA: Diagnosis not present

## 2018-06-23 DIAGNOSIS — R279 Unspecified lack of coordination: Secondary | ICD-10-CM | POA: Diagnosis not present

## 2018-06-23 DIAGNOSIS — Z743 Need for continuous supervision: Secondary | ICD-10-CM | POA: Diagnosis not present

## 2018-06-23 DIAGNOSIS — F29 Unspecified psychosis not due to a substance or known physiological condition: Secondary | ICD-10-CM | POA: Diagnosis not present

## 2018-06-23 DIAGNOSIS — R410 Disorientation, unspecified: Secondary | ICD-10-CM | POA: Diagnosis not present

## 2018-06-23 NOTE — Discharge Instructions (Addendum)
Ms Marie Robertson was seen in the ER for fall and right hand and elbow laceration.   Lacerations were large, complex and skin was very thin. Edges were approximated as best possible. There will be a large scar given the nature of the injury.l  Sutures should remain in place for at least 7-10 days. Keep original dressing on for the next 24 hours. Change dressing in 24 hours with xeroform along the sutured wound and non adherent dressing elsewhere. Keep covered until a scab forms. Monitor for signs of infection including redness, warmth, pus, fevers.

## 2018-06-25 DIAGNOSIS — R41841 Cognitive communication deficit: Secondary | ICD-10-CM | POA: Diagnosis not present

## 2018-06-25 DIAGNOSIS — Z9181 History of falling: Secondary | ICD-10-CM | POA: Diagnosis not present

## 2018-06-25 DIAGNOSIS — Z1383 Encounter for screening for respiratory disorder NEC: Secondary | ICD-10-CM | POA: Diagnosis not present

## 2018-06-25 DIAGNOSIS — M6281 Muscle weakness (generalized): Secondary | ICD-10-CM | POA: Diagnosis not present

## 2018-06-26 DIAGNOSIS — I482 Chronic atrial fibrillation, unspecified: Secondary | ICD-10-CM | POA: Diagnosis not present

## 2018-06-26 DIAGNOSIS — R41841 Cognitive communication deficit: Secondary | ICD-10-CM | POA: Diagnosis not present

## 2018-06-26 DIAGNOSIS — M6281 Muscle weakness (generalized): Secondary | ICD-10-CM | POA: Diagnosis not present

## 2018-06-26 DIAGNOSIS — Z9181 History of falling: Secondary | ICD-10-CM | POA: Diagnosis not present

## 2018-06-30 DIAGNOSIS — R41841 Cognitive communication deficit: Secondary | ICD-10-CM | POA: Diagnosis not present

## 2018-06-30 DIAGNOSIS — Z9181 History of falling: Secondary | ICD-10-CM | POA: Diagnosis not present

## 2018-06-30 DIAGNOSIS — M6281 Muscle weakness (generalized): Secondary | ICD-10-CM | POA: Diagnosis not present

## 2018-07-01 DIAGNOSIS — Z9181 History of falling: Secondary | ICD-10-CM | POA: Diagnosis not present

## 2018-07-01 DIAGNOSIS — R41841 Cognitive communication deficit: Secondary | ICD-10-CM | POA: Diagnosis not present

## 2018-07-01 DIAGNOSIS — M6281 Muscle weakness (generalized): Secondary | ICD-10-CM | POA: Diagnosis not present

## 2018-07-03 DIAGNOSIS — Z9181 History of falling: Secondary | ICD-10-CM | POA: Diagnosis not present

## 2018-07-03 DIAGNOSIS — M6281 Muscle weakness (generalized): Secondary | ICD-10-CM | POA: Diagnosis not present

## 2018-07-03 DIAGNOSIS — R41841 Cognitive communication deficit: Secondary | ICD-10-CM | POA: Diagnosis not present

## 2018-07-03 DIAGNOSIS — Z20828 Contact with and (suspected) exposure to other viral communicable diseases: Secondary | ICD-10-CM | POA: Diagnosis not present

## 2018-07-05 DIAGNOSIS — R41841 Cognitive communication deficit: Secondary | ICD-10-CM | POA: Diagnosis not present

## 2018-07-05 DIAGNOSIS — M6281 Muscle weakness (generalized): Secondary | ICD-10-CM | POA: Diagnosis not present

## 2018-07-05 DIAGNOSIS — Z9181 History of falling: Secondary | ICD-10-CM | POA: Diagnosis not present

## 2018-07-07 DIAGNOSIS — Z9181 History of falling: Secondary | ICD-10-CM | POA: Diagnosis not present

## 2018-07-07 DIAGNOSIS — M6281 Muscle weakness (generalized): Secondary | ICD-10-CM | POA: Diagnosis not present

## 2018-07-07 DIAGNOSIS — R41841 Cognitive communication deficit: Secondary | ICD-10-CM | POA: Diagnosis not present

## 2018-07-08 DIAGNOSIS — M6281 Muscle weakness (generalized): Secondary | ICD-10-CM | POA: Diagnosis not present

## 2018-07-08 DIAGNOSIS — F419 Anxiety disorder, unspecified: Secondary | ICD-10-CM | POA: Diagnosis not present

## 2018-07-08 DIAGNOSIS — F0391 Unspecified dementia with behavioral disturbance: Secondary | ICD-10-CM | POA: Diagnosis not present

## 2018-07-08 DIAGNOSIS — Z9181 History of falling: Secondary | ICD-10-CM | POA: Diagnosis not present

## 2018-07-09 DIAGNOSIS — Z9181 History of falling: Secondary | ICD-10-CM | POA: Diagnosis not present

## 2018-07-09 DIAGNOSIS — M6281 Muscle weakness (generalized): Secondary | ICD-10-CM | POA: Diagnosis not present

## 2018-07-10 DIAGNOSIS — Z9181 History of falling: Secondary | ICD-10-CM | POA: Diagnosis not present

## 2018-07-10 DIAGNOSIS — M6281 Muscle weakness (generalized): Secondary | ICD-10-CM | POA: Diagnosis not present

## 2018-07-13 DIAGNOSIS — Z9181 History of falling: Secondary | ICD-10-CM | POA: Diagnosis not present

## 2018-07-13 DIAGNOSIS — M6281 Muscle weakness (generalized): Secondary | ICD-10-CM | POA: Diagnosis not present

## 2018-07-14 DIAGNOSIS — M6281 Muscle weakness (generalized): Secondary | ICD-10-CM | POA: Diagnosis not present

## 2018-07-14 DIAGNOSIS — Z9181 History of falling: Secondary | ICD-10-CM | POA: Diagnosis not present

## 2018-07-15 DIAGNOSIS — Z9181 History of falling: Secondary | ICD-10-CM | POA: Diagnosis not present

## 2018-07-15 DIAGNOSIS — Z20828 Contact with and (suspected) exposure to other viral communicable diseases: Secondary | ICD-10-CM | POA: Diagnosis not present

## 2018-07-15 DIAGNOSIS — M6281 Muscle weakness (generalized): Secondary | ICD-10-CM | POA: Diagnosis not present

## 2018-07-16 DIAGNOSIS — M6281 Muscle weakness (generalized): Secondary | ICD-10-CM | POA: Diagnosis not present

## 2018-07-16 DIAGNOSIS — Z9181 History of falling: Secondary | ICD-10-CM | POA: Diagnosis not present

## 2018-07-17 DIAGNOSIS — M6281 Muscle weakness (generalized): Secondary | ICD-10-CM | POA: Diagnosis not present

## 2018-07-17 DIAGNOSIS — Z9181 History of falling: Secondary | ICD-10-CM | POA: Diagnosis not present

## 2018-07-20 DIAGNOSIS — M6281 Muscle weakness (generalized): Secondary | ICD-10-CM | POA: Diagnosis not present

## 2018-07-20 DIAGNOSIS — Z9181 History of falling: Secondary | ICD-10-CM | POA: Diagnosis not present

## 2018-07-21 DIAGNOSIS — Z9181 History of falling: Secondary | ICD-10-CM | POA: Diagnosis not present

## 2018-07-21 DIAGNOSIS — M6281 Muscle weakness (generalized): Secondary | ICD-10-CM | POA: Diagnosis not present

## 2018-07-24 ENCOUNTER — Non-Acute Institutional Stay (SKILLED_NURSING_FACILITY): Payer: Medicare Other | Admitting: Internal Medicine

## 2018-07-24 DIAGNOSIS — B999 Unspecified infectious disease: Secondary | ICD-10-CM

## 2018-07-24 DIAGNOSIS — U071 COVID-19: Secondary | ICD-10-CM

## 2018-07-26 ENCOUNTER — Encounter: Payer: Self-pay | Admitting: Internal Medicine

## 2018-07-26 DIAGNOSIS — U071 COVID-19: Secondary | ICD-10-CM | POA: Insufficient documentation

## 2018-07-26 NOTE — Progress Notes (Signed)
Location:  [facility] of Service:   SNF  [name], MD  Patient Care Team: [name], MD as PCP - General (Internal Medicine)  Extended Emergency Contact Information Primary Emergency Contact: [facility] Address: [address]          [place], [place] [address] Johnnette Litter of [place] Phone: [phone] Relation: Son Secondary Emergency Contact: [place], [place] [address] Johnnette Litter of [place] Phone: [phone] Relation: Son    Allergies: Patient has no known allergies.  Chief Complaint  Patient presents with  . Acute Visit    HPI: Patient is 85 y.o. female who being seen because she is positive for COVID and she is in isolation and I am following up on her.  At this point she is asymptomatic.  She was exposed in her COVID test came back positive in the first round.  Patient has no complaints.  Past Medical History:  Diagnosis Date  . Acute encephalopathy 03/29/2015  . Cardiac arrhythmia 12/13/2012   Per pt, takes atenolol, pradaxa. asymptomatic.   Marland Kitchen Chronic atrial fibrillation   . Chronic renal insufficiency, stage III (moderate) (HCC)   . CKD (chronic kidney disease) stage 3, GFR 30-59 ml/min (HCC) 09/17/2015  . Closed fracture of neck of left femur ([place])   . Confusion 06/20/2015  . Constipation   . CVA (cerebral infarction) approx 2012   residual defect: poor R sided peripheral vision per son  . Dementia with behavioral disturbance ([place]) 11/08/2015  . Femoral neck fracture ([place]) 12/01/2015  . Hip fracture ([place]) 03/20/2015  . History of depression 12/11/2012   Pt reports 40 pound weight loss. No current Tx.   Marland Kitchen History of fracture of right hip 03/2015  . Hyperlipidemia   . Hypertension   . Hypothyroidism   . Insomnia   . Osteoarthritis    wrists, hands  . Osteoporosis   . Urinary incontinence 12/13/2012  . Vitamin D deficiency 03/29/2015    Past Surgical History:  Procedure  Laterality Date  . APPENDECTOMY    . COLONOSCOPY  08/22/08  . INTRAMEDULLARY (IM) NAIL INTERTROCHANTERIC Right 03/22/2015   Procedure: RIGHT femoral HIP NAILING;  Surgeon: Gaynelle Arabian, MD;  Location: [facility];  Service: Orthopedics;  Laterality: Right;  . KNEE ARTHROSCOPY Right   . THYROID SURGERY      Allergies as of 07/24/2018   No Known Allergies     Medication List       Accurate as of July 24, 2018 11:59 PM. If you have any questions, ask your nurse or doctor.        acetaminophen 325 MG tablet Commonly known as: TYLENOL Take 650 mg by mouth 3 (three) times daily.   acetaminophen 500 MG tablet Commonly known as: TYLENOL Take 500 mg by mouth every 6 (six) hours as needed for mild pain, moderate pain or headache.   atenolol 50 MG tablet Commonly known as: TENORMIN Take 50 mg by mouth daily.   Cranberry 450 MG Tabs Take 1 tablet by mouth daily.   divalproex 125 MG capsule Commonly known as: DEPAKOTE SPRINKLE Take 250 mg by mouth 2 (two) times daily. Take 2 capsules to = 250 mg   Eliquis 2.5 MG Tabs tablet Generic drug: apixaban Take 2.5 mg by mouth 2 (two) times daily. 9am and 5pm   feeding supplement Liqd Take 1 Container by mouth 3 (three) times daily between  meals.   Nutritional Supplement Liqd Take 60 mLs by mouth 3 (three) times daily. MEDPASS   ondansetron 4 MG disintegrating tablet Commonly known as: ZOFRAN-ODT Take 4 mg by mouth every 8 (eight) hours as needed for nausea or vomiting.   senna 8.6 MG tablet Commonly known as: SENOKOT Take 1 tablet by mouth at bedtime as needed for constipation.   tiZANidine 2 MG tablet Commonly known as: ZANAFLEX Take 2 mg by mouth every 8 (eight) hours as needed for muscle spasms.   Vitamin D3 125 MCG (5000 UT) Tabs Take by mouth. Give 1 tablet by mouth weekly for Vitamin D def       No orders of the defined types were placed in this encounter.   Immunization History  Administered Date(s) Administered  .  Influenza Split 11/20/2012  . Influenza-Unspecified 10/07/2015, 10/18/2016  . PPD Test 08/14/2015  . Pneumococcal Conjugate-13 05/22/2015  . Pneumococcal Polysaccharide-23 08/22/2008  . Tdap 01/01/2015, 08/06/2015, 01/10/2016, 06/23/2018    Social History   Tobacco Use  . Smoking status: Never Smoker  . Smokeless tobacco: Never Used  Substance Use Topics  . Alcohol use: No    Review of Systems  DATA OBTAINED: from nurse GENERAL:  no fevers, fatigue, appetite changes SKIN: No itching, rash HEENT: No complaint RESPIRATORY: No cough, wheezing, SOB CARDIAC: No chest pain, palpitations, lower extremity edema  GI: No abdominal pain, No N/V/D or constipation, No heartburn or reflux  GU: No dysuria, frequency or urgency, or incontinence  MUSCULOSKELETAL: No unrelieved bone/joint pain NEUROLOGIC: No headache, dizziness  PSYCHIATRIC: No overt anxiety or sadness  Vitals:   07/26/18 2326  BP: 110/64  Pulse: 95  Resp: 16  Temp: 98 F (36.7 C)   Body mass index is 17.23 kg/m. Physical Exam  GENERAL APPEARANCE:  No acute distress  SKIN: No diaphoresis rash HEENT: Unremarkable RESPIRATORY: Breathing is even, unlabored. Lung sounds are clear   CARDIOVASCULAR: Heart RRR no murmurs, rubs or gallops. No peripheral edema  GASTROINTESTINAL: Abdomen is soft, non-tender, not distended w/ normal bowel sounds.  GENITOURINARY: Bladder non tender, not distended  MUSCULOSKELETAL: No abnormal joints or musculature NEUROLOGIC: Cranial nerves 2-12 grossly intact. Moves all extremities PSYCHIATRIC: Mood and affect with dementia, no behavioral issues  Patient Active Problem List   Diagnosis Date Noted  . Failure to thrive in adult 07/14/2016  . Femoral neck fracture ([place]) 12/01/2015  . Malnutrition of moderate degree 12/01/2015  . Closed fracture of neck of left femur ([place])   . Mood disorder ([place]) 11/19/2015  . Dementia with behavioral disturbance ([place]) 11/08/2015  . CKD (chronic kidney  disease) stage 3, GFR 30-59 ml/min (HCC) 09/17/2015  . Insomnia 08/18/2015  . Confusion 06/20/2015  . Recent urinary tract infection 06/20/2015  . Dysuria 04/10/2015  . Edema 04/06/2015  . Acute encephalopathy 03/29/2015  . Vitamin D deficiency 03/29/2015  . UTI (lower urinary tract infection) 03/21/2015  . Hip fracture ([place]) 03/20/2015  . Chronic atrial fibrillation 02/20/2015  . Hyperlipidemia 02/20/2015  . Essential hypertension, benign 12/13/2012  . Cardiac arrhythmia 12/13/2012  . Urinary incontinence 12/13/2012  . Hypothyroidism 12/13/2012  . History of depression 12/11/2012    CMP     Component Value Date/Time   NA 144 10/13/2017   K 4.0 10/13/2017   CL 103 01/10/2016 0030   CO2 23 01/10/2016 0030   GLUCOSE 111 (H) 01/10/2016 0030   BUN 37 (A) 10/13/2017   CREATININE 1.3 (A) 10/13/2017   CREATININE 1.26 (H) 01/10/2016 0030  CALCIUM 8.6 (L) 01/10/2016 0030   PROT 6.9 12/01/2015 0506   ALBUMIN 3.6 12/01/2015 0506   AST 16 10/13/2017   ALT 9 10/13/2017   ALKPHOS 68 10/13/2017   BILITOT 0.6 12/01/2015 0506   GFRNONAA 36 (L) 01/10/2016 0030   GFRAA 41 (L) 01/10/2016 0030   Recent Labs    10/13/17  NA 144  K 4.0  BUN 37*  CREATININE 1.3*   Recent Labs    10/13/17  AST 16  ALT 9  ALKPHOS 68   Recent Labs    10/13/17 05/10/18  WBC 5.2 5.1  HGB 10.9* 11.2*  HCT 33* 35*  PLT 218 224   Recent Labs    10/13/17  CHOL 131  LDLCALC 67  TRIG 73   No results found for: MICROALBUR Lab Results  Component Value Date   TSH 0.32 (A) 04/08/2018   Lab Results  Component Value Date   HGBA1C 5.6 10/13/2017   Lab Results  Component Value Date   CHOL 131 10/13/2017   HDL 49 10/13/2017   LDLCALC 67 10/13/2017   TRIG 73 10/13/2017    Significant Diagnostic Results in last 30 days:  No results found.  Assessment and Plan  COVID-19 positive/airborne isolation-patient is in isolation secondary to positive COVID test but she is fortunately asymptomatic;  she is on the "cobra cocktail" which includes vitamin C, vitamin D, zinc and patient is already on Eliquis show she is not on aspirin.  Continue supportive care    [name], MD

## 2018-07-31 ENCOUNTER — Non-Acute Institutional Stay (SKILLED_NURSING_FACILITY): Payer: Medicare Other | Admitting: Internal Medicine

## 2018-07-31 ENCOUNTER — Encounter: Payer: Self-pay | Admitting: Internal Medicine

## 2018-07-31 DIAGNOSIS — U071 COVID-19: Secondary | ICD-10-CM | POA: Diagnosis not present

## 2018-07-31 DIAGNOSIS — R063 Periodic breathing: Secondary | ICD-10-CM | POA: Diagnosis not present

## 2018-07-31 DIAGNOSIS — R627 Adult failure to thrive: Secondary | ICD-10-CM | POA: Diagnosis not present

## 2018-07-31 DIAGNOSIS — R4189 Other symptoms and signs involving cognitive functions and awareness: Secondary | ICD-10-CM

## 2018-07-31 MED ORDER — MORPHINE SULFATE (CONCENTRATE) 20 MG/ML PO SOLN: 5.0000 mg | ORAL | 0 refills | Status: AC | PRN

## 2018-07-31 NOTE — Progress Notes (Signed)
Location:  [facility] Room Number: 422-D Place of Service:  SNF (31)SNF  [name], MD  Patient Care Team: [name], MD as PCP - General (Internal Medicine)  Extended Emergency Contact Information Primary Emergency Contact: [facility] Address: [address]          [place], [place] [address] Johnnette Litter of [place] Phone: [phone] Relation: Son Secondary Emergency Contact: [place], [place] [address] Johnnette Litter of [place] Phone: [phone] Relation: Son    Allergies: Patient has no known allergies.  Chief Complaint  Patient presents with  . Acute Visit    Patient is transitioning.    HPI: Patient is 85 y.o. female who nursing asked me to see because she looks like she was dying.  In the room patient was not responsive and was not breathing but did have heart tones.  About 15 seconds later she started to Cheyne-Stokes breathing.  She does look like she is transitioning.  She is COVID positive but has been asymptomatic except for she has not been eating and drinking well, but she was not eating and drinking well prior to becoming COVID positive.  Past Medical History:  Diagnosis Date  . Acute encephalopathy 03/29/2015  . Cardiac arrhythmia 12/13/2012   Per pt, takes atenolol, pradaxa. asymptomatic.   Marland Kitchen Chronic atrial fibrillation   . Chronic renal insufficiency, stage III (moderate) (HCC)   . CKD (chronic kidney disease) stage 3, GFR 30-59 ml/min (HCC) 09/17/2015  . Closed fracture of neck of left femur ([place])   . Confusion 06/20/2015  . Constipation   . CVA (cerebral infarction) approx 2012   residual defect: poor R sided peripheral vision per son  . Dementia with behavioral disturbance ([place]) 11/08/2015  . Femoral neck fracture ([place]) 12/01/2015  . Hip fracture ([place]) 03/20/2015  . History of depression 12/11/2012   Pt reports 40 pound weight loss. No current Tx.   Marland Kitchen History of fracture  of right hip 03/2015  . Hyperlipidemia   . Hypertension   . Hypothyroidism   . Insomnia   . Osteoarthritis    wrists, hands  . Osteoporosis   . Urinary incontinence 12/13/2012  . Vitamin D deficiency 03/29/2015    Past Surgical History:  Procedure Laterality Date  . APPENDECTOMY    . COLONOSCOPY  08/22/08  . INTRAMEDULLARY (IM) NAIL INTERTROCHANTERIC Right 03/22/2015   Procedure: RIGHT femoral HIP NAILING;  Surgeon: Gaynelle Arabian, MD;  Location: [facility];  Service: Orthopedics;  Laterality: Right;  . KNEE ARTHROSCOPY Right   . THYROID SURGERY      Allergies as of 07/31/2018   No Known Allergies     Medication List       Accurate as of July 31, 2018 11:59 PM. If you have any questions, ask your nurse or doctor.        acetaminophen 325 MG tablet Commonly known as: TYLENOL Take 650 mg by mouth 3 (three) times daily.   acetaminophen 500 MG tablet Commonly known as: TYLENOL Take 1,000 mg by mouth every 6 (six) hours as needed for mild pain, moderate pain or headache.   atenolol 50 MG tablet Commonly known as: TENORMIN Take 50 mg by mouth daily.   Cranberry 450 MG Tabs Take 1 tablet by mouth daily.   divalproex 125 MG capsule Commonly known as: DEPAKOTE SPRINKLE Take 250 mg by mouth 2 (two) times daily. Take 2 capsules  to = 250 mg   Eliquis 2.5 MG Tabs tablet Generic drug: apixaban Take 2.5 mg by mouth 2 (two) times daily. 9am and 5pm   feeding supplement Liqd Take 1 Container by mouth 3 (three) times daily between meals.   Nutritional Supplement Liqd Take 60 mLs by mouth 3 (three) times daily. MEDPASS   morphine 20 MG/ML concentrated solution Commonly known as: ROXANOL Take 0.25 mLs (5 mg total) by mouth every 4 (four) hours as needed for anxiety or shortness of breath. Started by: Inocencio Homes, MD   ondansetron 4 MG disintegrating tablet Commonly known as: ZOFRAN-ODT Take 4 mg by mouth every 8 (eight) hours as needed for nausea or vomiting.   senna 8.6 MG  tablet Commonly known as: SENOKOT Take 1 tablet by mouth at bedtime as needed for constipation.   tiZANidine 2 MG tablet Commonly known as: ZANAFLEX Take 2 mg by mouth every 8 (eight) hours as needed for muscle spasms.   vitamin C 1000 MG tablet Take 1,000 mg by mouth daily.   Vitamin D3 125 MCG (5000 UT) Tabs Take by mouth. Give 1 tablet by mouth weekly for Vitamin D def   zinc sulfate 220 (50 Zn) MG capsule Take 220 mg by mouth daily.       Meds ordered this encounter  Medications  . morphine (ROXANOL) 20 MG/ML concentrated solution    Sig: Take 0.25 mLs (5 mg total) by mouth every 4 (four) hours as needed for anxiety or shortness of breath.    Dispense:  30 mL    Refill:  0    Immunization History  Administered Date(s) Administered  . Influenza Split 11/20/2012  . Influenza-Unspecified 10/07/2015, 10/18/2016  . PPD Test 08/14/2015  . Pneumococcal Conjugate-13 05/22/2015  . Pneumococcal Polysaccharide-23 08/22/2008  . Tdap 01/01/2015, 08/06/2015, 01/10/2016, 06/23/2018    Social History   Tobacco Use  . Smoking status: Never Smoker  . Smokeless tobacco: Never Used  Substance Use Topics  . Alcohol use: No    Review of Systems   unable to obtain secondary to patient condition; nursing- as per history of present illness    Vitals:   07/31/18 1418  BP: 122/69  Pulse: 95  Resp: 17  Temp: 98 F (36.7 C)   Body mass index is 16.79 kg/m. Physical Exam  GENERAL APPEARANCE: Unresponsive SKIN: No diaphoresis rash HEENT: Unremarkable RESPIRATORY: Cheyne-Stokes breathing; lungs are clear   CARDIOVASCULAR: Heart RRR no murmurs, rubs or gallops. No peripheral edema  GASTROINTESTINAL: Abdomen is soft, non-tender, not distended w/ normal bowel sounds.  GENITOURINARY: Bladder non tender, not distended  MUSCULOSKELETAL: No abnormal joints or musculature NEUROLOGIC: Unresponsive PSYCHIATRIC: Not applicable   Patient Active Problem List   Diagnosis Date Noted   . Real time reverse transcriptase PCR positive for COVID-19 virus 07/26/2018  . Failure to thrive in adult 07/14/2016  . Femoral neck fracture ([place]) 12/01/2015  . Malnutrition of moderate degree 12/01/2015  . Closed fracture of neck of left femur ([place])   . Mood disorder ([place]) 11/19/2015  . Dementia with behavioral disturbance ([place]) 11/08/2015  . CKD (chronic kidney disease) stage 3, GFR 30-59 ml/min (HCC) 09/17/2015  . Insomnia 08/18/2015  . Confusion 06/20/2015  . Recent urinary tract infection 06/20/2015  . Dysuria 04/10/2015  . Edema 04/06/2015  . Acute encephalopathy 03/29/2015  . Vitamin D deficiency 03/29/2015  . UTI (lower urinary tract infection) 03/21/2015  . Hip fracture ([place]) 03/20/2015  . Chronic atrial fibrillation 02/20/2015  . Hyperlipidemia 02/20/2015  .  Essential hypertension, benign 12/13/2012  . Cardiac arrhythmia 12/13/2012  . Urinary incontinence 12/13/2012  . Hypothyroidism 12/13/2012  . History of depression 12/11/2012    CMP     Component Value Date/Time   NA 144 10/13/2017   K 4.0 10/13/2017   CL 103 01/10/2016 0030   CO2 23 01/10/2016 0030   GLUCOSE 111 (H) 01/10/2016 0030   BUN 37 (A) 10/13/2017   CREATININE 1.3 (A) 10/13/2017   CREATININE 1.26 (H) 01/10/2016 0030   CALCIUM 8.6 (L) 01/10/2016 0030   PROT 6.9 12/01/2015 0506   ALBUMIN 3.6 12/01/2015 0506   AST 16 10/13/2017   ALT 9 10/13/2017   ALKPHOS 68 10/13/2017   BILITOT 0.6 12/01/2015 0506   GFRNONAA 36 (L) 01/10/2016 0030   GFRAA 41 (L) 01/10/2016 0030   Recent Labs    10/13/17  NA 144  K 4.0  BUN 37*  CREATININE 1.3*   Recent Labs    10/13/17  AST 16  ALT 9  ALKPHOS 68   Recent Labs    10/13/17 05/10/18  WBC 5.2 5.1  HGB 10.9* 11.2*  HCT 33* 35*  PLT 218 224   Recent Labs    10/13/17  CHOL 131  LDLCALC 67  TRIG 73   No results found for: MICROALBUR Lab Results  Component Value Date   TSH 0.32 (A) 04/08/2018   Lab Results  Component Value Date   HGBA1C  5.6 10/13/2017   Lab Results  Component Value Date   CHOL 131 10/13/2017   HDL 49 10/13/2017   LDLCALC 67 10/13/2017   TRIG 73 10/13/2017    Significant Diagnostic Results in last 30 days:  No results found.  Assessment and Plan  Unresponsive/Cheyne-Stokes breathing/COVID positive- while making patient ready for comfort care, a nurse went to put oxygen on her nose and she said do not put that on me so apparently she is feeling better.  We will continue supportive care.  Patient son came to the facility to see her because we thought she was going.  Patient's son has multiple medical problems and had concern for getting COVID.  I spoke to him for a while and I do agree with him that I do not wish to see him coming to the facility.  He says his mother sometimes recognizes him and usually does not and is also deaf.  I told him that just because I do not want him to come the facility does not mean he does not love his mother and that we will take care of her and keep him posted-he was very good with this; continue supportive care     Time spent greater than 35 minutes hold [name], MD

## 2018-08-01 ENCOUNTER — Encounter: Payer: Self-pay | Admitting: Internal Medicine

## 2018-08-01 DIAGNOSIS — R063 Periodic breathing: Secondary | ICD-10-CM | POA: Insufficient documentation

## 2018-08-01 DIAGNOSIS — R4189 Other symptoms and signs involving cognitive functions and awareness: Secondary | ICD-10-CM | POA: Insufficient documentation

## 2018-08-08 DEATH — deceased
